# Patient Record
Sex: Male | Born: 1935 | Race: Black or African American | Hispanic: No | Marital: Married | Smoking: Former smoker
Health system: Southern US, Community
[De-identification: ages and names within clinical notes are randomized; demographics above are authoritative.]

## PROBLEM LIST (undated history)

## (undated) DIAGNOSIS — K219 Gastro-esophageal reflux disease without esophagitis: Secondary | ICD-10-CM

## (undated) DIAGNOSIS — R911 Solitary pulmonary nodule: Secondary | ICD-10-CM

## (undated) DIAGNOSIS — Z8619 Personal history of other infectious and parasitic diseases: Secondary | ICD-10-CM

## (undated) DIAGNOSIS — A6001 Herpesviral infection of penis: Secondary | ICD-10-CM

## (undated) DIAGNOSIS — I2510 Atherosclerotic heart disease of native coronary artery without angina pectoris: Secondary | ICD-10-CM

## (undated) DIAGNOSIS — I214 Non-ST elevation (NSTEMI) myocardial infarction: Secondary | ICD-10-CM

## (undated) DIAGNOSIS — N529 Male erectile dysfunction, unspecified: Secondary | ICD-10-CM

## (undated) DIAGNOSIS — J449 Chronic obstructive pulmonary disease, unspecified: Secondary | ICD-10-CM

## (undated) DIAGNOSIS — N183 Chronic kidney disease, stage 3 unspecified: Secondary | ICD-10-CM

## (undated) DIAGNOSIS — M1990 Unspecified osteoarthritis, unspecified site: Secondary | ICD-10-CM

## (undated) DIAGNOSIS — I428 Other cardiomyopathies: Secondary | ICD-10-CM

## (undated) DIAGNOSIS — I5022 Chronic systolic (congestive) heart failure: Secondary | ICD-10-CM

## (undated) DIAGNOSIS — I7121 Aneurysm of the ascending aorta, without rupture: Secondary | ICD-10-CM

## (undated) DIAGNOSIS — F419 Anxiety disorder, unspecified: Secondary | ICD-10-CM

## (undated) DIAGNOSIS — I10 Essential (primary) hypertension: Secondary | ICD-10-CM

## (undated) DIAGNOSIS — F0390 Unspecified dementia without behavioral disturbance: Secondary | ICD-10-CM

## (undated) DIAGNOSIS — C61 Malignant neoplasm of prostate: Secondary | ICD-10-CM

## (undated) DIAGNOSIS — K922 Gastrointestinal hemorrhage, unspecified: Secondary | ICD-10-CM

## (undated) DIAGNOSIS — Z95810 Presence of automatic (implantable) cardiac defibrillator: Secondary | ICD-10-CM

## (undated) DIAGNOSIS — I712 Thoracic aortic aneurysm, without rupture: Secondary | ICD-10-CM

## (undated) HISTORY — DX: Herpesviral infection of penis: A60.01

## (undated) HISTORY — DX: Unspecified dementia, unspecified severity, without behavioral disturbance, psychotic disturbance, mood disturbance, and anxiety: F03.90

## (undated) HISTORY — PX: ANKLE SURGERY: SHX546

## (undated) HISTORY — DX: Anxiety disorder, unspecified: F41.9

## (undated) HISTORY — DX: Other cardiomyopathies: I42.8

## (undated) HISTORY — DX: Chronic obstructive pulmonary disease, unspecified: J44.9

## (undated) HISTORY — DX: Unspecified osteoarthritis, unspecified site: M19.90

## (undated) HISTORY — DX: Male erectile dysfunction, unspecified: N52.9

## (undated) HISTORY — DX: Personal history of other infectious and parasitic diseases: Z86.19

---

## 1998-01-24 ENCOUNTER — Ambulatory Visit (HOSPITAL_BASED_OUTPATIENT_CLINIC_OR_DEPARTMENT_OTHER): Admission: RE | Admit: 1998-01-24 | Discharge: 1998-01-24 | Payer: Self-pay | Admitting: Specialist

## 1999-08-15 LAB — HM COLONOSCOPY: HM Colonoscopy: NORMAL

## 2001-12-29 ENCOUNTER — Encounter: Payer: Self-pay | Admitting: Family Medicine

## 2001-12-29 ENCOUNTER — Ambulatory Visit (HOSPITAL_COMMUNITY): Admission: RE | Admit: 2001-12-29 | Discharge: 2001-12-29 | Payer: Self-pay | Admitting: Family Medicine

## 2001-12-31 ENCOUNTER — Ambulatory Visit (HOSPITAL_COMMUNITY): Admission: RE | Admit: 2001-12-31 | Discharge: 2001-12-31 | Payer: Self-pay | Admitting: Family Medicine

## 2001-12-31 ENCOUNTER — Encounter: Payer: Self-pay | Admitting: Family Medicine

## 2002-01-03 ENCOUNTER — Emergency Department (HOSPITAL_COMMUNITY): Admission: EM | Admit: 2002-01-03 | Discharge: 2002-01-03 | Payer: Self-pay | Admitting: Emergency Medicine

## 2002-01-09 ENCOUNTER — Emergency Department (HOSPITAL_COMMUNITY): Admission: EM | Admit: 2002-01-09 | Discharge: 2002-01-10 | Payer: Self-pay | Admitting: Emergency Medicine

## 2002-06-29 ENCOUNTER — Encounter: Payer: Self-pay | Admitting: Emergency Medicine

## 2002-06-29 ENCOUNTER — Inpatient Hospital Stay (HOSPITAL_COMMUNITY): Admission: EM | Admit: 2002-06-29 | Discharge: 2002-07-02 | Payer: Self-pay | Admitting: Emergency Medicine

## 2002-07-05 ENCOUNTER — Ambulatory Visit (HOSPITAL_COMMUNITY): Admission: RE | Admit: 2002-07-05 | Discharge: 2002-07-05 | Payer: Self-pay | Admitting: Cardiology

## 2003-03-13 ENCOUNTER — Inpatient Hospital Stay (HOSPITAL_COMMUNITY): Admission: EM | Admit: 2003-03-13 | Discharge: 2003-03-15 | Payer: Self-pay | Admitting: Emergency Medicine

## 2003-10-01 ENCOUNTER — Inpatient Hospital Stay (HOSPITAL_COMMUNITY): Admission: EM | Admit: 2003-10-01 | Discharge: 2003-10-04 | Payer: Self-pay | Admitting: Emergency Medicine

## 2003-11-22 ENCOUNTER — Ambulatory Visit: Payer: Self-pay | Admitting: Family Medicine

## 2003-12-13 ENCOUNTER — Ambulatory Visit: Payer: Self-pay | Admitting: *Deleted

## 2004-01-02 ENCOUNTER — Ambulatory Visit: Payer: Self-pay | Admitting: Family Medicine

## 2004-01-27 ENCOUNTER — Ambulatory Visit (HOSPITAL_COMMUNITY): Admission: RE | Admit: 2004-01-27 | Discharge: 2004-01-27 | Payer: Self-pay | Admitting: Family Medicine

## 2004-01-27 ENCOUNTER — Ambulatory Visit: Payer: Self-pay | Admitting: Family Medicine

## 2004-01-31 ENCOUNTER — Ambulatory Visit (HOSPITAL_COMMUNITY): Admission: RE | Admit: 2004-01-31 | Discharge: 2004-01-31 | Payer: Self-pay | Admitting: Family Medicine

## 2004-02-06 ENCOUNTER — Ambulatory Visit: Payer: Self-pay | Admitting: Family Medicine

## 2004-03-07 ENCOUNTER — Ambulatory Visit: Payer: Self-pay | Admitting: Family Medicine

## 2004-03-12 ENCOUNTER — Ambulatory Visit: Payer: Self-pay | Admitting: *Deleted

## 2004-03-14 ENCOUNTER — Ambulatory Visit: Payer: Self-pay | Admitting: *Deleted

## 2004-03-14 ENCOUNTER — Ambulatory Visit (HOSPITAL_COMMUNITY): Admission: RE | Admit: 2004-03-14 | Discharge: 2004-03-14 | Payer: Self-pay | Admitting: *Deleted

## 2004-03-22 ENCOUNTER — Ambulatory Visit: Payer: Self-pay | Admitting: *Deleted

## 2004-04-04 ENCOUNTER — Ambulatory Visit: Payer: Self-pay | Admitting: Family Medicine

## 2004-04-12 ENCOUNTER — Ambulatory Visit: Payer: Self-pay | Admitting: *Deleted

## 2004-04-18 ENCOUNTER — Ambulatory Visit: Payer: Self-pay | Admitting: Family Medicine

## 2004-04-30 ENCOUNTER — Ambulatory Visit (HOSPITAL_COMMUNITY): Admission: RE | Admit: 2004-04-30 | Discharge: 2004-04-30 | Payer: Self-pay | Admitting: Family Medicine

## 2004-05-11 ENCOUNTER — Ambulatory Visit (HOSPITAL_COMMUNITY): Admission: RE | Admit: 2004-05-11 | Discharge: 2004-05-11 | Payer: Self-pay | Admitting: Thoracic Surgery

## 2004-06-06 ENCOUNTER — Ambulatory Visit (HOSPITAL_COMMUNITY): Admission: RE | Admit: 2004-06-06 | Discharge: 2004-06-06 | Payer: Self-pay | Admitting: General Surgery

## 2004-07-19 ENCOUNTER — Ambulatory Visit: Payer: Self-pay | Admitting: Family Medicine

## 2004-08-08 ENCOUNTER — Ambulatory Visit: Payer: Self-pay | Admitting: *Deleted

## 2004-11-12 ENCOUNTER — Ambulatory Visit: Payer: Self-pay | Admitting: Family Medicine

## 2004-11-15 ENCOUNTER — Ambulatory Visit: Payer: Self-pay | Admitting: *Deleted

## 2004-11-26 ENCOUNTER — Ambulatory Visit: Payer: Self-pay | Admitting: Family Medicine

## 2004-11-28 ENCOUNTER — Ambulatory Visit (HOSPITAL_COMMUNITY): Admission: RE | Admit: 2004-11-28 | Discharge: 2004-11-28 | Payer: Self-pay | Admitting: Family Medicine

## 2004-12-31 ENCOUNTER — Ambulatory Visit: Payer: Self-pay | Admitting: Family Medicine

## 2005-01-30 ENCOUNTER — Ambulatory Visit: Payer: Self-pay | Admitting: Family Medicine

## 2005-01-30 ENCOUNTER — Ambulatory Visit (HOSPITAL_COMMUNITY): Admission: RE | Admit: 2005-01-30 | Discharge: 2005-01-30 | Payer: Self-pay | Admitting: Family Medicine

## 2005-01-30 ENCOUNTER — Ambulatory Visit: Payer: Self-pay | Admitting: Internal Medicine

## 2005-02-19 ENCOUNTER — Inpatient Hospital Stay (HOSPITAL_COMMUNITY): Admission: RE | Admit: 2005-02-19 | Discharge: 2005-02-20 | Payer: Self-pay | Admitting: Internal Medicine

## 2005-02-19 ENCOUNTER — Ambulatory Visit: Payer: Self-pay | Admitting: Internal Medicine

## 2005-02-19 HISTORY — PX: INSERTION OF ICD: SHX6689

## 2005-03-11 ENCOUNTER — Ambulatory Visit: Payer: Self-pay

## 2005-03-14 ENCOUNTER — Ambulatory Visit: Payer: Self-pay | Admitting: Family Medicine

## 2005-03-26 ENCOUNTER — Ambulatory Visit: Payer: Self-pay | Admitting: Family Medicine

## 2005-03-29 ENCOUNTER — Ambulatory Visit: Payer: Self-pay | Admitting: Family Medicine

## 2005-04-18 ENCOUNTER — Ambulatory Visit: Payer: Self-pay | Admitting: Family Medicine

## 2005-05-16 ENCOUNTER — Ambulatory Visit: Payer: Self-pay | Admitting: Family Medicine

## 2005-05-17 ENCOUNTER — Ambulatory Visit: Payer: Self-pay | Admitting: Cardiology

## 2005-05-17 ENCOUNTER — Ambulatory Visit: Payer: Self-pay | Admitting: Internal Medicine

## 2005-05-27 ENCOUNTER — Ambulatory Visit: Payer: Self-pay | Admitting: Cardiology

## 2005-05-28 ENCOUNTER — Ambulatory Visit: Payer: Self-pay | Admitting: Internal Medicine

## 2005-05-28 ENCOUNTER — Ambulatory Visit: Payer: Self-pay | Admitting: Cardiology

## 2005-06-11 ENCOUNTER — Ambulatory Visit (HOSPITAL_COMMUNITY): Admission: RE | Admit: 2005-06-11 | Discharge: 2005-06-11 | Payer: Self-pay | Admitting: Thoracic Surgery

## 2005-06-24 ENCOUNTER — Ambulatory Visit: Payer: Self-pay | Admitting: *Deleted

## 2005-07-02 ENCOUNTER — Ambulatory Visit: Payer: Self-pay | Admitting: Internal Medicine

## 2005-07-09 ENCOUNTER — Ambulatory Visit: Payer: Self-pay | Admitting: Family Medicine

## 2005-07-10 ENCOUNTER — Ambulatory Visit (HOSPITAL_COMMUNITY): Admission: RE | Admit: 2005-07-10 | Discharge: 2005-07-10 | Payer: Self-pay | Admitting: Family Medicine

## 2005-07-13 ENCOUNTER — Emergency Department (HOSPITAL_COMMUNITY): Admission: EM | Admit: 2005-07-13 | Discharge: 2005-07-13 | Payer: Self-pay | Admitting: Emergency Medicine

## 2005-08-13 ENCOUNTER — Ambulatory Visit: Payer: Self-pay | Admitting: Family Medicine

## 2005-08-22 ENCOUNTER — Ambulatory Visit: Payer: Self-pay | Admitting: Cardiology

## 2005-08-22 ENCOUNTER — Ambulatory Visit (HOSPITAL_COMMUNITY): Admission: RE | Admit: 2005-08-22 | Discharge: 2005-08-22 | Payer: Self-pay | Admitting: Cardiology

## 2005-08-29 ENCOUNTER — Ambulatory Visit: Payer: Self-pay | Admitting: Cardiology

## 2005-10-08 ENCOUNTER — Ambulatory Visit: Payer: Self-pay | Admitting: Family Medicine

## 2005-10-09 ENCOUNTER — Ambulatory Visit (HOSPITAL_COMMUNITY): Admission: RE | Admit: 2005-10-09 | Discharge: 2005-10-09 | Payer: Self-pay | Admitting: Family Medicine

## 2005-10-17 ENCOUNTER — Ambulatory Visit: Payer: Self-pay | Admitting: Family Medicine

## 2005-10-25 ENCOUNTER — Ambulatory Visit (HOSPITAL_COMMUNITY): Admission: RE | Admit: 2005-10-25 | Discharge: 2005-10-25 | Payer: Self-pay | Admitting: Internal Medicine

## 2005-10-25 ENCOUNTER — Ambulatory Visit: Payer: Self-pay | Admitting: Internal Medicine

## 2005-10-28 ENCOUNTER — Ambulatory Visit: Payer: Self-pay | Admitting: Cardiology

## 2005-10-29 ENCOUNTER — Ambulatory Visit (HOSPITAL_COMMUNITY): Admission: RE | Admit: 2005-10-29 | Discharge: 2005-10-29 | Payer: Self-pay | Admitting: Family Medicine

## 2005-11-07 ENCOUNTER — Ambulatory Visit: Payer: Self-pay | Admitting: Family Medicine

## 2005-11-29 ENCOUNTER — Ambulatory Visit: Payer: Self-pay

## 2005-12-11 ENCOUNTER — Ambulatory Visit: Payer: Self-pay | Admitting: Cardiology

## 2005-12-12 ENCOUNTER — Ambulatory Visit: Payer: Self-pay | Admitting: Internal Medicine

## 2006-03-05 ENCOUNTER — Ambulatory Visit: Payer: Self-pay | Admitting: Internal Medicine

## 2006-03-11 ENCOUNTER — Ambulatory Visit: Payer: Self-pay | Admitting: Family Medicine

## 2006-04-02 ENCOUNTER — Encounter: Payer: Self-pay | Admitting: Family Medicine

## 2006-04-02 LAB — CONVERTED CEMR LAB
ALT: 14 units/L (ref 0–53)
AST: 16 units/L (ref 0–37)
Albumin: 4.1 g/dL (ref 3.5–5.2)
Alkaline Phosphatase: 118 units/L — ABNORMAL HIGH (ref 39–117)
Calcium: 9.6 mg/dL (ref 8.4–10.5)
Chloride: 109 meq/L (ref 96–112)
Cholesterol: 139 mg/dL (ref 0–200)
Creatinine, Ser: 1.1 mg/dL (ref 0.40–1.50)
HCT: 43.3 % (ref 39.0–52.0)
HDL: 31 mg/dL — ABNORMAL LOW (ref >39)
Indirect Bilirubin: 0.7 mg/dL (ref 0.0–0.9)
Lymphocytes Relative: 30 % (ref 12–46)
Lymphs Abs: 2 10*3/uL (ref 0.7–3.3)
Monocytes Relative: 8 % (ref 3–11)
Neutrophils Relative %: 59 % (ref 43–77)
Platelets: 282 10*3/uL (ref 150–400)
Potassium: 4.3 meq/L (ref 3.5–5.3)
RBC: 4.9 M/uL (ref 4.22–5.81)
Sodium: 142 meq/L (ref 135–145)
Total Protein: 7 g/dL (ref 6.0–8.3)
Triglycerides: 156 mg/dL — ABNORMAL HIGH (ref <150)
WBC: 6.8 10*3/uL (ref 4.0–10.5)

## 2006-04-09 ENCOUNTER — Ambulatory Visit: Payer: Self-pay | Admitting: *Deleted

## 2006-04-24 ENCOUNTER — Ambulatory Visit: Payer: Self-pay | Admitting: Cardiology

## 2006-06-18 ENCOUNTER — Ambulatory Visit: Payer: Self-pay | Admitting: Cardiology

## 2006-07-02 ENCOUNTER — Ambulatory Visit: Payer: Self-pay | Admitting: Cardiology

## 2006-07-08 ENCOUNTER — Ambulatory Visit: Payer: Self-pay | Admitting: Cardiology

## 2006-07-14 ENCOUNTER — Emergency Department (HOSPITAL_COMMUNITY): Admission: EM | Admit: 2006-07-14 | Discharge: 2006-07-14 | Payer: Self-pay | Admitting: Emergency Medicine

## 2006-07-16 ENCOUNTER — Ambulatory Visit: Payer: Self-pay | Admitting: Cardiovascular Disease

## 2006-07-23 ENCOUNTER — Ambulatory Visit: Payer: Self-pay | Admitting: Cardiology

## 2006-08-13 ENCOUNTER — Ambulatory Visit: Payer: Self-pay | Admitting: Family Medicine

## 2006-08-20 ENCOUNTER — Ambulatory Visit: Payer: Self-pay | Admitting: Cardiology

## 2006-08-28 ENCOUNTER — Ambulatory Visit: Payer: Self-pay

## 2006-09-02 ENCOUNTER — Ambulatory Visit: Payer: Self-pay | Admitting: Family Medicine

## 2006-09-24 ENCOUNTER — Ambulatory Visit: Payer: Self-pay | Admitting: Cardiology

## 2006-09-29 ENCOUNTER — Ambulatory Visit: Payer: Self-pay | Admitting: Orthopedic Surgery

## 2006-10-22 ENCOUNTER — Ambulatory Visit: Payer: Self-pay | Admitting: Cardiovascular Disease

## 2006-11-26 ENCOUNTER — Ambulatory Visit: Payer: Self-pay | Admitting: Cardiovascular Disease

## 2006-12-03 ENCOUNTER — Ambulatory Visit: Payer: Self-pay | Admitting: Internal Medicine

## 2006-12-04 ENCOUNTER — Ambulatory Visit: Payer: Self-pay | Admitting: Family Medicine

## 2006-12-31 ENCOUNTER — Ambulatory Visit: Payer: Self-pay | Admitting: Cardiology

## 2007-01-19 ENCOUNTER — Encounter: Payer: Self-pay | Admitting: Orthopedic Surgery

## 2007-01-19 ENCOUNTER — Ambulatory Visit (HOSPITAL_COMMUNITY): Admission: RE | Admit: 2007-01-19 | Discharge: 2007-01-19 | Payer: Self-pay | Admitting: Family Medicine

## 2007-01-19 ENCOUNTER — Ambulatory Visit: Payer: Self-pay | Admitting: Family Medicine

## 2007-01-21 ENCOUNTER — Ambulatory Visit: Payer: Self-pay | Admitting: Cardiology

## 2007-02-04 ENCOUNTER — Ambulatory Visit: Payer: Self-pay | Admitting: Orthopedic Surgery

## 2007-02-04 ENCOUNTER — Ambulatory Visit: Payer: Self-pay | Admitting: Cardiology

## 2007-02-05 ENCOUNTER — Encounter: Payer: Self-pay | Admitting: Orthopedic Surgery

## 2007-02-19 ENCOUNTER — Ambulatory Visit: Payer: Self-pay | Admitting: Cardiology

## 2007-03-04 ENCOUNTER — Ambulatory Visit: Payer: Self-pay | Admitting: Cardiology

## 2007-03-10 ENCOUNTER — Telehealth: Payer: Self-pay | Admitting: Orthopedic Surgery

## 2007-03-18 ENCOUNTER — Ambulatory Visit: Payer: Self-pay | Admitting: Cardiology

## 2007-04-14 ENCOUNTER — Ambulatory Visit: Payer: Self-pay | Admitting: Cardiology

## 2007-04-14 ENCOUNTER — Ambulatory Visit: Payer: Self-pay | Admitting: Family Medicine

## 2007-04-22 ENCOUNTER — Encounter: Payer: Self-pay | Admitting: Family Medicine

## 2007-04-22 LAB — CONVERTED CEMR LAB
BUN: 19 mg/dL (ref 6–23)
Basophils Absolute: 0 10*3/uL (ref 0.0–0.1)
Basophils Relative: 0 % (ref 0–1)
Chloride: 107 meq/L (ref 96–112)
Cholesterol: 140 mg/dL (ref 0–200)
Creatinine, Ser: 1.18 mg/dL (ref 0.40–1.50)
Eosinophils Absolute: 0.2 10*3/uL (ref 0.0–0.7)
HDL: 30 mg/dL — ABNORMAL LOW (ref >39)
LDL Cholesterol: 75 mg/dL (ref 0–99)
MCHC: 31.4 g/dL (ref 30.0–36.0)
MCV: 91.7 fL (ref 78.0–100.0)
Monocytes Absolute: 0.5 10*3/uL (ref 0.1–1.0)
Neutro Abs: 3.8 10*3/uL (ref 1.7–7.7)
Neutrophils Relative %: 60 % (ref 43–77)
Potassium: 3.9 meq/L (ref 3.5–5.3)
RDW: 14.9 % (ref 11.5–15.5)
Triglycerides: 173 mg/dL — ABNORMAL HIGH (ref <150)

## 2007-05-05 ENCOUNTER — Ambulatory Visit: Payer: Self-pay | Admitting: Cardiology

## 2007-05-20 ENCOUNTER — Ambulatory Visit: Payer: Self-pay | Admitting: Cardiology

## 2007-06-17 ENCOUNTER — Ambulatory Visit: Payer: Self-pay | Admitting: Cardiology

## 2007-07-14 ENCOUNTER — Ambulatory Visit: Payer: Self-pay | Admitting: Family Medicine

## 2007-07-14 ENCOUNTER — Ambulatory Visit: Payer: Self-pay | Admitting: Cardiovascular Disease

## 2007-07-20 DIAGNOSIS — I428 Other cardiomyopathies: Secondary | ICD-10-CM

## 2007-07-22 ENCOUNTER — Ambulatory Visit: Payer: Self-pay | Admitting: Internal Medicine

## 2007-08-12 ENCOUNTER — Ambulatory Visit: Payer: Self-pay | Admitting: Cardiovascular Disease

## 2007-08-25 ENCOUNTER — Encounter: Payer: Self-pay | Admitting: Family Medicine

## 2007-09-04 ENCOUNTER — Encounter: Payer: Self-pay | Admitting: Family Medicine

## 2007-09-07 ENCOUNTER — Ambulatory Visit: Payer: Self-pay | Admitting: Family Medicine

## 2007-09-09 ENCOUNTER — Ambulatory Visit: Payer: Self-pay | Admitting: Cardiology

## 2007-09-12 ENCOUNTER — Ambulatory Visit: Payer: Self-pay | Admitting: Cardiology

## 2007-09-12 ENCOUNTER — Inpatient Hospital Stay (HOSPITAL_COMMUNITY): Admission: EM | Admit: 2007-09-12 | Discharge: 2007-09-15 | Payer: Self-pay | Admitting: Emergency Medicine

## 2007-09-14 ENCOUNTER — Encounter (INDEPENDENT_AMBULATORY_CARE_PROVIDER_SITE_OTHER): Payer: Self-pay | Admitting: *Deleted

## 2007-09-16 ENCOUNTER — Encounter: Payer: Self-pay | Admitting: Family Medicine

## 2007-09-22 ENCOUNTER — Encounter: Payer: Self-pay | Admitting: Family Medicine

## 2007-09-23 ENCOUNTER — Ambulatory Visit: Payer: Self-pay | Admitting: Cardiology

## 2007-09-24 ENCOUNTER — Ambulatory Visit: Payer: Self-pay | Admitting: Family Medicine

## 2007-09-25 LAB — CONVERTED CEMR LAB
BUN: 35 mg/dL — ABNORMAL HIGH (ref 6–23)
CO2: 24 meq/L (ref 19–32)
Glucose, Bld: 104 mg/dL — ABNORMAL HIGH (ref 70–99)
Potassium: 4.1 meq/L (ref 3.5–5.3)
Sodium: 137 meq/L (ref 135–145)

## 2007-09-29 ENCOUNTER — Encounter: Payer: Self-pay | Admitting: Family Medicine

## 2007-09-30 ENCOUNTER — Ambulatory Visit: Payer: Self-pay | Admitting: Family Medicine

## 2007-09-30 ENCOUNTER — Ambulatory Visit (HOSPITAL_COMMUNITY): Admission: RE | Admit: 2007-09-30 | Discharge: 2007-09-30 | Payer: Self-pay | Admitting: Family Medicine

## 2007-09-30 LAB — CONVERTED CEMR LAB
Basophils Absolute: 0 10*3/uL (ref 0.0–0.1)
Basophils Relative: 1 % (ref 0–1)
Blood in Urine, dipstick: NEGATIVE
CO2: 23 meq/L (ref 19–32)
Calcium: 9.8 mg/dL (ref 8.4–10.5)
Creatinine, Ser: 1.76 mg/dL — ABNORMAL HIGH (ref 0.40–1.50)
Eosinophils Absolute: 0.2 10*3/uL (ref 0.0–0.7)
Eosinophils Relative: 3 % (ref 0–5)
Glucose, Urine, Semiquant: NEGATIVE
HCT: 43.5 % (ref 39.0–52.0)
Hemoglobin: 14.1 g/dL (ref 13.0–17.0)
Lymphocytes Relative: 24 % (ref 12–46)
MCHC: 32.4 g/dL (ref 30.0–36.0)
MCV: 88.1 fL (ref 78.0–100.0)
Monocytes Absolute: 0.5 10*3/uL (ref 0.1–1.0)
Nitrite: NEGATIVE
Platelets: 294 10*3/uL (ref 150–400)
Protein, U semiquant: NEGATIVE
RDW: 14.2 % (ref 11.5–15.5)
Sodium: 140 meq/L (ref 135–145)
Urobilinogen, UA: 0.2
WBC Urine, dipstick: NEGATIVE

## 2007-10-02 ENCOUNTER — Ambulatory Visit (HOSPITAL_COMMUNITY): Admission: RE | Admit: 2007-10-02 | Discharge: 2007-10-02 | Payer: Self-pay | Admitting: Family Medicine

## 2007-10-05 ENCOUNTER — Emergency Department (HOSPITAL_COMMUNITY): Admission: EM | Admit: 2007-10-05 | Discharge: 2007-10-05 | Payer: Self-pay | Admitting: Emergency Medicine

## 2007-10-13 ENCOUNTER — Ambulatory Visit: Payer: Self-pay | Admitting: Family Medicine

## 2007-10-14 LAB — CONVERTED CEMR LAB
CO2: 23 meq/L (ref 19–32)
Chloride: 101 meq/L (ref 96–112)
Creatinine, Ser: 1.51 mg/dL — ABNORMAL HIGH (ref 0.40–1.50)
Sodium: 138 meq/L (ref 135–145)

## 2007-10-21 ENCOUNTER — Ambulatory Visit: Payer: Self-pay | Admitting: Cardiology

## 2007-10-29 ENCOUNTER — Telehealth: Payer: Self-pay | Admitting: Family Medicine

## 2007-11-16 ENCOUNTER — Telehealth: Payer: Self-pay | Admitting: Orthopedic Surgery

## 2007-11-18 ENCOUNTER — Encounter: Payer: Self-pay | Admitting: Family Medicine

## 2007-11-18 ENCOUNTER — Ambulatory Visit: Payer: Self-pay | Admitting: Cardiology

## 2007-11-26 ENCOUNTER — Ambulatory Visit: Payer: Self-pay | Admitting: Orthopedic Surgery

## 2007-11-26 DIAGNOSIS — M19019 Primary osteoarthritis, unspecified shoulder: Secondary | ICD-10-CM | POA: Insufficient documentation

## 2007-12-03 ENCOUNTER — Telehealth: Payer: Self-pay | Admitting: Orthopedic Surgery

## 2007-12-14 ENCOUNTER — Ambulatory Visit: Payer: Self-pay | Admitting: Family Medicine

## 2007-12-17 ENCOUNTER — Ambulatory Visit: Payer: Self-pay | Admitting: Cardiology

## 2007-12-21 ENCOUNTER — Encounter: Payer: Self-pay | Admitting: Orthopedic Surgery

## 2007-12-21 ENCOUNTER — Emergency Department (HOSPITAL_COMMUNITY): Admission: EM | Admit: 2007-12-21 | Discharge: 2007-12-21 | Payer: Self-pay | Admitting: Emergency Medicine

## 2007-12-25 ENCOUNTER — Encounter: Payer: Self-pay | Admitting: Family Medicine

## 2007-12-28 ENCOUNTER — Telehealth: Payer: Self-pay | Admitting: Orthopedic Surgery

## 2007-12-30 ENCOUNTER — Ambulatory Visit: Payer: Self-pay | Admitting: Cardiology

## 2008-01-19 ENCOUNTER — Ambulatory Visit: Payer: Self-pay | Admitting: Family Medicine

## 2008-01-20 ENCOUNTER — Encounter: Payer: Self-pay | Admitting: Family Medicine

## 2008-01-21 LAB — CONVERTED CEMR LAB
BUN: 38 mg/dL — ABNORMAL HIGH (ref 6–23)
CO2: 21 meq/L (ref 19–32)
Chloride: 107 meq/L (ref 96–112)
Potassium: 4.4 meq/L (ref 3.5–5.3)

## 2008-01-27 ENCOUNTER — Ambulatory Visit: Payer: Self-pay | Admitting: Cardiology

## 2008-01-28 ENCOUNTER — Ambulatory Visit (HOSPITAL_COMMUNITY): Payer: Self-pay | Admitting: Psychology

## 2008-02-29 ENCOUNTER — Ambulatory Visit: Payer: Self-pay | Admitting: Cardiology

## 2008-02-29 ENCOUNTER — Ambulatory Visit: Payer: Self-pay

## 2008-03-01 ENCOUNTER — Ambulatory Visit: Payer: Self-pay | Admitting: Cardiology

## 2008-03-03 ENCOUNTER — Ambulatory Visit (HOSPITAL_COMMUNITY): Admission: RE | Admit: 2008-03-03 | Discharge: 2008-03-03 | Payer: Self-pay | Admitting: Family Medicine

## 2008-03-23 ENCOUNTER — Ambulatory Visit: Payer: Self-pay | Admitting: Cardiology

## 2008-03-28 ENCOUNTER — Ambulatory Visit: Payer: Self-pay | Admitting: Orthopedic Surgery

## 2008-04-01 ENCOUNTER — Encounter: Payer: Self-pay | Admitting: Family Medicine

## 2008-04-07 ENCOUNTER — Encounter: Payer: Self-pay | Admitting: Family Medicine

## 2008-04-12 ENCOUNTER — Encounter: Payer: Self-pay | Admitting: Internal Medicine

## 2008-04-13 ENCOUNTER — Encounter: Payer: Self-pay | Admitting: Family Medicine

## 2008-04-25 ENCOUNTER — Ambulatory Visit: Payer: Self-pay | Admitting: Orthopedic Surgery

## 2008-05-03 ENCOUNTER — Encounter: Payer: Self-pay | Admitting: Family Medicine

## 2008-05-16 ENCOUNTER — Ambulatory Visit: Payer: Self-pay | Admitting: Family Medicine

## 2008-05-17 LAB — CONVERTED CEMR LAB
PSA: 3.45 ng/mL (ref 0.10–4.00)
Uric Acid, Serum: 8 mg/dL — ABNORMAL HIGH (ref 4.0–7.8)

## 2008-05-18 ENCOUNTER — Ambulatory Visit: Payer: Self-pay | Admitting: Cardiology

## 2008-05-20 DIAGNOSIS — M109 Gout, unspecified: Secondary | ICD-10-CM

## 2008-06-07 ENCOUNTER — Ambulatory Visit (HOSPITAL_COMMUNITY): Payer: Self-pay | Admitting: Psychology

## 2008-06-07 ENCOUNTER — Encounter (INDEPENDENT_AMBULATORY_CARE_PROVIDER_SITE_OTHER): Payer: Self-pay | Admitting: *Deleted

## 2008-06-15 ENCOUNTER — Ambulatory Visit: Payer: Self-pay | Admitting: Cardiology

## 2008-06-30 ENCOUNTER — Encounter: Payer: Self-pay | Admitting: Orthopedic Surgery

## 2008-07-03 ENCOUNTER — Encounter: Payer: Self-pay | Admitting: Family Medicine

## 2008-07-12 ENCOUNTER — Telehealth: Payer: Self-pay | Admitting: Family Medicine

## 2008-07-13 ENCOUNTER — Ambulatory Visit: Payer: Self-pay | Admitting: Cardiology

## 2008-07-20 ENCOUNTER — Encounter: Payer: Self-pay | Admitting: Internal Medicine

## 2008-07-20 ENCOUNTER — Ambulatory Visit: Payer: Self-pay | Admitting: Internal Medicine

## 2008-07-20 DIAGNOSIS — Z95810 Presence of automatic (implantable) cardiac defibrillator: Secondary | ICD-10-CM

## 2008-07-20 DIAGNOSIS — N62 Hypertrophy of breast: Secondary | ICD-10-CM | POA: Insufficient documentation

## 2008-07-20 HISTORY — DX: Presence of automatic (implantable) cardiac defibrillator: Z95.810

## 2008-07-27 ENCOUNTER — Telehealth: Payer: Self-pay | Admitting: Family Medicine

## 2008-08-04 ENCOUNTER — Encounter: Payer: Self-pay | Admitting: Family Medicine

## 2008-08-10 ENCOUNTER — Ambulatory Visit: Payer: Self-pay | Admitting: Cardiology

## 2008-08-10 ENCOUNTER — Encounter (INDEPENDENT_AMBULATORY_CARE_PROVIDER_SITE_OTHER): Payer: Self-pay | Admitting: *Deleted

## 2008-08-22 ENCOUNTER — Ambulatory Visit (HOSPITAL_COMMUNITY): Payer: Self-pay | Admitting: Psychology

## 2008-08-30 ENCOUNTER — Encounter: Payer: Self-pay | Admitting: Family Medicine

## 2008-09-04 ENCOUNTER — Encounter: Payer: Self-pay | Admitting: Family Medicine

## 2008-09-20 ENCOUNTER — Ambulatory Visit: Payer: Self-pay | Admitting: Family Medicine

## 2008-10-03 ENCOUNTER — Encounter: Payer: Self-pay | Admitting: Cardiology

## 2008-10-04 ENCOUNTER — Encounter: Payer: Self-pay | Admitting: Family Medicine

## 2008-11-15 ENCOUNTER — Telehealth: Payer: Self-pay | Admitting: Family Medicine

## 2008-12-20 ENCOUNTER — Ambulatory Visit: Payer: Self-pay | Admitting: Family Medicine

## 2008-12-20 DIAGNOSIS — G4700 Insomnia, unspecified: Secondary | ICD-10-CM

## 2009-01-02 ENCOUNTER — Ambulatory Visit: Payer: Self-pay | Admitting: Family Medicine

## 2009-01-02 ENCOUNTER — Telehealth: Payer: Self-pay | Admitting: Family Medicine

## 2009-01-04 ENCOUNTER — Encounter: Payer: Self-pay | Admitting: Family Medicine

## 2009-01-18 ENCOUNTER — Telehealth: Payer: Self-pay | Admitting: Family Medicine

## 2009-01-18 LAB — CONVERTED CEMR LAB
AST: 19 units/L (ref 0–37)
Albumin: 4.3 g/dL (ref 3.5–5.2)
Alkaline Phosphatase: 113 units/L (ref 39–117)
HDL: 24 mg/dL — ABNORMAL LOW (ref >39)
RPR Ser Ql: REACTIVE — AB
Total CHOL/HDL Ratio: 5.1
Total Protein: 7.1 g/dL (ref 6.0–8.3)
Triglycerides: 198 mg/dL — ABNORMAL HIGH (ref <150)

## 2009-02-15 ENCOUNTER — Encounter: Payer: Self-pay | Admitting: Family Medicine

## 2009-05-04 ENCOUNTER — Ambulatory Visit: Payer: Self-pay | Admitting: Family Medicine

## 2009-05-09 ENCOUNTER — Telehealth: Payer: Self-pay | Admitting: Family Medicine

## 2009-05-19 LAB — CONVERTED CEMR LAB
Alkaline Phosphatase: 101 units/L (ref 39–117)
BUN: 17 mg/dL (ref 6–23)
Basophils Absolute: 0 10*3/uL (ref 0.0–0.1)
Basophils Relative: 1 % (ref 0–1)
Calcium: 9.2 mg/dL (ref 8.4–10.5)
Chloride: 105 meq/L (ref 96–112)
Cholesterol: 159 mg/dL (ref 0–200)
Creatinine, Ser: 1.34 mg/dL (ref 0.40–1.50)
Eosinophils Absolute: 0.2 10*3/uL (ref 0.0–0.7)
Eosinophils Relative: 4 % (ref 0–5)
HCT: 37.3 % — ABNORMAL LOW (ref 39.0–52.0)
Indirect Bilirubin: 0.6 mg/dL (ref 0.0–0.9)
LDL Cholesterol: 105 mg/dL — ABNORMAL HIGH (ref 0–99)
MCHC: 32.2 g/dL (ref 30.0–36.0)
MCV: 89.4 fL (ref 78.0–100.0)
Platelets: 287 10*3/uL (ref 150–400)
RDW: 14.4 % (ref 11.5–15.5)
Total Protein: 6.7 g/dL (ref 6.0–8.3)
Triglycerides: 107 mg/dL (ref <150)
Vit D, 25-Hydroxy: 23 ng/mL — ABNORMAL LOW (ref 30–89)

## 2009-05-23 ENCOUNTER — Encounter: Payer: Self-pay | Admitting: Family Medicine

## 2009-05-29 ENCOUNTER — Ambulatory Visit (HOSPITAL_COMMUNITY): Admission: RE | Admit: 2009-05-29 | Discharge: 2009-05-29 | Payer: Self-pay | Admitting: Neurology

## 2009-07-24 ENCOUNTER — Ambulatory Visit: Payer: Self-pay | Admitting: Internal Medicine

## 2009-07-26 ENCOUNTER — Encounter: Payer: Self-pay | Admitting: Internal Medicine

## 2009-08-31 ENCOUNTER — Ambulatory Visit: Payer: Self-pay | Admitting: Family Medicine

## 2009-08-31 ENCOUNTER — Inpatient Hospital Stay (HOSPITAL_COMMUNITY): Admission: EM | Admit: 2009-08-31 | Discharge: 2009-09-02 | Payer: Self-pay | Admitting: Emergency Medicine

## 2009-08-31 DIAGNOSIS — J209 Acute bronchitis, unspecified: Secondary | ICD-10-CM

## 2009-09-02 ENCOUNTER — Encounter: Payer: Self-pay | Admitting: Family Medicine

## 2009-09-05 ENCOUNTER — Ambulatory Visit: Payer: Self-pay | Admitting: Cardiology

## 2009-09-05 ENCOUNTER — Inpatient Hospital Stay (HOSPITAL_COMMUNITY): Admission: EM | Admit: 2009-09-05 | Discharge: 2009-09-09 | Payer: Self-pay | Admitting: Emergency Medicine

## 2009-09-06 ENCOUNTER — Encounter: Payer: Self-pay | Admitting: Cardiology

## 2009-09-14 ENCOUNTER — Encounter (INDEPENDENT_AMBULATORY_CARE_PROVIDER_SITE_OTHER): Payer: Self-pay | Admitting: *Deleted

## 2009-09-18 ENCOUNTER — Emergency Department (HOSPITAL_COMMUNITY): Admission: EM | Admit: 2009-09-18 | Discharge: 2009-09-18 | Payer: Self-pay | Admitting: Emergency Medicine

## 2009-09-21 ENCOUNTER — Ambulatory Visit: Payer: Self-pay | Admitting: Family Medicine

## 2009-09-21 ENCOUNTER — Telehealth: Payer: Self-pay | Admitting: Family Medicine

## 2009-09-25 ENCOUNTER — Encounter: Payer: Self-pay | Admitting: Family Medicine

## 2009-09-26 ENCOUNTER — Encounter: Payer: Self-pay | Admitting: Family Medicine

## 2009-09-27 ENCOUNTER — Encounter: Payer: Self-pay | Admitting: Family Medicine

## 2009-09-28 ENCOUNTER — Encounter: Payer: Self-pay | Admitting: Family Medicine

## 2009-09-28 ENCOUNTER — Telehealth: Payer: Self-pay | Admitting: Family Medicine

## 2009-09-28 DIAGNOSIS — J9600 Acute respiratory failure, unspecified whether with hypoxia or hypercapnia: Secondary | ICD-10-CM

## 2009-09-28 LAB — CONVERTED CEMR LAB
CO2: 28 meq/L (ref 19–32)
Chloride: 103 meq/L (ref 96–112)
Creatinine, Ser: 1.45 mg/dL (ref 0.40–1.50)

## 2009-09-29 ENCOUNTER — Encounter: Payer: Self-pay | Admitting: Family Medicine

## 2009-09-30 DIAGNOSIS — G4734 Idiopathic sleep related nonobstructive alveolar hypoventilation: Secondary | ICD-10-CM | POA: Insufficient documentation

## 2009-10-02 ENCOUNTER — Encounter: Payer: Self-pay | Admitting: Family Medicine

## 2009-10-03 ENCOUNTER — Ambulatory Visit (HOSPITAL_COMMUNITY): Admission: RE | Admit: 2009-10-03 | Discharge: 2009-10-03 | Payer: Self-pay | Admitting: Family Medicine

## 2009-10-03 ENCOUNTER — Encounter: Payer: Self-pay | Admitting: Family Medicine

## 2009-10-04 ENCOUNTER — Telehealth: Payer: Self-pay | Admitting: Family Medicine

## 2009-10-05 ENCOUNTER — Encounter: Payer: Self-pay | Admitting: Adult Health

## 2009-10-05 ENCOUNTER — Encounter: Payer: Self-pay | Admitting: Family Medicine

## 2009-10-05 ENCOUNTER — Ambulatory Visit: Payer: Self-pay | Admitting: Cardiology

## 2009-10-19 LAB — CONVERTED CEMR LAB
BUN: 32 mg/dL — ABNORMAL HIGH (ref 6–23)
CO2: 25 meq/L (ref 19–32)
Chloride: 102 meq/L (ref 96–112)
Creatinine, Ser: 1.54 mg/dL — ABNORMAL HIGH (ref 0.40–1.50)

## 2009-10-23 ENCOUNTER — Ambulatory Visit: Payer: Self-pay | Admitting: Family Medicine

## 2009-10-24 ENCOUNTER — Ambulatory Visit: Payer: Self-pay | Admitting: Family Medicine

## 2009-11-14 ENCOUNTER — Encounter: Payer: Self-pay | Admitting: Family Medicine

## 2009-11-28 ENCOUNTER — Ambulatory Visit: Payer: Self-pay | Admitting: Internal Medicine

## 2009-11-28 DIAGNOSIS — I5022 Chronic systolic (congestive) heart failure: Secondary | ICD-10-CM

## 2009-12-01 ENCOUNTER — Encounter: Payer: Self-pay | Admitting: Internal Medicine

## 2009-12-01 ENCOUNTER — Telehealth (INDEPENDENT_AMBULATORY_CARE_PROVIDER_SITE_OTHER): Payer: Self-pay

## 2009-12-12 ENCOUNTER — Encounter: Payer: Self-pay | Admitting: Emergency Medicine

## 2009-12-12 ENCOUNTER — Inpatient Hospital Stay (HOSPITAL_COMMUNITY)
Admission: EM | Admit: 2009-12-12 | Discharge: 2009-12-14 | Payer: Self-pay | Source: Home / Self Care | Admitting: Cardiovascular Disease

## 2009-12-12 ENCOUNTER — Ambulatory Visit: Payer: Self-pay | Admitting: Internal Medicine

## 2009-12-13 LAB — CONVERTED CEMR LAB
BUN: 21 mg/dL (ref 6–23)
Basophils Absolute: 0 10*3/uL (ref 0.0–0.1)
Creatinine, Ser: 1.31 mg/dL (ref 0.40–1.50)
Glucose, Bld: 101 mg/dL — ABNORMAL HIGH (ref 70–99)
Hemoglobin: 12.2 g/dL — ABNORMAL LOW (ref 13.0–17.0)
INR: 1.12 (ref <1.50)
Lymphocytes Relative: 22 % (ref 12–46)
Monocytes Absolute: 0.3 10*3/uL (ref 0.1–1.0)
Monocytes Relative: 6 % (ref 3–12)
Neutro Abs: 3.3 10*3/uL (ref 1.7–7.7)
Potassium: 3.9 meq/L (ref 3.5–5.3)
RBC: 4.27 M/uL (ref 4.22–5.81)
WBC: 4.9 10*3/uL (ref 4.0–10.5)
aPTT: 34 s (ref 24–37)

## 2009-12-14 HISTORY — PX: OTHER SURGICAL HISTORY: SHX169

## 2009-12-20 ENCOUNTER — Encounter: Payer: Self-pay | Admitting: Internal Medicine

## 2009-12-21 LAB — CONVERTED CEMR LAB
BUN: 26 mg/dL — ABNORMAL HIGH (ref 6–23)
Calcium: 9.7 mg/dL (ref 8.4–10.5)
Glucose, Bld: 103 mg/dL — ABNORMAL HIGH (ref 70–99)
Sodium: 141 meq/L (ref 135–145)

## 2009-12-28 ENCOUNTER — Telehealth (INDEPENDENT_AMBULATORY_CARE_PROVIDER_SITE_OTHER): Payer: Self-pay | Admitting: *Deleted

## 2009-12-29 ENCOUNTER — Encounter: Payer: Self-pay | Admitting: Internal Medicine

## 2009-12-29 ENCOUNTER — Ambulatory Visit: Payer: Self-pay | Admitting: Cardiology

## 2009-12-29 DIAGNOSIS — L988 Other specified disorders of the skin and subcutaneous tissue: Secondary | ICD-10-CM

## 2010-01-02 ENCOUNTER — Ambulatory Visit: Payer: Self-pay | Admitting: Internal Medicine

## 2010-01-18 ENCOUNTER — Ambulatory Visit
Admission: RE | Admit: 2010-01-18 | Discharge: 2010-01-18 | Payer: Self-pay | Source: Home / Self Care | Attending: Internal Medicine | Admitting: Internal Medicine

## 2010-01-25 ENCOUNTER — Ambulatory Visit: Admit: 2010-01-25 | Payer: Self-pay | Admitting: Family Medicine

## 2010-02-03 ENCOUNTER — Encounter: Payer: Self-pay | Admitting: Thoracic Surgery

## 2010-02-04 ENCOUNTER — Encounter: Payer: Self-pay | Admitting: Neurology

## 2010-02-04 ENCOUNTER — Encounter: Payer: Self-pay | Admitting: Thoracic Surgery

## 2010-02-04 ENCOUNTER — Encounter: Payer: Self-pay | Admitting: Family Medicine

## 2010-02-07 ENCOUNTER — Other Ambulatory Visit (HOSPITAL_COMMUNITY): Payer: Self-pay | Admitting: Pulmonary Disease

## 2010-02-07 ENCOUNTER — Encounter: Payer: Self-pay | Admitting: Family Medicine

## 2010-02-07 DIAGNOSIS — R1907 Generalized intra-abdominal and pelvic swelling, mass and lump: Secondary | ICD-10-CM

## 2010-02-14 ENCOUNTER — Ambulatory Visit (HOSPITAL_COMMUNITY)
Admission: RE | Admit: 2010-02-14 | Discharge: 2010-02-14 | Disposition: A | Discharge status: Home / Self Care | Payer: Medicare HMO | Source: Ambulatory Visit | Attending: Pulmonary Disease | Admitting: Pulmonary Disease

## 2010-02-14 DIAGNOSIS — R1900 Intra-abdominal and pelvic swelling, mass and lump, unspecified site: Secondary | ICD-10-CM | POA: Insufficient documentation

## 2010-02-14 DIAGNOSIS — R1907 Generalized intra-abdominal and pelvic swelling, mass and lump: Secondary | ICD-10-CM

## 2010-02-14 DIAGNOSIS — R109 Unspecified abdominal pain: Secondary | ICD-10-CM | POA: Insufficient documentation

## 2010-02-14 DIAGNOSIS — Q619 Cystic kidney disease, unspecified: Secondary | ICD-10-CM | POA: Insufficient documentation

## 2010-02-15 NOTE — Miscellaneous (Signed)
Summary: Device change out  Clinical Lists Changes  Observations: Added new observation of ICD IMPL DTE: 12/13/2009 (12/20/2009 12:55) Added new observation of ICD SERL#: [id number] H (12/20/2009 12:55) Added new observation of ICD MODL#: [id number] (12/20/2009 91:47) Added new observation of ICDEXPLCOMM: 12/13/09 Medtronic 7304/[id number] H explanted (12/20/2009 12:55)       ICD Specifications Following MD:  Lewayne Bunting, MD     ICD Vendor:  Medtronic     ICD Model Number:  D314TRG     ICD Serial Number:  [id number] ICD DOI:  12/13/2009     ICD Implanting MD:  Lewayne Bunting, MD  Lead 1:    Location: RA     DOI: 02/19/2005     Model #: 8657     Serial #: [id number]     Status: active Lead 2:    Location: RV     DOI: 02/19/2005     Model #: 4132     Serial #: [id number]     Status: active Lead 3:    Location: LV     DOI: 02/19/2005     Model #: 4194     Serial #: [id number]     Status: active  Indications::  NICM, CHF  Explantation Comments: 12/13/09 Medtronic 4259/[id number] H explanted  ICD Follow Up ICD Dependent:  No      Episodes Coumadin:  Yes  Brady Parameters Mode DDDR     Lower Rate Limit:  60     Upper Rate Limit 130 PAV 150     Sensed AV Delay:  130  Tachy Zones VF:  200     VT:  250     VT1:  171

## 2010-02-15 NOTE — Letter (Signed)
Summary: OVERNIGHT OXIMETRY  OVERNIGHT OXIMETRY   Imported By: Lind Guest 09/28/2009 09:36:10  _____________________________________________________________________  External Attachment:    Type:   Image     Comment:   External Document

## 2010-02-15 NOTE — Letter (Signed)
Summary: med review sheet  med review sheet   Imported By: Rudene Anda 10/24/2009 15:47:16  _____________________________________________________________________  External Attachment:    Type:   Image     Comment:   External Document

## 2010-02-15 NOTE — Cardiovascular Report (Signed)
Summary: Office Visit   Office Visit   Imported By: Roderic Ovens 01/12/2010 16:29:03  _____________________________________________________________________  External Attachment:    Type:   Image     Comment:   External Document

## 2010-02-15 NOTE — Letter (Signed)
Summary: L-ABG  L-ABG   Imported By: Lind Guest 10/04/2009 09:18:20  _____________________________________________________________________  External Attachment:    Type:   Image     Comment:   External Document

## 2010-02-15 NOTE — Procedures (Signed)
Summary: [facility] Device [facility]    Allergies: 1)  ! * Sulfa Drugs 2)  ! Enalapril Maleate   ICD Specifications Following MD:  Lewayne Bunting, MD     ICD Vendor:  Medtronic     ICD Model Number:  D314TRG     ICD Serial Number:  [id number] ICD DOI:  12/13/2009     ICD Implanting MD:  Lewayne Bunting, MD  Lead 1:    Location: RA     DOI: 02/19/2005     Model #: 4098     Serial #: [id number]     Status: active Lead 2:    Location: RV     DOI: 02/19/2005     Model #: 6213     Serial #: [id number]     Status: active Lead 3:    Location: LV     DOI: 02/19/2005     Model #: 4194     Serial #: [id number]     Status: active  Indications::  NICM, CHF  Explantation Comments: 12/13/09 Medtronic 2440/[id number] H explanted  ICD Follow Up Remote Check?  No Battery Voltage:  3.22 V     Charge Time:  8.5 seconds     Underlying rhythm:  SR ICD Dependent:  No       ICD Device Measurements Atrium:  Amplitude: 3.9 mV, Impedance: 475 ohms, Threshold: 0.75 V at 0.4 msec Right Ventricle:  Amplitude: 5.9 mV, Impedance: 551 ohms, Threshold: 0.75 V at 0.6 msec Left Ventricle:  Impedance: 646 ohms, Threshold: 0.25 V at 0.4 msec Shock Impedance: 47/61 ohms   Episodes MS Episodes:  0     Percent Mode Switch:  0     Coumadin:  No Shock:  0     ATP:  0     Nonsustained:  11      Brady Parameters Mode DDDR     Lower Rate Limit:  60     Upper Rate Limit 130 PAV 130     Sensed AV Delay:  120  Tachy Zones VF:  200     VT:  250     VT1:  171     Tech Comments:  Steri strips removed, no redness or edema noted.  There is a small area of concern noted not on the incision line itself.  [name] noted that on her exam of Mr. Asman and is treating that area.  Lead impedance alert values reprogrammed.   Device function normal.  ROV next week in [facility] for a wound check with Dr. Ladona Ridgel. Altha Harm, LPN  December 29, 2009 2:57 PM

## 2010-02-15 NOTE — Assessment & Plan Note (Signed)
Summary: CERTIFICATION   Allergies: 1)  ! * Sulfa Drugs 2)  ! Enalapril Maleate   Complete Medication List: 1)  Carvedilol 25 Mg Tabs (Carvedilol) .... Take one tab by mouth two times a day 2)  Nitroglycerin 0.4 Mg Subl (Nitroglycerin) .... Uad 3)  Furosemide 40 Mg Tabs (Furosemide) .... One tab by mouth bid 4)  Allopurinol 100 Mg Tabs (Allopurinol) .... One tab by mouth qd 5)  Potassium 95 Mg Cr-tabs (Potassium gluconate) .... Take 1 tab daily 6)  Tessalon Perles 100 Mg Caps (Benzonatate) .... Take 1 capsule by mouth two times a day as needed 7)  Spironolactone 25 Mg Tabs (Spironolactone) .... Take 1 tab daily 8)  Combivent 18-103 Mcg/act Aero (Ipratropium-albuterol) .... 2 puffs as needed 9)  Tussionex Pennkinetic Er 10-8 Mg/50ml Lqcr (Hydrocod polst-chlorphen polst) .... Take 5 ml every 12hrs as needed for cough  Other Orders: Re-certification of Home Health [phone])

## 2010-02-15 NOTE — Assessment & Plan Note (Signed)
Summary: SICK THROWING UP   Vital Signs:  Patient profile:   77 year old male Height:      65 inches Weight:      176.25 pounds BMI:     29.44 O2 Sat:      96 % on Room air Pulse rate:   82 / minute Pulse rhythm:   regular Resp:     16 per minute BP sitting:   108 / 70  (left arm)  Vitals Entered By: Adella Hare LPN (August 31, 2009 1:57 PM)  Nutrition Counseling: Patient's BMI is greater than 25 and therefore counseled on weight management options.  O2 Flow:  Room air CC: sob and wheezing Is Patient Diabetic? No Pain Assessment Patient in pain? no        Primary Care Jaeshawn Silvio:  Dr.Margaret Lodema Hong  CC:  sob and wheezing.  History of Present Illness: Pt come sin with a 3 day h/o progressive weakness. He reports cough wheeze and yellow sputum . He reports chills , but no documented fever. He also c.o increased fatigue, pND , orthopnea, incrased fatigue. He reports poor apetite  with some nausea also  Current Medications (verified): 1)  Carvedilol 25 Mg Tabs (Carvedilol) .... Take One Tab By Mouth Two Times A Day 2)  Nitroglycerin 0.4 Mg Subl (Nitroglycerin) .... Uad 3)  Furosemide 40 Mg Tabs (Furosemide) .... One Tab By Mouth Bid 4)  Indomethacin 25 Mg Caps (Indomethacin) .... Take 1 Tablet By Mouth Two Times A Day 5)  Tylenol Extra Strength 500 Mg Tabs (Acetaminophen) .... As Needed 6)  Omeprazole 40 Mg Cpdr (Omeprazole) .... Take 1 Capsule By Mouth Once A Day 7)  Allopurinol 100 Mg Tabs (Allopurinol) .... One Tab By Mouth Qd 8)  Potassium 95 Mg Cr-Tabs (Potassium Gluconate) .... Take 1 Tab Daily  Allergies (verified): 1)  ! * Sulfa Drugs 2)  ! Enalapril Maleate  Review of Systems      See HPI General:  Complains of fatigue and malaise; denies chills and fever. Eyes:  Denies discharge. ENT:  Denies hoarseness, nasal congestion, and sinus pressure. CV:  Complains of chest pain or discomfort, difficulty breathing while lying down, fatigue, lightheadness, and  shortness of breath with exertion; denies swelling of feet and swelling of hands; 3 day h/o increased symptoms of CHF. Resp:  Complains of cough, shortness of breath, sputum productive, and wheezing; 3 day h/o cough with wheeze, dyspnea and at times yellow sputum . GI:  Complains of nausea and vomiting. GU:  Denies dysuria and urinary frequency. Psych:  Complains of anxiety. Allergy:  Denies hives or rash and itching eyes.  Physical Exam  General:  Ill appearing male, anxious and dyspnoeic. HEENT: No facial asymmetry,  EOMI, No sinus tenderness, TM's Clear, oropharynx  pink and moist.   Chest: dcereased air entry, bilateral wheezes and a few crackles in both bases CVS: S1, S2, No murmurs, No S3.   Abd: Soft, Nontender.  MS: Adequate ROM spine, hips, shoulders and knees.  Ext:one plus  edema.   CNS: CN 2-12 intact, power tone and sensation normal throughout.   Skin: Intact, no visible lesions or rashes.  Psych: Good eye contact, normal affect.  Memory intact,  anxious  appearing.    Impression & Recommendations:  Problem # 1:  ACUTE BRONCHITIS (ICD-466.0) Assessment Comment Only  Orders: Albuterol Sulfate Sol 1mg  unit dose (Z6109) Ipratropium inhalation sol. unit dose (U0454) Nebulizer Tx (09811)  Problem # 2:  CARDIOMYOPATHY (ICD-425.4) Assessment: Deteriorated pt  taken to Ed for further eval, I suspect that he is in heart failure and will need hospitalisation  Complete Medication List: 1)  Carvedilol 25 Mg Tabs (Carvedilol) .... Take one tab by mouth two times a day 2)  Nitroglycerin 0.4 Mg Subl (Nitroglycerin) .... Uad 3)  Furosemide 40 Mg Tabs (Furosemide) .... One tab by mouth bid 4)  Indomethacin 25 Mg Caps (Indomethacin) .... Take 1 tablet by mouth two times a day 5)  Tylenol Extra Strength 500 Mg Tabs (Acetaminophen) .... As needed 6)  Omeprazole 40 Mg Cpdr (Omeprazole) .... Take 1 capsule by mouth once a day 7)  Allopurinol 100 Mg Tabs (Allopurinol) .... One tab by  mouth qd 8)  Potassium 95 Mg Cr-tabs (Potassium gluconate) .... Take 1 tab daily  Patient Instructions: 1)  F/U in 7 to 10 days 2)  you will be taken to the ED for further eval, I have spoken to the Doc    I am concerned about your heart as well as the bronchitis.  We will takke you dierectly across to the Ed , I have spoken with the doc.  Medication Administration  Medication # 1:    Medication: Albuterol Sulfate Sol 1mg  unit dose    Diagnosis: ACUTE BRONCHITIS (ICD-466.0)    Dose: 2.5mg     Route: inhaled    Exp Date: 8/12    Lot #: [id number]    Mfr: nephron pharm    Patient tolerated medication without complications    Given by: Adella Hare LPN (August 31, 2009 2:43 PM)  Medication # 2:    Medication: Ipratropium inhalation sol. unit dose    Diagnosis: ACUTE BRONCHITIS (ICD-466.0)    Dose: 0.5mg     Route: inhaled    Exp Date: 10/12    Lot #: [id number]    Mfr: nephron pharm    Patient tolerated medication without complications    Given by: Adella Hare LPN (August 31, 2009 2:44 PM)  Orders Added: 1)  Est. Patient Level IV [95621] 2)  Albuterol Sulfate Sol 1mg  unit dose [J7613] 3)  Ipratropium inhalation sol. unit dose [J7644] 4)  Nebulizer Tx [phone]

## 2010-02-15 NOTE — Assessment & Plan Note (Signed)
Summary: post hosp [name] Macias per pt/tg  Medications Added SPIRONOLACTONE 25 MG TABS (SPIRONOLACTONE) take 1 tab daily COMBIVENT 18-103 MCG/ACT AERO (IPRATROPIUM-ALBUTEROL) 2 puffs as needed TUSSIONEX PENNKINETIC ER 10-8 MG/5ML LQCR (HYDROCOD POLST-CHLORPHEN POLST) take 5 ml every 12hrs as needed for cough      Allergies Added:   Visit Type:  Follow-up Primary [name] Macias:  Dr.Margaret Lodema Hong  CC:  patient has a cough sputum is clear.  History of Present Illness: [name] Macias is a very pleasant 77 y/o AAM with history of NICM with EF of 15%, COPD, Hypertension, and NSVT s/p ICD pacemaker Medronic Maximo placed in 2007.  He was recently admitted with acute hybercarbic respiratory failure and decompensated CHF on 09/05/2009.  He was seen on consultation by Dr. Diona Browner while hospitalized.  He was seen in the ER 09/18/2009  for weakness and hypotension.  Review of those records demonstrates BP of 76/47.  His lisinopril was discontinued and the lasix was decreased to 40mg  two times a day. Creatnine was found to be 2.1 with baseline creatnine of 1.5 to 1.8 on further review of records.  Since that time he has been feeling better and has recently been placed on home O2 by Dr.Simpson. for HS use.  He denies shortness of breath but complains of a cough that sometimes becomes so severe that he loses his breath and gets "worn out."  This occurs mostly in the evening and at bedtime.  Usually after using his inhalers. Otherwise he is without complaint.  Current Medications (verified): 1)  Carvedilol 25 Mg Tabs (Carvedilol) .... Take One Tab By Mouth Two Times A Day 2)  Nitroglycerin 0.4 Mg Subl (Nitroglycerin) .... Uad 3)  Furosemide 40 Mg Tabs (Furosemide) .... One Tab By Mouth Bid 4)  Allopurinol 100 Mg Tabs (Allopurinol) .... One Tab By Mouth Qd 5)  Potassium 95 Mg Cr-Tabs (Potassium Gluconate) .... Take 1 Tab Daily 6)  Tessalon Perles 100 Mg Caps (Benzonatate) .... Take 1 Capsule By Mouth Two Times A Day As  Needed 7)  Spironolactone 25 Mg Tabs (Spironolactone) .... Take 1 Tab Daily 8)  Combivent 18-103 Mcg/act Aero (Ipratropium-Albuterol) .... 2 Puffs As Needed 9)  Tussionex Pennkinetic Er 10-8 Mg/98ml Lqcr (Hydrocod Polst-Chlorphen Polst) .... Take 5 Ml Every 12hrs As Needed For Cough  Allergies (verified): 1)  ! * Sulfa Drugs 2)  ! Enalapril Maleate PMH-FH-SH reviewed-no changes except otherwise noted  Review of Systems       The patient complains of prolonged cough.         All other systems have been reviewed and are negative unless stated above.   Vital Signs:  Patient profile:   77 year old male Weight:      162 pounds Pulse rate:   77 / minute BP sitting:   111 / 69  (right arm)  Vitals Entered By: [name] Saa, CNA (October 05, 2009 2:42 PM)  Physical Exam  General:  Well developed, well nourished, in no acute distress. Head:  normocephalic and atraumatic Eyes:  Wears glasses Mouth:  No redness or throat injection Lungs:  Clear bilaterally to auscultation and percussion. Heart:  Distant withour murmurs or gallops Abdomen:  Bowel sounds positive; abdomen soft and non-tender without masses, organomegaly, or hernias noted. No hepatosplenomegaly. Msk:  Back normal, normal gait. Muscle strength and tone normal. Pulses:  pulses normal in all 4 extremities Extremities:  trace left pedal edema and trace right pedal edema.   Neurologic:  Alert and oriented x 3.  Psych:  Normal affect.    ICD Specifications Following MD:  Lewayne Bunting, MD     ICD Vendor:  Medtronic     ICD Model Number:  7304     ICD Serial Number:  [id number] ICD DOI:  02/19/2005     ICD Implanting MD:  Lewayne Bunting, MD  Lead 1:    Location: RA     DOI: 02/19/2005     Model #: 4098     Serial #: [id number]     Status: active Lead 2:    Location: RV     DOI: 02/19/2005     Model #: 6213     Serial #: [id number]     Status: active Lead 3:    Location: LV     DOI: 02/19/2005     Model #: 4194     Serial #:  [id number]     Status: active  Indications::  NICM, CHF   ICD Follow Up ICD Dependent:  No      Episodes Coumadin:  Yes  Brady Parameters Mode DDDR     Lower Rate Limit:  60     Upper Rate Limit 130 PAV 150     Sensed AV Delay:  130  Tachy Zones VF:  200     VT:  250     VT1:  171     Impression & Recommendations:  Problem # 1:  CARDIOMYOPATHY (ICD-425.4) He appears well compensated on this visit with no evidence of lung congestion or edema.  I have reviewed his medications for medicine induced cough.  Lisinopril has been discontinued for 2 weeks now.  I have provided him a Rx for Tussonex 5ml Q 12hrs as needed cough.  I have also advised him to rinse his mouth and throat with water after using his inhalers.   Will check a BMET and follow in one month to evaluate status.  He is reminded about a low salt diet and daily wts. His updated medication list for this problem includes:    Carvedilol 25 Mg Tabs (Carvedilol) .Marland Kitchen... Take one tab by mouth two times a day    Nitroglycerin 0.4 Mg Subl (Nitroglycerin) ..... Uad    Furosemide 40 Mg Tabs (Furosemide) ..... One tab by mouth bid    Spironolactone 25 Mg Tabs (Spironolactone) .Marland Kitchen... Take 1 tab daily  Future Orders: T-Basic Metabolic Panel [phone]) ... 10/12/2009  Patient Instructions: 1)  Your physician recommends that you schedule a follow-up appointment in: 1 monht 2)  Your physician recommends that you return for lab work in: 1 week 3)  Your physician has recommended you make the following change in your medication: tussines 5ml every 12hrs as needed for cough Prescriptions: TUSSIONEX PENNKINETIC ER 10-8 MG/5ML LQCR (HYDROCOD POLST-CHLORPHEN POLST) take 5 ml every 12hrs as needed for cough  #120 x 0   Entered by:   Teressa Lower RN   Authorized by:   [name], NP   Signed by:   Teressa Lower RN on 10/05/2009   Method used:   Print then Give to Patient   RxID:   [id number]   Appended Document: post hosp [name] Macias per pt/tg Patient of Dr. Dietrich Pates and Dr. Ladona Ridgel, seen independently by Ms. Lawrence today.

## 2010-02-15 NOTE — Progress Notes (Signed)
  Phone Note From Pharmacy   Summary of Call: Tammie from [facility] called and said they faxed the results of the overnight pulse oximetry and never heard anything back on it. It was scanned in chart on 9/12. Tammie [phone] Initial call taken by: Everitt Amber LPN,  September 28, 2009 10:50 AM  Follow-up for Phone Call        pls let her know i receivedthem, he needs further eval before i prescribe oxygen.  pls tell mr Bankert and order room air aBG to be drawn, results to also go to Dr Juanetta Gosling, pls fax over the pulse ox report to Dr Juanetta Gosling also. I spoke with him today and Mr kedzierski will need tobe evaluated by him ber ehe starts supplemental oxygen, I will send in a referral. i am alsowriting a note to send with the oximetry rept to Dr Juanetta Gosling Follow-up by: Syliva Overman MD,  September 28, 2009 12:05 PM  Additional Follow-up for Phone Call Additional follow up Details #1::        i do not know what parts of this message have been completed Additional Follow-up by: Adella Hare LPN,  September 29, 2009 1:37 PM    Additional Follow-up for Phone Call Additional follow up Details #2::    I need you to order ABG on rx pad and he is aware to come pick it up to take to hospital to respiratory. He will come either tomorrow or next day. Already faxed info to [facility]. You can put it on my desk, thanks Follow-up by: Everitt Amber LPN,  October 02, 2009 4:30 PM   Appended Document:  written will leave at front desk

## 2010-02-15 NOTE — Letter (Signed)
Summary: DR. Juanetta Gosling  DR. HAWKINS   Imported By: Lind Guest 10/18/2009 08:41:46  _____________________________________________________________________  External Attachment:    Type:   Image     Comment:   External Document

## 2010-02-15 NOTE — Assessment & Plan Note (Signed)
Summary: office visit   Vital Signs:  Patient profile:   77 year old male Height:      65 inches Weight:      170.25 pounds BMI:     28.43 O2 Sat:      98 % on Room air Pulse rate:   75 / minute Pulse rhythm:   regular Resp:     16 per minute BP sitting:   110 / 70  (left arm)  Vitals Entered By: Mauricia Area, CMA  Nutrition Counseling: Patient's BMI is greater than 25 and therefore counseled on weight management options.  O2 Flow:  Room air CC: follow up   Primary Care Provider:  Dr.Tinamarie Przybylski Lodema Hong  CC:  follow up.  History of Present Illness: Reports  that he is doing much better than he had i therecent past. Denies recent fever or chills. Denies sinus pressure, nasal congestion , ear pain or sore throat. Denies chest congestion, or cough productive of sputum. Denies chest pain, palpitations, PND, orthopnea or leg swelling. Denies abdominal pain, nausea, vomitting, diarrhea or constipation. Denies change in bowel movements or bloody stool. Denies dysuria , frequency, incontinence or hesitancy. Denies  joint pain, swelling, or reduced mobility. Denies headaches, vertigo, seizures. Deniesuncontrolled  depression or  anxiety , he is experincing  insomnia, and requests medication for this. Denies  rash, lesions, or itch.     Allergies (verified): 1)  ! * Sulfa Drugs 2)  ! Enalapril Maleate  Review of Systems      See HPI General:  Complains of fatigue and sleep disorder. Eyes:  Denies blurring and discharge. CV:  Complains of shortness of breath with exertion; denies chest pain or discomfort, palpitations, and swelling of feet. Resp:  Complains of shortness of breath; denies cough. GU:  Complains of erectile dysfunction. MS:  Complains of joint pain and stiffness; mild to moderate. Neuro:  Complains of memory loss. Psych:  Complains of anxiety; denies depression and mental problems. Endo:  Denies cold intolerance, excessive hunger, excessive thirst, and  excessive urination. Heme:  Denies abnormal bruising and enlarge lymph nodes. Allergy:  Denies hives or rash and itching eyes.  Physical Exam  General:  Well-developed,well-nourished,in no acute distress; alert,appropriate and cooperative throughout examination HEENT: No facial asymmetry,  EOMI, No sinus tenderness, TM's Clear, oropharynx  pink and moist.   Chest: Clear to auscultation bilaterally. dectreased air entrythroughout. CVS: S1, S2, No murmurs, No S3.   Abd: Soft, Nontender.  MS: decreased  ROM spine, hips, shoulders and knees.  Ext: No edema.   CNS: CN 2-12 intact, power tone and sensation normal throughout.   Skin: Intact, no visible lesions or rashes.  Psych: Good eye contact, normal affect.  Memory fair, not anxious or depressed appearing.    Impression & Recommendations:  Problem # 1:  SLEEP RELATED HYPOVENTILATION/HYPOXEMIA CCE (ICD-327.26) Assessment Improved pt now on nocturnal oxygen  Problem # 2:  CARDIOMYOPATHY (ICD-425.4) Assessment: Comment Only followed closely by cardiology  Problem # 3:  INSOMNIA (ICD-780.52) Assessment: Deteriorated  His updated medication list for this problem includes:    Temazepam 15 Mg Caps (Temazepam) .Marland Kitchen... Take 1 capsule by mouth at bedtime  Discussed sleep hygiene.   Complete Medication List: 1)  Carvedilol 25 Mg Tabs (Carvedilol) .... Take one tab by mouth two times a day 2)  Nitroglycerin 0.4 Mg Subl (Nitroglycerin) .... Uad 3)  Furosemide 40 Mg Tabs (Furosemide) .... One tab by mouth bid 4)  Allopurinol 100 Mg Tabs (Allopurinol) .... One  tab by mouth qd 5)  Potassium 95 Mg Cr-tabs (Potassium gluconate) .... Take 1 tab daily 6)  Spironolactone 25 Mg Tabs (Spironolactone) .... Take 1 tab daily 7)  Combivent 18-103 Mcg/act Aero (Ipratropium-albuterol) .... 2 puffs as needed 8)  Tussionex Pennkinetic Er 10-8 Mg/68ml Lqcr (Hydrocod polst-chlorphen polst) .... Take 5 ml every 12hrs as needed for cough 9)  Temazepam 15 Mg  Caps (Temazepam) .... Take 1 capsule by mouth at bedtime  Patient Instructions: 1)  Please schedule a follow-up appointment in 3 months. 2)  You will start new med for sleep, pls ensure you follow good sleep hygiene. 3)  I am happy you are so much better. 4)  Pls continue the oxygen at night Prescriptions: TEMAZEPAM 15 MG CAPS (TEMAZEPAM) Take 1 capsule by mouth at bedtime  #90 x 1   Entered and Authorized by:   Syliva Overman MD   Signed by:   Syliva Overman MD on 10/24/2009   Method used:   Printed then faxed to ...       [facility]  [address] [phone]* (retail)       [address]       [place], [place]  [address]       Ph: [phone] or [phone]       Fax: [phone]   RxID:   [id number]

## 2010-02-15 NOTE — Assessment & Plan Note (Signed)
Summary: 1 yr f/u per checkout on 07/20/08/tg  Medications Added POTASSIUM 95 MG CR-TABS (POTASSIUM GLUCONATE) take 1 tab daily      Allergies Added:   Visit Type:  Follow-up Primary Provider:  Dr.Margaret Lodema Hong  CC:  no cardiology complaints.  History of Present Illness: Mr Nurse returns today for ICD followup.  He has a dilated CM and EF 25% with class 3, now Class 2 CHF.  He admits to some dietary indiscretion with sodium but is trying to improve.  Despite this, he denies c/p or sob.  No ICD therapies.  No peripheral edema.  Current Medications (verified): 1)  Carvedilol 25 Mg Tabs (Carvedilol) .... Take One Tab By Mouth Two Times A Day 2)  Nitroglycerin 0.4 Mg Subl (Nitroglycerin) .... Uad 3)  Furosemide 40 Mg Tabs (Furosemide) .... One Tab By Mouth Bid 4)  Indomethacin 25 Mg Caps (Indomethacin) .... Take 1 Tablet By Mouth Two Times A Day 5)  Tylenol Extra Strength 500 Mg Tabs (Acetaminophen) .... As Needed 6)  Omeprazole 40 Mg Cpdr (Omeprazole) .... Take 1 Capsule By Mouth Once A Day 7)  Allopurinol 100 Mg Tabs (Allopurinol) .... One Tab By Mouth Qd 8)  Potassium 95 Mg Cr-Tabs (Potassium Gluconate) .... Take 1 Tab Daily  Allergies (verified): 1)  ! * Sulfa Drugs 2)  ! Enalapril Maleate  Past History:  Past Medical History: Last updated: 05/04/2009 arthritis gout htn Non-Ischemic CARDIOMYOPATHY (ICD-425.4) ERECTILE DYSFUNCTION (ICD-302.72) GERD (ICD-530.81) GENERALIZED ANXIETY DISORDER (ICD-300.02) HYPERTENSION (ICD-401.9) DEMENTIA, MILD (ICD-294.8) ARTHRITIS, LEFT FOOT (ICD-716.97) Non-Obstructive CAD WARCEF Trial C O P D      A.  PFT's 10/29/2005  Syphillis h/o hSv type 2  Past Surgical History: Last updated: 05/04/2009 left ankle (not done by Dr. Romeo Apple) s/p Biventricular Pacemaker/AICD implantation 02/19/2005 defibrillator implanted  Review of Systems  The patient denies chest pain, syncope, dyspnea on exertion, and peripheral edema.    Vital  Signs:  Patient profile:   77 year old male Weight:      170 pounds Pulse rate:   74 / minute BP sitting:   107 / 69  (right arm)  Vitals Entered By: Dreama Saa, CNA (July 24, 2009 2:47 PM)  Physical Exam  General:  Well-developed,well-nourished,in no acute distress; alert,appropriate and cooperative throughout examination HEENT: No facial asymmetry, edentulous EOMI, No sinus tenderness, TM's Clear, oropharynx  pink and moist.   Chest: Clear to auscultation bilaterally.  CVS: S1, S2, No murmurs, No S3.   Abd: Soft, Nontender.  MS: Adequate ROM spine, hips, shoulders and knees.  Ext: No edema.   CNS: CN 2-12 intact, power tone and sensation normal throughout.   Skin: Intact, hypopigmented scarring on tip of penis  Psych: Good eye contact, normal affect.  Memory intact, not anxious or depressed appearing.     ICD Specifications Following MD:  Lewayne Bunting, MD     ICD Vendor:  Medtronic     ICD Model Number:  7304     ICD Serial Number:  [id number] ICD DOI:  02/19/2005     ICD Implanting MD:  Lewayne Bunting, MD  Lead 1:    Location: RA     DOI: 02/19/2005     Model #: [id number]     Serial #: [id number]     Status: active Lead 2:    Location: RV     DOI: 02/19/2005     Model #: [id number]     Serial #: [id number]     Status: active Lead  3:    Location: LV     DOI: 02/19/2005     Model #: [id number]     Serial #: [id number]     Status: active  Indications::  NICM, CHF   ICD Follow Up Remote Check?  No Battery Voltage:  2.64 V     Charge Time:  9.95 seconds     Underlying rhythm:  SR ICD Dependent:  No       ICD Device Measurements Atrium:  Amplitude: 3.3 mV, Impedance: 592 ohms, Threshold: 1.0 V at 0.2 msec Right Ventricle:  Amplitude: 16.1 mV, Impedance: 576 ohms, Threshold: 1.0 V at 0.2 msec Left Ventricle:  Impedance: 384 ohms, Threshold: 1.0 V at 0.4 msec Shock Impedance: 53/65 ohms   Episodes MS Episodes:  0     Percent Mode Switch:  0     Coumadin:  Yes Shock:  0     ATP:  0      Nonsustained:  10     Atrial Pacing:  25.7%     Ventricular Pacing:  98.9%  Brady Parameters Mode DDDR     Lower Rate Limit:  60     Upper Rate Limit 130 PAV 150     Sensed AV Delay:  130  Tachy Zones VF:  200     VT:  250     VT1:  171     Next Cardiology Appt Due:  10/14/2009 Tech Comments:  No parameter changes.  Device function normal.  6949 lead stable, SIC 1.  Updated letter and magnet given to patient along with alert tones demonstrated.  ROV 3 months RDS clinic. Altha Harm, LPN  July 24, 2009 3:29 PM  MD Comments:  Agree with above.  Impression & Recommendations:  Problem # 1:  AUTOMATIC IMPLANTABLE CARDIAC DEFIBRILLATOR SITU (ICD-V45.02) His device is working normally.  Will recheck in several months.  Problem # 2:  HYPERTENSION (ICD-401.9) I have asked him to maintain a low sodium diet.  He has had trouble with the expense of epleronone and I have asked him to stop taking it. The following medications were removed from the medication list:    Eplerenone 50 Mg Tabs (Eplerenone) .Marland Kitchen... Take 1 tablet daily    Spironolactone 25 Mg Tabs (Spironolactone) .Marland Kitchen... Take 1 tablet by mouth once a day His updated medication list for this problem includes:    Carvedilol 25 Mg Tabs (Carvedilol) .Marland Kitchen... Take one tab by mouth two times a day    Furosemide 40 Mg Tabs (Furosemide) ..... One tab by mouth bid  Patient Instructions: 1)  Your physician recommends that you schedule a follow-up appointment in: 1 year 2)  Your physician has recommended you make the following change in your medication: Stop taking Spironolatone

## 2010-02-15 NOTE — Miscellaneous (Signed)
Summary: advanced home  advanced home   Imported By: Lind Guest 09/27/2009 08:57:36  _____________________________________________________________________  External Attachment:    Type:   Image     Comment:   External Document

## 2010-02-15 NOTE — Letter (Signed)
Summary: Implantable Device Instructions  [facility] [place]  [address], [place] [address]   Phone: [phone]  Fax: [phone]      Implantable Device Instructions  You are scheduled for:  _____ Permanent Transvenous Pacemaker _____ Implantable Cardioverter Defibrillator _____ Implantable Loop Recorder __X___ Generator Change  on _11-30-11___ with Dr. _Taylor____.  1.  Please arrive at the Short Stay Center at [facility] at _10:00____ on the day of your procedure.  2.  Do not eat or drink after midnight the night before your procedure.  3.  Complete lab work on _11-23-11____.    4.  Do NOT take these medications the morning of your procedure:  Furosemide  5.  Plan for an overnight stay.  Bring your insurance cards and a list of your medications.  6.  Wash your chest and neck with antibacterial soap (any brand) the evening before and the morning of your procedure.  Rinse well.   *If you have ANY questions after you get home, please call the office [phone].  *Every attempt is made to prevent procedures from being rescheduled.  Due to the nauture of Electrophysiology, rescheduling can happen.  The physician is always aware and directs the staff when this occurs.

## 2010-02-15 NOTE — Assessment & Plan Note (Signed)
Summary: office visit   Vital Signs:  Patient profile:   77 year old male Height:      65 inches Weight:      168 pounds O2 Sat:      97 % on Room air Pulse rate:   73 / minute Pulse rhythm:   regular Resp:     16 per minute BP sitting:   100 / 60  (right arm)  Vitals Entered By: Adella Hare LPN (September 21, 2009 3:22 PM)  O2 Flow:  Room air CC: bp dropping Is Patient Diabetic? No Pain Assessment Patient in pain? no        Primary Care Provider:  Dr.Margaret Lodema Hong  CC:  bp dropping.  History of Present Illness: Pt has had repeated hospitalisations very ill with cardiac and pulmonary decompensation, he had to go to the ED earlier this week, because hiis BP was too low.At that visit , he was noted to be dehydrated, and the lasix dose reduced,  and was told to stop the lisinopril He is still experiencing fatigue and poor exercise tolerance. the pt reports he does feel slightly better than he had been , but is aware that he has a long way to go. he denies chest pain or palpitations. He denies any recent fever or chills, sinus pressure or productive cough. His apetite is fair , and his bowels move regularly.     Current Medications (verified): 1)  Carvedilol 25 Mg Tabs (Carvedilol) .... Take One Tab By Mouth Two Times A Day 2)  Nitroglycerin 0.4 Mg Subl (Nitroglycerin) .... Uad 3)  Furosemide 40 Mg Tabs (Furosemide) .... One Tab By Mouth Bid 4)  Allopurinol 100 Mg Tabs (Allopurinol) .... One Tab By Mouth Qd 5)  Potassium 95 Mg Cr-Tabs (Potassium Gluconate) .... Take 1 Tab Daily  Allergies (verified): 1)  ! * Sulfa Drugs 2)  ! Enalapril Maleate  Review of Systems      See HPI General:  Complains of fatigue and malaise; denies chills and fever. Eyes:  Denies discharge and red eye. ENT:  Denies hoarseness, nasal congestion, sinus pressure, and sore throat. CV:  Complains of difficulty breathing at night, difficulty breathing while lying down, fatigue,  lightheadness, and shortness of breath with exertion; denies palpitations and swelling of feet. Resp:  Complains of shortness of breath and wheezing; denies cough and sputum productive; occasinal wheezing. GI:  Denies abdominal pain, constipation, diarrhea, nausea, and vomiting. GU:  Complains of erectile dysfunction; denies dysuria, urinary frequency, and urinary hesitancy. MS:  Complains of joint pain and stiffness. Derm:  Denies itching, lesion(s), and rash. Neuro:  Complains of weakness; denies headaches, seizures, and sensation of room spinning. Psych:  Complains of anxiety and depression; denies mental problems, panic attacks, sense of great danger, suicidal thoughts/plans, thoughts of violence, and unusual visions or sounds. Endo:  Denies excessive thirst and excessive urination. Heme:  Denies abnormal bruising and bleeding. Allergy:  Complains of seasonal allergies.  Physical Exam  General:  Chronically ill appearing male, looks bettrer than at last visit.adequately hydrated. HEENT: No facial asymmetry,  EOMI, No sinus tenderness, TM's Clear, oropharynx  pink and moist. No jVD , no adenopathy  Chest: Clear to auscultation bilaterally. Decreased air entry bilaterally CVS: S1, S2, No murmurs, No S3.   Abd: Soft, Nontender.  MS: Adequate ROM spine, hips, shoulders and knees.  Ext: No edema.   CNS: CN 2-12 intact, power tone and sensation normal throughout.   Skin: Intact, no visible lesions  or rashes.  Psych: Good eye contact, normal affect.  Memory intact, not anxious or depressed appearing.    Impression & Recommendations:  Problem # 1:  ACUTE BRONCHITIS (ICD-466.0) Assessment Improved  His updated medication list for this problem includes:    Tessalon Perles 100 Mg Caps (Benzonatate) .Marland Kitchen... Take 1 capsule by mouth two times a day as needed  Problem # 2:  HYPERTENSION (ICD-401.9) Assessment: Unchanged  His updated medication list for this problem includes:    Carvedilol  25 Mg Tabs (Carvedilol) .Marland Kitchen... Take one tab by mouth two times a day    Furosemide 40 Mg Tabs (Furosemide) ..... One tab by mouth bid  BP today: 100/60 Prior BP: 108/70 (08/31/2009)  Labs Reviewed: K+: 3.5 (05/18/2009) Creat: : 1.34 (05/18/2009)   Chol: 159 (05/18/2009)   HDL: 33 (05/18/2009)   LDL: 105 (05/18/2009)   TG: 107 (05/18/2009)  Orders: T-Basic Metabolic Panel [phone])  Problem # 3:  SLEEP RELATED HYPOVENTILATION/HYPOXEMIA CCE (ICD-327.26) Assessment: Deteriorated pt has esrtablished hypoxia during sleep , would qualify him for supplemental oxygen, however, in light of recenty hypercarbia, I will defer th decision making to pulmonary.  Problem # 4:  CARDIOMYOPATHY (ICD-425.4) Assessment: Deteriorated pt encouraged to follow salt and fluid restrictions closely, and to keep f/u with cardiology  Complete Medication List: 1)  Carvedilol 25 Mg Tabs (Carvedilol) .... Take one tab by mouth two times a day 2)  Nitroglycerin 0.4 Mg Subl (Nitroglycerin) .... Uad 3)  Furosemide 40 Mg Tabs (Furosemide) .... One tab by mouth bid 4)  Allopurinol 100 Mg Tabs (Allopurinol) .... One tab by mouth qd 5)  Potassium 95 Mg Cr-tabs (Potassium gluconate) .... Take 1 tab daily 6)  Tessalon Perles 100 Mg Caps (Benzonatate) .... Take 1 capsule by mouth two times a day as needed 7)  Temazepam 15 Mg Caps (Temazepam) .... Take 1 capsule by mouth at bedtime  Other Orders: Influenza Vaccine NON MCR ([id number]) Future Orders: Pulmonary Referral (Pulmonary) ... 09/28/2009  Patient Instructions: 1)  Please schedule a follow-up appointment in 1 month. 2)  pls stick with your fluid restrictions  and continue to weigh daily. 3)  I will send in tessalon perles . 4)  I am sending a referral for testing for overnight oxygen, and will discuss the result with Dr Juanetta Gosling , you may need to see him 5)  BMP prior to visit, ICD-9:  non fasting  pls fax report to Dr Dietrich Pates also Prescriptions: TEMAZEPAM 15 MG  CAPS (TEMAZEPAM) Take 1 capsule by mouth at bedtime  #30 x 2   Entered and Authorized by:   Syliva Overman MD   Signed by:   Syliva Overman MD on 09/21/2009   Method used:   Printed then faxed to ...       [facility]  [address] [phone]* (retail)       [address]       [place], [place]  [address]       Ph: [phone] or [phone]       Fax: [phone]   RxID:   [id number] TESSALON PERLES 100 MG CAPS (BENZONATATE) Take 1 capsule by mouth two times a day as needed  #40 x 1   Entered and Authorized by:   Syliva Overman MD   Signed by:   Syliva Overman MD on 09/21/2009   Method used:   Electronically to        [facility]  [address] [phone]* (retail)  [address]       [place], [place]  [address]       Ph: [phone] or [phone]       Fax: [phone]   RxID:   [id number]    Influenza Vaccine    Vaccine Type: Fluvax Non-MCR    Site: right deltoid    Mfr: novartis    Dose: 0.5 ml    Route: IM    Given by: Adella Hare LPN    Exp. Date: 05/2010    Lot #: [id number]    VIS given: 08/08/09 version given September 21, 2009.

## 2010-02-15 NOTE — Progress Notes (Signed)
  Phone Note Call from Patient   Summary of Call: PATIENT WIFE CALLED STATES THAT DUE TO INSURANCE PURPOSE THE INSURANCE IS REQUESTING THAT MR. Libbey HAVE A COLONOSCOPY DONE. PATIENT WIFE IS REQUESTING FOR HER HUSBAND TO  HAVE ONE DONE PLEASE CALL PATIENT BACK @ [phone] Initial call taken by: Eugenio Hoes,  December 28, 2009 3:55 PM  Follow-up for Phone Call        pls checkmand see when his last colonscopy was print a copy for me also so I can respond I do neot see any entered in e chart if over 10 yrs ago he needs one and I will refer or if the last was abnormal  Follow-up by: Syliva Overman MD,  December 29, 2009 3:06 PM  Additional Follow-up for Phone Call Additional follow up Details #1::        no colonoscopy in e chart- endoscopy only Additional Follow-up by: Adella Hare LPN,  December 29, 2009 4:12 PM  New Problems: SPECIAL SCREENING FOR MALIGNANT NEOPLASMS COLON (ICD-V76.51)   Additional Follow-up for Phone Call Additional follow up Details #2::    I have faxed over paperwork to Dr. Patty Sermons office Follow-up by: Curtis Sites,  January 02, 2010 10:02 AM  New Problems: SPECIAL SCREENING FOR MALIGNANT NEOPLASMS COLON (ICD-V76.51)

## 2010-02-15 NOTE — Letter (Signed)
Summary: breathe  breathe   Imported By: Lind Guest 09/29/2009 14:05:52  _____________________________________________________________________  External Attachment:    Type:   Image     Comment:   External Document

## 2010-02-15 NOTE — Miscellaneous (Signed)
Summary: Orders Update  Clinical Lists Changes  Orders: Added new Test order of T-Uric Acid (Blood) [phone]) - Signed

## 2010-02-15 NOTE — Letter (Signed)
Summary: dr. Juanetta Gosling  dr. Juanetta Gosling   Imported By: Lind Guest 11/14/2009 14:57:00  _____________________________________________________________________  External Attachment:    Type:   Image     Comment:   External Document

## 2010-02-15 NOTE — Letter (Signed)
Summary: BREATHE  BREATHE   Imported By: Lind Guest 09/28/2009 16:55:08  _____________________________________________________________________  External Attachment:    Type:   Image     Comment:   External Document

## 2010-02-15 NOTE — Progress Notes (Signed)
Summary: appt  Phone Note Call from Patient   Summary of Call: dr. Harriett Sine office  called and said that he did not show up for his appt. last month and the first time he came he did not know why he was there. she is still trying to get in touch with him now for an appt. Initial call taken by: Lind Guest,  May 09, 2009 11:08 AM  Follow-up for Phone Call        I reviewwed the ov dr Gerilyn Pilgrim referred him for an mRI which he was unabl to have i just discussed this at the ov this week with both of them Follow-up by: Syliva Overman MD,  May 09, 2009 11:17 AM

## 2010-02-15 NOTE — Assessment & Plan Note (Signed)
Summary: pt to be seen 12/20 per Paula/tg      Allergies Added:   Visit Type:  Follow-up Primary Provider:  Dr.Margaret Lodema Hong   History of Present Illness: Mr. Coccia returns today for followup.  He had his ICD gen change several months ago and is now found to have a skin infection medial and superior to his incision line. No fever or chills but his wife states that he feels poorly.  No other complaints.  Current Medications (verified): 1)  Carvedilol 25 Mg Tabs (Carvedilol) .... Take One Tab By Mouth Two Times A Day 2)  Nitroglycerin 0.4 Mg Subl (Nitroglycerin) .... Uad 3)  Furosemide 40 Mg Tabs (Furosemide) .... One Tab By Mouth Bid 4)  Allopurinol 100 Mg Tabs (Allopurinol) .... One Tab By Mouth Qd 5)  Potassium 95 Mg Cr-Tabs (Potassium Gluconate) .... Take 1 Tab Daily 6)  Tussionex Pennkinetic Er 10-8 Mg/73ml Lqcr (Hydrocod Polst-Chlorphen Polst) .... Take 5 Ml Every 12hrs As Needed For Cough 7)  Temazepam 15 Mg Caps (Temazepam) .... Take 1 Capsule By Mouth At Bedtime 8)  Oxygen .... 2 Liters At Bedtime 9)  Cozaar 25 Mg Tabs (Losartan Potassium) .... Take 1 Tab Daily 10)  Aldactone 25 Mg Tabs (Spironolactone) .... Take 1 Tab Daily 11)  Symbicort 160-4.5 Mcg/act Aero (Budesonide-Formoterol Fumarate) .... Take 1 Puff Daily 12)  Aspir-Low 81 Mg Tbec (Aspirin) .... Take 1 Tab Daily 13)  Keflex 500 Mg Caps (Cephalexin) .Marland Kitchen.. 1 Tablet Twice Daily X 7 Days  Allergies (verified): 1)  ! * Sulfa Drugs 2)  ! Enalapril Maleate  Comments:  Nurse/Medical Assistant: patient uses right [facility]  Past History:  Past Medical History: Last updated: 05/04/2009 arthritis gout htn Non-Ischemic CARDIOMYOPATHY (ICD-425.4) ERECTILE DYSFUNCTION (ICD-302.72) GERD (ICD-530.81) GENERALIZED ANXIETY DISORDER (ICD-300.02) HYPERTENSION (ICD-401.9) DEMENTIA, MILD (ICD-294.8) ARTHRITIS, LEFT FOOT (ICD-716.97) Non-Obstructive CAD WARCEF Trial C O  P D      A.  PFT's 10/29/2005  Syphillis h/o hSv type 2  Past Surgical History: Last updated: 12/29/2009 left ankle (not done by Dr. Romeo Apple) s/p Biventricular Pacemaker/AICD implantation 02/19/2005 defibrillator implanted CRTD upgrade Dec 14, 2009.  Review of Systems  The patient denies chest pain, syncope, dyspnea on exertion, and peripheral edema.    Vital Signs:  Patient profile:   77 year old male Weight:      168 pounds BMI:     28.06 Pulse rate:   77 / minute BP sitting:   104 / 67  (right arm)  Vitals Entered By: Dreama Saa, CNA (January 02, 2010 2:24 PM)  Physical Exam  General:  Well developed, well nourished, in no acute distress. Head:  normocephalic and atraumatic Eyes:  Wears glasses Mouth:  No redness or throat injection Neck:  Neck supple, no JVD. No masses, thyromegaly or abnormal cervical nodes. Chest Wall:  a small area of drainage is present near the clavicle, superior and medial to his incision line. Lungs:  Clear bilaterally to auscultation with no wheezes, rales, or rhonchi. Heart:  RRR with normal s1 and s2.  PMI is enlarged and laterally displaced. Abdomen:  Bowel sounds positive; abdomen soft and non-tender without masses, organomegaly, or hernias noted. No hepatosplenomegaly. Msk:  Back normal, normal gait. Muscle strength and tone normal. Pulses:  pulses normal in all 4 extremities Extremities:  No clubbing or cyanosis. Neurologic:  Alert and oriented x 3.    ICD Specifications Following MD:  Lewayne Bunting, MD  ICD Vendor:  [employer]     ICD Model Number:  [id number]     ICD Serial Number:  [id number] ICD DOI:  12/13/2009     ICD Implanting MD:  Lewayne Bunting, MD  Lead 1:    Location: RA     DOI: 02/19/2005     Model #: [id number]     Serial #: [id number]     Status: active Lead 2:    Location: RV     DOI: 02/19/2005     Model #: [id number]     Serial #: [id number]     Status: active Lead 3:    Location: LV     DOI: 02/19/2005     Model #: [id number]      Serial #: [id number]     Status: active  Indications::  NICM, CHF  Explantation Comments: 12/13/09 Medtronic [id number] H explanted  ICD Follow Up ICD Dependent:  No      Episodes Coumadin:  Yes  Brady Parameters Mode DDDR     Lower Rate Limit:  60     Upper Rate Limit 130 PAV 150     Sensed AV Delay:  130  Tachy Zones VF:  200     VT:  250     VT1:  171     Impression & Recommendations:  Problem # 1:  OTHER SPECIFIED DISORDER OF SKIN (ICD-709.8) HIs skin abscess is not adjacent to his ICD incision.  I have drained this today and asked the patient to continue his current meds, wash the area 2 times a day and we will see him back in a couple of weeks.

## 2010-02-15 NOTE — Assessment & Plan Note (Signed)
Summary: device beeping/ ?eri  Medications Added * OXYGEN 2 liters at bedtime      Allergies Added:   Visit Type:  Follow-up Primary Provider:  Dr.Margaret Lodema Hong  CC:  device is beepuing.  History of Present Illness: [name] Macias returns today for ICD followup.  He has a longstanding DCM, Class 2-3 CHF, and is s/p ICD. He was hospitalized several months ago with CHF and is improved.  He has had no additional ICD therapies but he notes that his device has been beeping.  Current Medications (verified): 1)  Carvedilol 25 Mg Tabs (Carvedilol) .... Take One Tab By Mouth Two Times A Day 2)  Nitroglycerin 0.4 Mg Subl (Nitroglycerin) .... Uad 3)  Furosemide 40 Mg Tabs (Furosemide) .... One Tab By Mouth Bid 4)  Allopurinol 100 Mg Tabs (Allopurinol) .... One Tab By Mouth Qd 5)  Potassium 95 Mg Cr-Tabs (Potassium Gluconate) .... Take 1 Tab Daily 6)  Combivent 18-103 Mcg/act Aero (Ipratropium-Albuterol) .... 2 Puffs As Needed 7)  Tussionex Pennkinetic Er 10-8 Mg/82ml Lqcr (Hydrocod Polst-Chlorphen Polst) .... Take 5 Ml Every 12hrs As Needed For Cough 8)  Temazepam 15 Mg Caps (Temazepam) .... Take 1 Capsule By Mouth At Bedtime 9)  Oxygen .... 2 Liters At Bedtime  Allergies (verified): 1)  ! * Sulfa Drugs 2)  ! Enalapril Maleate  Past History:  Past Medical History: Last updated: 05/04/2009 arthritis gout htn Non-Ischemic CARDIOMYOPATHY (ICD-425.4) ERECTILE DYSFUNCTION (ICD-302.72) GERD (ICD-530.81) GENERALIZED ANXIETY DISORDER (ICD-300.02) HYPERTENSION (ICD-401.9) DEMENTIA, MILD (ICD-294.8) ARTHRITIS, LEFT FOOT (ICD-716.97) Non-Obstructive CAD WARCEF Trial C O P D      A.  PFT's 10/29/2005  Syphillis h/o hSv type 2  Past Surgical History: Last updated: 05/04/2009 left ankle (not done by Dr. Romeo Apple) s/p Biventricular Pacemaker/AICD implantation 02/19/2005 defibrillator implanted  Review of Systems       The patient complains of dyspnea on exertion.  The patient denies  chest pain, syncope, and peripheral edema.    Vital Signs:  Patient profile:   77 year old male Weight:      169 pounds BMI:     28.22 Pulse rate:   78 / minute BP sitting:   118 / 80  (right arm)  Vitals Entered By: Dreama Saa, CNA (November 28, 2009 4:24 PM)  Physical Exam  General:  Well-developed,well-nourished,in no acute distress; alert,appropriate and cooperative throughout examination HEENT: No facial asymmetry,  EOMI, No sinus tenderness, TM's Clear, oropharynx  pink and moist.   Chest: Clear to auscultation bilaterally. dectreased air entrythroughout. CVS: S1, S2, No murmurs, No S3.   Abd: Soft, Nontender.  MS: decreased  ROM spine, hips, shoulders and knees.  Ext: No edema.   CNS: CN 2-12 intact, power tone and sensation normal throughout.   Skin: Intact, no visible lesions or rashes.       ICD Specifications Following MD:  [name] Bunting, MD     ICD Vendor:  [employer]     ICD Model Number:  7304     ICD Serial Number:  [id number] ICD DOI:  02/19/2005     ICD Implanting MD:  [name] Bunting, MD  Lead 1:    Location: RA     DOI: 02/19/2005     Model #: [id number]     Serial #: [id number]     Status: active Lead 2:    Location: RV     DOI: 02/19/2005     Model #: [id number]     Serial #: [id number]  Status: active Lead 3:    Location: LV     DOI: 02/19/2005     Model #: [id number]     Serial #: [id number]     Status: active  Indications::  NICM, CHF   ICD Follow Up Remote Check?  No Battery Voltage:  2.62 V     Charge Time:  10.85 seconds     Battery Est. Longevity:  ERI Underlying rhythm:  SR ICD Dependent:  No       ICD Device Measurements Atrium:  Amplitude: 2.9 mV, Impedance: 472 ohms, Threshold: 1.0 V at 0.2 msec Right Ventricle:  Amplitude: 12 mV, Impedance: 488 ohms, Threshold: 1.0 V at 0.2 msec Left Ventricle:  Impedance: 360 ohms, Threshold: 1.0 V at 0.4 msec Shock Impedance: 46/56 ohms   Episodes MS Episodes:  2     Percent Mode Switch:  <1%     Coumadin:   Yes Atrial Pacing:  18%     Ventricular Pacing:  98.6%  Brady Parameters Mode DDDR     Lower Rate Limit:  60     Upper Rate Limit 130 PAV 150     Sensed AV Delay:  130  Tachy Zones VF:  200     VT:  250     VT1:  171     Tech Comments:  No parameter changes.  ERI 10/04/09.  6949 lead stable, SIC  0.  To be scheduled for change out. Altha Harm, LPN  November 28, 2009 5:04 PM  MD Comments:  Agree with above.  Impression & Recommendations:  Problem # 1:  AUTOMATIC IMPLANTABLE CARDIAC DEFIBRILLATOR SITU (ICD-V45.02) HIs device is working normally but is at [employer].  The question we spent considerable time on today was whether to place a new ICD or BiV PPM.  After much deliberation, he has decided to continue with his ICD.  This will be scheduled at the earliest possible time.  Problem # 2:  CHRONIC SYSTOLIC HEART FAILURE (ICD-428.22) His current symptoms are class 2. He will continue his current meds and maintain a low sodium diet. The following medications were removed from the medication list:    Spironolactone 25 Mg Tabs (Spironolactone) .Marland Kitchen... Take 1 tab daily His updated medication list for this problem includes:    Carvedilol 25 Mg Tabs (Carvedilol) .Marland Kitchen... Take one tab by mouth two times a day    Nitroglycerin 0.4 Mg Subl (Nitroglycerin) ..... Uad    Furosemide 40 Mg Tabs (Furosemide) ..... One tab by mouth bid

## 2010-02-15 NOTE — Letter (Signed)
Summary: Appointment - Missed  [facility]  [address] [place] [address]   Phone: [phone]  Fax: [phone]     September 14, 2009 MRN: [id number]   [name] Macias [address] [place], [place]  [address]   Dear [name] Macias,  Our records indicate you missed your appointment on      09/14/09           [name]            It is very important that we reach you to reschedule this appointment. We look forward to participating in your health care needs. Please contact us at the number listed above at your earliest convenience to reschedule this appointment.     Sincerely,    Glass blower/designer

## 2010-02-15 NOTE — Miscellaneous (Signed)
Summary: Home Care Report  Home Care Report   Imported By: Lind Guest 10/24/2009 09:12:52  _____________________________________________________________________  External Attachment:    Type:   Image     Comment:   External Document

## 2010-02-15 NOTE — Letter (Signed)
Summary: Discharge Summary  Discharge Summary   Imported By: Lind Guest 09/04/2009 13:57:21  _____________________________________________________________________  External Attachment:    Type:   Image     Comment:   External Document

## 2010-02-15 NOTE — Assessment & Plan Note (Signed)
Summary: office visit   Vital Signs:  Patient profile:   77 year old male Height:      65 inches Weight:      175.25 pounds BMI:     29.27 O2 Sat:      98 % Pulse rate:   76 / minute Pulse rhythm:   regular Resp:     16 per minute BP sitting:   122 / 80  (left arm) Cuff size:   regular  Vitals Entered By: Everitt Amber LPN (May 04, 2009 8:36 AM)  Nutrition Counseling: Patient's BMI is greater than 25 and therefore counseled on weight management options. CC: Follow up chronic problems   CC:  Follow up chronic problems.  History of Present Illness: Reports  that he has been doing fairly  well. Denies recent fever or chills. Denies sinus pressure, nasal congestion , ear pain or sore throat. Denies chest congestion, or cough productive of sputum. Denies chest pain, palpitations, PND, orthopnea or leg swelling. Denies abdominal pain, nausea, vomitting, diarrhea or constipation. Denies change in bowel movements or bloody stool. Denies dysuria , frequency, incontinence or hesitancy.He is still concerned about the lesion on his penis, denies itch stinging or drainage from he area. Denies  joint pain, swelling, or reduced mobility. Denies headaches, vertigo, seizures. Denies depression, anxiety or insomnia. Denies  rash, lesions, or itch. thwe pt was unable to have the complete neurologivc testring as an mRI was not possible since he has a pacemaker     Current Medications (verified): 1)  Carvedilol 25 Mg Tabs (Carvedilol) .... Take One Tab By Mouth Two Times A Day 2)  Nitroglycerin 0.4 Mg Subl (Nitroglycerin) .... Uad 3)  Furosemide 40 Mg Tabs (Furosemide) .... One Tab By Mouth Bid 4)  Indomethacin 25 Mg Caps (Indomethacin) .... Take 1 Tablet By Mouth Two Times A Day 5)  Norco 5-325 Mg Tabs (Hydrocodone-Acetaminophen) .Marland Kitchen.. 1 By Mouth Q 4 As Needed Pain 6)  Tylenol Extra Strength 500 Mg Tabs (Acetaminophen) .... As Needed 7)  Eplerenone 50 Mg Tabs (Eplerenone) .... Take 1  Tablet Daily 8)  Omeprazole 40 Mg Cpdr (Omeprazole) .... Take 1 Capsule By Mouth Once A Day 9)  Advair Diskus 100-50 Mcg/dose Aepb (Fluticasone-Salmeterol) .Marland Kitchen.. 1 Inhalation Twice Daily 10)  Allopurinol 100 Mg Tabs (Allopurinol) .... One Tab By Mouth Qd 11)  Fluoxetine Hcl 10 Mg Caps (Fluoxetine Hcl) .... Take 1 Capsule By Mouth Once A Day 12)  Spironolactone 25 Mg Tabs (Spironolactone) .... Take 1 Tablet By Mouth Once A Day  Allergies (verified): 1)  ! * Sulfa Drugs 2)  ! Enalapril Maleate  Past History:  Past medical, surgical, family and social histories (including risk factors) reviewed, and no changes noted (except as noted below).  Past Medical History: arthritis gout htn Non-Ischemic CARDIOMYOPATHY (ICD-425.4) ERECTILE DYSFUNCTION (ICD-302.72) GERD (ICD-530.81) GENERALIZED ANXIETY DISORDER (ICD-300.02) HYPERTENSION (ICD-401.9) DEMENTIA, MILD (ICD-294.8) ARTHRITIS, LEFT FOOT (ICD-716.97) Non-Obstructive CAD WARCEF Trial C O P D      A.  PFT's 10/29/2005  Syphillis h/o hSv type 2  Past Surgical History: left ankle (not done by Dr. Romeo Apple) s/p Biventricular Pacemaker/AICD implantation 02/19/2005 defibrillator implanted  Family History: Reviewed history from 07/20/2007 and no changes required. MOTHER  DECEASED  cause unknown FATHER   DECEASED  heart failure one SISTER STILLBORN one BROTHER living   HTN  DM   PROSTATE  CANCER TWO  BROTHERS  DECEASED  CHF  HOMICIDE  Social History: Reviewed history from 01/02/2007 and no changes  required. DISABLED THREE CHIDREN Married Former Smoker Alcohol use-no Drug use-no  Review of Systems      See HPI Eyes:  Denies blurring, discharge, halos, and red eye. Endo:  Denies cold intolerance, excessive hunger, excessive thirst, excessive urination, heat intolerance, polyuria, and weight change. Heme:  Denies abnormal bruising and bleeding. Allergy:  Denies hives or rash and itching eyes.  Physical Exam  General:   Well-developed,well-nourished,in no acute distress; alert,appropriate and cooperative throughout examination HEENT: No facial asymmetry, edentulous EOMI, No sinus tenderness, TM's Clear, oropharynx  pink and moist.   Chest: Clear to auscultation bilaterally.  CVS: S1, S2, No murmurs, No S3.   Abd: Soft, Nontender.  MS: Adequate ROM spine, hips, shoulders and knees.  Ext: No edema.   CNS: CN 2-12 intact, power tone and sensation normal throughout.   Skin: Intact, hypopigmented scarring on tip of penis  Psych: Good eye contact, normal affect.  Memory intact, not anxious or depressed appearing.    Impression & Recommendations:  Problem # 1:  INSOMNIA (ICD-780.52) Assessment Improved  The following medications were removed from the medication list:    Restoril 30 Mg Caps (Temazepam) .Marland Kitchen... Take 1 capsule by mouth at bedtime  Problem # 2:  DEPRESSION (ICD-311) Assessment: Improved  His updated medication list for this problem includes:    Fluoxetine Hcl 10 Mg Caps (Fluoxetine hcl) .Marland Kitchen... Take 1 capsule by mouth once a day  Problem # 3:  GOUT (ICD-274.9) Assessment: Comment Only  His updated medication list for this problem includes:    Allopurinol 100 Mg Tabs (Allopurinol) ..... One tab by mouth qd uric acid level to be checked  Problem # 4:  GERD (ICD-530.81) Assessment: Improved  His updated medication list for this problem includes:    Omeprazole 40 Mg Cpdr (Omeprazole) .Marland Kitchen... Take 1 capsule by mouth once a day  Problem # 5:  HYPERTENSION (ICD-401.9) Assessment: Unchanged  The following medications were removed from the medication list:    Diovan 160 Mg Tabs (Valsartan) .Marland Kitchen... Take one tablet by mouth daily His updated medication list for this problem includes:    Carvedilol 25 Mg Tabs (Carvedilol) .Marland Kitchen... Take one tab by mouth two times a day    Furosemide 40 Mg Tabs (Furosemide) ..... One tab by mouth bid    Eplerenone 50 Mg Tabs (Eplerenone) .Marland Kitchen... Take 1 tablet daily     Spironolactone 25 Mg Tabs (Spironolactone) .Marland Kitchen... 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 Take one tab by mouth two times a day 2)  Nitroglycerin 0.4 Mg Subl (Nitroglycerin) .... Uad 3)  Furosemide 40 Mg Tabs (Furosemide) .... One tab by mouth bid 4)  Indomethacin 25 Mg Caps (Indomethacin) .... Take 1 tablet by mouth two times a day 5)  Norco 5-325 Mg Tabs (Hydrocodone-acetaminophen) .Marland Kitchen.. 1 by mouth q 4 as needed pain 6)  Tylenol Extra Strength 500 Mg Tabs (Acetaminophen) .... As needed 7)  Eplerenone 50 Mg Tabs (Eplerenone) .... Take 1 tablet daily 8)  Omeprazole 40 Mg Cpdr (Omeprazole) .... Take 1 capsule by mouth once a day 9)  Advair Diskus 100-50 Mcg/dose Aepb (Fluticasone-salmeterol) .Marland Kitchen.. 1 inhalation twice daily 10)  Allopurinol 100 Mg Tabs (Allopurinol) .... One tab by mouth qd 11)  Fluoxetine Hcl 10 Mg Caps (Fluoxetine hcl) .... Take 1 capsule by mouth once a day 12)  Spironolactone 25 Mg Tabs (Spironolactone) .... Take 1 tablet by mouth once a day 13)  Acyclovir 800 Mg Tabs (Acyclovir) .... One tablet five times per day  Other Orders: T-Basic Metabolic Panel [id number]) T-Hepatic Function [phone]) T-Lipid Profile [id number]) T-CBC w/Diff [phone]) T-PSA ([id number])  Patient Instructions: 1)  Please schedule a follow-up appointment in 4 months. 2)  It is important that you exercise regularly at least 20 minutes 5 times a week. If you develop chest pain,  have severe difficulty breathing, or feel very tired , stop exercising immediately and seek medical attention. 3)  You need to lose weight. Consider a lower calorie diet and regular exercise.  4)  BMP prior to visit, ICD-9: 5)  Hepatic Panel prior to visit, ICD-9: 6)  Lipid Panel prior to visit, ICD-9: 7)  CBC w/ Diff prior to visit, ICD-9:n   fasting May 4 or after 8)  PSA prior to visit, ICD-9: 9)  Vit D Prescriptions: ALLOPURINOL 100 MG TABS (ALLOPURINOL) one tab by mouth qd  #90 Tablet x 3   Entered by:   Everitt Amber LPN   Authorized by:   Syliva Overman MD   Signed by:   Everitt Amber LPN on 66/44/0347   Method used:   Faxed to ...       [facility] (mail-order)             , [place]         Ph: [phone]       Fax: [phone]   RxID:   [id number] FUROSEMIDE 40 MG TABS (FUROSEMIDE) one tab by mouth bid  #180 x 2   Entered by:   Everitt Amber LPN   Authorized by:   Syliva Overman MD   Signed by:   Everitt Amber LPN on 35/57/3220   Method used:   Faxed to ...       [facility] (mail-order)             , [place]         Ph: [phone]       Fax: [phone]   RxID:   [id number] CARVEDILOL 25 MG TABS (CARVEDILOL) take one tab by mouth two times a day  #180 x 3   Entered by:   Everitt Amber LPN   Authorized by:   Syliva Overman MD   Signed by:   Everitt Amber LPN on 27/03/5007   Method used:   Faxed to ...       [facility] (mail-order)             , [place]         Ph: [phone]       Fax: [phone]   RxID:   [id number] ACYCLOVIR 800 MG TABS (ACYCLOVIR) one tablet five times per day  #50 x 0   Entered by:   Everitt Amber LPN   Authorized by:   Syliva Overman MD   Signed by:   Everitt Amber LPN on 35/36/1443   Method used:   Faxed to ...       [facility] Environmental education officer)             ,  [place]         Ph: [phone]       Fax: [phone]   RxID:   [id number] ACYCLOVIR 800 MG TABS (ACYCLOVIR) one tablet five times per day  #50 x  0   Entered and Authorized by:   Syliva Overman MD   Signed by:   Syliva Overman MD on 05/04/2009   Method used:   Printed then faxed to ...       [facility]  [address] [phone]* (retail)       [address]       [place], [place]  [address]       Ph: [phone] or [phone]       Fax: [phone]   RxID:   [id number]

## 2010-02-15 NOTE — Miscellaneous (Signed)
Summary: Home Care Report  Home Care Report   Imported By: Lind Guest 09/29/2009 14:06:10  _____________________________________________________________________  External Attachment:    Type:   Image     Comment:   External Document

## 2010-02-15 NOTE — Letter (Signed)
Summary: Letter  Letter   Imported By: Lind Guest 09/29/2009 14:05:30  _____________________________________________________________________  External Attachment:    Type:   Image     Comment:   External Document

## 2010-02-15 NOTE — Cardiovascular Report (Signed)
Summary: Office Visit   Office Visit   Imported By: Roderic Ovens 12/19/2009 11:16:41  _____________________________________________________________________  External Attachment:    Type:   Image     Comment:   External Document

## 2010-02-15 NOTE — Letter (Signed)
Summary: overnight pulse  overnight pulse   Imported By: Lind Guest 09/26/2009 09:16:31  _____________________________________________________________________  External Attachment:    Type:   Image     Comment:   External Document

## 2010-02-15 NOTE — Progress Notes (Signed)
[facility]: [facility]   Imported By: Lind Guest 05/31/2009 08:31:22  _____________________________________________________________________  External Attachment:    Type:   Image     Comment:   External Document

## 2010-02-15 NOTE — Progress Notes (Signed)
Summary: Generator change out   Phone Note Outgoing Call   Call placed by: Larita Fife Via LPN,  December 01, 2009 3:36 PM Summary of Call: Pt. and his wife advised that generator change out is scheduled for 12-13-09 @ 12:00pm, arrive at 10:00am to short stay at [facility]. Implantable Device Instructions and Lab orders mailed to pt's address.  New Problems: UNSPECIFIED PRE-OPERATIVE EXAMINATION (ICD-V72.84)   New Problems: UNSPECIFIED PRE-OPERATIVE EXAMINATION (ICD-V72.84)

## 2010-02-15 NOTE — Letter (Signed)
Summary: room air abg  room air abg   Imported By: Lind Guest 10/03/2009 07:45:37  _____________________________________________________________________  External Attachment:    Type:   Image     Comment:   External Document

## 2010-02-15 NOTE — Progress Notes (Signed)
  Phone Note Call from Patient   Summary of Call: COMING IN TODAY [name] Macias from advanced saw [name] Macias and his BP was 78/48 (Monday) Called and dawn sent to ER. Gave IV fluid and reduced lasix and d/c lisinopril.  He has been coughing at night and c/o frothy white sputum coming up.  [name] Macias needs orders to see him weekly for a couple more weeks to monitor BP. Wants social worker referral to see if he can get help with meals on wheels and establishing power of attorney [phone]. Sharon walker Initial call taken by: Everitt Amber LPN,  September 21, 2009 1:27 PM  Follow-up for Phone Call        pls ok the requests, hope he feels better soon Follow-up by: Syliva Overman MD,  September 21, 2009 2:24 PM  Additional Follow-up for Phone Call Additional follow up Details #1::        sharon aware okay to requests Additional Follow-up by: Adella Hare LPN,  September 21, 2009 3:56 PM

## 2010-02-15 NOTE — Assessment & Plan Note (Signed)
Summary: EPH  Medications Added COZAAR 25 MG TABS (LOSARTAN POTASSIUM) TAKE 1 TAB DAILY ALDACTONE 25 MG TABS (SPIRONOLACTONE) take 1 tab daily SYMBICORT 160-4.5 MCG/ACT AERO (BUDESONIDE-FORMOTEROL FUMARATE) take 1 puff daily ASPIR-LOW 81 MG TBEC (ASPIRIN) take 1 tab daily KEFLEX 500 MG CAPS (CEPHALEXIN) 1 tablet twice daily x 7 days      Allergies Added:   Visit Type:  Follow-up Primary Itzayana Pardy:  Dr.Margaret Lodema Hong  CC:  NO CARDIOLOGY COMPLAINTS.  History of Present Illness: Mr. Jean is a very nice 77 y/o AAM we are seeing on follow-up after implantation of CRTD generator change on Dec 14, 2009.  He is here for site check.  He has no complaints of chest discomfort or shortness of breath but he has a small palpule located distal to the site that has been draining.  He states that it has been draining since placement of pacemaker.  I have also asked Gunnar Fusi RN, Dr. Lubertha Basque RN to do a pacemaker check as well. He is otherwise doing well.  His wife states that he seems not to be felling well and is resting a good bit.  He does not complain of fevers and chills.  Current Medications (verified): 1)  Carvedilol 25 Mg Tabs (Carvedilol) .... Take One Tab By Mouth Two Times A Day 2)  Nitroglycerin 0.4 Mg Subl (Nitroglycerin) .... Uad 3)  Furosemide 40 Mg Tabs (Furosemide) .... One Tab By Mouth Bid 4)  Allopurinol 100 Mg Tabs (Allopurinol) .... One Tab By Mouth Qd 5)  Potassium 95 Mg Cr-Tabs (Potassium Gluconate) .... Take 1 Tab Daily 6)  Tussionex Pennkinetic Er 10-8 Mg/47ml Lqcr (Hydrocod Polst-Chlorphen Polst) .... Take 5 Ml Every 12hrs As Needed For Cough 7)  Temazepam 15 Mg Caps (Temazepam) .... Take 1 Capsule By Mouth At Bedtime 8)  Oxygen .... 2 Liters At Bedtime 9)  Cozaar 25 Mg Tabs (Losartan Potassium) .... Take 1 Tab Daily 10)  Aldactone 25 Mg Tabs (Spironolactone) .... Take 1 Tab Daily 11)  Symbicort 160-4.5 Mcg/act Aero (Budesonide-Formoterol Fumarate) .... Take 1 Puff Daily 12)   Aspir-Low 81 Mg Tbec (Aspirin) .... Take 1 Tab Daily 13)  Keflex 500 Mg Caps (Cephalexin) .Marland Kitchen.. 1 Tablet Twice Daily X 7 Days  Allergies (verified): 1)  ! * Sulfa Drugs 2)  ! Enalapril Maleate  Comments:  Nurse/Medical Assistant: patient brought med s [facility]  Past History:  Past Surgical History: left ankle (not done by Dr. Romeo Apple) s/p Biventricular Pacemaker/AICD implantation 02/19/2005 defibrillator implanted CRTD upgrade Dec 14, 2009. PMH-FH-SH reviewed-no changes except otherwise noted  Review of Systems       draining pustule distal to pacemaker site.  All other systems have been reviewed and are negative unless stated above.   Vital Signs:  Patient profile:   77 year old male Weight:      168 pounds O2 Sat:      96 % on Room air Pulse rate:   83 / minute BP sitting:   100 / 55  (left arm)  Vitals Entered By: Dreama Saa, CNA (December 29, 2009 2:19 PM)  O2 Flow:  Room air  Physical Exam  General:  Well developed, well nourished, in no acute distress. Chest Wall:  Pacemaker site well healed.   Lungs:  Clear bilaterally to auscultation and percussion. Heart:  Non-displaced PMI, chest non-tender; regular rate and rhythm, S1, S2 without murmurs, rubs or gallops. Carotid upstroke normal, no bruit. Normal abdominal aortic size, no bruits.  Femorals normal pulses, no bruits. Pedals normal pulses. No edema, no varicosities. Abdomen:  Bowel sounds positive; abdomen soft and non-tender without masses, organomegaly, or hernias noted. No hepatosplenomegaly. Msk:  Back normal, normal gait. Muscle strength and tone normal. Pulses:  pulses normal in all 4 extremities Extremities:  No clubbing or cyanosis. Neurologic:  Alert and oriented x 3. Skin:  Papule located in the upper/mid left chest that is draining serosaquinous fluid. There is no warmth or errythema noted.  Psych:  Normal affect.   EKG  Procedure date:  12/29/2009  Findings:        Atrium and ventricle are paced.     ICD Specifications Following MD:  Lewayne Bunting, MD     ICD Vendor:  Medtronic     ICD Model Number:  [id number]     ICD Serial Number:  [id number] ICD DOI:  12/13/2009     ICD Implanting MD:  Lewayne Bunting, MD  Lead 1:    Location: RA     DOI: 02/19/2005     Model #: [id number]     Serial #: [id number]     Status: active Lead 2:    Location: RV     DOI: 02/19/2005     Model #: [id number]     Serial #: [id number]     Status: active Lead 3:    Location: LV     DOI: 02/19/2005     Model #: [id number]     Serial #: [id number]     Status: active  Indications::  NICM, CHF  Explantation Comments: 12/13/09 Medtronic [id number] H explanted  ICD Follow Up ICD Dependent:  No      Episodes Coumadin:  Yes  Brady Parameters Mode DDDR     Lower Rate Limit:  60     Upper Rate Limit 130 PAV 150     Sensed AV Delay:  130  Tachy Zones VF:  200     VT:  250     VT1:  171     Impression & Recommendations:  Problem # 1:  AUTOMATIC IMPLANTABLE CARDIAC DEFIBRILLATOR SITU (ICD-V45.02) This site is looking healthy.  He is without complaint.  It is interrogated by [employer] today, and steri strips are removed. He will follow with Dr.Taylor in one week.  Problem # 2:  OTHER SPECIFIED DISORDER OF SKIN (ICD-709.8) I have place him on Keflex 500mg  two times a day for one week.  He is to keep site covered and to have neosporin placed topically. He will have close follow-up with Dr. Ladona Ridgel.    Problem # 3:  CHRONIC SYSTOLIC HEART FAILURE (ICD-428.22) He is euvolemic at this time. No lung congestion or edema.  He is to continue current medications.  Refills are provided. His updated medication list for this problem includes:    Carvedilol 25 Mg Tabs (Carvedilol) .Marland Kitchen... Take one tab by mouth two times a day    Nitroglycerin 0.4 Mg Subl (Nitroglycerin) ..... Uad    Furosemide 40 Mg Tabs (Furosemide) ..... One tab by mouth bid    Cozaar 25 Mg Tabs (Losartan potassium) .Marland Kitchen... Take 1 tab daily     Aldactone 25 Mg Tabs (Spironolactone) .Marland Kitchen... Take 1 tab daily    Aspir-low 81 Mg Tbec (Aspirin) .Marland Kitchen... Take 1 tab daily  Patient Instructions: 1)  Your physician recommends that you schedule a follow-up appointment in: 1 week w/ Dr. Ladona Ridgel, 6 months w/ K. Lawerence 2)  Your physician recommends that you continue on your current  medications as directed. Please refer to the Current Medication list given to you today. 3)  Your physician has recommended you make the following change in your medication: Keflex for 7 days. Prescriptions: KEFLEX 500 MG CAPS (CEPHALEXIN) 1 tablet twice daily x 7 days  #14 x 0   Entered by:   Fuller Plan CMA   Authorized by:   [name], NP   Signed by:   Fuller Plan CMA on 12/29/2009   Method used:   Electronically to        [facility] [phone]* (retail)       [address]       [place], [place]  [address]       Ph: [phone] or [phone]       Fax: [phone]   RxID:   [id number]

## 2010-02-15 NOTE — Progress Notes (Signed)
Summary: [facility]  Phone Note Call from Patient   Summary of Call: send in his allopurinol , carvedilol, and  spironolacp 25 mg to [facility]    ALSO HIS FUROSEMIDE                       Initial call taken by: Lind Guest,  October 04, 2009 10:02 AM  Follow-up for Phone Call        Rx Called In Follow-up by: Adella Hare LPN,  October 04, 2009 10:52 AM    Prescriptions: ALLOPURINOL 100 MG TABS (ALLOPURINOL) one tab by mouth qd  #90 Tablet x 0   Entered by:   Adella Hare LPN   Authorized by:   Syliva Overman MD   Signed by:   Adella Hare LPN on 16/10/9602   Method used:   Faxed to ...       [facility] (mail-order)       [address]       [place], [place]  [id number]       Ph: [phone]       Fax: [phone]   RxID:   [id number] FUROSEMIDE 40 MG TABS (FUROSEMIDE) one tab by mouth bid  #180 x 0   Entered by:   Adella Hare LPN   Authorized by:   Syliva Overman MD   Signed by:   Adella Hare LPN on 25/36/6440   Method used:   Faxed to ...       [facility] (mail-order)       [address]       [place], [place]  [id number]       Ph: [phone]       Fax: [phone]   RxID:   [id number] CARVEDILOL 25 MG TABS (CARVEDILOL) take one tab by mouth two times a day  #180 x 0   Entered by:   Adella Hare LPN   Authorized by:   Syliva Overman MD   Signed by:   Adella Hare LPN on 20/25/4270   Method used:   Faxed to ...       [facility] (mail-order)       [address]       [place], [place]  [id number]       Ph: [phone]       Fax: [phone]   RxID:   [id number]

## 2010-02-15 NOTE — Progress Notes (Signed)
Summary: [facility]   Imported By: Lind Guest 02/16/2009 16:00:50  _____________________________________________________________________  External Attachment:    Type:   Image     Comment:   External Document

## 2010-02-15 NOTE — Assessment & Plan Note (Signed)
Summary: per paula per checkout on 01/02/10/tg      Allergies Added:   Visit Type:  Follow-up Primary Provider:  Dr.Margaret Lodema Hong   History of Present Illness: [name] Macias returns today for followup.  He had his ICD gen change several months ago and was found to have a skin infection medial and superior to his incision line. No fever or chills. When I saw him a few weeks ago we drained the area which was approximately 2 mm in duration.  He has not had any more drainage. The site is approximately 6 cm from the medial portion of his skin incision.  Today he c/o cough.  He denies weight gain or peripheral edema. His blood pressure has been controlled.  No other complaints.  Current Medications (verified): 1)  Carvedilol 25 Mg Tabs (Carvedilol) .... Take One Tab By Mouth Two Times A Day 2)  Nitroglycerin 0.4 Mg Subl (Nitroglycerin) .... Uad 3)  Furosemide 40 Mg Tabs (Furosemide) .... One Tab By Mouth Bid 4)  Allopurinol 100 Mg Tabs (Allopurinol) .... One Tab By Mouth Qd 5)  Potassium 95 Mg Cr-Tabs (Potassium Gluconate) .... Take 1 Tab Daily 6)  Temazepam 15 Mg Caps (Temazepam) .... Take 1 Capsule By Mouth At Bedtime 7)  Oxygen .... 2 Liters At Bedtime 8)  Cozaar 25 Mg Tabs (Losartan Potassium) .... Take 1 Tab Daily 9)  Aldactone 25 Mg Tabs (Spironolactone) .... Take 1 Tab Daily 10)  Symbicort 160-4.5 Mcg/act Aero (Budesonide-Formoterol Fumarate) .... Take 1 Puff Daily 11)  Aspir-Low 81 Mg Tbec (Aspirin) .... Take 1 Tab Daily  Allergies (verified): 1)  ! * Sulfa Drugs 2)  ! Enalapril Maleate  Past History:  Past Medical History: Last updated: 05/04/2009 arthritis gout htn Non-Ischemic CARDIOMYOPATHY (ICD-425.4) ERECTILE DYSFUNCTION (ICD-302.72) GERD (ICD-530.81) GENERALIZED ANXIETY DISORDER (ICD-300.02) HYPERTENSION (ICD-401.9) DEMENTIA, MILD (ICD-294.8) ARTHRITIS, LEFT FOOT (ICD-716.97) Non-Obstructive CAD [facility] Trial [facility]      [facility].  PFT's 10/29/2005  Syphillis h/o hSv  type 2  Past Surgical History: Last updated: 12/29/2009 left ankle (not done by Dr. Romeo Apple) s/p Biventricular Pacemaker/AICD implantation 02/19/2005 defibrillator implanted CRTD upgrade Dec 14, 2009.  Review of Systems  The patient denies chest pain, syncope, dyspnea on exertion, and peripheral edema.    Vital Signs:  Patient profile:   77 year old male Weight:      172 pounds BMI:     28.73 O2 Sat:      96 % on Room air Pulse rate:   86 / minute BP sitting:   118 / 70  (left arm)  Vitals Entered By: Dreama Saa, CNA (January 18, 2010 11:08 AM)  O2 Flow:  Room air  Physical Exam  General:  Well developed, well nourished, in no acute distress. Head:  normocephalic and atraumatic Eyes:  Wears glasses Mouth:  No redness or throat injection Neck:  Neck supple, no JVD. No masses, thyromegaly or abnormal cervical nodes. Chest Wall:  ICD incision is well healed. Lungs:  Clear bilaterally to auscultation with no wheezes, rales, or rhonchi. Heart:  RRR with normal s1 and s2.  PMI is enlarged and laterally displaced. Abdomen:  Bowel sounds positive; abdomen soft and non-tender without masses, organomegaly, or hernias noted. No hepatosplenomegaly. Msk:  Back normal, normal gait. Muscle strength and tone normal. Pulses:  pulses normal in all 4 extremities Extremities:  No clubbing or cyanosis. Neurologic:  Alert and oriented x 3.    ICD Specifications Following MD:  [name] Bunting,  MD     ICD Vendor:  Medtronic     ICD Model Number:  D314TRG     ICD Serial Number:  [id number] H ICD DOI:  12/13/2009     ICD Implanting MD:  [name] Bunting, MD  Lead 1:    Location: RA     DOI: 02/19/2005     Model #: [id number]     Serial #: [id number]     Status: active Lead 2:    Location: RV     DOI: 02/19/2005     Model #: 6578     Serial #: [id number]     Status: active Lead 3:    Location: LV     DOI: 02/19/2005     Model #: 4194     Serial #: [id number]     Status: active  Indications::  NICM,  CHF  Explantation Comments: 12/13/09 Medtronic 2725/[id number] H explanted  ICD Follow Up ICD Dependent:  No      Episodes Coumadin:  No  Brady Parameters Mode DDDR     Lower Rate Limit:  60     Upper Rate Limit 130 PAV 130     Sensed AV Delay:  120  Tachy Zones VF:  200     VT:  250     VT1:  171     Impression & Recommendations:  Problem # 1:  OTHER SPECIFIED DISORDER OF SKIN (ICD-709.8) His skin abscess has healed. He is instructed to call if drainage or swelling persist.    Problem # 2:  ACUTE BRONCHITIS (ICD-466.0) I do not think he has bronchitis as his cough is non-productive and he has not had any fever. I wonder the cough could be due to medication and have asked him to stop his ARB. He will continue his steroid inhaler. The following medications were removed from the medication list:    Keflex 500 Mg Caps (Cephalexin) .Marland Kitchen... 1 tablet twice daily x 7 days His updated medication list for this problem includes:    Symbicort 160-4.5 Mcg/act Aero (Budesonide-formoterol fumarate) .Marland Kitchen... Take 1 puff daily  Problem # 3:  AUTOMATIC IMPLANTABLE CARDIAC DEFIBRILLATOR SITU (ICD-V45.02) His device is working normally and his skin is well healed.  Patient Instructions: 1)  Your physician recommends that you schedule a follow-up appointment in: March 2)  Your physician has recommended you make the following change in your medication: Do not take Cozaar until your next appointment with Dr. Ladona Ridgel. Please do not throw Cozaar away.

## 2010-02-15 NOTE — Letter (Signed)
Summary: dr. Gerilyn Pilgrim  dr. Gerilyn Pilgrim   Imported By: Lind Guest 05/08/2009 10:11:45  _____________________________________________________________________  External Attachment:    Type:   Image     Comment:   External Document

## 2010-02-15 NOTE — Progress Notes (Signed)
Summary: referral  Phone Note Other Incoming   Caller: dr Lodema Hong,  Summary of Call: pls refer Joash Mccurley to dr Gerilyn Pilgrim, dx positive blood tes for syphylis with forgetfulness andpossible early dementia, pls fax the labs I have spoken to Dr. Gerilyn Pilgrim Initial call taken by: Syliva Overman MD,  January 18, 2009 9:40 AM  Follow-up for Phone Call        information was faxed over to dr. Harriett Sine office.  Follow-up by: Rudene Anda,  January 18, 2009 10:19 AM

## 2010-02-20 ENCOUNTER — Encounter: Payer: Self-pay | Admitting: Family Medicine

## 2010-02-21 NOTE — Letter (Signed)
Summary: dr.edward hawkins  dr.edward hawkins   Imported By: Lind Guest 02/14/2010 11:15:04  _____________________________________________________________________  External Attachment:    Type:   Image     Comment:   External Document

## 2010-03-01 NOTE — Letter (Signed)
Summary: dr. Juanetta Gosling  dr. Juanetta Gosling   Imported By: Lind Guest 02/23/2010 11:46:13  _____________________________________________________________________  External Attachment:    Type:   Image     Comment:   External Document

## 2010-03-19 ENCOUNTER — Encounter: Payer: Self-pay | Admitting: Internal Medicine

## 2010-03-26 LAB — BASIC METABOLIC PANEL
Calcium: 9.4 mg/dL (ref 8.4–10.5)
Chloride: 110 mEq/L (ref 96–112)
Creatinine, Ser: 1.35 mg/dL (ref 0.4–1.5)
GFR calc Af Amer: >60 mL/min (ref >60)
Sodium: 141 mEq/L (ref 135–145)

## 2010-03-27 LAB — CARDIAC PANEL(CRET KIN+CKTOT+MB+TROPI)
CK, MB: 2.1 ng/mL (ref 0.3–4.0)
Relative Index: INVALID (ref 0.0–2.5)
Relative Index: INVALID (ref 0.0–2.5)
Relative Index: INVALID (ref 0.0–2.5)
Total CK: 70 U/L (ref 7–232)
Troponin I: 0.18 ng/mL — ABNORMAL HIGH (ref 0.00–0.06)
Troponin I: 0.19 ng/mL — ABNORMAL HIGH (ref 0.00–0.06)
Troponin I: 0.2 ng/mL — ABNORMAL HIGH (ref 0.00–0.06)

## 2010-03-27 LAB — BASIC METABOLIC PANEL
CO2: 27 mEq/L (ref 19–32)
CO2: 27 mEq/L (ref 19–32)
Calcium: 9.3 mg/dL (ref 8.4–10.5)
Calcium: 9.4 mg/dL (ref 8.4–10.5)
Creatinine, Ser: 1.26 mg/dL (ref 0.4–1.5)
GFR calc Af Amer: >60 mL/min (ref >60)
GFR calc Af Amer: >60 mL/min (ref >60)
Sodium: 142 mEq/L (ref 135–145)
Sodium: 142 mEq/L (ref 135–145)

## 2010-03-27 LAB — DIFFERENTIAL
Basophils Absolute: 0 10*3/uL (ref 0.0–0.1)
Basophils Relative: 0 % (ref 0–1)
Eosinophils Absolute: 0.4 10*3/uL (ref 0.0–0.7)
Eosinophils Relative: 0 % (ref 0–5)
Lymphocytes Relative: 16 % (ref 12–46)
Lymphocytes Relative: 8 % — ABNORMAL LOW (ref 12–46)
Lymphs Abs: 1 10*3/uL (ref 0.7–4.0)
Monocytes Absolute: 0 10*3/uL — ABNORMAL LOW (ref 0.1–1.0)
Monocytes Relative: 7 % (ref 3–12)
Monocytes Relative: 1 % — ABNORMAL LOW (ref 3–12)
Neutro Abs: 4.2 10*3/uL (ref 1.7–7.7)
Neutro Abs: 5.1 10*3/uL (ref 1.7–7.7)
Neutrophils Relative %: 68 % (ref 43–77)

## 2010-03-27 LAB — COMPREHENSIVE METABOLIC PANEL
Albumin: 3.3 g/dL — ABNORMAL LOW (ref 3.5–5.2)
BUN: 17 mg/dL (ref 6–23)
Creatinine, Ser: 1.34 mg/dL (ref 0.4–1.5)
Glucose, Bld: 186 mg/dL — ABNORMAL HIGH (ref 70–99)
Total Protein: 6.5 g/dL (ref 6.0–8.3)

## 2010-03-27 LAB — CBC
Hemoglobin: 12.1 g/dL — ABNORMAL LOW (ref 13.0–17.0)
MCH: 29.2 pg (ref 26.0–34.0)
MCH: 28.8 pg (ref 26.0–34.0)
MCHC: 32.6 g/dL (ref 30.0–36.0)
MCV: 88.3 fL (ref 78.0–100.0)
Platelets: 302 10*3/uL (ref 150–400)
RBC: 4.15 MIL/uL — ABNORMAL LOW (ref 4.22–5.81)
RDW: 14.4 % (ref 11.5–15.5)

## 2010-03-27 LAB — POCT CARDIAC MARKERS
CKMB, poc: 1.1 ng/mL (ref 1.0–8.0)
Myoglobin, poc: 64.4 ng/mL (ref 12–200)
Troponin i, poc: 0.1 ng/mL — ABNORMAL HIGH (ref 0.00–0.09)

## 2010-03-29 LAB — COMPREHENSIVE METABOLIC PANEL
AST: 17 U/L (ref 0–37)
Albumin: 4.2 g/dL (ref 3.5–5.2)
Alkaline Phosphatase: 113 U/L (ref 39–117)
Chloride: 108 mEq/L (ref 96–112)
GFR calc Af Amer: 54 mL/min — ABNORMAL LOW (ref >60)
Potassium: 4.1 mEq/L (ref 3.5–5.1)
Sodium: 138 mEq/L (ref 135–145)
Total Bilirubin: 0.5 mg/dL (ref 0.3–1.2)
Total Protein: 8 g/dL (ref 6.0–8.3)

## 2010-03-29 LAB — BASIC METABOLIC PANEL
BUN: 38 mg/dL — ABNORMAL HIGH (ref 6–23)
BUN: 46 mg/dL — ABNORMAL HIGH (ref 6–23)
BUN: 49 mg/dL — ABNORMAL HIGH (ref 6–23)
CO2: 24 mEq/L (ref 19–32)
CO2: 27 mEq/L (ref 19–32)
CO2: 27 mEq/L (ref 19–32)
CO2: 29 mEq/L (ref 19–32)
CO2: 29 mEq/L (ref 19–32)
Calcium: 9.4 mg/dL (ref 8.4–10.5)
Calcium: 9.2 mg/dL (ref 8.4–10.5)
Calcium: 9.2 mg/dL (ref 8.4–10.5)
Calcium: 9.2 mg/dL (ref 8.4–10.5)
Calcium: 9.2 mg/dL (ref 8.4–10.5)
Calcium: 9.4 mg/dL (ref 8.4–10.5)
Calcium: 9.6 mg/dL (ref 8.4–10.5)
Chloride: 107 mEq/L (ref 96–112)
Chloride: 109 mEq/L (ref 96–112)
Creatinine, Ser: 1.42 mg/dL (ref 0.4–1.5)
Creatinine, Ser: 1.51 mg/dL — ABNORMAL HIGH (ref 0.4–1.5)
Creatinine, Ser: 1.67 mg/dL — ABNORMAL HIGH (ref 0.4–1.5)
Creatinine, Ser: 1.71 mg/dL — ABNORMAL HIGH (ref 0.4–1.5)
GFR calc Af Amer: 37 mL/min — ABNORMAL LOW (ref >60)
GFR calc Af Amer: 47 mL/min — ABNORMAL LOW (ref >60)
GFR calc Af Amer: 49 mL/min — ABNORMAL LOW (ref >60)
GFR calc Af Amer: 58 mL/min — ABNORMAL LOW (ref >60)
GFR calc non Af Amer: 31 mL/min — ABNORMAL LOW (ref >60)
GFR calc non Af Amer: 36 mL/min — ABNORMAL LOW (ref >60)
GFR calc non Af Amer: 40 mL/min — ABNORMAL LOW (ref >60)
Glucose, Bld: 108 mg/dL — ABNORMAL HIGH (ref 70–99)
Glucose, Bld: 109 mg/dL — ABNORMAL HIGH (ref 70–99)
Glucose, Bld: 114 mg/dL — ABNORMAL HIGH (ref 70–99)
Glucose, Bld: 125 mg/dL — ABNORMAL HIGH (ref 70–99)
Glucose, Bld: 135 mg/dL — ABNORMAL HIGH (ref 70–99)
Glucose, Bld: 90 mg/dL (ref 70–99)
Glucose, Bld: 94 mg/dL (ref 70–99)
Potassium: 3.7 mEq/L (ref 3.5–5.1)
Potassium: 4.2 mEq/L (ref 3.5–5.1)
Sodium: 135 mEq/L (ref 135–145)
Sodium: 138 mEq/L (ref 135–145)
Sodium: 142 mEq/L (ref 135–145)
Sodium: 142 mEq/L (ref 135–145)
Sodium: 144 mEq/L (ref 135–145)

## 2010-03-29 LAB — GLUCOSE, CAPILLARY
Glucose-Capillary: 173 mg/dL — ABNORMAL HIGH (ref 70–99)
Glucose-Capillary: 183 mg/dL — ABNORMAL HIGH (ref 70–99)
Glucose-Capillary: 226 mg/dL — ABNORMAL HIGH (ref 70–99)

## 2010-03-29 LAB — DIFFERENTIAL
Basophils Absolute: 0 10*3/uL (ref 0.0–0.1)
Basophils Absolute: 0 10*3/uL (ref 0.0–0.1)
Basophils Absolute: 0 10*3/uL (ref 0.0–0.1)
Basophils Absolute: 0 10*3/uL (ref 0.0–0.1)
Basophils Relative: 0 % (ref 0–1)
Basophils Relative: 0 % (ref 0–1)
Basophils Relative: 0 % (ref 0–1)
Basophils Relative: 0 % (ref 0–1)
Basophils Relative: 0 % (ref 0–1)
Basophils Relative: 0 % (ref 0–1)
Basophils Relative: 0 % (ref 0–1)
Eosinophils Absolute: 0 10*3/uL (ref 0.0–0.7)
Eosinophils Absolute: 0 10*3/uL (ref 0.0–0.7)
Eosinophils Absolute: 0.5 10*3/uL (ref 0.0–0.7)
Eosinophils Absolute: 0.6 10*3/uL (ref 0.0–0.7)
Eosinophils Relative: 0 % (ref 0–5)
Eosinophils Relative: 8 % — ABNORMAL HIGH (ref 0–5)
Eosinophils Relative: 8 % — ABNORMAL HIGH (ref 0–5)
Lymphocytes Relative: 14 % (ref 12–46)
Lymphocytes Relative: 13 % (ref 12–46)
Lymphocytes Relative: 8 % — ABNORMAL LOW (ref 12–46)
Lymphs Abs: 0.5 10*3/uL — ABNORMAL LOW (ref 0.7–4.0)
Monocytes Absolute: 0.1 10*3/uL (ref 0.1–1.0)
Monocytes Absolute: 0.4 10*3/uL (ref 0.1–1.0)
Monocytes Absolute: 0.6 10*3/uL (ref 0.1–1.0)
Monocytes Relative: 6 % (ref 3–12)
Monocytes Relative: 10 % (ref 3–12)
Monocytes Relative: 3 % (ref 3–12)
Monocytes Relative: 5 % (ref 3–12)
Neutro Abs: 7.5 10*3/uL (ref 1.7–7.7)
Neutro Abs: 18.4 10*3/uL — ABNORMAL HIGH (ref 1.7–7.7)
Neutro Abs: 5.6 10*3/uL (ref 1.7–7.7)
Neutro Abs: 7.9 10*3/uL — ABNORMAL HIGH (ref 1.7–7.7)
Neutrophils Relative %: 75 % (ref 43–77)
Neutrophils Relative %: 63 % (ref 43–77)
Neutrophils Relative %: 64 % (ref 43–77)
Neutrophils Relative %: 92 % — ABNORMAL HIGH (ref 43–77)
Neutrophils Relative %: 95 % — ABNORMAL HIGH (ref 43–77)
Neutrophils Relative %: 96 % — ABNORMAL HIGH (ref 43–77)

## 2010-03-29 LAB — CARDIAC PANEL(CRET KIN+CKTOT+MB+TROPI)
CK, MB: 3.7 ng/mL (ref 0.3–4.0)
Relative Index: INVALID (ref 0.0–2.5)
Relative Index: INVALID (ref 0.0–2.5)
Relative Index: INVALID (ref 0.0–2.5)
Relative Index: INVALID (ref 0.0–2.5)
Total CK: 69 U/L (ref 7–232)
Total CK: 77 U/L (ref 7–232)
Total CK: 80 U/L (ref 7–232)
Total CK: 95 U/L (ref 7–232)
Troponin I: 0.24 ng/mL — ABNORMAL HIGH (ref 0.00–0.06)
Troponin I: 0.27 ng/mL — ABNORMAL HIGH (ref 0.00–0.06)

## 2010-03-29 LAB — CBC
HCT: 36.9 % — ABNORMAL LOW (ref 39.0–52.0)
HCT: 37.6 % — ABNORMAL LOW (ref 39.0–52.0)
Hemoglobin: 13.3 g/dL (ref 13.0–17.0)
Hemoglobin: 12.2 g/dL — ABNORMAL LOW (ref 13.0–17.0)
Hemoglobin: 12.2 g/dL — ABNORMAL LOW (ref 13.0–17.0)
MCH: 29.5 pg (ref 26.0–34.0)
MCH: 29.5 pg (ref 26.0–34.0)
MCH: 29.6 pg (ref 26.0–34.0)
MCH: 29.7 pg (ref 26.0–34.0)
MCHC: 32.7 g/dL (ref 30.0–36.0)
MCHC: 32.5 g/dL (ref 30.0–36.0)
MCHC: 32.5 g/dL (ref 30.0–36.0)
MCHC: 32.8 g/dL (ref 30.0–36.0)
MCHC: 33.4 g/dL (ref 30.0–36.0)
MCHC: 33.4 g/dL (ref 30.0–36.0)
MCV: 89.1 fL (ref 78.0–100.0)
Platelets: 247 10*3/uL (ref 150–400)
Platelets: 253 10*3/uL (ref 150–400)
Platelets: 265 10*3/uL (ref 150–400)
Platelets: 287 10*3/uL (ref 150–400)
RBC: 4.52 MIL/uL (ref 4.22–5.81)
RBC: 4.66 MIL/uL (ref 4.22–5.81)
RDW: 14.5 % (ref 11.5–15.5)
RDW: 14.6 % (ref 11.5–15.5)
RDW: 14.6 % (ref 11.5–15.5)
RDW: 14.7 % (ref 11.5–15.5)
RDW: 14.8 % (ref 11.5–15.5)
RDW: 14.8 % (ref 11.5–15.5)
WBC: 10.1 10*3/uL (ref 4.0–10.5)
WBC: 10.5 10*3/uL (ref 4.0–10.5)
WBC: 12.8 10*3/uL — ABNORMAL HIGH (ref 4.0–10.5)
WBC: 6.1 10*3/uL (ref 4.0–10.5)

## 2010-03-29 LAB — BLOOD GAS, ARTERIAL
Acid-Base Excess: 1 mmol/L (ref 0.0–2.0)
Acid-Base Excess: 0.2 mmol/L (ref 0.0–2.0)
Bicarbonate: 24.7 mEq/L — ABNORMAL HIGH (ref 20.0–24.0)
Bicarbonate: 24.9 mEq/L — ABNORMAL HIGH (ref 20.0–24.0)
Bicarbonate: 25.7 mEq/L — ABNORMAL HIGH (ref 20.0–24.0)
O2 Content: 3 L/min
O2 Saturation: 98.1 %
Patient temperature: 37
TCO2: 21.6 mmol/L (ref 0–100)
TCO2: 22.3 mmol/L (ref 0–100)
pCO2 arterial: 36.6 mmHg (ref 35.0–45.0)
pCO2 arterial: 47.4 mmHg — ABNORMAL HIGH (ref 35.0–45.0)
pH, Arterial: 7.443 (ref 7.350–7.450)
pH, Arterial: 7.245 — ABNORMAL LOW (ref 7.350–7.450)
pH, Arterial: 7.335 — ABNORMAL LOW (ref 7.350–7.450)
pO2, Arterial: 82.2 mmHg (ref 80.0–100.0)
pO2, Arterial: 134 mmHg — ABNORMAL HIGH (ref 80.0–100.0)

## 2010-03-29 LAB — EXPECTORATED SPUTUM ASSESSMENT W GRAM STAIN, RFLX TO RESP C

## 2010-03-29 LAB — HEMOGLOBIN A1C
Hgb A1c MFr Bld: 6.1 % — ABNORMAL HIGH (ref <5.7)
Mean Plasma Glucose: 128 mg/dL — ABNORMAL HIGH (ref <117)

## 2010-03-29 LAB — URINALYSIS, ROUTINE W REFLEX MICROSCOPIC
Glucose, UA: NEGATIVE mg/dL
Ketones, ur: NEGATIVE mg/dL
Nitrite: NEGATIVE
Specific Gravity, Urine: 1.015 (ref 1.005–1.030)
pH: 5 (ref 5.0–8.0)

## 2010-03-29 LAB — POCT CARDIAC MARKERS
CKMB, poc: <1 ng/mL — ABNORMAL LOW (ref 1.0–8.0)
Myoglobin, poc: 126 ng/mL (ref 12–200)
Myoglobin, poc: 102 ng/mL (ref 12–200)
Myoglobin, poc: 105 ng/mL (ref 12–200)
Troponin i, poc: 0.08 ng/mL (ref 0.00–0.09)

## 2010-03-29 LAB — T4, FREE: Free T4: 1.25 ng/dL (ref 0.80–1.80)

## 2010-03-29 LAB — BRAIN NATRIURETIC PEPTIDE
Pro B Natriuretic peptide (BNP): 303 pg/mL — ABNORMAL HIGH (ref 0.0–100.0)
Pro B Natriuretic peptide (BNP): 410 pg/mL — ABNORMAL HIGH (ref 0.0–100.0)

## 2010-05-11 ENCOUNTER — Encounter: Payer: Self-pay | Admitting: Family Medicine

## 2010-05-17 ENCOUNTER — Ambulatory Visit: Payer: Medicare HMO | Admitting: Family Medicine

## 2010-05-23 ENCOUNTER — Encounter: Payer: Self-pay | Admitting: Family Medicine

## 2010-05-29 NOTE — Discharge Summary (Signed)
NAMEDELMAN, GOSHORN                 ACCOUNT NO.:  [id number]   MEDICAL RECORD NO.:  [id number]          PATIENT TYPE:  INP   LOCATION:  A302                          FACILITY:  [facility]   PHYSICIAN:  Skeet Latch, DO    DATE OF BIRTH:  Dec 03, 1932   DATE OF ADMISSION:  09/12/2007  DATE OF DISCHARGE:  09/01/2009LH                               DISCHARGE SUMMARY   DISCHARGE DIAGNOSES:  1. Congestive heart failure exacerbation.  2. Urinary tract infection.  3. Diarrhea, improved.  4. Insomnia.  5. History of hypertension.  6. History of chronic obstructive pulmonary disease.   BRIEF HOSPITAL COURSE:  This is a 77 year old African American man who  was into the ER with complaints of abdominal pain and shortness of  breath for the past few days.  The patient states these symptoms were  gradual on onset, happening over about a 3 day period.  He started to  having worsening shortness of breath especially when lying flat and had  increasing shortness of breath with exertion.  The patient denied any  chest pain, but was complaining of some vague lower abdominal pain and  had decreased appetite.  He started having more frequent diarrhea.  The  patient describes a complaint of some slight nausea.  The patient denies  any fevers or chills.  The patient was seen for the above symptoms and  was admitted.   His initial labs showed a white count of 7300, potassium was 3.3, BUN  13, creatinine 1.1, and his total creatinine kinase was 91.  CK-MB was  2.7, troponin was 0.3, and BNP was 693.  His urine showed few bacteria.  C. diff toxin was pending at that time and he was admitted for a  congestive heart failure exacerbation.  Cardiology was consulted.  He  was placed on Lasix.  He had a cardiac enzymes performed.  He was  started on aspirin, beta-blocker, and ACE inhibitor.  Cardiology saw the  patient.  I reordered a chest x-ray, was unremarkable.  I believe with  the nonclassic presentation with  pulmonary symptoms and GI symptoms.  I  believe he has probably had a component of CHF, also with concomitant  bronchitis and urinary tract infection.  I believe the patient could be  discharged with close followup as an outpatient.  The patient's last  chest x-ray done today showed moderate cardiomegaly with pacer and AICD  lead.  No active lung disease.  CT of his abdomen and pelvis showed no  acute intraabdominal findings or small periumbilical/anterior abdominal  wall hernia containing a single loop of small bowel.  Next, showed  diverticulosis.  Next, no small bowel effusions.  The patient is stable  and is wanting to go home, so the patient will be discharged today.   MEDICATIONS ON DISCHARGE:  1. Aricept 10 mg once a day.  2. Lasix 60 mg twice a day.  3. Coreg 25 mg twice daily.  4. Vasotec 5 mg daily.  5. Spironolactone 25 mg daily.  6. K-Dur 20 mEq daily.  7. Cipro 250 mg twice a  day for 3 days.  8. Aspirin 81 mg daily.  The patient has a study with [facility], continue      that study.   VITALS ON DISCHARGE:  Temperature 97.6, pulse 73, respirations 20, and  blood pressure 108/77.   LABORATORY DATA:  No labs were performed today.   DISPOSITION:  The patient to be discharged to home.   CONDITION:  Stable.   DISCHARGE INSTRUCTIONS:  The patient is to maintain a low-sodium and  heart-healthy diet.  He is to increase his activity slowly.  The patient  is to follow up with Dr. Lodema Hong in 1 week.  Also, follow up with  [facility] in the next 2-3 weeks.  The patient is to return to  the emergency room, if he has any more severe diarrhea or dehydration-  type symptoms, chest pain, or shortness of breath.      Skeet Latch, DO  Electronically Signed     SM/MEDQ  D:  09/15/2007  T:  09/16/2007  Job:  [id number]   cc:   Milus Mallick. Lodema Hong, M.D.  Fax: [phone]

## 2010-05-29 NOTE — Assessment & Plan Note (Signed)
[facility]                       [place] CARDIOLOGY OFFICE NOTE   NAME:COBBRaynaldo, Falco                          MRN:          [id number]  DATE:03/01/2008                            DOB:          17-May-1932    CARDIOLOGIST:  Gerrit Friends. Dietrich Pates, MD, [facility].   PRIMARY CARE PHYSICIAN:  Milus Mallick. Lodema Hong, M.D.   REASON FOR VISIT:  Lightheadedness.   HISTORY OF PRESENT ILLNESS:  Mr. Chain is a 77 year old male patient with  a history of nonischemic cardiomyopathy, status post CRT/AICD  implantation with a Medtronic device who recently followed up in our  [facility] office for the [facility] trial.  He told the nurse he was having  some lightheadedness and palpitations and was added onto my schedule  today.  He has felt several episodes over the last couple of weeks of  tachy palpitations that come on at any time.  He begins to feel  lightheaded and near syncopal.  He denies any frank syncope.  He denies  any defibrillator discharges.  He notes that his symptoms may last up to  2-3 minutes and then suddenly abate.  He denies any chest discomfort.  He denies any significant changes in his shortness of breath.  He is  NYHA class II.  He denies orthopnea, PND or pedal edema.  He has noted  some swelling under his left nipple recently that is somewhat tender.  This has been ongoing for the last couple of weeks as well.   CURRENT MEDICATIONS:  WARCEF trial, Aricept 10 mg daily, carvedilol 25  mg b.i.d., furosemide 40 mg b.i.d., potassium 20 mEq daily,  spironolactone 25 mg daily, enalapril 5 mg daily, Tylenol p.r.n., cough  syrup p.r.n., Cialis 20 mg p.r.n., indomethacin 25 mg b.i.d. p.r.n.,  hydrocodone p.r.n.   ALLERGIES:  SULFA.   SOCIAL HISTORY:  He denies tobacco abuse.   REVIEW OF SYSTEMS:  Please see HPI.  Denies fevers, chills.  He has had  a nonproductive cough.  Denies melena, hematochezia, hematuria or  dysuria.  The rest of the review of systems  are negative.   PHYSICAL EXAM:  He is a well-nourished, well-developed male in no acute  distress.  Blood pressure is 112/72, pulse 79, weight 167 pounds.  NECK:  Normal without JVD.  CARDIAC:  Normal S1 and S2.  Regular rate and rhythm.  No murmur.  LUNGS:  Clear to auscultation bilaterally.  No wheezing, no rales.  CHEST:  He does have some induration underneath his left nipple and  areola.  There is some mild tenderness to palpation.  He has some  enlargement of tissue underneath his right nipple but not to the degree  of his left.  ABDOMEN:  Soft, nontender.  EXTREMITIES:  Without edema.  NEUROLOGIC:  He is alert and oriented x3.  Cranial nerves II-XII are  grossly intact.  SKIN:  Warm and dry.   Electrocardiogram demonstrates an underlying sinus rhythm, ventricular  pacing with a heart rate of 79.   ASSESSMENT AND PLAN:  1. Tachy palpitations and near syncope.  The patient is certainly at  risk for ventricular tachycardia.  At this point in time, it sounds      as though he is describing ventricular tachycardia that is treated      with anti tachycardia pacing from his device.  Other possibilities      include paroxysmal atrial fibrillation.  Initially, we will proceed      with blood work to include a CMET, CBC, TSH and magnesium.  Will      have the Medtronic rep interrogate his device tomorrow.  If he has      had no ventricular arrhythmias, then we will need to consider      placing him on an event monitor.  The patient was also interviewed      and examined by Dr. Dietrich Pates today.  We recommend no driving until      his device is interrogated.  2. Left breast gynecomastia.  He has been on spironolactone for many      years.  These symptoms have just come about over the last couple of      weeks.  Will proceed with a mammogram to rule out any significant      underlying pathology.  I suspect most of his symptoms are probably      related to his spironolactone.  3.  Nonischemic cardiomyopathy.  He is on a good medical regimen.  He      is optivolemic on exam.  4. WARCEF trial.  He will continue followup with our research nurses.  5. Hypertension, controlled.  6. Nonobstructive coronary disease by cardiac catheterization in 2004.      He is not having any symptoms of angina at this time.  No ischemic      workup is warranted at this time.  In the event that he does have a      lot of ventricular tachycardia, we may need to consider Myoview      testing.   DISPOSITION:  The patient will be brought back in followup with me or  Dr. Dietrich Pates in 1 week or sooner p.r.n.   ADDENDUM:  As Mr. Adkins was leaving the office today, he noted that his  device was interrogated yesterday in our [place] office when he was  seen by the research nurses for the [facility] trial.  I contacted the EP  nurse in [place] and she was able to tell me that the patient has had  3 episodes of nonsustained V tach.  She cannot tell me exactly when  these occurred and for how long.  There was no evidence of anti-  tachycardia pacing, and no therapies were delivered.  She also noted  that no mode switches had been identified.  We had the patient wait in  the office and get orthostatic vital signs.  These were normal with a  blood pressure of 108/67 sitting with a pulse of 72, 108/62 with a pulse  of 77 standing, 104/77 with a pulse of 77  after 2 minutes, 109/68 with a pulse of 77 after 5 minutes.  Therefore,  the patient will be set up for a 21-day event monitor.  He will follow  up as previously indicated.      Tereso Newcomer, PA-C  Electronically Signed      Gerrit Friends. Dietrich Pates, MD, [facility]  Electronically Signed   SW/MedQ  DD: 03/01/2008  DT: 03/01/2008  Job #: [id number]   cc:   Milus Mallick. Lodema Hong, M.D.

## 2010-05-29 NOTE — Assessment & Plan Note (Signed)
[place] [facility]                       [place] CARDIOLOGY OFFICE NOTE   NAME:COBBZackariah, Vanderpol                          MRN:          [id number]  DATE:03/01/2008                            DOB:          [birth year]    ADDENDUM   As Mr. Koch was leaving the office today, he noted that his device was  interrogated yesterday in our [place] office when he was seen by the  research nurses for the [facility] trial.  I contacted the EP nurse in  [place] and she was able to tell me that the patient has had 3  episodes of nonsustained V tach.  She cannot tell me exactly when these  occurred and for how long.  There was no evidence of anti-tachycardia  pacing, and no therapies were delivered.  She also noted that no mode  switches had been identified.  We had the patient wait in the office and  get orthostatic vital signs.  These were normal with a blood pressure of  108/67 sitting with a pulse of 72, 108/62 with a pulse of 77 standing,  104/77 with a pulse of 77 after 2 minutes, 109/68 with a pulse of 77  after 5 minutes.  Therefore, the patient will be set up for a 21-day  event monitor.  He will follow up as previously indicated.      Tereso Newcomer, PA-C       Gerrit Friends. Dietrich Pates, MD, [facility]    SW/MedQ  DD: 03/01/2008  DT: 03/01/2008  Job #: [id number]

## 2010-05-29 NOTE — Assessment & Plan Note (Signed)
[place] [facility]                         ELECTROPHYSIOLOGY OFFICE NOTE   NAME:[name], [name]                          MRN:          [id number]  DATE:08/28/2006                            DOB:          [birth year]    [name] was seen today in the [facility] on August 28, 2006 for  followup of his Medtronic model number [id number] Maximo. Date of implant was  February 15, 2005 for nonischemic cardiomyopathy. On interrogation of his  device, his battery voltage was 3.06 with a charge time of 7.77 seconds.  P wave was measured at 3.8 millivolts with an atrial capture threshold  of 1 volt at 0.2 milliseconds and an atrial lead impedance of 536 ohms.  R waves measured 13.7 millivolts with a right ventricular pacing  threshold of 1 volt at 0.2 milliseconds and a right ventricular lead  impedance of 632 ohms. Left ventricular pacing threshold was 1.5 volts  at 0.1 milliseconds and a left ventricular lead impedance of 424 ohms.  Shock impedance was 56. There were 8 nonsustained episodes and 2  ventricular tachycardia episodes treated with HB therapy effectively.  There was one mode switch episode noted. He is on Coumadin in the WARCEF  study. He is V pacing about 98.8% of the time with an underlying today  of sinus rhythm at 67 beats per minute. This gentleman also has 6949  recall lead which has been reprogrammed. His SIC counter today was 0,  and his impedance has been stable. He will be seen again here in the  [place] office in three months' time.      Altha Harm, LPN  Electronically Signed      Doylene Canning. Ladona Ridgel, MD  Electronically Signed   PO/MedQ  DD: 08/28/2006  DT: 08/29/2006  Job #: [id number]

## 2010-05-29 NOTE — H&P (Signed)
NAMEDIONTAY, ROSENCRANS                 ACCOUNT NO.:  [id number]   MEDICAL RECORD NO.:  [id number]          PATIENT TYPE:  INP   LOCATION:  A302                          [facility]AC[facility]:  [facility]   PHYSICIAN:  Samara Snide, MDDATE OF BIRTH:  October 18, 1932   DATE OF ADMISSION:  09/12/2007  DATE OF DISCHARGE:  LH                              HISTORY & PHYSICAL   PRIMARY CARE DOCTOR:  Dr. Lodema Hong.   CHIEF COMPLAINT:  Abdominal pain and shortness of breath.   HISTORY OF PRESENT ILLNESS:  The patient is a 77 year old African  American gentleman who presented to emergency room this morning with the  complaints of abdominal pain and also shortness of breath for past 2-3  days.   The patient's primary care doctors, Dr. Lodema Hong.   He also had worsening shortness of breath, worse with lying flat.  Denies any chest pain.   Also complained of vague lower abdominal pain that persists, associated  with more frequent diarrhea over the past 2 nights; complains of nausea,  no vomiting.   The patient also has history of congestive heart failure, hypertension,  COPD, previous history of peptic ulcer disease, and gastroesophageal  reflux disease.  There is also a history of nonischemic cardiomyopathy,  status post AICD placed in February 2007.  The patient's Cardiology is  [facility] Card[facility] Office.   It is also noted that the patient is currently on WARCEF research study  that compares warfarin versus aspirin and reduced cardiac ejection  fraction study.   No fever or chills noted.   PAST MEDICAL HISTORY:  Reported history of nonischemic cardiomyopathy,  status post AICD in February 2007, history of hypertension, history of  COPD, peptic ulcer disease, remote history of gastroesophageal reflux  disease as well.   ALLERGIES:  Reports allergies to SULFA.   FAMILY HISTORY:  Reports his father died of heart disease and congestive  heart failure.  Mother died of pneumonia, and also had a  younger brother  died of congestive heart failure.   SOCIAL HISTORY:  The patient lives with his wife.  He used to work at  [employer] before his retirement, has 5 children.  Quit smoking 20 years  ago.  No history of alcohol use.  Denies any drug use.   REVIEW OF SYSTEMS:  As reported, positive for shortness of breath for  the past 1 week.  No chest pain.  Also, has some abdominal pain, some  nausea, loose stools.  No vomiting.  No fever or chills reported.  No  chest pain.  No dizziness.   PHYSICAL EXAMINATION:  VITAL SIGNS:  Temperature 97.5, blood pressure  127/79, pulse of 78, and respirations 20.  GENERAL:  Elderly African American gentleman lying in bed in no  distress.  EYES:  Conjunctivae look normal.  NECK:  No supple.  No JVD.  CHEST:  Respiratory, decreased air entry at the bases.  Has some  scattered crepitations noted.  CARDIOVASCULAR:  Cardiac, normal.  The patient has an AICD in the chest  wall.  GASTROINTESTINAL:  Abdomen soft.  Bowel sounds present.  Vague lower  abdominal tenderness.  No guarding or rigidity noted.  MUSCULOSKELETAL:  Able to move all 4 extremities.  Edema/varicosities,  trace edema on examination.  NEUROLOGICAL:  Cranial nerves grossly intact.  Alert, awake, and  oriented x3.  PSYCHIATRIC:  Normal.   LABORATORY DATA:  WBC 7.3, hemoglobin 12.8, and platelets 306.  INR 1.1.  Sodium 141, potassium 3.3, BUN 13, and creatinine 1.1.  Liver function  test; SGOT 17, SGPT 14, albumin 3.5, and total protein 6.8.  Cardiac,  total creatine kinase 91, CK-MB 2.7, troponin 0.3, BNP 693.  UA clear.  Specific gravity 1.025, protein 30, and bacteria few.  C. diff toxin  pending.  Stool culture pending.   ASSESSMENT AND PLAN:  1. Congestive heart failure exacerbation.  The patient's BNP 693.  Admit pt to hospital.    Lasix, monitor diuresis, and the patient will get cardiac enzymes x3,  rule out acute coronary syndrome.  The patient's troponin is borderline   positive 0.3 at present.  We will start the patient on aspirin, beta-blocker, ACE inhibitor, and  we will get EKG x3 on the patient.  1. Hypertension.  The patient's blood pressure is satisfactory.  We      will continue current medications from home.  2. Chronic obstructive pulmonary disease.  DuoNeb treatment 4 times a      day and monitor.  3. Diarrhea.  The patient had recent diarrhea for the past 2 days.  We      will send for stool workup including stool for Clostridium      difficile x3, also stool ova and parasite.  4. Abdominal pain.  The patient had CT abdomen and pelvis, which was      negative for any acute intra-abdominal findings.  6.nonischemic cardiomyopathy, status post AICD in February 2007.   PROCEDURES:  Include CT abdomen and pelvis.  No acute intra-abdominal  findings.  Small periumbilical anterior abdominal wall hernia containing  a single loop of small bowel diverticulosis.  New small bilateral  pleural effusion.  CT pelvis, no acute findings.  Chest x-ray, moderate cardiomegaly.  Pulmonary venous hypertension.  No acute finding.     Per review of home medications, I do not see the patient had been on  warfarin, we will follow up on that. The patient's EKG shows paced  rhythm, ventricular rate 72 per minute.  No chest pain reported per patient, we will monitor.  Positive troponins  probably related to his underlying congestive heart failure .      Samara Snide, MD  Electronically Signed     SAS/MEDQ  D:  09/12/2007  T:  09/13/2007  Job:  [id number]

## 2010-05-29 NOTE — Letter (Signed)
August 10, 2008    Milus Mallick. Lodema Hong, MD  [address]  [place], [place] [address]   RE:  TSUNEO, FAISON  MRN:  [id number]  /  DOB:  Nov 14, 1932   Dear [name]:   Mr. Schlitt returns to the office for the final visit of the research  protocol in which he has participated.  He will start aspirin after  coming off study drug, which was either aspirin or warfarin.  From a  clinical standpoint, he continues to do superbly.  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   Current medications include:  1. WARCEF study drug.  2. Aricept 10 mg daily.  3. Carvedilol 25 mg b.i.d.  4. Furosemide 40 mg b.i.d.  5. KCL 20 mEq daily.  6. Enalapril 5 mg daily.   At a recent visit with Dr. Ladona Ridgel, spironolactone was stopped due to  gynecomastia and breast tenderness.   PHYSICAL EXAMINATION:  GENERAL:  On exam, very pleasant, trim gentleman,  in no acute distress.  VITAL SIGNS:  The weight is 176 pounds, 10 pounds increased since 2009.  Blood pressure 110/65, heart rate 72 and regular, respirations 12 and  unlabored.  NECK:  No jugular venous distention; no HJR.  LUNGS:  Clear.  CARDIAC:  Normal first and second heart sounds; no murmur nor gallop.  ABDOMEN:  Slightly protuberant; soft and nontender; no bruits; normal  bowel sounds.  EXTREMITIES:  Trace edema.   IMPRESSION:  Mr. People is doing generally well.  We will substitute  eplerenone 50 mg daily for spironolactone.  A mammogram performed in  February of this year was unremarkable except for the presence of  gynecomastia.  Laboratories performed at that time were good as well  with a normal CBC and normal chemistry profile except for stable renal  insufficiency with a creatinine of 1.6.  TSH was normal.   He recently underwent defibrillator assessment.  The device was  functioning normally and has never discharged.  I will plan to reassess  this nice gentleman in 6 months - it is gratifying to see him  doing so  well.    Sincerely,      Gerrit Friends. Dietrich Pates, MD, [facility]  Electronically Signed    RMR/MedQ  DD: 08/10/2008  DT: 08/11/2008  Job #: [id number]

## 2010-05-29 NOTE — Consult Note (Signed)
NAMEBERLIN, MOKRY                 ACCOUNT NO.:  [id number]   MEDICAL RECORD NO.:  [id number]          PATIENT TYPE:  INP   LOCATION:  A302                          FACILITY:  [facility]   PHYSICIAN:  Gerrit Friends. Dietrich Pates, MD, FACCDATE OF BIRTH:  1932/02/18   DATE OF CONSULTATION:  09/14/2007  DATE OF DISCHARGE:                                 CONSULTATION   PRIMARY CARE PHYSICIAN:  Milus Mallick. Lodema Hong, M.D.   CARDIOLOGIST:  Gerrit Friends. Dietrich Pates, MD, [facility]   PRIMARY ELECTROPHYSIOLOGIST:  Doylene Canning. Ladona Ridgel, M.D.   REASON FOR CONSULTATION:  Congestive heart failure.   HISTORY OF PRESENT ILLNESS:  [name] is a 77 year old male patient with  a history of nonischemic cardiomyopathy and an EF of 25-30%, status post  CRT/AICD implantation who presented to [facility] emergency room on  September 12, 2007 with complaints of 3 days of progressively worsening  shortness of breath that progressed to NYHA class IV symptoms.  He noted  associated paroxysmal nocturnal dyspnea and orthopnea.  He also noted  some abdominal fullness with radiation to his chest that he described as  chest tightness.  He received IV Lasix in the emergency room.  An  abdominal and pelvic CT was also obtained that was negative for acute  findings.  The patient has had some dysuria and some evidence for UTI by  urinalysis.  He is being started on empiric antibiotics today.  He notes  that since admission from the hospital his breathing has improved.  He  has diuresed about 2 liters since admission.  He feels like his  breathing is back to baseline.  His troponins have been mildly elevated  without peak trough pattern, and his CK MBs have been negative.  We are  now asked to further evaluate.   PAST MEDICAL HISTORY:  1. Non-ischemic cardiomyopathy with EF of 25-30% by echocardiogram in      March of 2006.  2. Status post CRT/AICD implantation in February of 2007 (Medtronic).  3. Nonobstructive coronary disease by catheterization in  June of 2004:      RCA 40% proximal.  4. WARCEF trial.  5. Hypertension.  6. COPD.  7. GERD.      a.     History of esophageal dilatation.   MEDICATIONS PRIOR TO ADMISSION:  1. Aricept 10 mg daily.  2. Lasix 40 mg b.i.d. c  3. Carvedilol 25 mg b.i.d.  4. WARCEF study drug.  5. Spironolactone.  6. Of note, Diovan is listed in his previous office notes.  We will      need to confirm whether or not he is taking that medication.   ALLERGIES:  SULFA.   SOCIAL HISTORY:  The patient lives in [place] with wife.  He is  retired from a [employer].  He quit smoking many years ago and denies  alcohol abuse.  His father died from congestive heart failure.  He had a  brother that died from a myocardial infarction in his 71s.   REVIEW OF SYSTEMS:  Please see the HPI.  He denies fevers, chills,  headache,  rash.  He has had orthopnea and PND associated with his  presenting complaints.  He also has noted edema which has decreased  since admission.  He has had a quite significant cough productive of  yellow sputum.  He denies hemoptysis.  He did note some near syncope  with his presenting symptoms, but otherwise no syncope.  He denies  bright red blood per rectum, melena or dysphagia.  The rest of the  review of systems are negative.   PHYSICAL EXAMINATION:  GENERAL:  He is a well-nourished, well-developed  male in no acute distress.  VITAL SIGNS:  Blood pressure is 103/68, pulse 74, respirations 20,  temperature 98.3, oxygen saturation is 93% on room air.  HEENT:  Normal.  NECK:  Without JVD.  LYMPH:  Without lymphadenopathy.  ENDOCRINE:  Without thyromegaly.  CARDIAC:  Normal S1 and S2.  Regular rate and rhythm.  No S3, no murmur.  LUNGS:  With faint expiratory wheezes bilaterally.  No rales.  ABDOMEN:  Soft, nontender with normoactive bowel sounds.  No  organomegaly.  Positive HJR is noted.  EXTREMITIES:  Without clubbing, cyanosis or edema.  MUSCULOSKELETAL:  Without joint  deformity.  NEUROLOGIC:  He is alert and oriented x3.  Cranial nerves II-XII grossly  intact.  VASCULAR:  Without carotid bruits bilaterally.   CHEST X-RAY:  Stable cardiomegaly and pulmonary venous congestion.  No  acute findings.   ELECTROCARDIOGRAM:  Normal sinus rhythm, heart rate of 77, ventricular  paced rhythm.   LABORATORY DATA:  White count 7300, hemoglobin 12.8, platelet count  292,000.  Potassium 3.4, creatinine 1.22, glucose 97.  TSH 1.833.  BNP  earlier this admission was 1100; it is currently 306.  INR 1.1.  CK-MB  negative x3.  Troponin #1 of 0.46, #2 of 0.45, #3 of 0.49.   IMPRESSION:  1. Acute-on-chronic systolic congestive heart failure - improved.  2. Nonischemic cardiomyopathy with an ejection fraction of 25-30%.  3. Minimal nonobstructive coronary disease by catheterization in 2004.  4. Elevated troponins - likely insignificant.  5. Status post CRT/AICD implantation.  6. WARCEF study.  7. Chronic obstructive pulmonary disease with possible acute      exacerbation.  8. Possible urinary tract infection.   PLAN:  The patient was also interviewed an examined by Dr. [place].  He presented with a non classic presentation.  He had  pulmonary symptoms, as well as GI symptoms (the patient complained of  abdominal pain and some diarrhea) as well as elevated BNP.  His exam is  notable for HJR.  He probably did have a component of CHF which has  improved with diuresis.  He also has symptoms consistent with bronchitis  and possible urinary tract infection.  Antibiotics have been started.  His echocardiogram is pending.  His diuretics will be changed over to  p.o.  A D-dimer will be obtained to exclude pulmonary embolism.  The  patient can likely be discharged within the next 24 hours from a cardiac  perspective.  We will try to confirm his medications that he takes at  home.  He should be on an ACE inhibitor or an ARB, in addition to his  other medications.    Thank you very much for the consultation.  We will be glad to follow the  patient throughout the remainder of this admission.      Tereso Newcomer, PA-C      Gerrit Friends. Dietrich Pates, MD, [facility]  Electronically Signed    SW/MEDQ  D:  09/14/2007  T:  09/14/2007  Job:  [id number]   cc:   Lonia Blood, M.D.

## 2010-05-29 NOTE — Assessment & Plan Note (Signed)
[facility]                       [place] CARDIOLOGY OFFICE NOTE   NAME:COBBSahid, Borba                          MRN:          [id number]  DATE:07/08/2006                            DOB:          July 10, 1932    REFERRING PHYSICIAN:  Milus Mallick. Lodema Hong, M.D.   HISTORY OF PRESENT ILLNESS:  Mr. Cybulski returns to the office for  continued assessment and treatment of a non-ischemic cardiomyopathy.  Since his last visit 9 months ago, he has done quite well. He reports no  dyspnea, nor chest discomfort. His defibrillator has never discharged.  He did develop constipation, which he attributed to his prescription  strength potassium supplement. Accordingly, he has substituted over-the-  counter potassium at a much lower dose.   MEDICATIONS:  His other medications include Furosemide 40 mg daily,  Carvedilol 3.125 mg b.i.d., Diovan 160 mg daily, Aricept 10 mg daily,  spironolactone 25 mg daily, Warcef study drug, and Lorazepam 0.5 mg  p.r.n.   PHYSICAL EXAMINATION:  GENERAL:  A pleasant, well appearing gentleman.  VITAL SIGNS:  Weight 177, 4 pounds more than in March of this year.  Blood pressure 120/70, heart rate 80 and regular. Respiratory rate 16.  NECK:  Normal carotid upstrokes without bruits. No jugular venous  distention.  LUNGS:  Minimal coarse right basilar rales.  CARDIAC:  Normal first and second heart sounds. Fourth heart sound  present.  ABDOMEN:  Soft, nontender. No masses, no organomegaly.  EXTREMITIES:  No edema. Normal distal pulses.   LABORATORY DATA:  Stool for Hemoccult testing was negative in October of  last year. Vaccinations are up to date. The most recent chemistry  profile in March was unremarkable.   IMPRESSION:  Mr. Frate is doing astoundingly well with current medical  therapy. Since he has had some non-sustained ventricular tachycardia, we  will titrate Carvedilol dosage upwards, first to 6.25 mg b.i.d. and then  12.5 mg  b.i.d. We will recheck a chemistry profile and plan a return  visit in 6 months.     Gerrit Friends. Dietrich Pates, MD, [facility]  Electronically Signed    RMR/MedQ  DD: 07/08/2006  DT: 07/08/2006  Job #: [id number]   cc:   Milus Mallick. Lodema Hong, M.D.

## 2010-05-29 NOTE — Assessment & Plan Note (Signed)
[facility]                       [place] CARDIOLOGY OFFICE NOTE   NAME:COBBBaldwin, Racicot                          MRN:          [id number]  DATE:08/12/2007                            DOB:          11/28/1932    Mr. Gunnarson is seen today for the [facility] research study.   He has a nonischemic cardiomyopathy with an AICD placed in February  2007.   The patient is doing fairly well.  He does not have any significant  chest pain, PND, or orthopnea.  He has mild lower extremity edema.  His  device has not gone off.  He has not had any bleeding complications.   His review of systems otherwise negative.   He is on,  1. WARCEF study drug.  2. Aricept 10 mg a day.  3. Diovan 160 a day.  4. KCl 20 b.i.d.  5. Nexium 40 a day.  6. Carvedilol 25 b.i.d.  7. Lasix 40 a day.  8. Spirolactone 25 a day.  9. KCl 20 a day.   PHYSICAL EXAMINATION:  GENERAL:  Remarkable for elderly black male in no  distress.  VITAL SIGNS:  His weight is 178, blood pressure is 110/67, pulse is 70  and paced, respiratory rate 14, and afebrile.  HEENT:  Unremarkable.  NECK:  Carotids are without bruit.  No lymphadenopathy, thyromegaly, or  JVP elevation.  LUNGS:  Clear.  Good diaphragmatic motion.  No wheezing.  AICD is under  left collarbone in good position.  HEART:  S1 and S2.  PMI increased and laterally displaced.  No obvious  murmur.  ABDOMEN:  Benign.  Bowel sounds positive.  No AAA, no tenderness, no  bruit, no hepatosplenomegaly or hepatojugular reflux.  EXTREMITIES:  Distal pulses are intact.  Edema +1 bilaterally.  NEUROLOGIC:  Nonfocal.  SKIN:  Warm and dry.  MUSCULOSKELETAL:  No muscular weakness.   IMPRESSION:  1. Warfarin versus Aspirin in Reduced Cardiac Ejection Fraction      (WARCEF) study closeout per research nurses.  No bleeding      diathesis.  Exam consistent with functional class I congestive      heart failure.  WARCEF study sheet filled out and  completed.  2. Congestive heart failure, currently fairly euvolemic.  Continue      current dose of Lasix for      lower extremity edema.  3. Automatic implantable cardioverter-defibrillator.  No discharges.      Functioning normally.  The patient will follow up with Dr. Ladona Ridgel      in 3 months.     Noralyn Pick. Eden Emms, MD, [facility]  Electronically Signed    PCN/MedQ  DD: 08/12/2007  DT: 08/13/2007  Job #: [id number]

## 2010-05-29 NOTE — Assessment & Plan Note (Signed)
[facility]                         ELECTROPHYSIOLOGY OFFICE NOTE   NAME:[name] Macias, [name]                          MRN:          [id number]  DATE:12/03/2006                            DOB:          [birth year]    [name] Macias returns today for a followup visit.  He is a very pleasant 77-  year-old man with a longstanding nonischemic cardiomyopathy and  congestive heart failure, who is status post BiV ICD insertion.  He  returns today for followup.  He denies chest pain.  He denies shortness  of breath.  He denies dietary indiscretion, though he has had problems  eating too much salt in the past.  Since I saw him last, he has not been  in the hospital and overall feels well.   MEDICATIONS:  His medications today include:  1. Potassium supplement.  2. Diovan 160 a day.  3. Aricept.  4. Aldactone 25 mg a day.  5. WARCEF Study drug.  6. Furosemide 40 mg twice daily.  7. He had been on carvedilol in the past, but had been intolerant of      this secondary to severe fatigue.   PHYSICAL EXAMINATION:  On physical examination today, he is a pleasant  77 year old man in no distress.  Blood pressure today was 126/76, the pulse was 90 and regular, the  respirations were 18, weight was 178 pounds.  NECK:  Revealed no jugulovenous distention.  LUNGS:  Clear bilaterally to auscultation.  No wheezes, rales or rhonchi  were present.  CARDIOVASCULAR:  Exam revealed a regular rate and rhythm with a normal  S1 and S2.  EXTREMITIES:  Demonstrated no edema.   Interrogation of his defibrillator demonstrates a Medtronic Maximo with  R waves of 13 and P waves of 3, the impedance 552 in the atrium, 600 in  the ventricle, 408 in the left ventricle, threshold of 1.2 in the A and  the RV and 1.5 at 0.2 in the LV.  There were no intercurrent IC  therapies.  He has 1 nonsustained episode.  He was 99% V-paced.   IMPRESSION:  1. Nonischemic cardiomyopathy.  2. Congestive heart  failure.  3. Status post implantable cardioverter-defibrillator insertion.   DISCUSSION:  Overall, [name] Macias is stable.  His defibrillator is working  normal and his heart failure is well controlled.  He will continue his  present medical therapy and see her back in 3-4 months.     Doylene Canning. Ladona Ridgel, MD  Electronically Signed    GWT/MedQ  DD: 12/03/2006  DT: 12/04/2006  Job #: [id number]

## 2010-06-01 NOTE — Op Note (Signed)
NAMEHELIO, LACK                 ACCOUNT NO.:  [id number]   MEDICAL RECORD NO.:  [id number]          PATIENT TYPE:  INP   LOCATION:  2899                         FACILITY:  [facility]   PHYSICIAN:  Doylene Canning. Ladona Ridgel, M.D.  DATE OF BIRTH:  03/23/1932   DATE OF PROCEDURE:  02/19/2005  DATE OF DISCHARGE:                                 OPERATIVE REPORT   PROCEDURE PERFORMED:  Biventricular ICD implantation.   INTRODUCTION:  The patient is a 77 year old male with nonischemic  cardiomyopathy and class III heart failure and bundle branch block with an  EF of 15%. He is now referred with all the above for ICD implantation.   PROCEDURE:  After informed consent was obtained, the patient was taken to  the diagnostic EP lab in the fasting state.  After the usual preparation and  draping, intravenous fentanyl and midazolam was given for sedation.  Lidocaine 30 mL was infiltrated into the left infraclavicular region. An 8  cm incision was carried out over this region with electrocautery utilized to  dissect down to the fascial plane. There was 10 mL of contrast injected into  the left upper extremity venous system demonstrating a patent left  subclavian vein.  It was subsequently punctured x3 and the Medtronic model  6949 65 cm active fixation defibrillation lead, serial number [id number] was  advanced into the right ventricle. Next, the Medtronic model 5076 52 cm  active fixation pacing lead, serial number [id number] was advanced into the  right atrium. Mapping was carried out in the right ventricle. And at the  final site on the RV septum.  The R-waves measured 16 mV and the pacing  impedance was 916 ohms with the lead actively fixed. The pacing threshold  was 1.5 volts at 0.5 milliseconds. A 10-volt pacing did not dilate the  diaphragm.  With the RV lead in satisfactory position, attention was then  turned to placement of the atrial lead.  It was placed in the right atrial  appendage where P-waves  measured 4 mV and the pacing threshold 0.4 volts at  0.5 milliseconds.  The pacing impedance was 615 ohms.  A 10-volt pacing in  this location also did not stimulate the diaphragm. With the right atrial  and right ventricular leads in satisfactory position, attention then turned  to placement of the left ventricular lead. The coronary sinus guiding  catheter was inserted into the coronary sinus without difficulty utilizing a  6-French Hexapolar EP catheter to aid in cannulation.  Venography of the  coronary sinus was carried out demonstrating a lateral vein which was quite  small and a high lateral vein which was acceptable for LV pacing. The  Medtronic model 4194 88 cm bipolar passive fixation LV pacing lead was  advanced into this lateral vein. At the initial site which was approximately  two thirds out from base to apex, there was diaphragmatic stimulation.  Withdrawing the lead back approximately to a point between one third to one  half the distance from the base to apex, there was no diaphragmatic  stimulation and the pacing threshold was  a volt at 0.5 milliseconds.  With  these satisfactory parameters, the leads were secured to the subpectoralis  fascia with a figure-of-eight silk suture. The sewing sleeves were secured  with silk suture. Electrocautery was utilized to make a subcutaneous pocket.  Kanamycin irrigation was utilized to irrigate the pocket and electrocautery  utilized to assure hemostasis. At this point, the Medtronic InSync Maximo  model 7304 biventricular ICD, serial number [id number] was connected to the  atrial and ventricular pacing leads and placed in the subcutaneous pocket.  Generator was secured with silk suture. Additional kanamycin was utilized to  irrigate the pocket and defibrillation threshold testing carried out.   After the patient was more deeply sedated with fentanyl and Versed, VF was  induced with a T-wave shock. A 15-joule shock was then delivered  terminating  ventricle fibrillation and restoring sinus rhythm.  Five minutes was allowed  to elapse and second defibrillation threshold testing carried out. Again VF  was induced with a T-wave shock again 15-joule shock was delivered which  terminated ventricular fibrillation and restored sinus rhythm. At this  point, no additional defibrillation threshold testing was carried out and  the incision was closed with a layer of 2-0 Vicryl, followed by layer 3-0  Vicryl, followed by a layer of 4-0 Vicryl. Benzoin was painted on the skin  and Steri-Strips were applied and pressure dressing was placed. The patient  was returned to his room in satisfactory condition.   COMPLICATIONS:  There were no immediate procedure complications.   RESULTS:  This demonstrated successful implantation of a Medtronic  biventricular ICD in the patient with nonischemic cardiomyopathy, class III  heart failure with an ejection fraction of 15% and wide QRS rhythm (160  milliseconds).           ______________________________  Doylene Canning. Ladona Ridgel, M.D.     GWT/MEDQ  D:  02/19/2005  T:  02/19/2005  Job:  [id number]   cc:   Dirk Dress. Katrinka Blazing, M.D.  Fax: [phone]   Vida Roller, M.D.  Fax: [phone]

## 2010-06-01 NOTE — Discharge Summary (Signed)
NAMESTEVIN, BIELINSKI                             ACCOUNT NO.:  [id number]   MEDICAL RECORD NO.:  [id number]                   PATIENT TYPE:  INP   LOCATION:  3714                                 FACILITY:  [facility]   PHYSICIAN:  Pricilla Riffle, M.D.                 DATE OF BIRTH:  1932-04-27   DATE OF ADMISSION:  07/01/2002  DATE OF DISCHARGE:  07/02/2002                           DISCHARGE SUMMARY - REFERRING   DISCHARGE DIAGNOSES:  1. Progressive orthopnea.  2. Nonobstructive coronary artery disease by left heart catheterization July 01, 2002.  3. Nonischemic cardiomyopathy with ejection fraction 15-20%.  4. Nonsustained ventricular tachycardia with no definite indication for     intraventricular conduction delay.  5. Evaluated for the [facility] trial.  6. Right bundle branch block.   SECONDARY DIAGNOSES:  1. Hypertension.  2. Benign prostatic hypertrophy, status post transurethral resection of the     prostate November 2001.  3. Strong family history of coronary artery disease/myopathy (premature     coronary artery disease).  4. Remote tobacco use.  5. Urge incontinence.   PROCEDURE:  July 01, 2002, left heart catheterization:  The study showed  that the left main, the left anterior descending, the left circumflex were  all free of disease.  The right coronary artery had a 40% proximal stenosis,  ejection fraction 15-20.   CONSULTATION:  He was also consulted by Electrophysiology and seen by Dr.  Lewayne Bunting who, after reviewing the record, felt that there was no  indication for empiric ICD insertion.  His recommendation was to continue  beta-blockers, ACE inhibitors, low-sodium and low-cholesterol diet with  exercise.   DISCHARGE DISPOSITION:  At the time of discharge, Mr. Caris was comfortable.  He was not requiring any supplemental oxygen.  He was having no trouble  breathing either in the upright or the supine position.   LABORATORY DATA:  On admission, hemoglobin  14.2, hematocrit 42.1.  Potassium  3.5, sodium 138, chloride 102, bicarbonate 27, BUN 15, creatinine 1.1,  glucose 98.   He has also been placed on many new medication therapy during this  admission.  He goes home with the following medications.   DISCHARGE MEDICATIONS:  1. Fish oil 1 g b.i.d.  2. Altace 10 mg daily.  3. Enteric-coated aspirin 325 mg daily.  4. Lasix 20 mg p.o. b.i.d.  5. Aldactone 25 mg, one daily.  6. Potassium chloride 20 mEq b.i.d.  7. Bisoprolol 5 mg, 1/2 tablet daily.  8. Tessalon Perles 100 mg t.i.d. for cough.  9. Ambien 5 mg taken at bedtime for sleep as needed.   DISCHARGE INSTRUCTIONS:  He is to walk daily to keep up his strength.  He is  asked not to lift anything heavy, not to drive, nor to engage in sexual  activity for the next two days.   DISCHARGE DIET:  Low-sodium,  low-cholesterol diet.   WOUND CARE:  Mr. Fontenette may shower.  He is to call [facility] if his  catheterization site swells, develops increasing pain, or starts to drain.   FOLLOWUP:  He will have followup by Dr. Dorethea Clan in two weeks.  He is to call  [phone] to schedule this appointment, and he will have laboratory studies  at [facility] Monday, July 05, 2002.  They will be a BMET and an  electrocardiogram.  These will be faxed to Dr. Dorethea Clan.  He also is to call  Dr. Loleta Chance to schedule an office visit.   BRIEF HISTORY:  Mr. Rio is a 77 year old male who complains of shortness of  breath for the last two days and worsening orthopnea.  He sat up in a chair  all last evening.  He also complains of dyspnea on exertion.  He denies any  peripheral edema.  He complains of chest tightness associated with shortness  of breath which lasted all night.  Chest tightness is located in the lower  anterior chest and the upper abdomen.  Nothing will alleviate or aggravate  this chest pain.  The patient was given 40 mg of IV Lasix in the emergency  room and reported rather quick relief  of his shortness of breath.   The patient has a known history of dilated cardiomyopathy with ejection  fraction between 25-30%.  His admission x-ray shows pulmonary edema, and he  had an increased BNP level consistent with CHF.  A 2D echocardiogram was  repeated this admission.  The results of this study are pending.  He was  also scheduled for left heart catheterization.  This was done July 01, 2002,  and the results have been dictated above.  The study showed nonobstructive  coronary artery disease with an ejection fraction of 15-20%.   HOSPITAL COURSE:  Is as described in discharge disposition.  After reporting  to the emergency room on June 29, 2002, he was given a diuretic.  With  improvement in his orthopnea, he was discharged but scheduled for a left  heart catheterization on July 01, 2002.  This was done.  He was kept 23  hours postcatheterization with a rather radical adjustment to his medication  regimen as dictated above.  He will have followup in [place], with  laboratory studies as dictated.     Jerene Dilling, P.A.-C  [facility]         Pricilla Riffle, M.D.    Adline Peals  D:  07/02/2002  T:  07/03/2002  Job:  [id number]   cc:   Vida Roller, M.D.  Fax: [phone]   Annia Friendly. Loleta Chance, M.D.  [address]  [place]  [place] [address]  Fax: [phone]    cc:   Vida Roller, M.D.  Fax: [phone]   Annia Friendly. Loleta Chance, M.D.  [address]  [place]  [place] [address]  Fax: [phone]

## 2010-06-01 NOTE — H&P (Signed)
[name], [name]                             ACCOUNT NO.:  [id number]   MEDICAL RECORD NO.:  [id number]                   PATIENT TYPE:  INP   LOCATION:  A213                                 FACILITY:  [facility]   PHYSICIAN:  Margaretmary Dys, M.D.            DATE OF BIRTH:  1933-01-03   DATE OF ADMISSION:  10/01/2003  DATE OF DISCHARGE:                                HISTORY & PHYSICAL   ADMISSION DIAGNOSES:  1.  Chest pain, rule out myocardial infarction.  2.  Dyspnea.  3.  Hypertension.  4.  History of nonischemic cardiomyopathy with severely reduced ejection      fraction in the range of 15%.   CHIEF COMPLAINT:  Chest pain, shortness of breath of several days duration.   HISTORY OF PRESENT ILLNESS:  [name] is a 77 year old African-American male  from [place], [place], who presented to the emergency room early  this morning complaining of chest pain mainly on the left side of his chest.  He describes pain as a dull ache radiating to his right eye and also to his  neck.  He says the pain is about 7 out of 10 at its worst, and he was  actually diaphoretic and sweaty yesterday.  He does not remember exactly  when the pain started but appeared to have been in the last week or so.  He  says the pain is mainly on the left side of his chest accompanied by some  shortness of breath. He has about 2 to 3 episodes lasting several minutes.  It is not relieved by anything that he does.  It does seem to improve  spontaneously; however, he has noted that the pain is exacerbated by  exertion.   His exercise tolerance has also diminished over the last few weeks.   He was more concerned last night as he woke up very sweaty with the chest  pain.  His wife subsequently brought him in to the hospital.   This is not an initial occurrence of chest pain.  He has had chest pain in  the past and actually had a cardiac catheterization performed on him on July 01, 2002, at [facility].  At the time, he also had a history of  nonsustained ventricular tachycardia. It was noted that his ejection  fraction was 15-20%; however, there was no evidence of ischemia.  This is  based on a prior note dated 27th of February 2005 on the E chart that I  reviewed.   Currently, patient is in the intensive care unit.  Reports his pain is down  to 2 out of 10 from a prior 6 to 8 out of 10.  Patient received  nitroglycerin in the emergency room which he says helped improve his pain.   He had some cough productive of whitish sputum and no blood.  He says there  was no pleuritic  chest pain.  He says pain is not reproducible, and he feels  that it is deep inside his chest.  Patient's pain is similar to past pain  that he has had when his cardiac enzymes were negative and a cardiac cath  was performed.   He denies any fever, chills, or rigors.  He has had no headache but has  occasional dizziness and some lightheadedness.   He denies any orthopnea or paroxysmal nocturnal dyspnea.  He has had no  pitting pedal edema.   He is fairly active and was actually in church yesterday when he had another  episode of the chest pain.   Review of systems is as mentioned in the history of present illness above. A  12-point review of systems was done which was positive for chronic left  ankle pain, history of episodic rectal bleeding.  He had no history of  weight loss or hemoptysis.  He denies any dysuria or dysphagia.   PAST MEDICAL HISTORY:  1.  History of rectal bleed.  Patient had a colonoscopy and was found to      have multiple colonic polyps with extensive diverticulosis.  He has had      multiple polypectomies which are negative for colon cancer.  2.  He has hypertension.  3.  Nonischemic cardiomyopathy with an ejection fraction of 15 to 20% by      cardiac catheterization on July 01, 2002, at [facility] [place]      [place]  4.  History of nonsustained  ventricular tachycardia.  5.  Osteoarthritis.  6.  Medical history is negative for diabetes, tobacco use, history of sickle      cell disease, asthma, or seizure disorder.   ALLERGIES:  CEFADYL which patient reports causes an itchy rash.  Also SULFA.   His medications include:  1.  Clarinex 5 mg p.o. p.r.n.  2.  Aricept 5 mg p.o. daily.  3.  Altace 10 mg every day.  4.  Zoloft 50 mg p.o. daily.  5.  Spironolactone 25 mg p.o. daily.  6.  Coumadin.  Patient is currently on a blinded study on aspirin versus      Coumadin, and the dose of his Coumadin is unclear.  7.  Aspirin 81 mg p.o. daily.  8.  Lasix 20 mg p.o. b.i.d.   FAMILY HISTORY:  Mother deceased at age 46 secondary to complications of  heart disease.  Father also died at age 31 secondary to heart attack.  One  brother also deceased at age 86 secondary to heart disease.  One brother  living age 27 with history of prostate cancer.  Three daughters living age  9, health unknown, age 66 with history of mental retardation, and 13 with  history of asthma.   SOCIAL HISTORY:  Patient drinks alcohol occasionally.  However, quit smoking  more than 20 years ago.   He denies any IV drug abuse or cocaine use.  He is married, and his wife was  present during the history and physical.   He is independent in his activities of daily living, and he remains very  active.   PAST SURGICAL HISTORY:  1.  Left ankle surgery secondary to fracture treated with open reduction and      internal fixation at [facility].  2.  Transurethral prostatectomy secondary to benign prostatic hypertrophy      November 2001.  3.  History of hospitalization for pulmonary edema secondary to  cardiomyopathy in June 2004 at [facility].   PHYSICAL EXAM:  GENERAL: He was fully conscious and alert.  He was  comfortable.  He was pleasant and not in acute distress. VITAL SIGNS:  His blood pressure at the time I saw him on the floor  was  138/86, pulse of 62, respiratory rate of 15, temperature was 97.8 degrees  Fahrenheit.  Patient's oxygen saturation was 100% on 2 liters.  HEENT:  Normocephalic and atraumatic.  Oral mucosa was moist with no  exudates.  NECK:  Supple. No JVD, lymphadenopathy.  LUNGS:  Had occasional crackles at the bases.  Otherwise, was mostly clear.  HEART:  S1, S2 was regular.  There was no S3, S4, gallops or rubs. There  were occasional ectopic beats.  ABDOMEN:  Soft, nontender.  Bowel sounds were positive.  No masses palpable.  No hepatosplenomegaly.  EXTREMITIES: Patient had no pitting pedal edema.  No calf induration or  tenderness was noted.   LABORATORY/DIAGNOSTIC STUDIES DONE:  PT was 12.7, INR 0.9, PTT of 32.  His D-  dimer was 1.44. Sodium was 141, potassium 3.4, chloride 111, CO2 23, glucose  109, BUN 25, creatinine 1.5, calcium 9.1. His cardiac enzymes was 1. Total  CK was 140.  CK-MB was 2.6.  Relative index of 1.9.  Troponin was 0.06.  His  BMP was less than 30.   His chest x-ray show his lungs were hyperinflated, but there was no evidence  of congestive heart failure.  There was some cardiomegaly.   EKG:  EKG shows normal sinus rhythm with bifascicular block.  This EKG was  compared to an EKG from his previous admission back on July 05, 2002, and  EKG was unchanged.   ASSESSMENT AND PLAN:  [name] is a 77 year old African-American male with  past medical history of nonischemic cardiomyopathy with ejection fraction 15-  20% at a recent cardiac catheterization about a year ago.  He may have had a  stress test recently too perhaps 6 months ago as per patient, although I do  not have any records of this.  He presents to the emergency room with  shortness of breath and chest pain.  His chest pain is fairly atypical for  probable coronary artery disease, but in view of patient's risk factors  including his hypertension and significant cardiomegaly noted on chest x-ray  patient will  be admitted to rule out for myocardial infarction.  Patient  will have serial cardiac enzymes.  He is also on Lovenox.  The nurse in the  ICU, Elnita Maxwell, has contacted the research coordinators, and they are aware of  patient's admission, and protocol has been dictated.  It was recommended to  continue patient's Coumadin.  Patient was placed on 10 mg of Coumadin.  He  will also continue on his Lovenox.  Patient's chest pain may be related to some mild bronchitis, likely a viral  bronchitis causing significant cough with muscle strain, although on  physical exam his chest pain was not reproducible when I put pressure on his  left chest.   He currently is quite stable.  We will continue him on the telemetry  monitor.  I will put him on some nitro paste too around the clock.   A cardiology consult has been activated.   I will resume all patient's prior home medications.  CODE STATUS:  Patient is a full code at this time.   The above was explained to the patient and the importance  of admission for  chest pain to rule out any evidence of myocardial infarction.  His cardiac  enzymes appear slightly elevated although they are about the upper limits of  normal.  This was also communicated to the patient.  His wife was present,  and they both verbalized full understanding of the plan above.   It appeared that patient was also offered a defibrillator/pacemaker about 6  months ago in [facility] which he declined for reasons of not  wanting to have metal implanted in his chest.   I will defer this to cardiology.       AM/MEDQ  D:  10/01/2003  T:  10/01/2003  Job:  [id number]

## 2010-06-01 NOTE — H&P (Signed)
[name] Macias, [name] Macias                             ACCOUNT NO.:  [id number]   MEDICAL RECORD NO.:  [id number]                   PATIENT TYPE:  INP   LOCATION:  A226                                 FACILITY:  [facility]   PHYSICIAN:  Annia Friendly. Loleta Chance, M.D.                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  He complains of shortness of breath.  The patient has been noticing  increased fatigue and dyspnea on exertion over five days.  On the morning of  June 29, 2002, after midnight, he experienced abrupt shortness of breath  while in bed.  Shortness of breath caused the patient to sit upright to get  his breath.  The patient got out of bed due to the severity of shortness of  breath and remained upright in a recliner the remainder of the night.  He  experienced diaphoresis and tightness of chest.  Wife has noticed a chronic  cough x6 months.  He denied nausea, vomiting, chest pain, wheezing and edema  of legs.  History is also negative for syncope, arm pain and jaw pain.   PAST MEDICAL HISTORY:  1. Cardiomyopathy by echocardiogram in December 2003.  2. Hypertension.  3. Osteoarthritis.  4. Diverticulosis.  5. Positive TB skin test.  6. Negative for diabetes, cancer, sickle cell, asthma and seizure disorder.  7. Urosepsis in 2001.   MEDICATIONS:  1. Pravachol 20 mg p.o. every day.  2. Aspirin 81 mg p.o. q.d.  3. Altace 10 mg p.o. every day.  4. Chlorthalidone 25 mg 1/2 tablet p.o. every day.   ALLERGIES:  SULFA (rash.   HABITS:  Positive for former use of ethanol and tobacco products for 16-17  years.   PAST SURGICAL HISTORY:  1. Polypectomy secondary to rectal bleeding as an outpatient.  2. Left ankle surgery secondary to fracture treated  with open reduction     internal fixation.  3. Transurethral resection of prostate due to benign prostatic hypertrophy     in September 2001, by Dr. Ky Barban.   FAMILY HISTORY:  Mother deceased at age 67 secondary to heart disease.  Father deceased at age 35 due to heart attack.  One brother deceased at age  78 secondary to heart disease.  One brother living at age 31 with history of  prostate cancer.  Three daughters living at age 33, 6 with history of  mental retardation and age 26 with history of asthma.   REVIEW OF SYMPTOMS:  MUSCULOSKELETAL:  Positive for intermittent left ankle  pain, chronic left ankle swelling.  GENITOURINARY:  Trouble holding urine at  times.  Negative for chronic headache, epistaxis, bleeding gums,  hemoptysis,  hematemesis, gross hematuria, dysuria, melena, diarrhea, constipation,  weight loss, unexplained fevers, night sweats, etc.   PHYSICAL EXAMINATION:  VITAL SIGNS:  Temperature 97.2, blood pressure  138/81, pulse 80, respirations 24.  GENERAL:  Elderly, medium framed, alert, African-American male who appeared  to feel significantly better in no apparent respiratory distress.  SKIN:  Warm and dry.  HEENT:  Head normocephalic.  Normal auricles.  Lids negative for ptosis,  sclerae white, pupils equal round and reactive to light.  Extraocular  movements intact.  Nose with negative discharge.  Mild dentition good.  No  bleeding gums.  Positive missing teeth.  NECK:  Negative for jugular venous distention.  No adenopathy or  thyromegaly.  Supraclavicular space with no palpable nodes.  LUNGS:  Clear on auscultation.  HEART:  Audible S1, S2 without murmurs, rubs or gallops.  Regular rate and  rhythm.  ABDOMEN:  Slightly obese, hypoactive bowel sounds, soft, nontender in all  four quadrants.  No palpable mass or organomegaly.  GENITALIA:  Penis uncircumcised.  No penile lesion or discharge.  Scrotum  with palpable testicle with some atrophy.  No  nodule on palpation.  RECTAL:  Deferred.  EXTREMITIES:  No edema.  Left ankle with swelling.  Positive old healed  surgical scar.  Nonpalpable dorsalis pedis bilaterally.  NEUROLOGIC:  Alert and oriented to person, place and time.  Cranial nerves 2-  12 grossly intact.   LABORATORY DATA AND X-RAY FINDINGS:  White count 5.2, hemoglobin 12.5,  hematocrit 37.3, platelets 284,000.  Sodium 134, potassium 3.4, chloride  106, CO2 26, glucose 100, BUN 17, creatinine 1.1.  Calcium 9.2.  Total CPK  164, CK-MB 3.1.  Troponin I of 0.16.  B-type natriuretic peptide 568.0.   EKG demonstrated normal sinus rhythm with a rate of 75 with left axis  deviation, left ventricular hypertrophy by voltage, right bundle branch  block.  Chest x-ray demonstrated congestive heart failure with mild edema.   In the emergency room, the patient was treated with oxygen, IV Lasix and  other supportive measures.  He responded with beginning diuresis in the  emergency room.  Moreover, the patient has been evaluated by cardiology  since admission.  Changes have been made in medications by cardiology.   IMPRESSION:  1. Primary acute pulmonary edema secondary to cardiomyopathy secondary to     hypertensive disease.  2. Diverticulosis.  3. Osteoarthritis.  4. Positive tuberculosis test.  5. Hyperlipidemia.   PLAN:  1. Lasix 40 mg IV q.12h. with daily weights.  2. Repeat cardiac enzymes q.8h. x2.  3. Magnesium level.  4. Thyroid panel.  5. Urinalysis.  6. Echocardiogram.  7. A 2 g sodium, low cholesterol diet.  8. Fasting lipid profile.  9. Aspirin 81 mg p.o. every day.  10.      Head of bed at 35 degrees.  11.      Oxygen at 2 L per minute via nasal cannula.  12.      Aldactone 12.5 mg p.o. every day.  13.      Captopril 25 mg p.o. t.i.d.  Repeat at 7 a.m.                                               Annia Friendly. Loleta Chance, M.D.   Levonne Hubert  D:  06/29/2002  T:  06/29/2002  Job:  [id number]

## 2010-06-01 NOTE — Procedures (Signed)
NAMEHARTFORD, MAULDEN                 ACCOUNT NO.:  [id number]   MEDICAL RECORD NO.:  [id number]          PATIENT TYPE:  INP   LOCATION:  A213                          FACILITY:  [facility]   PHYSICIAN:  Vida Roller, M.D.   DATE OF BIRTH:  [birth year]   DATE OF PROCEDURE:  DATE OF DISCHARGE:                                    STRESS TEST   BRIEF HISTORY:  [name] Macias is a 77 year old gentleman with a history of  nonischemic cardiomyopathy.  A recent catheterization in June of 2004  revealed nonobstructive coronary disease with 40% proximal stenosis in the  RCA.  He does have atypical chest discomfort, and his cardiac enzymes x3  have been negative for acute myocardial infarction.   BASELINE DATA:  EKG revealed sinus rhythm at 63 beats per minute with  bifascicular block.  Blood pressure is 152/68.   Adenosine 47 mg was infused over a four minute protocol with Cardiolite  injected at three minutes.  The patient reported tachy palpitations which  resolved in recovery.  EKG revealed no ischemic changes. He did have a  bundle branch block throughout, and a few PVC's were noted.   Final images and results are pending M.D. review.      AB/MEDQ  D:  10/04/2003  T:  10/04/2003  Job:  [id number]

## 2010-06-01 NOTE — Letter (Signed)
August 22, 2005     [name] Macias. Lodema Hong, MD  [address]  [place], [place] [address]   RE:  [name] Macias, [name] Macias  MRN:  [id number]  /  DOB:  1932/12/09   Dear [name]:   [name] Macias returns to the office for continued assessment and treatment of  nonischemic cardiomyopathy.  His wife is frustrated that he is not improved  since AICD/biventricular device implantation.  Unfortunately, he has had a  chronic cough since then and has had many of his cardiac medications  discontinued.  Current medications include a study drug which is either  aspirin or warfarin, spironolactone 25 mg daily, KCl 20 mEq daily, Aricept  10 mg daily, furosemide 40 mg daily, Zegerid 40 mg a.c. and a Proventil  inhaler b.i.d.   On exam, pleasant gentleman in no acute distress.  The weight is 178, 5  pounds less than 3 months ago.  Blood pressure 120/80, heart rate 85 with  multiple prematures.  NECK:  No jugular venous distension.  LUNGS:  Left basilar rales.  CARDIAC:  Normal first and second heart sounds; fourth heart sound present.  ABDOMEN:  Soft and nontender.  No organomegaly.  EXTREMITIES:  No edema.   IMPRESSION:  [name] Macias has an exam that is not impressive for congestive  heart failure but he has had significant symptoms.  We will proceed with a  chest x-ray, BNP level, D-dimer level and basic lab tests.  It is imperative  that a good medical regime be reestablished.  He will start  Diovan 80 mg b.i.d. and Toprol 25 mg daily.  We will titrate those upwards.  I will continue to follow him closely until his current issues are resolved.  He may need referral to a pulmonologist for assessment of chronic cough.    Sincerely,      Gerrit Friends. Dietrich Pates, MD, [facility]   RMR/MedQ  DD:  08/22/2005  DT:  08/22/2005  Job #:  [id number]

## 2010-06-01 NOTE — Letter (Signed)
October 28, 2005    Milus Mallick. Lodema Hong, M.D.  [address], [address]  [place], [place] [address]   RE:  SHIVAAN, TIERNO  MRN:  [id number]  /  DOB:  04-20-1932   Dear [name]:   Mr. Berkley returns to the office for continued assessment and treatment of  nonischemic cardiomyopathy.  Since his last visit, he has done well from a  cardiac standpoint.  He experienced a sensation of his throat closing,  eventually reaching Dr. Karilyn Cota who found him to have an esophageal  stricture, which was dilated.  He has been remarkably better since then.  He  performs all of his household duties without any dyspnea or chest  discomfort.   CURRENT MEDICATIONS:  1. A WARCEF study drug, either aspirin or Warfarin.  2. Aldactone 25 mg every day.  3. Furosemide 40 mg b.i.d.  4. Zegerid 40 mg a.c.  5. Aricept 10 mg every day.  6. KCl 40 mEq every day.  7. Metoprolol 50 mg b.i.d.  8. Diovan 160 mg every day.  9. Lorazepam 0.5 mg b.i.d.  10.Nexium 40 mg every day.   PHYSICAL EXAMINATION:  GENERAL:  A pleasant gentleman in no acute distress.  VITAL SIGNS:  The weight is 172, 5 pounds less than in August, blood  pressure 105/65, heart rate 68 with multiple prematures, respirations 16.  NECK:  No jugular venous distention.  Normal carotid upstrokes without  bruits.  LUNGS:  Clear.  CARDIAC:  Normal first heart sound; increased intensity of the second heart  sound; fourth heart sounds present; minimal apical holosystolic murmur.  ABDOMEN:  Soft and nontender; aortic pulsation not appreciated; no bruits.  EXTREMITIES:  Trace edema.   IMPRESSION:  Mr. Fye is doing very well with current medical therapy.  A  chemistry profile was recently normal with potassium of 3.7.  Since this is  on the low side, his daily dose of potassium will be increased to 60 mEq per  day in divided doses.  Vaccinations are up to date.  We will check a  chemistry profile and CBC in 2 months.  Stool for hemoccult testing will  also  be obtained.  I will plan to see this nice gentleman again in 6 months.    Sincerely,     Gerrit Friends. Dietrich Pates, MD, [facility]    RMR/MedQ  /  Job #:  [id number]  DD:  10/28/2005 / DT:  10/29/2005

## 2010-06-01 NOTE — H&P (Signed)
[name] Macias, [name] Macias                             ACCOUNT NO.:  [id number]   MEDICAL RECORD NO.:  [id number]                   PATIENT TYPE:  INP   LOCATION:  A206                                 FACILITY:  [facility]   PHYSICIAN:  Annia Friendly. Loleta Chance, M.D.                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   CHIEF COMPLAINT:  Rectal bleeding.   HISTORY OF PRESENT ILLNESS:  The rectal bleeding occurred x3 on the day of  admission. Rectal bleeding started at 0900 at home. The bleeding was  described as bright red on each occasion. Estimated blood loss was initially  2 tablespoons full. The second episode of blood loss was at 12 noon and  estimated blood loss was 1 and 1/2 cup full's. The third episode of rectal  bleeding occurred at 1500 with an estimated blood loss of 1 cup full. The  patient complained of feeling slightly dizzy, experiencing lower abdominal  cramping before passing blood rectally and sweating spells during passing of  blood. He denies syncope, chest pain, nausea, vomiting, and shortness of  breath.   PAST MEDICAL HISTORY:  Positive for diverticulosis. The patient underwent  colonoscopy in June of 2000 and was found to have multiple colonic polyps  with extensive diverticulosis. In the ascending colon, he had a wide based  polyp that was snared and cauterized but it was felt not to be adequately  excised. The base was cauterized. It was felt that this was not adequately  done and there was a plan for outpatient followup colonoscopy. The patient  had 4 other polyps that were either excised or destroyed. Medical history is  negative for colon cancer, peptic ulcer disease, inflammatory bowel disease,  and bleeding  disorder. The patient denied syncope, chest pain, nausea,  vomiting and shortness of breath. Medical history is positive for  hypertension, non-ischemic cardiomyopathy (ejection fraction of 15% to 20%)  by cardiac catheterization on July 01, 2002 at [facility] in  [place], [place]. C.), history of non-sustained ventricular tachycardia and  osteoarthritis. Medical history is negative for diabetes, tuberculosis,  sickle cell, asthma, or seizure disorder.   ALLERGIES:  CEFADYL (itchy rash).   MEDICATIONS:  1. Aricept 5 mg p.o. q.d.  2. Lasix 20 mg p.o. b.i.d.  3. Clarinex 5 mg p.o. q.d.  4. Altace 10 mg p.o. q.d.  5. Zoloft 50 mg p.o. q.d.  6. Spironolactone 25 mg p.o. q.d.  7. Astelin nasal spray 1 spray each nostril  8. Coumadin 2.5 mg 3 tablets p.o. every Tuesday and Friday of each week and     4 tablets on Monday, Wednesday, Thursday, Saturday, and  Sunday of each     week.  9. One aspirin p.o. every day.   HABITS:  Positive for ethanol and tobacco use times 17 years.   PAST SURGICAL HISTORY:  Positive for left ankle surgery secondary to a  fracture treated with open reduction internal fixation at [facility] in [place], [place]; transurethral prostatectomy  secondary to benign prostatic hypertrophy in November of 2001 by Dr. Jerre Simon  and hospitalization for urosepsis by Dr. Syliva Overman in August of 2001  at [facility]  and hospitalization for pulmonary edema secondary to  cardiomyopathy secondary to hypertensive disease in June of 2004 at [facility].   FAMILY HISTORY:  Mother deceased age 57 secondary to complications of heart  disease; father deceased age 52 secondary to heart attack; one brother  deceased age 23 secondary to heart disease; one brother living age 49 with  history of prostate cancer; 3 daughters living age 62 health unknown, age 29  with history of mental retardation, and age 48 with history of  asthma.   REVIEW OF SYSTEMS:  Positive for chronic left ankle pain, episodic rectal  bleeding, insomnia intermittently. Negative for tinnitus, headache  frequently, epistaxis, bleeding gums, hemoptysis, weight loss, unexplained  fever, hematemesis, gross hematuria, dysuria, edema of legs, dysphagia, etc.   PHYSICAL EXAMINATION:  VITAL SIGNS:  Temperature 97.1, blood pressure 96/64,  pulse 72, and respiratory rate 20 at 1637 in the emergency room.  Blood  pressure reading on the medical floor lying 127/71 with pulse of 62. Blood  pressure sitting 124/76 with pulse of 67. Blood pressure standing 126/88  with pulse of 71, all in left arm.  GENERAL:  An elderly, medium frame, medium height, black male in no  apparently respiratory distress.  HEENT:  Head normocephalic. Ears, normal auricle. External canal patent.  Tympanic membrane pearly gray. Eyes, lids negative for ptosis. Sclera white.  Pupils round, equal, and reactive to light. Extraocular movement intact.  Positive missing teeth. Remainder of dentition fair. No bleeding gums.  Posterior pharynx benign.  NECK:  Negative for lymphadenopathy or thyromegaly. No carotid bruits on  auscultation, supraclavicular space. No palpable node.  LUNGS:  Clear.  HEART:  Audible S1 and S2 without murmur. Regular rate and rhythm.  ABDOMEN:  Slightly obese. Hypoactive bowel sounds. Soft, nontender all 4  quadrants. No palpable mass. No organomegaly.  GENITALIA:  Penis uncircumcised. No penile lesions or discharge. Scrotum,  palpable testicle without nodule or tenderness.  RECTAL:  No external lesion. This examination was done by emergency room  physician and was guaiac negative.  EXTREMITIES:  No edema. No joint swelling. No joint redness. No joint  hotness. A weak palpable dorsalis pedis.  NEUROLOGIC:  Alert and oriented to person, place, and time. Cranial nerves 2-  12 appeared intact.  LABORATORY DATA:  CT of the abdomen demonstrated  diverticulosis; no  inflammatory changes; negative for rectal mas or liver lesions; appendix  appeared within normal limits.   White count 6.6, hemoglobin 13.0, hematocrit 38.2. Platelet 319,000.  Prothrombin time 12.6. INR 0.9. Sodium 138, potassium 3.7, chloride 105, CO2  28, glucose 114, BUN 28, creatinine 1.4.   IMPRESSION:  Primary rectal bleeding secondary to diverticulosis.   SECONDARY DIAGNOSIS:  1. Non-ischemic cardiomyopathy.  2. History of non-sustained ventricular tachycardia.  3. 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  4. Osteoarthritis.   PLAN:  Clear liquid diet. IV normal saline at 50 cc per hour. Schedule for  diagnostic EGD and  colonoscopy on March 15, 2003 by Dr. Maggie Schwalbe. Protonix 40  mg IV q.d. Retic count and anemia panel. Oxygen at 2 liters per minute via  nasal cannula, humidified. Preparation for colonoscopy. Repeat CBC. A  consent form for procedures.     ___________________________________________                                         Annia Friendly. Loleta Chance, M.D.   Levonne Hubert  D:  03/13/2003  T:  03/14/2003  Job:  [id number]

## 2010-06-01 NOTE — Op Note (Signed)
[name] Macias, [name] Macias                 ACCOUNT NO.:  [id number]   MEDICAL RECORD NO.:  [id number]          PATIENT TYPE:  AMB   LOCATION:  DAY                           FACILITY:  [facility]   PHYSICIAN:  Lionel December, M.D.    DATE OF BIRTH:  1932-12-24   DATE OF PROCEDURE:  10/25/2005  DATE OF DISCHARGE:  10/25/2005                                 OPERATIVE REPORT   PROCEDURE:  Esophagogastroduodenoscopy and esophageal dilation.   INDICATION:  [name] is a 77 year old African American male who has been  experiencing throat symptoms.  He feels he has lump in his throat as if  something is stuck.  He was evaluated by R. Warnell Forester, M.D.  He had a  barium study.  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  He was therefore referred  for diagnostic EGD.  He states his symptom at times has been intractable and  he has felt that he just could not deal with it.  He denies heartburn or  regurgitation.  The patient's Coumadin has been on hold for 4 days.  Procedure risks were reviewed with the patient, informed consent was  obtained.   MEDS FOR CONSCIOUS SEDATION:  Benzocaine spray for pharyngeal topical  anesthesia, Demerol 25 mg IV, Versed 3 mg IV.   FINDINGS:  Procedure performed in endoscopy suite.  The patient's vital  signs and O2 sat were monitored during the procedure and remained stable.  The patient was placed left lateral position and Olympus videoscope was  passed oropharynx without any difficulty into esophagus.   Laryngeal inlet was well seen and the vein was normal.   Esophagus. Mucosa of the esophagus was normal.  Proximal segment was well  seen on the way out and no obvious web or stricture was noted.  GE junction  was at 42 cm and hiatus at 44.  There was small sliding hiatal hernia.   Stomach.  It was empty and distended very well with insufflation.  Folds of  proximal stomach were normal.  Examination of mucosa  at body, antrum,  pyloric channel, as well as angularis, fundus and cardia was normal.   Duodenum. Bulbar mucosa was normal.  Scope was passed in the second part of  the duodenum where mucosa and folds were normal.  Endoscope was withdrawn.   Esophagus was empirically dilated by passing 54 and 56-French Maloney  dilators full insertion.  Esophageal mucosa was reexamined post esophageal  dilation and there was no mucosal disruption noted.  The patient tolerated  the procedure well.   FINAL DIAGNOSIS:  Small sliding hiatal hernia but no evidence of esophagitis  or stricture.   Normal exam of the stomach to second part of the duodenum.   Esophagus empirically dilated given his unexplained symptomatology, but no  mucosal disruption noted.   It is possible that his symptoms may be due to atypical manifestation of  GERD.   RECOMMENDATIONS:  Antireflux measures.  Nexium 40 mg p.o. q.a.m.Marland Kitchen  He will  pick up a few weeks supply from our  office.  He will call us with a progress  report in 1-2 weeks.  If symptoms persist, would refer him to tertiary  center for esophageal manometry with particular attention to the UES.      Lionel December, M.D.  Electronically Signed     NR/MEDQ  D:  10/25/2005  T:  10/28/2005  Job:  [id number]   cc:   Stana Bunting, M.D.  Fax: [phone]   Milus Mallick. Lodema Hong, M.D.  Fax: [phone]

## 2010-06-01 NOTE — Procedures (Signed)
   NAMEMCKENNON, ZWART                             ACCOUNT NO.:  [id number]   MEDICAL RECORD NO.:  [id number]                   PATIENT TYPE:  OUT   LOCATION:  RAD                                  FACILITY:  [facility]   PHYSICIAN:  Salina Bing, M.D. [facility]           DATE OF BIRTH:  [birth year]   DATE OF PROCEDURE:  12/31/2001  DATE OF DISCHARGE:                                  ECHOCARDIOGRAM   CLINICAL DATA:  A 77 year old gentleman with dyspnea and a history of prior  myocardial infarction.   RESULTS:  1. Technically adequate echocardiographic study.  2. Normal left atrium, right atrium and right ventricle.  3. Very mild focal calcification of the mitral valve with mild annular     calcification and normal function.  4. Normal aortic valve with very mild annular calcification and minimal     aortic insufficiency.  5. Mild to moderate dilatation of the aortic root; maximal diameter measured     is 4.7 cm.  6. Normal tricuspid and pulmonic valves.  7. Mild left ventricular dilatation with borderline hypertrophy.  The     inferior and posterior segments are thin and akinetic.  Akinesis to     severe hypokinesis extends to the inferoseptal and lateral segments.     Overall, systolic function is moderately to severely impaired with an     estimated ejection fraction of 0.25-0.30.  LV function was evaluated with     the assistance of intravenous echocardiographic contrast.  8. Doppler evidence for decrease left ventricular compliance.  9. Normal IVC.  10.      Comparison with prior study of November 23, 1999; there has been     further left ventricular dilatation and a substantial decline in left     ventricular systolic function.                                               Howard Bing, M.D. [facility]    RR/MEDQ  D:  12/31/2001  T:  01/01/2002  Job:  [id number]

## 2010-06-01 NOTE — Consult Note (Signed)
[name] Macias, [name] Macias                             ACCOUNT NO.:  [id number]   MEDICAL RECORD NO.:  [id number]                   PATIENT TYPE:  INP   LOCATION:  3714                                 FACILITY:  [facility]   PHYSICIAN:  Doylene Canning. Ladona Ridgel, M.D.               DATE OF BIRTH:  02-Oct-1932   DATE OF CONSULTATION:  07/02/2002  DATE OF DISCHARGE:  07/02/2002                                   CONSULTATION   REFERRING PHYSICIAN:  Learta Codding, M.D.   INDICATIONS FOR CONSULTATION:  Evaluation  of nonsustained ventricular  tachycardia in the setting of dilated cardiomyopathy.   HISTORY OF PRESENT ILLNESS:  The patient is a very pleasant 77 year old man  who was admitted in transfer from the [facility] after presenting  there with congestive heart failure. The patient has had no diagnosis of  heart failure in the past. He does have a history of hypertension. By 2D  echocardiogram his ejection fraction was 25% to 30%. He had peripheral as  well as pulmonary edema which was not decompensated. On telemetry in the  hospital the patient has had documented nonsustained ventricular tachycardia  at cycle lengths of 460 milliseconds. He is referred for additional  evaluation.   The patient denies any history of syncope. He denies any history of dizzy  spells, although he does have very mild orthostasis type symptoms at times.  He denies alcohol abuse. His wife states that he snores.   He had a history of chest pain and subsequent catheterization during this  hospitalization which demonstrated very minimal nonobstructive coronary  artery disease. His right heart  numbers demonstrated a PA pressure of  20/15. His cardiac index was 2.1.   PAST MEDICAL HISTORY:  1. As previously noted.  2. Benign prostatic hypertrophy.  3. He has had hypertension for many years.   ALLERGIES:  SULFA.   MEDICATIONS ON ADMISSION:  Altace, diuretic and aspirin.   SOCIAL HISTORY:  The patient is married  and lives in [place]. He has a  remote history of alcohol  use and quit  smoking  approximately  2 years  ago.   FAMILY HISTORY:  Notable for his mother dying of heart disease at age 78 and  his father dying of MI at 37. He had one brother who died at age 30 of an  MI.   REVIEW OF SYSTEMS:  Notable for no fevers or chills. He does have a history  of night sweats. He denies headache, vision or hearing problems. He does  have chest pain and shortness of breath and PND. He denies syncope. He has  no palpitations. He denies arthritic symptoms but does have some arthralgias  with mowing the grass. He denies dysuria, hematuria or nocturia. He denies  weakness, numbness, depression or anxiety. He denies nausea or vomiting,  diarrhea or constipation.   PHYSICAL EXAMINATION:  GENERAL:  He is a pleasant, well appearing 77-year-  old man in no distress.  VITAL SIGNS:  Blood pressure 110/80, pulse 84 and regular, respirations 18.  HEENT:  Normocephalic and atraumatic. Pupils equal and round. Oropharynx was  moist. Sclerae anicteric.  NECK:  No jugular venous distention. No thyromegaly. Carotids were 2+ and  symmetric.  CARDIOVASCULAR:  Regular rate and rhythm with normal S1 and S2. No rubs.  There was a very soft systolic murmur at the apex.  LUNGS:  Clear to auscultation bilaterally.  ABDOMEN:  Soft, nontender, nondistended. No organomegaly.  EXTREMITIES:  No clubbing, cyanosis or edema.  NEUROLOGIC:  He is alert and oriented x3 with cranial nerves 2 to 12 grossly  intact. Strength was 5/5 and symmetric.   LABORATORY DATA:  An EKG revealed normal sinus rhythm with a right bundle  branch block and left anterior fascicular block.   IMPRESSION:  1. Nonischemic cardiomyopathy.  2. Nonsustained ventricular tachycardia, presently asymptomatic.  3. Class 2 congestive heart failure.  4. Hypertension.   DISCUSSION:  At present there is no indication for ICD therapy. Despite the  results of  the __________ trial, ICD implantation for patients with  nonischemic cardiomyopathies is presently not indicated in the absence of  syncope or sustained ventricular tachycardia. I would recommend continuation  of beta blockers, ACE inhibitors and diuretics as needed. Aldactone may also  be of benefit, although I think this is uncertain.   I have instructed him on the importance of a low salt diet and  regular  exercise. In addition I have instructed the patient to call us or come to  the emergency room if he has any syncopal episode.                                               Doylene Canning. Ladona Ridgel, M.D.    GWT/MEDQ  D:  07/02/2002  T:  07/04/2002  Job:  [id number]   cc:   Annia Friendly. Loleta Chance, M.D.  [address]  [place]  [place] [address]  Fax: [phone]   Malo Bing, M.D.   Kathrine Cords, R.N. [facility]

## 2010-06-01 NOTE — Discharge Summary (Signed)
[name] Macias, [name] Macias                             ACCOUNT NO.:  [id number]   MEDICAL RECORD NO.:  [id number]                   PATIENT TYPE:  OUT   LOCATION:  RESP                                 FACILITY:  [facility]   PHYSICIAN:  Annia Friendly. Loleta Chance, M.D.                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   Chief complaint was shortness of breath.  The patient had been experiencing  increased fatigue and shortness of breath on exertion over five days.  On  the morning of June 29, 2002, after midnight, he experienced shortness of  breath while in bed.  Shortness of breath caused the patient to sit upright  to get his breath.  The patient got out of bed due to severe shortness of  breath and remained upright and reclined the remainder of the night.  He  also experienced sweating and tightness of chest. Wife noticed a chronic  cough x6 months.  He denied nausea, vomiting, chest pain, wheezing, and  edema of legs.  Moreover, he denied syncope, arm pain or jaw pain.   PAST MEDICAL HISTORY:  1. Cardiomyopathy by echocardiogram in December 2003.  2. Hypertension.  3. Osteoarthritis.  4. Diverticulosis.  5. Positive TB skin test.  6. Left ankle surgery secondary to fracture, treated with open reduction and     internal fixation.  7. Transurethral resection of prostate due to benign prostatic hypertrophy     in September 2001 by  Dr. Alleen Borne.   PAST HOSPITALIZATIONS:  Urosepsis in 2001.   ALLERGIES:  SULFA DRUGS (RASH).   HABITS:  Positive for former use of ethanol and tobacco products for 16-17  years.   FAMILY HISTORY:  Mother deceased at age 39 secondary to heart disease.  Father deceased at age 28 due to heart attack.  One brother deceased at age  35 secondary to heart disease.  One brother living at age 52 with history of  prostate cancer.  Three daughters, living; age 79, health unknown; age 43  with history of mental retardation, and age 62 with history of asthma.   PHYSICAL EXAMINATION:  VITAL SIGNS:  Pulse 73, blood pressure 130/92,  respirations 20 at the time of evaluation on the floor.  NECK:  Mild jugular venous distension at the time of cardiology exam.  LUNGS:  Lung field demonstrated bibasilar crackles at the time of cardiology  evaluation.  However, the patient had diuresed at the time of my exam, and  lung fields were clear.  ABDOMEN:  No hepatomegaly.  EXTREMITIES:  No edema.  NEUROLOGIC:  The patient was neurologically intact.   LABORATORY DATA:  White count 5.2, hemoglobin  12.5, hematocrit 37.3,  platelets 284,000.  Sodium 134, potassium 3.4, chloride 106, CO2 26, glucose  100, BUN 17, creatinine 1.1, calcium 9.2.  Total CPK 164, CK-MB 3.1,  troponin I 0.16, B-type natriuretic peptide 568 pcg/mL (normal 0-100).  Chest x-ray was read as congestive heart failure with mild interstitial  pulmonary edema by Dr. __________ Jean Rosenthal.  EKG on June 29, 2002, at 10:20  demonstrated sinus rhythm with left ventricular hypertrophy, right bundle  branch block, and LAFB.  EKG also demonstrated left axis deviation.   HOSPITAL COURSE:  Problem 1.  Congestive heart failure secondary to  cardiomyopathy secondary to hypertensive cardiovascular disease.  The  patient was admitted for treatment.  He was admitted to telemetry.  He was  treated with oxygen, IV Lasix, ACE inhibitor, order for echocardiogram, one  aspirin 81 mg p.o. everyday, head of bed elevated about 35 degrees, order  for repeat cardiac enzymes, daily weight, and consideration of cardiology  consult.  Cardiology did see the patient on the day of admission and felt  that the patient had known cardiomyopathy who now presented with  decompensated congestive heart failure  who also had hypertension which was  poorly controlled and hypokalemia and also atypical chest discomfort.  Cardiology recommended an ACE inhibitor, add some Aldactone, aggressive  diuresis, daily weight, and other supportive measures.  He also encouraged  consideration for ischemic workup, although he doubted seriously if the  chest discomfort was cardiac in etiology.   Cardiolite study when his heart failure was better should be considered.  Echocardiogram on June 29, 2002, was read as the left ventricle was dilated  with evidence of mild concentric left ventricular hypertrophy.  There was  global hypokinesis with an overall ejection fraction estimated at 25-30%.  Diastolic function appeared to be markedly impaired in a restrictive filling  pattern.  The right ventricle was also dilated though not nearly as much as  the left and was mildly hypokinetic.  Both atria were enlarged (left greater  than right).  There was no atrioseptal defect.  The aortic valve was  trileaflet, tricommissural with mild insufficiency.  No stenosis was seen.  The mitral valve was mildly dilated at the annulus.  There was evidence of  mild insufficiency, and no stenosis was seen.  The tricuspid valves were  also dilated at the annulus.  There was mild insufficiency.  No stenosis was  seen.  The pulmonic valve was not well seen.  The ascending aorta was not  well seen.  The anterior vena cava appeared to be normal size but does not  change with respiratory variation.  The __________ appeared to be normal as  read by Dr. Dionicio Stall.  Repeat cardiac enzymes on June 29, 2002, at 1823  revealed the following:  Total CPK 149, CK-MB 2.7, troponin I 0.17; June 29, 2002, at 0215, total CPK 126, CK-MB 2.3, troponin I 0.18.  Thyroid function  revealed the following:  T3 uptake 46.5%, TSH 2.325 IU/mL, and free T4 1.33 ng/dl.  Repeat electrolytes on June 29, 2002, revealed the following:  Sodium 140, potassium 3.4,  chloride 104, CO2 28, glucose 98, BUN 19,  creatinine 1.3.  The patient was continued on potassium supplement.   The patient incurred an episode of V-Tach on June 30, 2002, at 1824, being  asymptomatic.  Run was significant.  Therefore, he was not discharged on  June 30, 2002.  Cardiology saw the patient again on July 01, 2002.  Dr.  Dorethea Clan,  cardiologist, thought the patient should be considered for EP  evaluation for possible ischemic cardiovascular disease.  He was transferred  to [facility] on July 01, 2002 for further cardiac evaluation at a tertiary  care center.  The reason for transfer was discussed with the patient and his  wife.  They were in agreement.  Dr. Dorethea Clan indicated in his note the he had  an episode of nonsustained V-Tach on June 30, 2002, but he is asymptomatic.  Last electrolytes on July 01, 2002, revealed the following:  Sodium 141,  potassium 3.4, chloride 102, CO2 30, BUN 19, creatinine 1.2, glucose 103.  The patient was alert and oriented to person, place, and time at the time of  transfer.  He had no complaints of shortness of breath lying at less than 35  degrees.  He had no complaint of shortness of breath ambulating in the room.  He denied chest pain or dizziness.  Appetite was good.  Extremities had no  edema.  Lung fields were clear at the time of discharge to [facility] for further evaluation.   Problem 2.  Blood pressure was addressed during this hospitalization.  Blood  pressure was addressed with diuretics, low sodium diet, ACE inhibitor, and  Aldactone.  Blood pressure on admission was 130/81.  Examination of the  heart revealed audible S1 and S2 without murmur, rub, or gallop.  Blood  pressure on the morning of discharge was 122/80.  The patient had no  complaint of chest pain or palpitation.   Problem 3.  Osteoarthritis.  The patient had no complaint of joint pain,  joint swelling, joint tenderness, or joint hotness during this   hospitalization.   Problem 4.  Diverticulosis.  Examination of the abdomen demonstrated no  tumors.  The patient experienced no bulimia, hematochezia, or abdominal pain  during this hospitalization.  He was asymptomatic.   Problem 5.  Positive TB skin test.  Chest x-ray demonstrated no sign of  infiltrate or findings suggesting tuberculin infection.   FINAL PRIMARY DIAGNOSIS:  Shortness of breath secondary to pulmonary edema  secondary to cardiomyopathy secondary to hypertensive cardiovascular  disease.   SECONDARY DIAGNOSES:  1. Asymptomatic nonsustained ventricular tachycardia.  2. Osteoarthritis.  3. 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  4. Diverticulosis.   DISCHARGE MEDICATIONS:  1. Captopril 25 mg p.o. t.i.d.  2. Aldactone 12.5 mg p.o. every day.  3. Aspirin 81 mg p.o. every day.  4. K-Dur 20 mEq p.o. b.i.d.  5. Lasix 20 mg p.o. b.i.d.  6. Oxygen p.r.n.                                              Annia Friendly. Loleta Chance, M.D.    Levonne Hubert  D:  07/05/2002  T:  07/05/2002  Job:  [id number]

## 2010-06-01 NOTE — Procedures (Signed)
   NAMECHAMPION, [name] Macias                             ACCOUNT NO.:  [id number]   MEDICAL RECORD NO.:  [id number]                   PATIENT TYPE:  INP   LOCATION:  A226                                 FACILITY:  [facility]   PHYSICIAN:  Vida Roller, M.D.                DATE OF BIRTH:  May 08, 1932   DATE OF PROCEDURE:  06/29/2002  DATE OF DISCHARGE:                                  ECHOCARDIOGRAM   REFERRING PHYSICIAN:  Annia Friendly. Loleta Chance, M.D.   Tape Number [id number], Tape Count [id number].   INDICATIONS FOR PROCEDURE:  This is a 77 year old African-American male with  a known cardiomyopathy and new onset congestive heart failure.   The technical quality of the study was good.   M MODE TRACINGS:  The aorta is 41 mm.   The left atrium is 45 mm.   The septum is 13 mm.   The posterior wall is 12 mm.   Left ventricular diastolic dimension is 57 mm.   Left ventricular systolic dimension is 46 mm.   2D AND DOPPLER IMAGING:  The left ventricle is dilated with evidence of mild  concentric left ventricular hypertrophy.  There is global hypokinesis with  an overall ejection fraction estimated at 25 to 30%.  Diastolic function  appears to be markedly impaired in a restricted filling pattern.   The right ventricle is also dilated though not nearly as much as the left,  and it is mildly hypokinetic.   Both atria are enlarged, left greater than right.  There is no atrial septal  defect.   The aortic valve is trileaflet, tricommissural with mild insuffiencey.  No  stenosis is seen.   The mitral valve is mildly dilated at its annulus.  There is evidence of  mild insufficiency, and no stenosis is seen.   The tricuspid valve is also dilated at its annulus.  There is mild  insufficiency.  No stenosis is seen.   Pulmonic valve is not well seen.   The ascending aorta was not well seen.   The anterior vena cava appears to be normal size but does not change with  respiratory variation.   The  pericardial structures appear to be normal.                                               Vida Roller, M.D.    JH/MEDQ  D:  06/29/2002  T:  06/29/2002  Job:  [id number]

## 2010-06-01 NOTE — Discharge Summary (Signed)
[name] Macias, [name] Macias                 ACCOUNT NO.:  [id number]   MEDICAL RECORD NO.:  [id number]          PATIENT TYPE:  INP   LOCATION:  A213                          FACILITY:  [facility]   PHYSICIAN:  Vania Rea, M.D. DATE OF BIRTH:  1933-01-13   DATE OF ADMISSION:  DATE OF DISCHARGE:  09/20/2005LH                                 DISCHARGE SUMMARY   INCOMPLETE REPORT   PRIMARY CARE PHYSICIAN:  Dr. Evlyn Courier.   CARDIOLOGIST:  [facility].   DISCHARGE DIAGNOSES:  1.  Atypical chest pain, resolved.  2.  Acute myocardial infarction, ruled out.  3.  Hypertension, controlled.  4.  History of nonischemic cardiomyopathy with ejection fraction of 15%.   SPECIAL INVESTIGATIONS THIS ADMISSION:  1.  Adenosine Cardiolite showed sized scar in the inferior wall.  No      ischemia.  2.  Left ventricular ejection fraction of about 40%.   DISPOSITION:  Discharge to home.   DISCHARGE CONDITION:  Stable.   DISCHARGE MEDICATIONS:  1.  Aspirin 81 mg daily.  2.  Aricept 5 mg daily.  3.  Altace 10 mg daily.  4.  Zoloft 50 mg daily.  5.  Spironolactone 25 mg daily.  6.  Lasix 20 mg twice daily.  7.  Ziac 2.5/ 6.25, one daily.   CLINICAL STUDY:  Dose of Coumadin with [facility], one dose daily.   HOSPITAL COURSE:  Please refer to admission history and physical. This is a  77 year old gentleman who was admitted with chest pain on September 17,  beginning of the weekend.  Myocardial infarction was ruled out by a series  of cardiac enzymes and serial EKG's. The patient was seen by the [facility] at the end of the weekend, and they decided to do an adenosine  Cardiolite study.  The patient was kept today for the study which study  results are noted above.  The patient has been off nitroglycerin  paste  since yesterday and remains without pain, without shortness of breath.    Follow up with Dr Loleta Chance in 1-2 weeks  Follow up with [facility] in 2-4 weeks     DICTATION  ENDED AT THIS POINT.      LC/MEDQ  D:  10/04/2003  T:  10/04/2003  Job:  [id number]

## 2010-06-01 NOTE — Consult Note (Signed)
NAMEARLESTER, [name] Macias                             ACCOUNT NO.:  [id number]   MEDICAL RECORD NO.:  [id number]                   PATIENT TYPE:  INP   LOCATION:  A226                                 FACILITY:  [facility]   PHYSICIAN:  Vida Roller, M.D.                DATE OF BIRTH:  1932-09-13   DATE OF CONSULTATION:  06/29/2002  DATE OF DISCHARGE:                                   CONSULTATION   CARDIOLOGIST:  Dr. [place] Bing.   HISTORY OF PRESENT ILLNESS:  [name] is a 77 year old African American male  with a history of congestive heart failure secondary to a nonischemic,  dilated cardiomyopathy who presents for admission with two days of  progressive dyspnea on exertion, orthopnea and increased PND.  He also  states that he has an associated tightness with his shortness of breath in  his chest which is relatively constant and has been with him for a number of  years without any significant change.  There were no alleviating or  exacerbating symptoms for his shortness of breath.  He was given 40 mg of  Lasix IV in the emergency department with significant relief of his  shortness of breath and was admitted to the hospital.   PAST MEDICAL HISTORY:  1. Hypertension.  2. Dilated cardiomyopathy, first evaluated in 2001 where he had a stress     Cardiolite which showed an ejection fraction of 37% but no ischemia.     Subsequently had echos which showed a progressively worsening ejection     fraction until December 2003.  He had an EF of 25-30% with mild left     ventricular dilatation, inferior posterior akinesis and moderately     dilated aortic root at it's widest dimension was 4.7 cm.  3. He has benign prostatic hypertrophy status post a TURP in November 2001.   MEDICATIONS PRIOR TO ADMISSION:  1. Altace 10 mg.  2. Chlorthalidone 12.5 mg a day.  3. Aspirin 81 mg a day.   SOCIAL HISTORY:  He lives in [place] with his wife.  He is married.  He  has three children and two  step-children.  He used to smoke cigarettes but  he quit 15 years ago.  He has an extensive past history of alcohol abuse and  this was thought to be potentially one of the causes for his cardiomyopathy.   FAMILY HISTORY:  His father died at age 49 of an enlarged heart.  His mother  died at age 21 of a heart attack.  He has one brother who died at age 5 of  an MI.  He has another brother who is alive and well.   REVIEW OF SYSTEMS:  Essentially noncontributory.   MEDICATIONS IN THE HOSPITAL:  1. In the hospital he is on Altace 10 mg a day.  2. Lasix 20 mg IV q.12h.  3. Aspirin 81 mg  a day.   PHYSICAL EXAMINATION:  GENERAL:  He is a well developed, elderly, African  American male in no apparent distress.  He is alert and oriented  x 4.  VITAL SIGNS:  His pulse is 73.  His blood pressure is 138/92.  His weight is  169 pounds.  He is breathing 20 times a minute.  Resting comfortably in bed.  HEENT:  Unremarkable.  NECK:  Supple.  He has 3 cm of JVP at 45 degrees.  There are no bruits.  CHEST:  Shows decreased breath sounds bilaterally at his bases with  bibasilar crackles.  HEART:  Regular, rate and rhythm with no murmurs, rubs or gallops.  Specifically on his cardiac exam his point of maximal impulse is not  displaced laterally.  ABDOMEN:  Soft, nontender with normoactive bowel sounds.  EXTREMITIES:  Lower extremities are without significant clubbing, cyanosis  or edema.  MUSCULOSKELETAL:  Without significant joint deformities.  NEUROLOGIC:  Nonfocal.   His chest x-ray shows mild cardiomegaly with congestive heart failure and  mild pulmonary edema.   Electrocardiogram shows sinus rhythm at a rate of 75 with a right bundle  branch block and a left anterior fascicular block.  This is a left axis  deviation.  His PR interval is 166 milliseconds, QRS duration is 144  milliseconds and his QT interval is 467 milliseconds.  He has nonspecific  ST/T wave changes but no Q waves concerning  for old myocardial infarction.  He does have left ventricular hypertrophy and the electrocardiogram is  essentially unchanged from August 2001.   LABORATORIES:  His white blood cell count is 5.2 with an H&H of 12 and 37,  platelet count of 284.  His sodium is 136, potassium 3.4, chloride is 106.  His bicarbonate is 26.  His BUN and creatinine are 17 and 1.1.  Blood sugar  is 108.  His first set of cardiac enzymes is inconsistent with acute  myocardial infarction.  His B-Type natriuretic peptide is 568.   So, essentially we have a gentleman with known cardiomyopathy who now  presents with decompensated congestive heart failure who also has  hypertension which is poorly controlled and hypokalemia also with atypical  chest discomfort.  My recommendation is that we increase his ACE inhibitor,  add some Aldactone, aggressive diureses of him and follow his weights with a  targeted diuresis of 1 liter over the next 12-18 hours with a total diuresis  of between 4-5 liters.  Probably should follow his daily weights, his Is and  Os, daily chem-7, check a magnesium and follow his blood pressure.  Also I  would encourage a consideration for an ischemic work up; although, I doubt  seriously that the chest discomfort is cardiac in etiology.  A Cardiolite  when his heart failure is better tuned up is not a bad option.                                                Vida Roller, M.D.    JH/MEDQ  D:  06/29/2002  T:  06/29/2002  Job:  [id number]

## 2010-06-01 NOTE — Procedures (Signed)
NAMEDUANTE, AROCHO                 ACCOUNT NO.:  [id number]   MEDICAL RECORD NO.:  [id number]          PATIENT TYPE:  OUT   LOCATION:  RESP                          FACILITY:  [facility]   PHYSICIAN:  Edward L. Juanetta Gosling, M.D.DATE OF BIRTH:  Jun 30, 1932   DATE OF PROCEDURE:  DATE OF DISCHARGE:  10/29/2005                              PULMONARY FUNCTION TEST   1. Spirometry shows a mild to moderate ventilatory defect with evidence of      airflow obstruction.  2. Lung volumes show a reduction in total lung capacity suggesting a      separate restrictive change.  3. DLCO was mildly reduced.  4. Arterial blood gases are normal.  5. There is improvement but it does not reach the level of significance      with inhaled bronchodilator.      Edward L. Juanetta Gosling, M.D.  Electronically Signed     ELH/MEDQ  D:  10/29/2005  T:  10/31/2005  Job:  [id number]   cc:   Milus Mallick. Lodema Hong, M.D.  Fax: [phone]

## 2010-06-01 NOTE — Assessment & Plan Note (Signed)
[facility]                         ELECTROPHYSIOLOGY OFFICE NOTE   NAME:[name] Macias                          MRN:          [id number]  DATE:12/12/2005                            DOB:          01/22/1932    [name] returns today for followup.  He is a very pleasant male with a  history of a nonischemic cardiomyopathy and congestive heart failure and  left bundle branch block who is status post ICD insertion.  He has a  severe cough on ACE-inhibitors.  He was initially tired on beta blockers  but apparently did not get his prescription filled back in May.   His medications include:  1. Coumadin.  2. Aricept.  3. Furosemide.  4. Diovan.  5. Nexium.  6. Spironolactone.   The patient denies chest pain.  He denies peripheral edema.  He states  that his heart failure symptoms are clearly still class III.   EXAM:  He is a pleasant man in no acute distress.  Blood pressure was 111/70.  The pulse was 90 and regular.  Respirations  were 18.  Weight was 179 pounds.  NECK:  No jugular venous distention.  LUNGS:  Clear bilaterally to auscultation.  CARDIOVASCULAR:  Regular rate and rhythm with normal S1, S2.  EXTREMITIES:  No cyanosis, clubbing, or edema.  The pulses were 2+ and  symmetric.   Interrogation of his defibrillator today was carried out.  He had  previously had this done about 2 weeks ago.  Today, we discovered that  his LV was about 20 msec before his RV and he was reprogrammed so that  the LV would be pacing at an AV delay of 130 with an A to RV delay of  150.   IMPRESSION:  1. Nonischemic cardiomyopathy.  2. Congestive heart failure, class III.  3. Status post BiV implantable cardioverter defibrillator insertion      with no significant improvement in his function.   DISCUSSION:  Today we have made the programming changes and we will  start him on Coreg.  He failed to continue this before but, hopefully,  will be able to take it now  with generic Coreg being available.  We will  plan to see him back in the office in 1 month in our [facility].     Doylene Canning. Ladona Ridgel, MD  Electronically Signed    GWT/MedQ  DD: 12/12/2005  DT: 12/13/2005  Job #: [id number]

## 2010-06-01 NOTE — Assessment & Plan Note (Signed)
[facility]                         ELECTROPHYSIOLOGY OFFICE NOTE   NAME:COBBAlphus, Zeck                          MRN:          [id number]  DATE:03/05/2006                            DOB:          1932-04-04    Mr. Korzeniewski was seen today in the [facility], March 05, 2006, for  follow up of his Medtronic model number [id number] [facility]; date of implant was  February 19, 2005 for nonischemic cardiomyopathy.   On interrogation of his device today his battery voltage was 3.10 with a  charge time of 7.66 seconds.  P waves measured 4 mV with an atrial  capture threshold of 1 volt at 0.2 msec and an atrial lead impedance of  552 ohms.  R waves measured 12 mV with a right ventricular pacing  threshold of 1 volt at 0.2 msec and a right ventricular lead impedance  of 640 ohms.  Left ventricular pacing threshold was 1 volt at 0.3 msec  with a left ventricular lead impedance of 424 ohms.  Shock impedance was  58.  There three nonsustained episodes since his last interrogation and  on mode switch.  I did lower his left ventricular output to 2.5 volts at  0.4 msec; no other changes were made.   The patient will return again in four month's time.      Altha Harm, LPN  Electronically Signed      Doylene Canning. Ladona Ridgel, MD  Electronically Signed   PO/MedQ  DD: 03/05/2006  DT: 03/05/2006  Job #: [id number]

## 2010-06-01 NOTE — Consult Note (Signed)
[name] Macias, [name] Macias                 ACCOUNT NO.:  [id number]   MEDICAL RECORD NO.:  [id number]          PATIENT TYPE:  INP   LOCATION:  A213                          FACILITY:  [facility]   PHYSICIAN:  Vida Macias, M.D.   DATE OF BIRTH:  March 21, 1932   DATE OF CONSULTATION:  DATE OF DISCHARGE:                                   CONSULTATION   PRIMARY CARE PHYSICIAN:  [name] Macias.   PRIMARY CARDIOLOGIST:  [name] Macias.   HISTORY OF PRESENT ILLNESS:  [name] Macias is a 77 year old gentleman with known  non-ischemic cardiomyopathy who presented to [facility] ER after  awakening with left anterior chest discomfort.  He noted several brief  episodes of chest pain earlier that day with no relation to exertion and  spontaneous relief.  He awoke about 4:00 a.m. to go to the restroom and felt  presyncopal and noted onset of severe chest tightness with associated  shortness of breath and nausea.  His wife brought him to the emergency  department where he received three sublingual nitroglycerins with relief of  his pain.  He is currently treated with nitroglycerin 1.5 inches of paste at  q. 4h. and has had no recurrence of his chest discomfort.  He states that  over the past one to two weeks he has noted worsening dyspnea on exertion  and occasional episodes of chest discomfort.  He also notes some orthopnea,  peripheral edema and a productive cough over the last two weeks.  [facility] Friday evening and noted a severe headache in the right side of  his head behind his right eye that resolved spontaneously.   PAST MEDICAL HISTORY:  1.  Nonischemic cardiomyopathy with catheterization in June of 2004      revealing a proximal 40% stenosis of the RCA with an EF of 15 to 20%.      Most recent evaluation in December of 2004 was an echo with tissue      Doppler revealing an EF of 30%, a significant intraventricular      dyssynchrony.  He was felt to meet criteria for a  biventricular pacer.  2.  Hypertension.  3.  History of non-sustained ventricular tachycardia.  4.  History of GI bleeds.  Colonoscopy revealed extensive diverticulosis and      multiple polyps, status post polypectomies.   PAST SURGICAL HISTORY:  1.  Status post left ankle surgery.  2.  Status post TURP, secondary to BPH in 2001.   SOCIAL HISTORY:  [name] Macias lives in [place] with his wife.  He is retired.  He has three daughters.  He has a history of smoking with cessation  approximately 20 years ago.  He denies any alcohol or illicit drug use.   FAMILY HISTORY:  Mother deceased at 32 years old secondary to heart disease.  Father deceased at 5 years ago secondary to MI.  One brother deceased at 80  years ago secondary to heart disease and one brother alive at 46 years old  with prostate cancer.   REVIEW OF SYSTEMS:  Negative for fevers or chills.  HEENT:  Positive for  headache, as per HPI.  No vertigo or vision changes.  No rashes or lesions.  CARDIOPULMONARY:  Per HPI.  GU:  No frequency or dysuria.  NEURO/PSYCH:  Generalized weakness; no numbness.  MUSCULOSKELETAL:  Right shoulder  arthralgia.  GI:  Nausea, no vomiting and minimal amount of bright red blood  per rectum on tissue paper this morning.  No melena.  No GERD symptoms or  abdominal pain.  All other systems reviewed are negative.   ALLERGIES:  1.  SULFA.  2.  CEFADYL.   MEDICATIONS PRIOR TO ADMISSION:  1.  Clarinex 5 mg p.r.n.  2.  Aricept 5 mg q.d.  3.  Altace 10 mg q.d.  4.  Zoloft 50 mg q.d.  5.  Aldactone 25 mg q.d.  6.  WARCEF study drug.  7.  Aspirin 81 mg q.d.  8.  Lasix 20 mg b.i.d.   MEDICATIONS IN THE HOSPITAL:  1.  Aspirin 81 mg q.d.  2.  Ziac one q.d.  3.  Aricept 5 mg q.d.  4.  Lovenox 80 mg q. 12h.  5.  Lasix 20 mg p.o. b.i.d.  6.  Nitroglycerin paste 1.5 inches q. 4 h.  7.  Altace 10 mg q.d.  8.  Zoloft 50 mg q.d.  9.  Aldactone 25 mg q.d.  10. IV normal saline at 20 cc per hour.  11.  WARCEF study drug.   PHYSICAL EXAMINATION:  VITAL SIGNS:  Temperature is 98.3; pulse 68;  respirations 20; blood pressure 111/60; weight 184; oxygen saturation 96% on  room air.  GENERAL:  Well-developed, well-nourished male in no acute distress.  He is  alert and oriented times four.  HEENT:  Normocephalic, atraumatic.  Pupils equal, round and reactive to  light.  Extraocular movements are intact.  NECK:  No jugulovenous distension.  No carotid bruits.  CARDIOVASCULAR:  Regular rate and rhythm.  2/6 holosystolic murmur is noted.  Pulses are intact in all four extremities.  No femoral bruits are noted.  LUNGS:  Clear to auscultation bilaterally without wheezes, rales or rhonchi.  SKIN:  No obvious rashes.  ABDOMEN:  Soft, nontender with active bowel sounds.  GU/RECTAL:  Deferred.  EXTREMITIES:  No cyanosis, clubbing or edema.  Pulses are 1+ bilaterally in  the lower extremities.  NEUROLOGIC:  Alert and oriented times three.  Cranial nerves are grossly  intact.   Chest x-ray:  Cardiomegaly with emphysema.   Electrocardiogram:  Sinus rhythm at 69 beats per minute, bifascicular block,  no change from previous EKG done April 07, 2003.   LABORATORIES:  White blood cell 6.9, hemoglobin 12.5, hematocrit 36.9,  platelets 291.  Sodium 136, potassium 3.9, chloride 107, CO2 25, BUN 18,  creatinine 1.3, glucose 99, calcium 9.0, magnesium 2.0.  Cardiac enzymes are  negative times three for acute myocardial infarction.  PT/INR 12.8 and 1.0.   IMPRESSION AND PLAN:  1.  Chest discomfort - I am uncertain of cause, but not likely to be      coronary artery disease.  Recent catheterization revealed non-      obstructive coronary artery disease.  2.  Congestive heart failure with non-ischemic cardiomyopathy - EF in the 15      to 30% range.  This is a non-ischemic dilated cardiomyopathy, which is      felt to be secondary to hypertension. 3.  Hypertension - Well controlled.   PLAN:  Further  evaluate  with an adenosine Cardiolite in the morning.  We  will discontinue his nitroglycerin paste and continue his present  medications as they are listed above.   The patient was interviewed and examined by [name] Macias.  He agrees  with the above assessment and plan.  We appreciate this consult and will be  happy to follow this patient along with you.      AB/MEDQ  D:  10/03/2003  T:  10/03/2003  Job:  [id number]

## 2010-06-01 NOTE — Discharge Summary (Signed)
NAMESKYY, MCKNIGHT                             ACCOUNT NO.:  [id number]   MEDICAL RECORD NO.:  [id number]                   PATIENT TYPE:  INP   LOCATION:  A206                                 FACILITY:  [facility]   PHYSICIAN:  Annia Friendly. Loleta Chance, M.D.                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   CHIEF COMPLAINT:  Rectal bleeding.   HISTORY:  Rectal bleeding x3 on the day of admission.  Rectal bleeding  started at home around 0900.  Bleeding was described as bright red on each  occasion.  Estimated blood loss was initially 2 tablespoonful.  A second  episode of blood loss was at 12 noon and blood loss was 1-1/2 cupfuls.  The  third episode of rectal bleeding occurred at 1500 with an estimated blood  loss of 1 cupful.  The patient is complaining of feeling slightly dizzy,  experienced lower abdominal cramping before passing a blood residue rectally  as sweaty, _____________ and passing of blood.  He denies syncope, chest  pain, nausea, vomiting and shortness of breath.   MEDICAL HISTORY:  Positive for diverticulosis.  The patient underwent  colonoscopy in June of 2000 and was found to have multiple colonic polyps  with extensive diverticulosis.  In the ascending colon he had a wide based  polyp that was snared and cauterized but was felt not to be adequately  exercised.  The base was cauterized.  It was felt that this was not  adequately done and there was a plan for outpatient follow up colonoscopy.  The patient had 4 other polyps that were either excised or destroyed.  Medical history was negative for colon cancer, peptic ulcer disease,  inflammatory bowel disease and bleeding disorder.  Medical history was  positive for hypertension, nonischemic cardiomyopathy (ejection  fraction of  15-20%) by cardiac catheterization on July 01, 2002 at [facility]  [place], [place], history of nonsustained ventricular tachycardia  and osteoarthritis.  Habits were positive for former ethanol and tobacco use  x17 years.  The patient was allergic to SULFA drug (itchy rash).   FAMILY HISTORY:  1. Mother deceased, age 80, secondary to complications of heart disease.  2. Father deceased, age 38, secondary to heart attack.  3. One brother deceased, age 42, secondary to heart disease.  4. One brother living, age 56, with a history of prostate cancer.  5. Three daughters living:  Age 21 health unknown; age 77 history of mental     retardation; age 14 with a history of asthma.   PAST MEDICAL HISTORY:  1. Positive for hospitalization of left ankle surgery secondary to fracture     treated with open reduction  internal fixation at [facility] in     [place], [place].  2. Transurethral prostatectomy secondary to benign prostatic hypertrophy     (symptomatic) in November 2001 by Dr. Jerre Simon.  3. Hospitalization for urosepsis by Dr. Patrica Duel in August 2001 at     [facility].  4. Pulmonary edema secondary to cardiomyopathy secondary to hypertensive     disease in June of 2004 at [facility].   HOSPITAL COURSE BY PROBLEMS:  Problem #1:  RECTAL BLEEDING SECONDARY TO  DIVERTICULOSIS.  General appearance revealed an elderly, medium-framed,  medium-height, alert, black male in no respiratory distress.  Skin warm and  dry.  Lungs clear.  Heart audible S1, S2 without murmur.  A regular rate and  rhythm.  Abdomen obese, hyperactive bowel sounds, soft, nontender in all 4  quadrants.  No palpable mass.  No organomegaly. Rectal no external lesions.  Digital exam was done by the emergency room physician and was guaiac  negative.  The vital signs on admission were as follows:  Temperature 97.1,  blood pressure 96/64, pulse 72, and  respirations 20.  Blood pressure reading  on the medical floor was 127/71 lying, pulse was 62; blood pressure sitting  124/76 with a pulse of 67; blood pressure standing 126/88 with a pulse of  71; all in the left arm.   The patient was treated with IV fluids, IV Protonix, oxygen at 2  liters/minute via nasal cannula, scheduled for diagnostic EGD and  colonoscopy by Dr. Mindi Junker. Smith, anemia panel and reticulocyte count, routine  labs, clear liquid diet and other supportive measures.   Significant labs on admission were as follows:  White count 6.6; hemoglobin  13.0, hematocrit 38.2, platelets 319,000.  Sodium 138, potassium 3.7,  chloride 105, CO2 28, glucose 114, BUN 28, creatinine 1.4, calcium 9.0.  CT  of the abdomen with contrast on March 13, 2003 was read as diverticulosis  without evidence for acute diverticulitis and left renal cyst.  CT of the  pelvis with contrast on March 13, 2003 demonstrated no evidence for acute  abnormality of the pelvis as read by Dr. Norva Pavlov.  The patient's  systolic reading did go up.  Repeat blood pressure at 8 p.m. on admission  was 130/76 with fluid.  The patient required no transfusions during this  hospitalization.  Repeat hemoglobin on March 14, 2003 was 11.7 and  hematocrit 34.6.  Repeat hemoglobin on March 15, 2003 was 13, hematocrit  38.4.  Anemia panel demonstrated the following:  Iron 42 mcg/dL (normal 42-  130),  total iron binding capacity 253 mcg/dL (normal 865-784), iron percent  saturation 17% (normal 20-55), B12 of 424 picogram/mL, folate 376 ng/mL  (normal 180-600), ferritin 242 ng/mL.   The patient underwent diagnostic EGD on March 1 by Dr. Maggie Schwalbe without  complications.  The findings were chronic gastritis.  Colonoscopy was  performed by Dr. Maggie Schwalbe without complications on March 15, 2003  demonstrating diffuse diverticulosis and hemorrhoids.  The patient remained stable throughout the procedure.  No sign of rectal  bleeding was seen.  The  patient was discharged home on March 15, 2003.   Problem #2:  NONISCHEMIC CARDIOMYOPATHY.  Fluids were given cautiously.  The  patient tolerated fluids without problems.  He did not experience any  pulmonary edema or complained of shortness of breath.  Lung fields remained  clear throughout this hospitalization.  Heart exam on auscultation was  within normal range.   Problem #  3:  HISTORY OF NONSUSTAINED VENTRICULAR TACHYCARDIA.  The patient  did not complain of palpitations, shortness of breath, dizziness or chest  pain during this hospitalization.  Heart exam always revealed audible S1 and  S2.  It was regular rate and rhythm during this hospitalization.  The  patient also did not experience any syncope episodes during this  hospitalization.   Problem #4:  POSITIVE TB SKIN TEST.  The plan is observe.  The patient will  get a chest x-ray as needed.   Problem #5:  OSTEOARTHRITIS.  Examination of joints demonstrated on redness,  hotness, or swelling.  The patient did not complain of pain of joints. No  medication was given to treat joint pain during this hospitalization.   Problem #6:  SEASONAL RHINORRHEA.  The patient was treated with Astelin  nasal spray 1 spray each nostril daily.  Moreover he was also treated with  Claritin as an outpatient 5 mg p.o. daily.   DISCHARGE MEDICATIONS:  1. Aricept 5 mg p.o. every day for memory.  2. Lasix 20 mg p.o. b.i.d.  3. Clarinex 5 mg p.o. daily for allergies.  4. Altace 10 mg p.o. daily.  5. Zoloft 50 mg p.o. daily.  6. Spironolactone 25 mg p.o. daily.  7. One aspirin 81 mg p.o. daily.  8. Astelin nasal spray 1 spray each nostril q.6h.  9. Coumadin as directed by the Coumadin clinic.   INSTRUCTIONS AT TIME OF DISCHARGE:  1. Diet:  Low salt.  2. The patient will follow up with Dr. Annia Friendly. Hill in 2 weeks.   FINAL PRIMARY DIAGNOSIS:  Recurrent rectal bleeding secondary to  diverticulosis.   SECONDARY  DIAGNOSES:  1. Gastritis.  2. Nonischemic cardiomyopathy.  3. Osteoarthritis.  4. History of nonsustained ventricular tachycardia.  5. Positive TB skin test.     ___________________________________________                                         Annia Friendly. Loleta Chance, M.D.   Levonne Hubert  D:  03/15/2003  T:  03/16/2003  Job:  [id number]

## 2010-06-01 NOTE — Procedures (Signed)
   [name] Macias, [name] Macias                             ACCOUNT NO.:  [id number]   MEDICAL RECORD NO.:  [id number]                   PATIENT TYPE:  OUT   LOCATION:  RAD                                  FACILITY:  [facility]   PHYSICIAN:  Bethany Bing, M.D. [facility]           DATE OF BIRTH:  April 22, 1932   DATE OF PROCEDURE:                              AGE:  77  DATE OF DISCHARGE:                              SEX:  M                                CARDIAC ULTRASOUND   ADDENDUM:   M-MODE MEASUREMENTS:  Aorta 4.2-4.7.  Left atrium 3.7, septum 1.2, posterior  wall 1.1. LV diastole 5.9, LV systole 4.8.                                               [place] Bing, M.D. [facility]    RR/MEDQ  D:  12/31/2001  T:  01/01/2002  Job:  [id number]

## 2010-06-01 NOTE — Procedures (Signed)
NAMEBRECKYN, [name] Macias                 ACCOUNT NO.:  [id number]   MEDICAL RECORD NO.:  [id number]          PATIENT TYPE:  OUT   LOCATION:  RAD                           FACILITY:  [facility]   PHYSICIAN:  Vida Roller, M.D.   DATE OF BIRTH:  Jul 13, 1932   DATE OF PROCEDURE:  03/14/2004  DATE OF DISCHARGE:                                  ECHOCARDIOGRAM   PROCEDURE:  Echocardiogram   PRIMARY CARE PHYSICIAN:  Dr. Syliva Overman.   TAPE NUMBER:  [id number]   TAPE COUNT:  [id number]   This is a 77 year old man with a known dilated cardiomyopathy.  Previous  echocardiogram in June 2004 shows LV ejection fraction of 25-30% with  depressed LV systolic function, mild MR, mild AI.  Today's study is  technically adequate.   M-MODE TRACING:  The aorta is 41 mm.   Left atrium is 41 mm.   The septum is 17 mm.   The posterior wall is 12 mm.   Left ventricular diastolic dimension 48 mm.   Left ventricular systolic dimension 42 mm.   2-D AND DOPPER IMAGING:  The left ventricle is mildly dilated with marked  decrease in left ventricular systolic function.  Ejection fraction 25-30%.  There is inferior and inferior posterior akinesis.  There is inferior  lateral hypokinesis seen.  There is concentric left ventricular hypertrophy  with evidence of fibrotic scarring of the inferior wall.   The right ventricle is normal size and normal systolic function.   There is mild left atrial enlargement.   The aortic valve is sclerotic with no evidence of stenosis and trace  regurgitation.   The mitral valve has mild regurgitation.   The tricuspid valve has mild regurgitation.   The pulmonic valve not well seen.   No pericardial effusion.   The ascending aorta appears to be dilated in the apical and parasternal  views.  The aortic arch and descending aorta were not well seen.   The inferior vena cava was not well seen.      JH/MEDQ  D:  03/14/2004  T:  03/14/2004  Job:  [id number]

## 2010-06-01 NOTE — Cardiovascular Report (Signed)
[name], [name]                             ACCOUNT NO.:  [id number]   MEDICAL RECORD NO.:  [id number]                   PATIENT TYPE:  INP   LOCATION:  3714                                 FACILITY:  [facility]   PHYSICIAN:  Learta Codding, M.D.                 DATE OF BIRTH:  02/28/1932   DATE OF PROCEDURE:  07/01/2002  DATE OF DISCHARGE:                              CARDIAC CATHETERIZATION   REFERRING PHYSICIAN:  Dr. Loleta Chance.   CARDIOLOGIST:  Dr. Meyer Bing.   PROCEDURE PERFORMED:  1. Left heart catheterization with selective coronary angiography.  2. Right heart catheterization.  3. Ventriculography.   DIAGNOSES:  1. Nonischemic cardiomyopathy.  Ejection fraction 15-20%.  2. Normal left ventricular end-diastolic pressure.   INDICATIONS:  [name] Macias is a 77 year old male with a history of congestive  heart failure.  The patient has been referred for diagnostic catheterization  both left and right to assess his coronary anatomy and hemodynamic status.  The patient initially presents with congestive heart failure with  improvement after diuresis.   DESCRIPTION OF PROCEDURE:  After informed consent was obtained, the patient  was brought to the catheterization laboratory. The right groin was sterilely  prepped and draped. A #6 French arterial sheath and an #8 French femoral  sheath were placed in the femoral artery and femoral vein respectively.  A  Swan-Ganz catheters was advanced into the pulmonary artery and appropriate  outside hemodynamics were obtained.  Cardiac output was determined both by  Fick and thermodilution.  Ventriculography was performed with the #6 French  angle pigtail catheter.  Coronary angiography was performed with JL4 and JR4  catheters, respectively. At the termination of the procedure, all catheters  and sheaths were removed, and no complications were encountered.   FINDINGS:  1. Hemodynamics:  Right atrial pressure was 9/6 mmHg.  RV pressure was  28/3     mmHg.  PA pressure 28/15 mmHg.  Pulmonary capillary wedge pressure is 10     mmHg.  Left ventricular pressure was 120/6 mmHg. Aortic pressure 120/71     mmHg.  Cardiac output 4.2 and 3.9 by thermodilution and Fick,     respectively.  Cardiac index is 2.19 and 2.06 by thermodilution and Fick,     respectively.  Venous arterial saturation was 62% and the arterial oxygen     saturation was 95%.  Systematic vascular resistance was 1619.  2. Ventriculography: Ejection fraction 15-20% with global hypokinesis.  No     significant mitral regurgitation.   SELECTIVE CORONARY ANGIOGRAPHY:  1. The left main coronary artery is a large caliber vessels.  No flow-     limiting coronary artery disease  2. The left anterior descending artery is a large caliber vessel without     flow-limiting disease, including diagnosis branches.  3. The circumflex coronary artery again is a large caliber vessel with no  flow-limiting disease.  4. The right coronary artery had a proximal diffuse 40% stenosis, somewhat     improved after injection of intracoronary nitroglycerin.  There were no     other focal lesions.   RECOMMENDATIONS:  Further medical therapy is appropriate for nonischemic  cardiomyopathy.  This would include ACE inhibitor, add digoxin and beta-  blocker therapy.  The patient's volume status is currently euvolemic and  Lasix will be held today.  We anticipate that he can be discharged tomorrow.  Dr. Loleta Chance had been notified and will continue to provide followup care.                                                Learta Codding, M.D.    GED/MEDQ  D:  07/01/2002  T:  07/02/2002  Job:  [id number]  Annia Friendly. Loleta Chance, M.D.  [address]  [place]  [place] [address]  Fax: [phone]   [place] Bing, M.D.   Vida Roller, M.D.  Fax: [phone]   cc:   Annia Friendly. Loleta Chance, M.D.  [address]  [place]  [place] [address]  Fax: [phone]   Bethany Bing, M.D.   Vida Roller, M.D.  Fax: [phone]

## 2010-06-01 NOTE — Assessment & Plan Note (Signed)
[facility]                         ELECTROPHYSIOLOGY OFFICE NOTE   NAME:COBBNicholis, Stepanek                          MRN:          [id number]  DATE:04/24/2006                            DOB:          07/13/1932    Mr. Diehl was seen today in the [facility] on the 10th of April  2008, for followup of his Medtronic, model #7304, Maximo, date of  implant was February 19, 2005, for nonischemic cardiomyopathy.   On interrogation of his device today:  His battery voltage is 3.10 with a charge time of 7.66 seconds.  P waves measured 4.3 microvolts with an atrial capture threshold of 1  volt at 0.3 milliseconds and an atrial lead impedance of 608 ohms.  R waves measured 14.1 microvolts with a right ventricular pacing  threshold of 1 volt at 0.1 milliseconds, and a right ventricular lead  impedance of 656 ohms.  Left ventricular pacing threshold was 1 volt at 0.3 milliseconds with a  left ventricular lead impedance of 432 ohms.  Shock impedance was 60.  There were seven nonsustained episodes since last interrogation.  No changes were made in his parameters.  There was also one mode switch noted.   He is in the [place] study program.   He will be seen back again in four months' time.      Altha Harm, LPN  Electronically Signed      Doylene Canning. Ladona Ridgel, MD  Electronically Signed   PO/MedQ  DD: 04/24/2006  DT: 04/24/2006  Job #: [id number]

## 2010-06-01 NOTE — Letter (Signed)
August 29, 2005     Milus Mallick. Lodema Hong, MD  [address], [address]  [place], [place] [address]   RE:  CAMERYN, CHRISLEY  MRN:  [id number]  /  DOB:  [birth year]   Dear [name]:   Mr. Valverde returns to the office for continued assessment and treatment of a  nonischemic cardiomyopathy.  Since his last visit, at which time a  reasonable medical regimen was reestablished, he has dramatically improved.  Cough is virtually resolved.  His level of energy and ability to exercise  have substantially increased.   CURRENT MEDICATIONS:  1. WARCEF study drug, either aspirin or warfarin.  2. Aldactone 25 mg daily.  3. Potassium chloride 20 mEq daily.  4. Toprol 25 mg daily.  5. Diovan 80 mg b.i.d.  6. Furosemide 40 mg b.i.d.  7. Proventil inhaler.  8. Zegerid 40 mg a.c.  9. Aricept 10 mg daily.   PHYSICAL EXAMINATION:  GENERAL:  A pleasant gentleman in no acute distress.  VITAL SIGNS:  The weight is 177, one pound less than one week ago.  Blood  pressure 105/65, heart rate 76 and regular, respirations 16.  NECK:  No jugular venous distention.  LUNGS:  Clear.  THORAX:  AICD in place below the left clavicle; no tenderness nor swelling.  CARDIAC:  Normal first and second heart sounds.  Fourth heart sound present.  EXTREMITIES:  1/2+ ankle edema.   IMPRESSION:  Mr. Dotzler is dramatically improved.  We will continue to adjust  his medical therapy.  Hopefully, his biventricular pacing will gradually  improve left ventricular function.   A chemistry profile showed a potassium of 3.6.  His supplemental potassium  will be increased to 40 mEq daily with repeat chemistry profile in one  month.  I will see him again in two months.  Vaccinations are up to date.  He is reminded to receive influenza vaccine in your office when available.    Sincerely,      Gerrit Friends. Dietrich Pates, MD, [facility]   RMR/MedQ  DD:  08/29/2005  DT:  08/29/2005  Job #:  [id number]

## 2010-06-01 NOTE — Discharge Summary (Signed)
[name] Macias, [name] Macias                 ACCOUNT NO.:  [id number]   MEDICAL RECORD NO.:  [id number]          PATIENT TYPE:  INP   LOCATION:  6524                         FACILITY:  [facility]   PHYSICIAN:  Doylene Canning. Ladona Ridgel, M.D.  DATE OF BIRTH:  Jan 26, 1932   DATE OF ADMISSION:  02/19/2005  DATE OF DISCHARGE:  02/20/2005                                 DISCHARGE SUMMARY   PRINCIPAL DIAGNOSES:  Discharging day #1 status post implantation of  Medtronic In-Sync Maximo 734 cardioverted defibrillator, with BiV pacing.   SECONDARY DIAGNOSES:  1.  Nonischemic cardiomyopathy; ejection fraction 25%.  2.  Right bundle branch block and left anterior fascicular block.  3.  Class III congestive heart failure, with hospitalization August 2005 for      decompensated congestive heart failure.  4.  Hypertension.  5.  Chronic obstructive pulmonary disease.  6.  Strong family history of coronary artery disease.  7.  Prior history of peptic ulcer disease, with gastroesophageal reflux      disease.   PROCEDURE:  February 19, 2005 -- Implantation of Medtronic In-Sync Maximo  cardioverted defibrillator, with placement of left ventricular lead and  defibrillator threshold study less than or equal to 15 joules.  The patient  had acceptable A/V pacing and sensing (Dr. Lewayne Bunting).   BRIEF HISTORY:  The patient is a 77 year old male who has a history of  nonischemic cardiomyopathy, which has been longstanding.  He has an ejection  fraction currently of about 25%, and in the past has been as low as 15%.  He  has a wide QRS of 160 msec, secondary to right bundle branch block and left  anterior fascicular block.  He has Class III congestive heart failure, and  has been hospitalized in the past for decompensation of this.  It has been  recommended for implantation of a cardioverter defibrillator as long ago as  August 2005; the patient had declined at that time.  Since then, the patient  is on maximum medical therapy and  still occasionally has exacerbations of  congestive heart failure.  He does have mild peripheral edema and has some  dietary indiscretions with sodium.  The patient presents for elective  implantation of BiV cardioverter defibrillator on February 19, 2005.   HOSPITAL COURSE:  The patient presented to [facility] February 19, 2005 electively.  He underwent implantation of device as described above by  Dr. Lewayne Bunting.  The patient has had no complications post-procedurally.  The leads look as if they are in good position by chest x-ray.  His incision  is healing nicely without hematoma, erythema or drainage.  The patient has  had no fevers this admission.  Chest x-ray shows some increase in pulmonary  vascular congestion, which will be treated with IV Lasix and supplemental  potassium prior to discharge.  The patient's device will be interrogated and  the patient discharged based on successful interrogation.   SPECIAL INSTRUCTIONS:  He is asked to keep his incision dry for the next 7  days and to only sponge bathe until Tuesday, February 26, 2005.  He is asked  not to drive until Tuesday, February 26, 2005.  He is asked not to lift  anything heavier than 10 pounds for the next 4 weeks.   DISCHARGE DIET:  Low sodium, low cholesterol diet.   FOLLOWUP:  [facility]; the [facility] on Monday, March 11, 2005  at 9:00 a.m.  He will see Dr. Ladona Ridgel in May; Dr. Lubertha Basque office will call  with that appointment.   DISCHARGE MEDICATIONS:  1.  Warcef, to be taken as before this hospitalization.  This is a study      program.  2.  Aricept 10 mg daily.  3.  Lasix 20 mg daily.  4.  Apticand 16 mg daily.  5.  Spironolactone 25 mg daily.  6.  Chlorsanuramine 12 mg daily.   LABORATORY STUDIES THIS ADMISSION:  On February 6:  Hemoglobin 13.3,  hematocrit 38.6, white cells 5.4, platelets 319.  Serum electrolytes on  February 6: Sodium 138, potassium 3.8, chloride 109, bicarbonate 20,  BUN 26,  creatinine 1.2, glucose 94.  PT 13.9, INR 1.1.      Maple Mirza, P.A.    ______________________________  Doylene Canning. Ladona Ridgel, M.D.    GM/MEDQ  D:  02/20/2005  T:  02/20/2005  Job:  [id number]   cc:   Vida Roller, M.D.  Fax: [phone]   Dirk Dress Katrinka Blazing, M.D.  Fax: [phone]

## 2010-06-04 ENCOUNTER — Encounter: Payer: Self-pay | Admitting: Family Medicine

## 2010-06-04 ENCOUNTER — Ambulatory Visit: Payer: Medicare HMO | Admitting: Family Medicine

## 2010-06-19 ENCOUNTER — Telehealth: Payer: Self-pay | Admitting: Nurse Practitioner

## 2010-06-19 ENCOUNTER — Emergency Department (HOSPITAL_COMMUNITY): Payer: Medicare HMO

## 2010-06-19 ENCOUNTER — Emergency Department (HOSPITAL_COMMUNITY)
Admission: EM | Admit: 2010-06-19 | Discharge: 2010-06-20 | Disposition: A | Discharge status: Home / Self Care | Payer: Medicare HMO | Attending: Emergency Medicine | Admitting: Emergency Medicine

## 2010-06-19 DIAGNOSIS — I10 Essential (primary) hypertension: Secondary | ICD-10-CM | POA: Insufficient documentation

## 2010-06-19 DIAGNOSIS — Z79899 Other long term (current) drug therapy: Secondary | ICD-10-CM | POA: Insufficient documentation

## 2010-06-19 DIAGNOSIS — J4489 Other specified chronic obstructive pulmonary disease: Secondary | ICD-10-CM | POA: Insufficient documentation

## 2010-06-19 DIAGNOSIS — I509 Heart failure, unspecified: Secondary | ICD-10-CM | POA: Insufficient documentation

## 2010-06-19 DIAGNOSIS — I428 Other cardiomyopathies: Secondary | ICD-10-CM | POA: Insufficient documentation

## 2010-06-19 DIAGNOSIS — T82190A Other mechanical complication of cardiac electrode, initial encounter: Secondary | ICD-10-CM | POA: Insufficient documentation

## 2010-06-19 DIAGNOSIS — J449 Chronic obstructive pulmonary disease, unspecified: Secondary | ICD-10-CM | POA: Insufficient documentation

## 2010-06-19 DIAGNOSIS — I2510 Atherosclerotic heart disease of native coronary artery without angina pectoris: Secondary | ICD-10-CM | POA: Insufficient documentation

## 2010-06-19 DIAGNOSIS — Y831 Surgical operation with implant of artificial internal device as the cause of abnormal reaction of the patient, or of later complication, without mention of misadventure at the time of the procedure: Secondary | ICD-10-CM | POA: Insufficient documentation

## 2010-06-19 LAB — COMPREHENSIVE METABOLIC PANEL
AST: 14 U/L (ref 0–37)
BUN: 39 mg/dL — ABNORMAL HIGH (ref 6–23)
CO2: 25 mEq/L (ref 19–32)
Calcium: 9.7 mg/dL (ref 8.4–10.5)
Chloride: 102 mEq/L (ref 96–112)
Creatinine, Ser: 1.92 mg/dL — ABNORMAL HIGH (ref 0.4–1.5)
GFR calc non Af Amer: 34 mL/min — ABNORMAL LOW (ref >60)
Glucose, Bld: 117 mg/dL — ABNORMAL HIGH (ref 70–99)
Total Bilirubin: 0.3 mg/dL (ref 0.3–1.2)

## 2010-06-19 LAB — DIFFERENTIAL
Basophils Absolute: 0 10*3/uL (ref 0.0–0.1)
Basophils Relative: 1 % (ref 0–1)
Eosinophils Relative: 4 % (ref 0–5)
Lymphocytes Relative: 27 % (ref 12–46)

## 2010-06-19 LAB — CBC
Hemoglobin: 12.1 g/dL — ABNORMAL LOW (ref 13.0–17.0)
MCH: 28.7 pg (ref 26.0–34.0)
MCHC: 32.5 g/dL (ref 30.0–36.0)
Platelets: 227 10*3/uL (ref 150–400)
RDW: 14.7 % (ref 11.5–15.5)

## 2010-06-19 LAB — CK TOTAL AND CKMB (NOT AT ARMC)
CK, MB: 3.3 ng/mL (ref 0.3–4.0)
Total CK: 153 U/L (ref 7–232)

## 2010-06-19 NOTE — Telephone Encounter (Signed)
Received call from [name] Macias tonight stating that his icd is alarming.  He describes the alarm as that of an ambulance.  It is not currently alarming though has approx. 5x tonight, lasting for up to .  He has not been shocked and denies any presyncope or syncope.   i advised that he needs to present to the ED tonight to have the device interrogated as there may be a problem with a lead.  He is going to present to [facility] now.    ICD Vendor: Medtronic ICD Model Number: [id number] ICD Serial Number: [id number]  ICD DOI: 12/13/2009 ICD Implanting MD: Lewayne Bunting, MD  Lead 1: Location: RA DOI: 02/19/2005 Model #: [id number] Serial #: [id number] Status: active  Lead 2: Location: RV DOI: 02/19/2005 Model #: 6213 Serial #: [id number] V Status: active  Lead 3: Location: LV DOI: 02/19/2005 Model #: [id number] Serial #: [id number] V Status: active

## 2010-06-26 ENCOUNTER — Other Ambulatory Visit (HOSPITAL_COMMUNITY): Payer: Self-pay | Admitting: Pulmonary Disease

## 2010-06-26 DIAGNOSIS — N281 Cyst of kidney, acquired: Secondary | ICD-10-CM

## 2010-06-28 ENCOUNTER — Ambulatory Visit (HOSPITAL_COMMUNITY)
Admission: RE | Admit: 2010-06-28 | Discharge: 2010-06-28 | Disposition: A | Discharge status: Home / Self Care | Payer: Medicare HMO | Source: Ambulatory Visit | Attending: Pulmonary Disease | Admitting: Pulmonary Disease

## 2010-06-28 DIAGNOSIS — N281 Cyst of kidney, acquired: Secondary | ICD-10-CM

## 2010-06-28 DIAGNOSIS — Q619 Cystic kidney disease, unspecified: Secondary | ICD-10-CM | POA: Insufficient documentation

## 2010-06-28 DIAGNOSIS — R9389 Abnormal findings on diagnostic imaging of other specified body structures: Secondary | ICD-10-CM | POA: Insufficient documentation

## 2010-07-03 ENCOUNTER — Encounter: Payer: Self-pay | Admitting: Internal Medicine

## 2010-07-03 ENCOUNTER — Ambulatory Visit (INDEPENDENT_AMBULATORY_CARE_PROVIDER_SITE_OTHER): Payer: Medicare HMO | Admitting: Internal Medicine

## 2010-07-03 DIAGNOSIS — M19019 Primary osteoarthritis, unspecified shoulder: Secondary | ICD-10-CM

## 2010-07-03 DIAGNOSIS — I428 Other cardiomyopathies: Secondary | ICD-10-CM

## 2010-07-03 DIAGNOSIS — I5022 Chronic systolic (congestive) heart failure: Secondary | ICD-10-CM

## 2010-07-03 DIAGNOSIS — Z95810 Presence of automatic (implantable) cardiac defibrillator: Secondary | ICD-10-CM

## 2010-07-03 NOTE — Patient Instructions (Signed)
Your physician recommends that you schedule a follow-up appointment in: 3 months.  

## 2010-07-04 ENCOUNTER — Encounter: Payer: Self-pay | Admitting: Internal Medicine

## 2010-07-04 NOTE — Assessment & Plan Note (Signed)
This appears to be his biggest problem. I recommended conservative measures. Tylenol would be option for him if his arthritic symptoms worsen.

## 2010-07-04 NOTE — Assessment & Plan Note (Signed)
His symptoms remain class II. He'll continue a low-sodium diet and his current medications.

## 2010-07-04 NOTE — Assessment & Plan Note (Signed)
His device is working normally. Will recheck in several months. 

## 2010-07-04 NOTE — Progress Notes (Signed)
HPI [name] returns today for followup. He is a 77 yo man with a non-ischemic CM, CHF, HTN and s/p ICD. He denies c/p, sob or peripheral edema. No other symptoms except for some mild arthritis. No syncope, or ICD shocks. Allergies  Allergen Reactions  . Enalapril Maleate   . Sulfonamide Derivatives      Current Outpatient Prescriptions  Medication Sig Dispense Refill  . allopurinol (ZYLOPRIM) 100 MG tablet Take 100 mg by mouth daily.        Marland Kitchen aspirin (ASPIRIN LOW DOSE) 81 MG EC tablet Take 81 mg by mouth daily.        . carvedilol (COREG) 25 MG tablet Take 25 mg by mouth 2 (two) times daily with a meal.        . furosemide (LASIX) 40 MG tablet Take 40 mg by mouth 2 (two) times daily.        Marland Kitchen losartan (COZAAR) 25 MG tablet Take 25 mg by mouth daily.        . nitroGLYCERIN (NITROSTAT) 0.4 MG SL tablet Place 0.4 mg under the tongue as directed.        . Potassium 95 MG TBCR Take by mouth.        . spironolactone (ALDACTONE) 25 MG tablet Take 25 mg by mouth daily.           Past Medical History  Diagnosis Date  . Arthritis   . Gout   . Non-ischemic cardiomyopathy   . Erectile dysfunction   . Anxiety disorder   . Hypertension   . Dementia   . Arthritis     left foot   . CAD (coronary artery disease) of artery bypass graft   . COPD (chronic obstructive pulmonary disease)   . History of syphilis   . Type 2 HSV infection of penis     ROS:   All systems reviewed and negative except as noted in the HPI.   Past Surgical History  Procedure Date  . Left ankle     Not Done by Dr. Romeo Apple   . S/p  biventricular pacemaker / aicd implantation 02/19/05  . Defribrillator implant   . Crtd upgrade 12/14/2009     Family History  Problem Relation Age of Onset  . Heart failure Father   . Prostate cancer Brother   . Diabetes Brother   . Hypertension Brother   . Heart failure Brother      History   Social History  . Marital Status: Married    Spouse Name: N/A    Number of  Children: 3  . Years of Education: N/A   Occupational History  . disabled     Social History Main Topics  . Smoking status: Former Games developer  . Smokeless tobacco: Not on file  . Alcohol Use: No  . Drug Use: No  . Sexually Active: Not on file   Other Topics Concern  . Not on file   Social History Narrative  . No narrative on file     BP 86/62  Pulse 82  Wt 172 lb (78.019 kg)  Physical Exam:  Well appearing NAD HEENT: Unremarkable Neck:  No JVD, no thyromegally Lymphatics:  No adenopathy Back:  No CVA tenderness Lungs:  Clear HEART:  Regular rate rhythm, no murmurs, no rubs, no clicks Abd:  Flat, positive bowel sounds, no organomegally, no rebound, no guarding Ext:  2 plus pulses, no edema, no cyanosis, no clubbing Skin:  No rashes no nodules Neuro:  CN  II through XII intact, motor grossly intact  DEVICE  Normal device function.  See PaceArt for details.   Assess/Plan:

## 2010-07-10 ENCOUNTER — Other Ambulatory Visit (HOSPITAL_COMMUNITY): Payer: Self-pay | Admitting: Pulmonary Disease

## 2010-07-10 DIAGNOSIS — R1900 Intra-abdominal and pelvic swelling, mass and lump, unspecified site: Secondary | ICD-10-CM

## 2010-07-10 DIAGNOSIS — N281 Cyst of kidney, acquired: Secondary | ICD-10-CM

## 2010-07-13 ENCOUNTER — Ambulatory Visit (HOSPITAL_COMMUNITY)
Admission: RE | Admit: 2010-07-13 | Discharge: 2010-07-13 | Disposition: A | Discharge status: Home / Self Care | Payer: Medicare HMO | Source: Ambulatory Visit | Attending: Pulmonary Disease | Admitting: Pulmonary Disease

## 2010-07-13 DIAGNOSIS — R1900 Intra-abdominal and pelvic swelling, mass and lump, unspecified site: Secondary | ICD-10-CM | POA: Insufficient documentation

## 2010-07-13 DIAGNOSIS — R109 Unspecified abdominal pain: Secondary | ICD-10-CM | POA: Insufficient documentation

## 2010-07-13 DIAGNOSIS — K4090 Unilateral inguinal hernia, without obstruction or gangrene, not specified as recurrent: Secondary | ICD-10-CM | POA: Insufficient documentation

## 2010-07-13 DIAGNOSIS — K429 Umbilical hernia without obstruction or gangrene: Secondary | ICD-10-CM | POA: Insufficient documentation

## 2010-07-13 DIAGNOSIS — Q619 Cystic kidney disease, unspecified: Secondary | ICD-10-CM | POA: Insufficient documentation

## 2010-07-13 DIAGNOSIS — N281 Cyst of kidney, acquired: Secondary | ICD-10-CM

## 2010-07-13 MED ORDER — IOHEXOL 300 MG/ML  SOLN
80.0000 mL | Freq: Once | INTRAMUSCULAR | Status: AC | PRN
  Administered 2010-07-13: 80 mL via INTRAVENOUS

## 2010-07-26 ENCOUNTER — Telehealth: Payer: Self-pay | Admitting: Family Medicine

## 2010-07-26 ENCOUNTER — Other Ambulatory Visit: Payer: Self-pay

## 2010-07-26 MED ORDER — FUROSEMIDE 40 MG PO TABS: 40.0000 mg | ORAL_TABLET | Freq: Two times a day (BID) | ORAL | Status: DC

## 2010-07-26 MED ORDER — ALLOPURINOL 100 MG PO TABS: 100.0000 mg | ORAL_TABLET | Freq: Every day | ORAL | Status: DC

## 2010-07-26 NOTE — Telephone Encounter (Signed)
Called his meds (indomathacin-not on medlist) into [facility].

## 2010-08-02 ENCOUNTER — Encounter: Payer: Self-pay | Admitting: Family Medicine

## 2010-08-03 ENCOUNTER — Ambulatory Visit (INDEPENDENT_AMBULATORY_CARE_PROVIDER_SITE_OTHER): Payer: Medicare HMO | Admitting: Family Medicine

## 2010-08-03 ENCOUNTER — Encounter: Payer: Self-pay | Admitting: Family Medicine

## 2010-08-03 VITALS — BP 104/74 | HR 79 | Resp 16 | Ht 66.25 in | Wt 175.1 lb

## 2010-08-03 DIAGNOSIS — M109 Gout, unspecified: Secondary | ICD-10-CM

## 2010-08-03 DIAGNOSIS — R7303 Prediabetes: Secondary | ICD-10-CM

## 2010-08-03 DIAGNOSIS — Z125 Encounter for screening for malignant neoplasm of prostate: Secondary | ICD-10-CM

## 2010-08-03 DIAGNOSIS — R7309 Other abnormal glucose: Secondary | ICD-10-CM

## 2010-08-03 DIAGNOSIS — R7301 Impaired fasting glucose: Secondary | ICD-10-CM

## 2010-08-03 DIAGNOSIS — R5383 Other fatigue: Secondary | ICD-10-CM

## 2010-08-03 DIAGNOSIS — M19019 Primary osteoarthritis, unspecified shoulder: Secondary | ICD-10-CM

## 2010-08-03 DIAGNOSIS — I10 Essential (primary) hypertension: Secondary | ICD-10-CM

## 2010-08-03 DIAGNOSIS — R972 Elevated prostate specific antigen [PSA]: Secondary | ICD-10-CM

## 2010-08-03 DIAGNOSIS — Z95810 Presence of automatic (implantable) cardiac defibrillator: Secondary | ICD-10-CM

## 2010-08-03 LAB — BASIC METABOLIC PANEL
CO2: 25 mEq/L (ref 19–32)
Chloride: 101 mEq/L (ref 96–112)
Creat: 2.17 mg/dL — ABNORMAL HIGH (ref 0.50–1.35)
Sodium: 139 mEq/L (ref 135–145)

## 2010-08-03 LAB — HEMOGLOBIN A1C: Mean Plasma Glucose: 137 mg/dL — ABNORMAL HIGH (ref <117)

## 2010-08-03 LAB — PSA: PSA: 6.17 ng/mL — ABNORMAL HIGH (ref <=4.00)

## 2010-08-03 MED ORDER — TRAMADOL HCL 50 MG PO TABS: ORAL_TABLET | ORAL | Status: DC

## 2010-08-03 MED ORDER — METHYLPREDNISOLONE ACETATE 40 MG/ML IJ SUSP
40.0000 mg | Freq: Once | INTRAMUSCULAR | Status: AC
  Administered 2010-08-03: 40 mg via INTRAMUSCULAR

## 2010-08-03 MED ORDER — KETOROLAC TROMETHAMINE 30 MG/ML IJ SOLN
30.0000 mg | Freq: Once | INTRAMUSCULAR | Status: AC
  Administered 2010-08-03: 30 mg via INTRAMUSCULAR

## 2010-08-03 MED ORDER — CARVEDILOL 25 MG PO TABS: 25.0000 mg | ORAL_TABLET | Freq: Two times a day (BID) | ORAL | Status: DC

## 2010-08-03 MED ORDER — SPIRONOLACTONE 25 MG PO TABS: 25.0000 mg | ORAL_TABLET | Freq: Every day | ORAL | Status: DC

## 2010-08-03 NOTE — Assessment & Plan Note (Signed)
Deteriorated, will give toradol 30mg  and depomedrol 40mg  iM , pt to call for ortho referral if needed, he is also to start tramadol as needed at night only

## 2010-08-03 NOTE — Patient Instructions (Addendum)
F/u in 3.5 months.  You will get injections in the office for left shoulder pain, and medication has also been sent in for use at bedtime if needed.  Pls call for orthopedic referral to Dr Romeo Apple if no better.  Chem 7 , and HBA1C today and PSA

## 2010-08-04 DIAGNOSIS — R972 Elevated prostate specific antigen [PSA]: Secondary | ICD-10-CM | POA: Insufficient documentation

## 2010-08-04 DIAGNOSIS — I10 Essential (primary) hypertension: Secondary | ICD-10-CM | POA: Insufficient documentation

## 2010-08-04 DIAGNOSIS — R7303 Prediabetes: Secondary | ICD-10-CM | POA: Insufficient documentation

## 2010-08-04 NOTE — Assessment & Plan Note (Signed)
Recently had possible malfunction of equipment, has been seen by cardiology since , and is being monitored

## 2010-08-04 NOTE — Assessment & Plan Note (Signed)
New data based on lab obtained on the day of the visit, will refer to urology for further eval, pt aware

## 2010-08-04 NOTE — Progress Notes (Signed)
  Subjective:    Patient ID: [name] Macias, male    DOB: [birth year], 77 y.o.   MRN: [id number]  HPI The PT is here for follow up and re-evaluation of chronic medical conditions, medication management and review o any  recent lab and radiology data.  Preventive health is updated, specifically  Cancer screening, Osteoporosis screening and Immunization.   Reports recent defibrillator going off on 3 separate occasions, card is fo;;owing this. C/o incrased left shoulder pain and stiffness, want injections in the office , will call for ortho eval if needed        Review of Systems Denies recent fever or chills. Denies sinus pressure, nasal congestion, ear pain or sore throat. Denies chest congestion, productive cough or wheezing. Denies chest pains, palpitations, paroxysmal nocturnal dyspnea, orthopnea and leg swelling Denies abdominal pain, nausea, vomiting,diarrhea or constipation.  Denies rectal bleeding or change in bowel movement. Denies dysuria, frequency, hesitancy or incontinence. Denies headaches, seizure, numbness, or tingling. Denies depression, anxiety or uncontrolled  insomnia. Denies skin break down or rash.        Objective:   Physical Exam Patient alert and oriented and in no Cardiopulmonary distress.  HEENT: No facial asymmetry, EOMI, no sinus tenderness, TM's clear, Oropharynx pink and moist.  Neck supple no adenopathy.  Chest: Clear to auscultation bilaterally.  CVS: S1, S2 no murmurs, no S3.  ABD: Soft non tender. Bowel sounds normal.  Ext: No edema  MS: Adequate ROM spine, hips and knees.Decreased ROM left shoulder  Skin: Intact, no ulcerations or rash noted.  Psych: Good eye contact, normal affect. Memory mildly impaired not anxious or depressed appearing.  CNS: CN 2-12 intact,hearing loss, power, tone and sensation normal throughout.        Assessment & Plan:

## 2010-08-04 NOTE — Assessment & Plan Note (Signed)
Controlled, no change in medication  

## 2010-08-04 NOTE — Assessment & Plan Note (Signed)
No recent flare, need to check uric acid level , on allopurinol chronically, and creatinine is elevated

## 2010-08-04 NOTE — Assessment & Plan Note (Signed)
Deterioration , with most recent hBA1C 6.4 , pt to attend diabetic class, and change his diet rept in 3 months, before next visit

## 2010-08-06 NOTE — Progress Notes (Signed)
Addended by: Adella Hare B on: 08/06/2010 10:11 AM   Modules accepted: Orders

## 2010-10-04 ENCOUNTER — Encounter: Payer: Self-pay | Admitting: Internal Medicine

## 2010-10-05 ENCOUNTER — Encounter: Payer: Medicare HMO | Admitting: Internal Medicine

## 2010-10-15 LAB — DIFFERENTIAL
Eosinophils Absolute: 0.3
Lymphocytes Relative: 21
Lymphs Abs: 1.5
Neutro Abs: 4.9
Neutrophils Relative %: 68

## 2010-10-15 LAB — CBC
Platelets: 260
RBC: 4.78
WBC: 7.2

## 2010-10-15 LAB — URINALYSIS, ROUTINE W REFLEX MICROSCOPIC
Ketones, ur: NEGATIVE
Nitrite: NEGATIVE
Protein, ur: NEGATIVE

## 2010-10-15 LAB — APTT: aPTT: 28

## 2010-10-15 LAB — BASIC METABOLIC PANEL
BUN: 27 — ABNORMAL HIGH
Calcium: 9.1
Creatinine, Ser: 1.85 — ABNORMAL HIGH
GFR calc Af Amer: 44 — ABNORMAL LOW
GFR calc non Af Amer: 36 — ABNORMAL LOW

## 2010-10-15 LAB — PROTIME-INR
INR: 1
Prothrombin Time: 13.9

## 2010-10-15 LAB — B-NATRIURETIC PEPTIDE (CONVERTED LAB): Pro B Natriuretic peptide (BNP): 235 — ABNORMAL HIGH

## 2010-10-18 LAB — DIFFERENTIAL
Lymphocytes Relative: 12 % (ref 12–46)
Lymphs Abs: 1.2 10*3/uL (ref 0.7–4.0)
Monocytes Relative: 7 % (ref 3–12)
Neutro Abs: 8.1 10*3/uL — ABNORMAL HIGH (ref 1.7–7.7)
Neutrophils Relative %: 80 % — ABNORMAL HIGH (ref 43–77)

## 2010-10-18 LAB — URINALYSIS, ROUTINE W REFLEX MICROSCOPIC
Glucose, UA: NEGATIVE mg/dL
Ketones, ur: NEGATIVE mg/dL
Protein, ur: NEGATIVE mg/dL
Urobilinogen, UA: 0.2 mg/dL (ref 0.0–1.0)

## 2010-10-18 LAB — CBC
HCT: 37.5 % — ABNORMAL LOW (ref 39.0–52.0)
Hemoglobin: 12.9 g/dL — ABNORMAL LOW (ref 13.0–17.0)
MCHC: 34.3 g/dL (ref 30.0–36.0)
MCV: 87.9 fL (ref 78.0–100.0)

## 2010-10-18 LAB — BASIC METABOLIC PANEL
BUN: 43 mg/dL — ABNORMAL HIGH (ref 6–23)
Chloride: 103 mEq/L (ref 96–112)
Creatinine, Ser: 2.63 mg/dL — ABNORMAL HIGH (ref 0.4–1.5)

## 2010-10-18 LAB — PROTIME-INR: INR: 1.1 (ref 0.00–1.49)

## 2010-11-06 ENCOUNTER — Other Ambulatory Visit: Payer: Self-pay

## 2010-11-06 ENCOUNTER — Telehealth: Payer: Self-pay | Admitting: Family Medicine

## 2010-11-06 DIAGNOSIS — M19019 Primary osteoarthritis, unspecified shoulder: Secondary | ICD-10-CM

## 2010-11-06 MED ORDER — SPIRONOLACTONE 25 MG PO TABS: 25.0000 mg | ORAL_TABLET | Freq: Every day | ORAL | Status: DC

## 2010-11-06 MED ORDER — TRAMADOL HCL 50 MG PO TABS: ORAL_TABLET | ORAL | Status: DC

## 2010-11-06 MED ORDER — ALLOPURINOL 100 MG PO TABS: 100.0000 mg | ORAL_TABLET | Freq: Every day | ORAL | Status: DC

## 2010-11-06 MED ORDER — FUROSEMIDE 40 MG PO TABS: 40.0000 mg | ORAL_TABLET | Freq: Two times a day (BID) | ORAL | Status: DC

## 2010-11-06 MED ORDER — CARVEDILOL 25 MG PO TABS: 25.0000 mg | ORAL_TABLET | Freq: Two times a day (BID) | ORAL | Status: DC

## 2010-11-06 MED ORDER — LOSARTAN POTASSIUM 25 MG PO TABS: 25.0000 mg | ORAL_TABLET | Freq: Every day | ORAL | Status: DC

## 2010-11-12 NOTE — Telephone Encounter (Signed)
Already sent in when wife was here for visit

## 2010-11-22 ENCOUNTER — Ambulatory Visit: Payer: Medicare HMO | Admitting: Cardiology

## 2010-11-27 LAB — BASIC METABOLIC PANEL
BUN: 44 mg/dL — ABNORMAL HIGH (ref 6–23)
Calcium: 10.1 mg/dL (ref 8.4–10.5)
Glucose, Bld: 99 mg/dL (ref 70–99)
Potassium: 4 mEq/L (ref 3.5–5.3)
Sodium: 139 mEq/L (ref 135–145)

## 2010-11-27 LAB — HEMOGLOBIN A1C: Hgb A1c MFr Bld: 5.8 % — ABNORMAL HIGH (ref <5.7)

## 2010-11-29 ENCOUNTER — Ambulatory Visit: Payer: Medicare HMO | Admitting: Family Medicine

## 2010-12-20 ENCOUNTER — Ambulatory Visit (INDEPENDENT_AMBULATORY_CARE_PROVIDER_SITE_OTHER): Payer: Medicare HMO | Admitting: Internal Medicine

## 2010-12-20 ENCOUNTER — Other Ambulatory Visit: Payer: Self-pay

## 2010-12-20 ENCOUNTER — Encounter: Payer: Self-pay | Admitting: Internal Medicine

## 2010-12-20 DIAGNOSIS — I5022 Chronic systolic (congestive) heart failure: Secondary | ICD-10-CM

## 2010-12-20 DIAGNOSIS — Z95810 Presence of automatic (implantable) cardiac defibrillator: Secondary | ICD-10-CM

## 2010-12-20 DIAGNOSIS — I428 Other cardiomyopathies: Secondary | ICD-10-CM

## 2010-12-20 DIAGNOSIS — T82110A Breakdown (mechanical) of cardiac electrode, initial encounter: Secondary | ICD-10-CM

## 2010-12-20 LAB — ICD DEVICE OBSERVATION
AL AMPLITUDE: 4.8 mv
CHARGE TIME: 8.858 s
FVT: 0
LV LEAD THRESHOLD: 0.75 V
RV LEAD AMPLITUDE: 5.9 mv
RV LEAD IMPEDENCE ICD: 551 Ohm
TOT-0001: 1
TZAT-0001ATACH: 3
TZAT-0001SLOWVT: 1
TZAT-0002ATACH: NEGATIVE
TZAT-0002ATACH: NEGATIVE
TZAT-0012ATACH: 150 ms
TZAT-0012FASTVT: 200 ms
TZAT-0018ATACH: NEGATIVE
TZAT-0018SLOWVT: NEGATIVE
TZAT-0019ATACH: 6 V
TZAT-0019ATACH: 6 V
TZAT-0019SLOWVT: 8 V
TZAT-0019SLOWVT: 8 V
TZAT-0020ATACH: 1.5 ms
TZAT-0020ATACH: 1.5 ms
TZAT-0020ATACH: 1.5 ms
TZAT-0020FASTVT: 1.5 ms
TZAT-0020SLOWVT: 1.5 ms
TZAT-0020SLOWVT: 1.5 ms
TZON-0003VSLOWVT: 450 ms
TZST-0001ATACH: 5
TZST-0001ATACH: 6
TZST-0001FASTVT: 4
TZST-0001FASTVT: 5
TZST-0001SLOWVT: 5
TZST-0002ATACH: NEGATIVE
TZST-0002ATACH: NEGATIVE
TZST-0002FASTVT: NEGATIVE
TZST-0002FASTVT: NEGATIVE
TZST-0002FASTVT: NEGATIVE
TZST-0003SLOWVT: 20 J
TZST-0003SLOWVT: 35 J
TZST-0003SLOWVT: 35 J
VF: 1

## 2010-12-20 NOTE — Patient Instructions (Signed)
**Note De-identified  Obfuscation** Your physician recommends that you schedule a follow-up appointment in: 1 year  

## 2010-12-21 ENCOUNTER — Ambulatory Visit (HOSPITAL_COMMUNITY)
Admission: RE | Admit: 2010-12-21 | Discharge: 2010-12-21 | Disposition: A | Discharge status: Home / Self Care | Payer: Medicare HMO | Source: Ambulatory Visit | Attending: Internal Medicine | Admitting: Internal Medicine

## 2010-12-21 DIAGNOSIS — T82190A Other mechanical complication of cardiac electrode, initial encounter: Secondary | ICD-10-CM | POA: Insufficient documentation

## 2010-12-21 DIAGNOSIS — Y849 Medical procedure, unspecified as the cause of abnormal reaction of the patient, or of later complication, without mention of misadventure at the time of the procedure: Secondary | ICD-10-CM | POA: Insufficient documentation

## 2010-12-21 DIAGNOSIS — T82110A Breakdown (mechanical) of cardiac electrode, initial encounter: Secondary | ICD-10-CM

## 2010-12-23 ENCOUNTER — Encounter: Payer: Self-pay | Admitting: Internal Medicine

## 2010-12-23 NOTE — Assessment & Plan Note (Signed)
His device is working normally. Will recheck in several months. 

## 2010-12-23 NOTE — Progress Notes (Signed)
HPI Mr. Castilla returns today for followup. He is a pleasant 77 yo man with chronic systolic CHF, an ICM, s/p ICD implant. He also has HTN. He denies c/p, sob, or syncope. No ICD shocks. Allergies  Allergen Reactions  . Enalapril Maleate   . Sulfonamide Derivatives      Current Outpatient Prescriptions  Medication Sig Dispense Refill  . allopurinol (ZYLOPRIM) 100 MG tablet Take 1 tablet (100 mg total) by mouth daily.  90 tablet  1  . aspirin (ASPIRIN LOW DOSE) 81 MG EC tablet Take 81 mg by mouth daily.        . carvedilol (COREG) 25 MG tablet Take 1 tablet (25 mg total) by mouth 2 (two) times daily with a meal.  180 tablet  1  . furosemide (LASIX) 40 MG tablet Take 1 tablet (40 mg total) by mouth 2 (two) times daily.  180 tablet  1  . losartan (COZAAR) 25 MG tablet Take 1 tablet (25 mg total) by mouth daily.  90 tablet  1  . nitroGLYCERIN (NITROSTAT) 0.4 MG SL tablet Place 0.4 mg under the tongue as directed.        . Potassium 95 MG TBCR Take by mouth.        . spironolactone (ALDACTONE) 25 MG tablet Take 1 tablet (25 mg total) by mouth daily.  90 tablet  1  . traMADol (ULTRAM) 50 MG tablet One tablet at bedtime , as needed for shoulder pain  90 tablet  1     Past Medical History  Diagnosis Date  . Arthritis   . Gout   . Non-ischemic cardiomyopathy   . Erectile dysfunction   . Anxiety disorder   . Hypertension   . Dementia   . Arthritis     left foot   . CAD (coronary artery disease) of artery bypass graft   . COPD (chronic obstructive pulmonary disease)   . History of syphilis   . Type 2 HSV infection of penis     ROS:   All systems reviewed and negative except as noted in the HPI.   Past Surgical History  Procedure Date  . Left ankle     Not Done by Dr. Romeo Apple   . S/p  biventricular pacemaker / aicd implantation 02/19/05  . Defribrillator implant   . Crtd upgrade 12/14/2009     Family History  Problem Relation Age of Onset  . Heart failure Father   . Prostate  cancer Brother   . Diabetes Brother   . Hypertension Brother   . Heart failure Brother      History   Social History  . Marital Status: Married    Spouse Name: N/A    Number of Children: 3  . Years of Education: N/A   Occupational History  . disabled     Social History Main Topics  . Smoking status: Former Smoker -- 1.0 packs/day for 10 years    Types: Cigarettes    Quit date: 01/15/1963  . Smokeless tobacco: Never Used  . Alcohol Use: No  . Drug Use: No  . Sexually Active: Not on file   Other Topics Concern  . Not on file   Social History Narrative  . No narrative on file     BP 116/69  Pulse 75  Ht 5\' 7"  (1.702 m)  Wt 76.204 kg (168 lb)  BMI 26.31 kg/m2  Physical Exam:  Well appearing NAD HEENT: Unremarkable Neck:  No JVD, no thyromegally Lungs:  Clear  with no wheezes. HEART:  Regular rate rhythm, no murmurs, no rubs, no clicks Abd:  soft, positive bowel sounds, no organomegally, no rebound, no guarding Ext:  2 plus pulses, no edema, no cyanosis, no clubbing Skin:  No rashes no nodules Neuro:  CN II through XII intact, motor grossly intact  DEVICE  Normal device function.  See PaceArt for details.   Assess/Plan:

## 2010-12-23 NOTE — Assessment & Plan Note (Signed)
His symptoms are well controlled. He will continue his current meds and maintain a low sodium diet. 

## 2010-12-26 ENCOUNTER — Ambulatory Visit: Payer: Medicare HMO | Admitting: *Deleted

## 2010-12-26 DIAGNOSIS — I428 Other cardiomyopathies: Secondary | ICD-10-CM

## 2010-12-28 NOTE — Progress Notes (Signed)
Pt seen in clinic for follow up of ICD.  Brought to office to evaluate leads with surface electrograms.  Appears that LV lead (which is in RV port), is sensing the left atrium.  Appt made to see Dr Ladona Ridgel in 1 week to evaluate need for intervention.  No programming changes made today.    Gypsy Balsam, RN, BSN 12/28/2010 2:53 PM

## 2011-01-01 ENCOUNTER — Encounter: Payer: Self-pay | Admitting: Internal Medicine

## 2011-01-01 ENCOUNTER — Ambulatory Visit (INDEPENDENT_AMBULATORY_CARE_PROVIDER_SITE_OTHER): Payer: Medicare HMO | Admitting: Internal Medicine

## 2011-01-01 DIAGNOSIS — I5022 Chronic systolic (congestive) heart failure: Secondary | ICD-10-CM

## 2011-01-01 DIAGNOSIS — Z95810 Presence of automatic (implantable) cardiac defibrillator: Secondary | ICD-10-CM

## 2011-01-02 ENCOUNTER — Encounter: Payer: Self-pay | Admitting: Internal Medicine

## 2011-01-02 ENCOUNTER — Ambulatory Visit (INDEPENDENT_AMBULATORY_CARE_PROVIDER_SITE_OTHER): Payer: Medicare HMO | Admitting: *Deleted

## 2011-01-02 DIAGNOSIS — R0989 Other specified symptoms and signs involving the circulatory and respiratory systems: Secondary | ICD-10-CM

## 2011-01-02 DIAGNOSIS — I428 Other cardiomyopathies: Secondary | ICD-10-CM

## 2011-01-02 LAB — ICD DEVICE OBSERVATION

## 2011-01-02 NOTE — Progress Notes (Signed)
icd check in clinic  

## 2011-01-03 ENCOUNTER — Encounter: Payer: Medicare HMO | Admitting: *Deleted

## 2011-01-03 ENCOUNTER — Encounter: Payer: Self-pay | Admitting: Internal Medicine

## 2011-01-03 NOTE — Assessment & Plan Note (Signed)
His symptoms remain class 2-3. He will continue his current meds and maintain a low sodium diet.

## 2011-01-03 NOTE — Assessment & Plan Note (Signed)
His device is not working correctly. He has had withdrawal of his LV lead and is sensing both atrium (intermittantly) and ventricle. He is not capturing in the left ventricle. I have discussed the treatment options with the patient and his family. He will need to have his LV and RV lead removed. Will plan to place a new system at the time of his lead removal.

## 2011-01-03 NOTE — Progress Notes (Signed)
HPI Mr. Wiegel returns today for followup. He is a pleasant 77 yo man with a h/o DCM, Chronic systolic heart failure, s/p BiV ICD. He recently received an ICD shock. It was my impression that he received an ICD shock due to bidirectional VT. However on review of his ICD system, we noted that when he underwent ICD generator change, his LV lead was placed in the RV port. In addition, the RV lead was in the LV port and appears not to be working, at least intermittantly. He is not PPM dependent. He denies chest pain. His CHF symptoms in the past several months are class 3. Interogation of his ICD demonstrates that he is not capturing in the left ventricle, and at time in the right ventricle. He has a 6949 lead in the RV. Allergies  Allergen Reactions  . Enalapril Maleate   . Sulfonamide Derivatives      Current Outpatient Prescriptions  Medication Sig Dispense Refill  . allopurinol (ZYLOPRIM) 100 MG tablet Take 1 tablet (100 mg total) by mouth daily.  90 tablet  1  . aspirin (ASPIRIN LOW DOSE) 81 MG EC tablet Take 81 mg by mouth daily.        . carvedilol (COREG) 25 MG tablet Take 1 tablet (25 mg total) by mouth 2 (two) times daily with a meal.  180 tablet  1  . furosemide (LASIX) 40 MG tablet Take 1 tablet (40 mg total) by mouth 2 (two) times daily.  180 tablet  1  . losartan (COZAAR) 25 MG tablet Take 1 tablet (25 mg total) by mouth daily.  90 tablet  1  . nitroGLYCERIN (NITROSTAT) 0.4 MG SL tablet Place 0.4 mg under the tongue as directed.        . NON FORMULARY 2 mLs. Oxygen. Daily at night and PRN during the day.       . Potassium 95 MG TBCR Take by mouth.        . spironolactone (ALDACTONE) 25 MG tablet Take 1 tablet (25 mg total) by mouth daily.  90 tablet  1  . traMADol (ULTRAM) 50 MG tablet One tablet at bedtime , as needed for shoulder pain  90 tablet  1     Past Medical History  Diagnosis Date  . Arthritis   . Gout   . Non-ischemic cardiomyopathy   . Erectile dysfunction   .  Anxiety disorder   . Hypertension   . Dementia   . Arthritis     left foot   . CAD (coronary artery disease) of artery bypass graft   . COPD (chronic obstructive pulmonary disease)   . History of syphilis   . Type 2 HSV infection of penis     ROS:   All systems reviewed and negative except as noted in the HPI.   Past Surgical History  Procedure Date  . Left ankle     Not Done by Dr. Romeo Apple   . S/p  biventricular pacemaker / aicd implantation 02/19/05  . Defribrillator implant   . Crtd upgrade 12/14/2009  . Transthoracic echocardiogram 2011  . Doppler echocardiography 2009     Family History  Problem Relation Age of Onset  . Heart failure Father   . Prostate cancer Brother   . Diabetes Brother   . Hypertension Brother   . Heart failure Brother      History   Social History  . Marital Status: Married    Spouse Name: N/A    Number of Children:  3  . Years of Education: N/A   Occupational History  . disabled     Social History Main Topics  . Smoking status: Former Smoker -- 1.0 packs/day for 10 years    Types: Cigarettes    Quit date: 01/15/1963  . Smokeless tobacco: Never Used  . Alcohol Use: No  . Drug Use: No  . Sexually Active: Not on file   Other Topics Concern  . Not on file   Social History Narrative  . No narrative on file     BP 112/60  Pulse 58  Ht 5\' 8"  (1.727 m)  Wt 76.204 kg (168 lb)  BMI 25.54 kg/m2  SpO2 99%  Physical Exam:  Well appearing elderly man, NAD HEENT: Unremarkable Neck:  No JVD, no thyromegally Lymphatics:  No adenopathy Back:  No CVA tenderness Lungs:  Clear with no wheezes. HEART:  Regular rate rhythm, no murmurs, no rubs, no clicks Abd:  soft, positive bowel sounds, no organomegally, no rebound, no guarding Ext:  2 plus pulses, no edema, no cyanosis, no clubbing Skin:  No rashes no nodules Neuro:  CN II through XII intact, motor grossly intact  EKG NSR with RBBB.  DEVICE  Abnormal device function.  See  PaceArt for details.   Assess/Plan:

## 2011-01-04 ENCOUNTER — Encounter: Payer: Self-pay | Admitting: Internal Medicine

## 2011-01-07 ENCOUNTER — Encounter: Payer: Self-pay | Admitting: Internal Medicine

## 2011-01-10 ENCOUNTER — Encounter: Payer: Self-pay | Admitting: *Deleted

## 2011-01-11 ENCOUNTER — Other Ambulatory Visit: Payer: Self-pay | Admitting: *Deleted

## 2011-01-11 DIAGNOSIS — T82110A Breakdown (mechanical) of cardiac electrode, initial encounter: Secondary | ICD-10-CM

## 2011-01-17 ENCOUNTER — Other Ambulatory Visit: Payer: Self-pay | Admitting: Internal Medicine

## 2011-01-17 LAB — APTT: aPTT: 31 seconds (ref 24–37)

## 2011-01-18 ENCOUNTER — Telehealth: Payer: Self-pay | Admitting: Family Medicine

## 2011-01-18 LAB — CBC
Hemoglobin: 13.9 g/dL (ref 13.0–17.0)
MCH: 30.3 pg (ref 26.0–34.0)
MCV: 92.2 fL (ref 78.0–100.0)
Platelets: 278 10*3/uL (ref 150–400)
RBC: 4.59 MIL/uL (ref 4.22–5.81)
WBC: 7.4 10*3/uL (ref 4.0–10.5)

## 2011-01-18 LAB — BASIC METABOLIC PANEL
CO2: 22 mEq/L (ref 19–32)
Calcium: 9.8 mg/dL (ref 8.4–10.5)
Glucose, Bld: 97 mg/dL (ref 70–99)
Potassium: 4.3 mEq/L (ref 3.5–5.3)
Sodium: 141 mEq/L (ref 135–145)

## 2011-01-18 MED ORDER — BENZONATATE 100 MG PO CAPS: 100.0000 mg | ORAL_CAPSULE | Freq: Three times a day (TID) | ORAL | Status: DC | PRN

## 2011-01-18 NOTE — Telephone Encounter (Signed)
Send in tessalon perles 100mg  one 3 times daily #21.Pls tRANSFER TO FRONT FOR WORK IN NEXT WEEK,  hE needs to go to ED if unable to wait on appt next week, if he starts having fever, chills, and feels worse

## 2011-01-18 NOTE — Telephone Encounter (Signed)
Called med into [facility]. Called pt and left message to call office

## 2011-01-22 ENCOUNTER — Encounter (HOSPITAL_COMMUNITY): Payer: Self-pay | Admitting: Pharmacy Technician

## 2011-01-22 NOTE — Telephone Encounter (Signed)
Called pt again on Friday and left message.

## 2011-01-23 ENCOUNTER — Encounter (HOSPITAL_COMMUNITY): Payer: Self-pay | Admitting: *Deleted

## 2011-01-23 MED ORDER — SODIUM CHLORIDE 0.9 % IV SOLN: INTRAVENOUS | Status: DC

## 2011-01-23 MED ORDER — SODIUM CHLORIDE 0.45 % IV SOLN: INTRAVENOUS | Status: DC

## 2011-01-23 MED ORDER — CHLORHEXIDINE GLUCONATE 4 % EX LIQD: 60.0000 mL | Freq: Once | CUTANEOUS | Status: DC

## 2011-01-23 MED ORDER — CEFAZOLIN SODIUM 1-5 GM-% IV SOLN
1.0000 g | INTRAVENOUS | Status: AC
  Administered 2011-01-24: 1 g via INTRAVENOUS
  Filled 2011-01-23: qty 50

## 2011-01-23 MED ORDER — SODIUM CHLORIDE 0.9 % IR SOLN
80.0000 mg | Status: DC
  Filled 2011-01-23: qty 2

## 2011-01-24 ENCOUNTER — Encounter (HOSPITAL_COMMUNITY): Payer: Self-pay | Admitting: Certified Registered"

## 2011-01-24 ENCOUNTER — Encounter (HOSPITAL_COMMUNITY): Payer: Self-pay | Admitting: *Deleted

## 2011-01-24 ENCOUNTER — Encounter (HOSPITAL_COMMUNITY)
Admission: RE | Disposition: A | Discharge status: Home / Self Care | Payer: Self-pay | Source: Ambulatory Visit | Attending: Internal Medicine

## 2011-01-24 ENCOUNTER — Ambulatory Visit (HOSPITAL_COMMUNITY): Payer: Medicare Other

## 2011-01-24 ENCOUNTER — Ambulatory Visit (HOSPITAL_COMMUNITY)
Admission: RE | Admit: 2011-01-24 | Discharge: 2011-01-25 | Disposition: A | Discharge status: Home / Self Care | Payer: Medicare Other | Source: Ambulatory Visit | Attending: Internal Medicine | Admitting: Internal Medicine

## 2011-01-24 ENCOUNTER — Ambulatory Visit (HOSPITAL_COMMUNITY): Payer: Medicare Other | Admitting: Certified Registered"

## 2011-01-24 DIAGNOSIS — I25810 Atherosclerosis of coronary artery bypass graft(s) without angina pectoris: Secondary | ICD-10-CM | POA: Insufficient documentation

## 2011-01-24 DIAGNOSIS — K08109 Complete loss of teeth, unspecified cause, unspecified class: Secondary | ICD-10-CM | POA: Insufficient documentation

## 2011-01-24 DIAGNOSIS — I209 Angina pectoris, unspecified: Secondary | ICD-10-CM | POA: Insufficient documentation

## 2011-01-24 DIAGNOSIS — F411 Generalized anxiety disorder: Secondary | ICD-10-CM | POA: Insufficient documentation

## 2011-01-24 DIAGNOSIS — I4510 Unspecified right bundle-branch block: Secondary | ICD-10-CM | POA: Insufficient documentation

## 2011-01-24 DIAGNOSIS — K219 Gastro-esophageal reflux disease without esophagitis: Secondary | ICD-10-CM | POA: Insufficient documentation

## 2011-01-24 DIAGNOSIS — I252 Old myocardial infarction: Secondary | ICD-10-CM | POA: Insufficient documentation

## 2011-01-24 DIAGNOSIS — Z950 Presence of cardiac pacemaker: Secondary | ICD-10-CM | POA: Insufficient documentation

## 2011-01-24 DIAGNOSIS — J449 Chronic obstructive pulmonary disease, unspecified: Secondary | ICD-10-CM | POA: Insufficient documentation

## 2011-01-24 DIAGNOSIS — Z95810 Presence of automatic (implantable) cardiac defibrillator: Secondary | ICD-10-CM

## 2011-01-24 DIAGNOSIS — I509 Heart failure, unspecified: Secondary | ICD-10-CM | POA: Insufficient documentation

## 2011-01-24 DIAGNOSIS — T82198A Other mechanical complication of other cardiac electronic device, initial encounter: Secondary | ICD-10-CM | POA: Insufficient documentation

## 2011-01-24 DIAGNOSIS — I5022 Chronic systolic (congestive) heart failure: Secondary | ICD-10-CM | POA: Insufficient documentation

## 2011-01-24 DIAGNOSIS — T82110A Breakdown (mechanical) of cardiac electrode, initial encounter: Secondary | ICD-10-CM

## 2011-01-24 DIAGNOSIS — Y831 Surgical operation with implant of artificial internal device as the cause of abnormal reaction of the patient, or of later complication, without mention of misadventure at the time of the procedure: Secondary | ICD-10-CM | POA: Insufficient documentation

## 2011-01-24 DIAGNOSIS — J4489 Other specified chronic obstructive pulmonary disease: Secondary | ICD-10-CM | POA: Insufficient documentation

## 2011-01-24 DIAGNOSIS — I10 Essential (primary) hypertension: Secondary | ICD-10-CM | POA: Insufficient documentation

## 2011-01-24 DIAGNOSIS — I2589 Other forms of chronic ischemic heart disease: Secondary | ICD-10-CM | POA: Insufficient documentation

## 2011-01-24 DIAGNOSIS — T82190A Other mechanical complication of cardiac electrode, initial encounter: Secondary | ICD-10-CM

## 2011-01-24 HISTORY — DX: Gastro-esophageal reflux disease without esophagitis: K21.9

## 2011-01-24 LAB — SURGICAL PCR SCREEN: MRSA, PCR: NEGATIVE

## 2011-01-24 SURGERY — REMOVAL, ELECTRODE LEAD, ICD
Anesthesia: General | Site: Chest | Wound class: Clean

## 2011-01-24 MED ORDER — SODIUM CHLORIDE 0.9 % IR SOLN
Status: DC | PRN
  Administered 2011-01-24: 11:00:00

## 2011-01-24 MED ORDER — MAGNESIUM HYDROXIDE 400 MG/5ML PO SUSP: 30.0000 mL | Freq: Every day | ORAL | Status: DC | PRN

## 2011-01-24 MED ORDER — MUPIROCIN 2 % EX OINT
TOPICAL_OINTMENT | CUTANEOUS | Status: AC
  Administered 2011-01-24: 1 via NASAL
  Filled 2011-01-24: qty 22

## 2011-01-24 MED ORDER — ACETAMINOPHEN 500 MG PO TABS
1000.0000 mg | ORAL_TABLET | Freq: Four times a day (QID) | ORAL | Status: AC
  Administered 2011-01-24 – 2011-01-25 (×4): 1000 mg via ORAL
  Filled 2011-01-24 (×4): qty 2

## 2011-01-24 MED ORDER — MUPIROCIN 2 % EX OINT
TOPICAL_OINTMENT | Freq: Two times a day (BID) | CUTANEOUS | Status: DC
  Administered 2011-01-24: 1 via NASAL
  Administered 2011-01-25 (×2): via NASAL
  Filled 2011-01-24 (×2): qty 22

## 2011-01-24 MED ORDER — ROCURONIUM BROMIDE 100 MG/10ML IV SOLN
INTRAVENOUS | Status: DC | PRN
  Administered 2011-01-24: 40 mg via INTRAVENOUS

## 2011-01-24 MED ORDER — INFLUENZA VIRUS VACC SPLIT PF IM SUSP
0.5000 mL | INTRAMUSCULAR | Status: DC
  Filled 2011-01-24: qty 0.5

## 2011-01-24 MED ORDER — EPHEDRINE SULFATE 50 MG/ML IJ SOLN
INTRAMUSCULAR | Status: DC | PRN
  Administered 2011-01-24 (×2): 10 mg via INTRAVENOUS

## 2011-01-24 MED ORDER — ONDANSETRON HCL 4 MG/2ML IJ SOLN: 4.0000 mg | Freq: Four times a day (QID) | INTRAMUSCULAR | Status: DC | PRN

## 2011-01-24 MED ORDER — FENTANYL CITRATE 0.05 MG/ML IJ SOLN
INTRAMUSCULAR | Status: DC | PRN
  Administered 2011-01-24: 100 ug via INTRAVENOUS
  Administered 2011-01-24: 50 ug via INTRAVENOUS

## 2011-01-24 MED ORDER — SODIUM CHLORIDE 0.9 % IR SOLN: Status: DC | PRN

## 2011-01-24 MED ORDER — IOHEXOL 350 MG/ML SOLN
INTRAVENOUS | Status: DC | PRN
  Administered 2011-01-24: 20 mL via INTRAVENOUS

## 2011-01-24 MED ORDER — SODIUM CHLORIDE 0.9 % IV SOLN
10.0000 mg | INTRAVENOUS | Status: DC | PRN
  Administered 2011-01-24: 50 ug/min via INTRAVENOUS

## 2011-01-24 MED ORDER — ALUM & MAG HYDROXIDE-SIMETH 200-200-20 MG/5ML PO SUSP
15.0000 mL | ORAL | Status: DC | PRN
  Administered 2011-01-25: 30 mL via ORAL
  Filled 2011-01-24: qty 30

## 2011-01-24 MED ORDER — MIDAZOLAM HCL 5 MG/5ML IJ SOLN
INTRAMUSCULAR | Status: DC | PRN
  Administered 2011-01-24: 1 mg via INTRAVENOUS

## 2011-01-24 MED ORDER — LACTATED RINGERS IV SOLN
INTRAVENOUS | Status: DC | PRN
  Administered 2011-01-24: 11:00:00 via INTRAVENOUS

## 2011-01-24 MED ORDER — CARVEDILOL 25 MG PO TABS
25.0000 mg | ORAL_TABLET | Freq: Two times a day (BID) | ORAL | Status: DC
  Administered 2011-01-24 – 2011-01-25 (×2): 25 mg via ORAL
  Filled 2011-01-24 (×4): qty 1

## 2011-01-24 MED ORDER — LOPERAMIDE HCL 2 MG PO CAPS: 4.0000 mg | ORAL_CAPSULE | Freq: Once | ORAL | Status: AC | PRN

## 2011-01-24 MED ORDER — ACETAMINOPHEN 325 MG PO TABS: 325.0000 mg | ORAL_TABLET | ORAL | Status: DC | PRN

## 2011-01-24 MED ORDER — ONDANSETRON HCL 4 MG/2ML IJ SOLN
INTRAMUSCULAR | Status: DC | PRN
  Administered 2011-01-24: 4 mg via INTRAVENOUS

## 2011-01-24 MED ORDER — HYDROMORPHONE HCL PF 1 MG/ML IJ SOLN: 0.2500 mg | INTRAMUSCULAR | Status: DC | PRN

## 2011-01-24 MED ORDER — ALLOPURINOL 100 MG PO TABS
100.0000 mg | ORAL_TABLET | Freq: Every day | ORAL | Status: DC
  Administered 2011-01-25: 100 mg via ORAL
  Filled 2011-01-24 (×2): qty 1

## 2011-01-24 MED ORDER — DROPERIDOL 2.5 MG/ML IJ SOLN: 0.6250 mg | INTRAMUSCULAR | Status: DC | PRN

## 2011-01-24 MED ORDER — PROPOFOL 10 MG/ML IV EMUL
INTRAVENOUS | Status: DC | PRN
  Administered 2011-01-24: 80 mg via INTRAVENOUS

## 2011-01-24 MED ORDER — SODIUM CHLORIDE 0.9 % IV SOLN
INTRAVENOUS | Status: DC | PRN
  Administered 2011-01-24: 12:00:00

## 2011-01-24 MED ORDER — SODIUM CHLORIDE 0.9 % IV SOLN: 250.0000 mL | INTRAVENOUS | Status: DC

## 2011-01-24 MED ORDER — LOPERAMIDE HCL 2 MG PO CAPS: 2.0000 mg | ORAL_CAPSULE | ORAL | Status: DC | PRN

## 2011-01-24 MED ORDER — LOSARTAN POTASSIUM 25 MG PO TABS
25.0000 mg | ORAL_TABLET | Freq: Every day | ORAL | Status: DC
  Administered 2011-01-24 – 2011-01-25 (×2): 25 mg via ORAL
  Filled 2011-01-24 (×2): qty 1

## 2011-01-24 MED ORDER — SPIRONOLACTONE 25 MG PO TABS
25.0000 mg | ORAL_TABLET | Freq: Every day | ORAL | Status: DC
  Administered 2011-01-24 – 2011-01-25 (×2): 25 mg via ORAL
  Filled 2011-01-24 (×2): qty 1

## 2011-01-24 MED ORDER — 0.9 % SODIUM CHLORIDE (POUR BTL) OPTIME
TOPICAL | Status: DC | PRN
  Administered 2011-01-24: 1000 mL

## 2011-01-24 MED ORDER — IOHEXOL 350 MG/ML SOLN: INTRAVENOUS | Status: DC | PRN

## 2011-01-24 MED ORDER — FUROSEMIDE 40 MG PO TABS
40.0000 mg | ORAL_TABLET | Freq: Two times a day (BID) | ORAL | Status: DC
  Administered 2011-01-24 – 2011-01-25 (×2): 40 mg via ORAL
  Filled 2011-01-24 (×4): qty 1

## 2011-01-24 MED ORDER — SODIUM CHLORIDE 0.9 % IR SOLN
80.0000 mg | Status: DC
  Filled 2011-01-24: qty 2

## 2011-01-24 MED ORDER — GUAIFENESIN-DM 100-10 MG/5ML PO SYRP
15.0000 mL | ORAL_SOLUTION | ORAL | Status: DC | PRN
  Administered 2011-01-24: 15 mL via ORAL
  Filled 2011-01-24: qty 5

## 2011-01-24 MED ORDER — PHENYLEPHRINE HCL 10 MG/ML IJ SOLN
INTRAMUSCULAR | Status: DC | PRN
  Administered 2011-01-24 (×3): 80 ug via INTRAVENOUS

## 2011-01-24 MED ORDER — ZOLPIDEM TARTRATE 5 MG PO TABS
5.0000 mg | ORAL_TABLET | Freq: Every evening | ORAL | Status: DC | PRN
  Administered 2011-01-24: 5 mg via ORAL
  Filled 2011-01-24: qty 1

## 2011-01-24 MED ORDER — HYDROCORTISONE 1 % EX CREA
1.0000 "application " | TOPICAL_CREAM | Freq: Three times a day (TID) | CUTANEOUS | Status: DC | PRN
  Filled 2011-01-24: qty 28

## 2011-01-24 MED ORDER — PRAMOXINE HCL 1 % RE OINT
1.0000 "application " | TOPICAL_OINTMENT | Freq: Three times a day (TID) | RECTAL | Status: DC | PRN
  Filled 2011-01-24: qty 30

## 2011-01-24 SURGICAL SUPPLY — 38 items
BENZOIN TINCTURE PRP APPL 2/3 (GAUZE/BANDAGES/DRESSINGS) ×2 IMPLANT
BLADE SURG ROTATE 9660 (MISCELLANEOUS) ×2 IMPLANT
CANISTER SUCTION 2500CC (MISCELLANEOUS) ×2 IMPLANT
CLOTH BEACON ORANGE TIMEOUT ST (SAFETY) ×2 IMPLANT
DRAPE C-ARM 42X72 X-RAY (DRAPES) ×2 IMPLANT
DRAPE CARDIOVASCULAR INCISE (DRAPES) ×1
DRAPE INCISE IOBAN 66X45 STRL (DRAPES) ×2 IMPLANT
DRAPE PROXIMA HALF (DRAPES) ×4 IMPLANT
DRAPE SRG 135X102X78XABS (DRAPES) ×1 IMPLANT
DRSG TEGADERM 4X4.75 (GAUZE/BANDAGES/DRESSINGS) ×2 IMPLANT
ELECT REM PT RETURN 9FT ADLT (ELECTROSURGICAL) ×2
ELECTRODE REM PT RTRN 9FT ADLT (ELECTROSURGICAL) ×1 IMPLANT
GAUZE SPONGE 4X4 16PLY XRAY LF (GAUZE/BANDAGES/DRESSINGS) ×2 IMPLANT
GLOVE BIO SURGEON STRL SZ8 (GLOVE) ×4 IMPLANT
GLOVE BIOGEL PI IND STRL 6 (GLOVE) ×1 IMPLANT
GLOVE BIOGEL PI IND STRL 7.5 (GLOVE) ×1 IMPLANT
GLOVE BIOGEL PI IND STRL 8 (GLOVE) ×1 IMPLANT
GLOVE BIOGEL PI INDICATOR 6 (GLOVE) ×1
GLOVE BIOGEL PI INDICATOR 7.5 (GLOVE) ×1
GLOVE BIOGEL PI INDICATOR 8 (GLOVE) ×1
GOWN PREVENTION PLUS XLARGE (GOWN DISPOSABLE) ×6 IMPLANT
GOWN STRL NON-REIN LRG LVL3 (GOWN DISPOSABLE) ×6 IMPLANT
KIT ROOM TURNOVER OR (KITS) ×2 IMPLANT
Medtronic Sprint Quattro Secure S ventricular lead (Electrode) ×2 IMPLANT
PAD ARMBOARD 7.5X6 YLW CONV (MISCELLANEOUS) ×4 IMPLANT
PENCIL BUTTON HOLSTER BLD 10FT (ELECTRODE) IMPLANT
SPONGE GAUZE 4X4 12PLY (GAUZE/BANDAGES/DRESSINGS) ×2 IMPLANT
STRIP CLOSURE SKIN 1/2X4 (GAUZE/BANDAGES/DRESSINGS) ×2 IMPLANT
SUT PROLENE 2 0 CT2 30 (SUTURE) ×4 IMPLANT
SUT SILK 0 FSL (SUTURE) IMPLANT
SUT VIC AB 2-0 CT2 18 VCP726D (SUTURE) ×2 IMPLANT
SUT VIC AB 3-0 X1 27 (SUTURE) ×2 IMPLANT
TAPE CLOTH SURG 4X10 WHT LF (GAUZE/BANDAGES/DRESSINGS) ×2 IMPLANT
TOWEL OR 17X24 6PK STRL BLUE (TOWEL DISPOSABLE) ×8 IMPLANT
TOWEL OR 17X26 10 PK STRL BLUE (TOWEL DISPOSABLE) ×4 IMPLANT
TRAY FOLEY CATH 14FR (SET/KITS/TRAYS/PACK) ×2 IMPLANT
TUBE CONNECTING 12X1/4 (SUCTIONS) ×2 IMPLANT
YANKAUER SUCT BULB TIP NO VENT (SUCTIONS) ×2 IMPLANT

## 2011-01-24 NOTE — Anesthesia Postprocedure Evaluation (Signed)
  Anesthesia Post-op Note  Patient: [name] Macias  Procedure(s) Performed:  ICD LEAD REMOVAL - revision of LV and RV lead  Patient Location: PACU  Anesthesia Type: General  Level of Consciousness: awake, alert  and oriented  Airway and Oxygen Therapy: Patient Spontanous Breathing and Patient connected to nasal cannula oxygen  Post-op Pain: none  Post-op Assessment: Post-op Vital signs reviewed, Patient's Cardiovascular Status Stable, Respiratory Function Stable, Patent Airway, No signs of Nausea or vomiting and Pain level controlled  Post-op Vital Signs: Reviewed and stable  Complications: No apparent anesthesia complications

## 2011-01-24 NOTE — Op Note (Signed)
ICD system revision with extraction of an old LV lead, insertion of a new ICD lead, ICD pocket revision, coronary sinus venography and ICD testing without immediate complication. [id number].

## 2011-01-24 NOTE — Progress Notes (Signed)
Orthopedic Tech Progress Note Patient Details:  [name] Macias 30-Apr-1932 [id number]  Other Ortho Devices Type of Ortho Device: Other (comment) (foam arm sling) Ortho Device Location: left arm Ortho Device Interventions: Application   [name] 01/24/2011, 3:35 PM

## 2011-01-24 NOTE — Preoperative (Signed)
Beta Blockers   Reason not to administer Beta Blockers:Not Applicable 

## 2011-01-24 NOTE — Interval H&P Note (Signed)
History and Physical Interval Note:  01/24/2011 9:47 AM  [name] Macias  has presented today for surgery, with the diagnosis of malfunctioning left ventricular lead  The various methods of treatment have been discussed with the patient and family. After consideration of risks, benefits and other options for treatment, the patient has consented to  Procedure(s): ICD LEAD REMOVAL and insertion of new pacing/ICD leads with possible insertion of new device as a surgical intervention .  The patients' history has been reviewed, patient examined, no change in status, stable for surgery.  I have reviewed the patients' chart and labs.  Questions were answered to the patient's satisfaction.     Lewayne Bunting

## 2011-01-24 NOTE — Progress Notes (Signed)
Travis (Medtronic Rep.) aware of Mr. Wiesman's procedure at 70.

## 2011-01-24 NOTE — Progress Notes (Signed)
Dr. Ladona Ridgel paged to fill out AICD form. Dr. Ladona Ridgel currently in procedure and representative from room states Dr. Ladona Ridgel will fill out form once he arrives to holding area.

## 2011-01-24 NOTE — H&P (View-Only) (Signed)
HPI [name] Macias returns today for followup. He is a pleasant 78 yo man with a h/o DCM, Chronic systolic heart failure, s/p BiV ICD. He recently received an ICD shock. It was my impression that he received an ICD shock due to bidirectional VT. However on review of his ICD system, we noted that when he underwent ICD generator change, his LV lead was placed in the RV port. In addition, the RV lead was in the LV port and appears not to be working, at least intermittantly. He is not PPM dependent. He denies chest pain. His CHF symptoms in the past several months are class 3. Interogation of his ICD demonstrates that he is not capturing in the left ventricle, and at time in the right ventricle. He has a 6949 lead in the RV. Allergies  Allergen Reactions  . Enalapril Maleate   . Sulfonamide Derivatives      Current Outpatient Prescriptions  Medication Sig Dispense Refill  . allopurinol (ZYLOPRIM) 100 MG tablet Take 1 tablet (100 mg total) by mouth daily.  90 tablet  1  . aspirin (ASPIRIN LOW DOSE) 81 MG EC tablet Take 81 mg by mouth daily.        . carvedilol (COREG) 25 MG tablet Take 1 tablet (25 mg total) by mouth 2 (two) times daily with a meal.  180 tablet  1  . furosemide (LASIX) 40 MG tablet Take 1 tablet (40 mg total) by mouth 2 (two) times daily.  180 tablet  1  . losartan (COZAAR) 25 MG tablet Take 1 tablet (25 mg total) by mouth daily.  90 tablet  1  . nitroGLYCERIN (NITROSTAT) 0.4 MG SL tablet Place 0.4 mg under the tongue as directed.        . NON FORMULARY 2 mLs. Oxygen. Daily at night and PRN during the day.       . Potassium 95 MG TBCR Take by mouth.        . spironolactone (ALDACTONE) 25 MG tablet Take 1 tablet (25 mg total) by mouth daily.  90 tablet  1  . traMADol (ULTRAM) 50 MG tablet One tablet at bedtime , as needed for shoulder pain  90 tablet  1     Past Medical History  Diagnosis Date  . Arthritis   . Gout   . Non-ischemic cardiomyopathy   . Erectile dysfunction   .  Anxiety disorder   . Hypertension   . Dementia   . Arthritis     left foot   . CAD (coronary artery disease) of artery bypass graft   . COPD (chronic obstructive pulmonary disease)   . History of syphilis   . Type 2 HSV infection of penis     ROS:   All systems reviewed and negative except as noted in the HPI.   Past Surgical History  Procedure Date  . Left ankle     Not Done by Dr. Harrison   . S/p  biventricular pacemaker / aicd implantation 02/19/05  . Defribrillator implant   . Crtd upgrade 12/14/2009  . Transthoracic echocardiogram 2011  . Doppler echocardiography 2009     Family History  Problem Relation Age of Onset  . Heart failure Father   . Prostate cancer Brother   . Diabetes Brother   . Hypertension Brother   . Heart failure Brother      History   Social History  . Marital Status: Married    Spouse Name: N/A    Number of Children:   3  . Years of Education: N/A   Occupational History  . disabled     Social History Main Topics  . Smoking status: Former Smoker -- 1.0 packs/day for 10 years    Types: Cigarettes    Quit date: 01/15/1963  . Smokeless tobacco: Never Used  . Alcohol Use: No  . Drug Use: No  . Sexually Active: Not on file   Other Topics Concern  . Not on file   Social History Narrative  . No narrative on file     BP 112/60  Pulse 58  Ht 5' 8" (1.727 m)  Wt 76.204 kg (168 lb)  BMI 25.54 kg/m2  SpO2 99%  Physical Exam:  Well appearing elderly man, NAD HEENT: Unremarkable Neck:  No JVD, no thyromegally Lymphatics:  No adenopathy Back:  No CVA tenderness Lungs:  Clear with no wheezes. HEART:  Regular rate rhythm, no murmurs, no rubs, no clicks Abd:  soft, positive bowel sounds, no organomegally, no rebound, no guarding Ext:  2 plus pulses, no edema, no cyanosis, no clubbing Skin:  No rashes no nodules Neuro:  CN II through XII intact, motor grossly intact  EKG NSR with RBBB.  DEVICE  Abnormal device function.  See  PaceArt for details.   Assess/Plan:   

## 2011-01-24 NOTE — Transfer of Care (Signed)
Immediate Anesthesia Transfer of Care Note  Patient: [name] Macias  Procedure(s) Performed:  ICD LEAD REMOVAL - revision of LV and RV lead  Patient Location: PACU  Anesthesia Type: General  Level of Consciousness: awake, alert , oriented and patient cooperative  Airway & Oxygen Therapy: Patient Spontanous Breathing and Patient connected to nasal cannula oxygen  Post-op Assessment: Report given to PACU RN, Post -op Vital signs reviewed and stable and Patient moving all extremities  Post vital signs: Reviewed and stable Filed Vitals:   01/24/11 1338  BP:   Pulse:   Temp: 36.1 C  Resp:     Complications: No apparent anesthesia complications

## 2011-01-24 NOTE — Anesthesia Preprocedure Evaluation (Addendum)
Anesthesia Evaluation  Patient identified by MRN, date of birth, ID band Patient awake    Reviewed: Allergy & Precautions, H&P , NPO status , Patient's Chart, lab work & pertinent test results, reviewed documented beta blocker date and time   History of Anesthesia Complications Negative for: history of anesthetic complications  Airway Mallampati: II TM Distance: >3 FB     Dental  (+) Edentulous Upper and Edentulous Lower   Pulmonary COPD COPD inhaler,  clear to auscultation  Pulmonary exam normal       Cardiovascular hypertension, + angina with exertion + CAD and + Past MI + pacemaker + Cardiac Defibrillator Regular Normal- Systolic murmurs    Neuro/Psych Anxiety Negative Neurological ROS     GI/Hepatic Neg liver ROS, GERD-  Controlled,  Endo/Other  Negative Endocrine ROS  Renal/GU negative Renal ROS     Musculoskeletal   Abdominal   Peds  Hematology   Anesthesia Other Findings   Reproductive/Obstetrics                          Anesthesia Physical Anesthesia Plan  ASA: III  Anesthesia Plan: General   Post-op Pain Management:    Induction: Intravenous  Airway Management Planned: Oral ETT  Additional Equipment: CVP and Arterial line  Intra-op Plan:   Post-operative Plan: Extubation in OR  Informed Consent: I have reviewed the patients History and Physical, chart, labs and discussed the procedure including the risks, benefits and alternatives for the proposed anesthesia with the patient or authorized representative who has indicated his/her understanding and acceptance.     Plan Discussed with: CRNA, Anesthesiologist and Surgeon  Anesthesia Plan Comments:         Anesthesia Quick Evaluation

## 2011-01-25 ENCOUNTER — Inpatient Hospital Stay (HOSPITAL_COMMUNITY): Payer: Medicare Other

## 2011-01-25 ENCOUNTER — Other Ambulatory Visit: Payer: Self-pay

## 2011-01-25 DIAGNOSIS — I2589 Other forms of chronic ischemic heart disease: Secondary | ICD-10-CM

## 2011-01-25 NOTE — Progress Notes (Signed)
Remove IJ per orders. Patient tolerated procedure. Pressure held for 7 minutes and no active bleeding noted.Intstructed patient to notify staff if experience symptoms (swell of site, bleeding, difficulty swallowing, shortness of breath, pain and inability to turn neck). Also took patient that there is a risk of bleeding. Patient signaled understanding by shaking head. Will tell primary nurse about removal.

## 2011-01-25 NOTE — Progress Notes (Signed)
01/25/2011 8:56 AM   Nursing Note:  NSMT called RN to inform of pt. Having 15 beats SVT. Pt. Asleep, symptomatic. Scheduled coreg given. Dayna Dunn PA-C called and made aware. No new orders. Will continue to monitor closely. Maylee Bare, Blanchard Kelch

## 2011-01-25 NOTE — Discharge Summary (Signed)
CARDIOLOGY DISCHARGE SUMMARY   Patient ID: [name] Macias MRN: [id number] DOB/AGE: [birth year] 78 y.o.  Admit date: 01/24/2011 Discharge date: 01/25/2011  Primary Discharge Diagnosis:  History of dilated cardiomyopathy with chronic systolic CHF, abnormal ICD function Secondary Discharge Diagnosis:  Patient Active Problem List  Diagnoses  . GOUT  . SLEEP RELATED HYPOVENTILATION/HYPOXEMIA CCE  . CARDIOMYOPATHY  . CHRONIC SYSTOLIC HEART FAILURE  . RESPIRATORY FAILURE, ACUTE  . GYNECOMASTIA  . SHOULDER, ARTHRITIS, DEGEN./OSTEO  . INSOMNIA  . AUTOMATIC IMPLANTABLE CARDIAC DEFIBRILLATOR SITU  . OTHER SPECIFIED DISORDER OF SKIN  . Elevated PSA  . Prediabetes  . HTN (hypertension)   Procedures:  1.Extraction of a previously implanted left ventricular lead, which had withdrawn back into the coronary sinus,  2.insertion of a new defibrillator lead secondary to the patient having a previously implanted 6949 Medtronic lead  3. capping of the old Medtronic lead  4. coronary sinus venography with attempted new left ventricular lead placement  5. ICD pocket revision  6. defibrillation threshold testing.  7. Two-view chest x-ray. 8. placement of an IJ catheter  Hospital Course: [name] Macias is a 78 year old male with nonischemic, dilated cardiomyopathy. He was evaluated by Dr. Ladona Ridgel and noted to have abnormal ICD function. He also has a 6949 Medtronic lead. He was brought into the hospital for device revision.  [name] Macias had the above-described procedures under general anesthesia. Dr. Ladona Ridgel made multiple attempts to place a new LV lead but was unable to do so. With the patient's underlying right bundle-branch block and septal position of the RV lead, it was deemed most appropriate at this point to abandon attempts to place the new LV lead. He tolerated the procedure well. A sling was applied by the orthopedic tech for comfort. His IJ catheter was removed by protocol without complication.   On  01/25/2011, his chest x-ray did not have a pneumothorax or any new abnormalities. He was ambulating without chest pain or shortness of breath. He was evaluated by Dr. Johney Frame and considered stable for discharge, to followup as an outpatient.  Labs:   Lab Results  Component Value Date   WBC 7.4 01/17/2011   HGB 13.9 01/17/2011   HCT 42.3 01/17/2011   MCV 92.2 01/17/2011   PLT 278 01/17/2011    BMET    Component Value Date/Time   NA 141 01/17/2011 1207   K 4.3 01/17/2011 1207   CL 105 01/17/2011 1207   CO2 22 01/17/2011 1207   GLUCOSE 97 01/17/2011 1207   BUN 23 01/17/2011 1207   CREATININE 1.38* 01/17/2011 1207   CREATININE 1.92* 06/19/2010 2110   CALCIUM 9.8 01/17/2011 1207   GFRNONAA 34* 06/19/2010 2110   GFRAA  Value: 41        The eGFR has been calculated using the MDRD equation. This calculation has not been validated in all clinical situations. eGFR's persistently <60 mL/min signify possible Chronic Kidney Disease.* 06/19/2010 2110   Radiology: 01/25/2011 CHEST - 2 VIEW Comparison: 01/24/2011 Findings: AICD and right central line remain in place, unchanged. Cardiomegaly. There is hyperinflation of the lungs compatible with COPD. Scarring or atelectasis in the left lung base. No effusions. No pneumothorax.  IMPRESSION: No significant change.  EKG: Sinus rhythm rate 78 with PVCs and couplets Rightward axis Right bundle branch block Cannot rule out Septal infarct , age undetermined Pacing spikes noted. COMPARED WITH previous ECG intrinsic ventricular rhythm is now evident   FOLLOW UP PLANS AND APPOINTMENTS Discharge Orders    Future Appointments:  Provider: Department: Dept Phone: Center:   02/04/2011 9:30 AM Vella Kohler [facility] [phone] [facility]  Wound check ad [facility] cardiology on January 21 at 9:30          Current Discharge Medication List    CONTINUE these medications which have NOT CHANGED   Details  allopurinol (ZYLOPRIM) 100 MG tablet Take 100 mg by mouth  daily.      aspirin (ASPIRIN LOW DOSE) 81 MG EC tablet Take 81 mg by mouth daily.      benzonatate (TESSALON) 100 MG capsule Take 100 mg by mouth 3 (three) times daily as needed. For cough     carvedilol (COREG) 25 MG tablet Take 25 mg by mouth 2 (two) times daily with a meal.      furosemide (LASIX) 40 MG tablet Take 40 mg by mouth 2 (two) times daily.      losartan (COZAAR) 25 MG tablet Take 25 mg by mouth daily.      NON FORMULARY 2 mLs. Oxygen. Daily at night and PRN during the day.    Associated Diagnoses: Automatic implantable cardiac defibrillator in situ    spironolactone (ALDACTONE) 25 MG tablet Take 25 mg by mouth daily.      traMADol (ULTRAM) 50 MG tablet Take 50 mg by mouth at bedtime as needed. One tablet at bedtime , as needed for shoulder pain     nitroGLYCERIN (NITROSTAT) 0.4 MG SL tablet Place 0.4 mg under the tongue every 5 (five) minutes as needed. For chest pain       Follow-up Information    Follow up with Lewayne Bunting, MD. (See in 3 months, the office will call)    Contact information:   [address] [place] [address] [phone]       Follow up with Syliva Overman, MD in 3 weeks. (As needed)    Contact information:   [address] [place] [address] [phone]          BRING ALL MEDICATIONS WITH YOU TO FOLLOW UP APPOINTMENTS  Time spent with patient to include physician time: Signed: Theodore Demark 01/25/2011, 3:07 PM Co-Sign MD

## 2011-01-25 NOTE — Op Note (Signed)
NAMEBRANDUN, PINN NO.:  [id number]  MEDICAL RECORD NO.:  [id number]  LOCATION:  2034                         FACILITY:  [facility]  PHYSICIAN:  Doylene Canning. Ladona Ridgel, MD    DATE OF BIRTH:  [birth year]  DATE OF PROCEDURE:  01/24/2011 DATE OF DISCHARGE:                              OPERATIVE REPORT   PROCEDURE PERFORMED:  Extraction of a previously implanted left ventricular lead, which had withdrawn back into the coronary sinus, insertion of a new defibrillator lead secondary to the patient's have a previously implanted 6949 Medtronic lead and with capping of the old Medtronic lead and coronary sinus venography with attempted new left ventricular lead placement, and ICD pocket revision and defibrillation threshold testing.  INTRODUCTION:  The patient is a 78 year old man with a longstanding ischemic cardiomyopathy and congestive heart failure.  He has right bundle-branch block and prolonged QRS duration of over 150 msec.  He underwent initial ICD implantation in 2007.  He underwent generator change several months ago and he was found to have over sensing on the RV port, which was hooked up to the LV lead on purpose.  He was subsequently found to have withdrawal of the LV lead into the coronary sinus.  He is now referred for device revision.  PROCEDURE:  After informed consent was obtained, the patient was taken to the operating room in a fasting state.  General anesthesia was utilized for sedation.  Arterial monitoring was utilized during the procedure.  A 7-cm incision was carried out over the left infraclavicular region.  Electrocautery was utilized to dissect down to the ICD pocket.  Electrocautery was utilized to free up the dense fibrous adhesions.  At this point, the left subclavian vein was punctured x2 and a guidewire was advanced into the central circulation.  The fibrous adhesions around the LV lead were freed up as well and gentle traction was utilized to  remove the LV lead.  Note, this took several minutes as the lead was drowned up by very dense fibrous scar tissue.  A Liberator locking stylet was not employed because we had hoped to maintain central venous access as a backup.  Having accomplished this, the vein was dilated up utilizing 6-French and 7- [place] dilators, and the new Medtronic model 6935, 65 cm active fixation single-coil defibrillation lead serial number [id number] was advanced into the right ventricle and mapping was carried out.  At the final site on the RV septum, the R-waves measured approximately 10 mV.  The pacing impedance was 900 ohms.  The threshold was less than a V at 0.5 msec and there was a large current of injury with active fixation of the lead. 10 V pacing not stimulated the diaphragm.  With the right ventricular lead in satisfactory position, attention was then turned to placement of the LV lead.  The coronary sinus guiding catheter was advanced along with a 6-French HexaPolar EP catheter into the right atrium.  The coronary sinus was cannulated with a modest amount of difficulty. Venography of the coronary sinus was then carried out.  This demonstrated a very posterior vein with a very tortuous takeoff and no additional suitable coronary  venous anatomy.  The old lateral vein, which had been initially utilized for LV lead placement, was occluded. Multiple attempts to cannulate the posterior vein were carried out, but because of its tortuous nature, it was deemed not readily amenable for LV lead placement.  With the patient's underlying right bundle-branch block and septal position of the RV lead, it was deemed most appropriate at this point to abandon attempts to place the new LV lead.  At this point, the guiding catheter was removed.  Electrocautery was utilized to make subcutaneous pocket.  The defibrillator lead was connected to the fascia plane with silk suture and the sewing sleeve was also secured  with silk suture.  After the pocket was revised, electrocautery was utilized to assure hemostasis. Having accomplished this, the Medtronic Protecta XT Bi-V ICD with the LV port capped and the SVC coil capped was placed back in the subcutaneous pocket.  Previously, the 6949 lead had been capped.  The pocket was irrigated with antibiotic irrigation and the patient underwent defibrillation threshold testing while under general anesthesia.  After VF was induced with a T-wave shock, a 20-joule shock was subsequently delivered, which terminated the ventricular fibrillation and restored sinus rhythm.  The shock impedance was around 70 ohms.  The incision was then closed with 2-0 and 3-0 Vicryl.  Benzoin and Steri- Strips were painted on the skin, a pressure dressing was applied, and the patient was returned to his room in satisfactory condition.  COMPLICATIONS:  There were no immediate procedure complications.  RESULTS:  This study demonstrates successful lead extraction of a 5-year- old left ventricular pacing lead; capping of the defibrillator lead, which was a 6949 lead; followed by successful new insertion of a Medtronic single-coil active fixation defibrillation lead; and successful ICD pocket revision.  Defibrillation threshold testing demonstrated satisfactory DFT.     Doylene Canning. Ladona Ridgel, MD     GWT/MEDQ  D:  01/24/2011  T:  01/25/2011  Job:  [id number]

## 2011-01-25 NOTE — Progress Notes (Signed)
01/25/2011 4:31 PM Nursing Note:  Discharge AVS form , follow up appointments, incision care, medications taken today and those due this evening explained, arm restrictions, when to call MD all discussed with patient and family member. Both verbalized understanding. D/c iv. D/c tele. D/ c home per orders.  Joenathan Sakuma, Blanchard Kelch

## 2011-01-25 NOTE — Progress Notes (Addendum)
01/25/2011 3:11 PM Nursing Note:  Pt. And wife viewed educational video #114 on ICD. Questions and concerns addressed.  Kynslee Baham, Blanchard Kelch  3:43 PM Pt. And wife also instructed on left arm movement restrictions over next week.  Lunah Losasso, Blanchard Kelch

## 2011-01-25 NOTE — Progress Notes (Signed)
SUBJECTIVE: The patient is doing well today s/p ICD system revision by Dr Ladona Ridgel yesterday.  At this time, he denies chest pain, shortness of breath, or any new concerns.  He feels well and wants to go home.     Marland Kitchen acetaminophen  1,000 mg Oral Q6H  . allopurinol  100 mg Oral Daily  . carvedilol  25 mg Oral BID WC  . furosemide  40 mg Oral BID  . influenza  inactive virus vaccine  0.5 mL Intramuscular Tomorrow-1000  . losartan  25 mg Oral Daily  . mupirocin ointment   Nasal BID  . spironolactone  25 mg Oral Daily  . DISCONTD: chlorhexidine  60 mL Topical Once  . DISCONTD: gentamicin irrigation  80 mg Irrigation On Call  . DISCONTD: gentamicin irrigation  80 mg Irrigation To 282      . DISCONTD: sodium chloride Stopped (01/24/11 1717)  . DISCONTD: sodium chloride Stopped (01/24/11 1717)  . DISCONTD: sodium chloride Stopped (01/24/11 1717)    OBJECTIVE: Physical Exam: Filed Vitals:   01/24/11 1640 01/24/11 1719 01/24/11 2012 01/25/11 0500  BP: 148/74 134/80 123/68 120/69  Pulse: 66 73 75 80  Temp: 97.3 F (36.3 C) 97.1 F (36.2 C) 97.1 F (36.2 C) 97.4 F (36.3 C)  TempSrc:  Oral Oral Oral  Resp: 25 19 20 20   Height:  5\' 8"  (1.727 m)    Weight:  172 lb (78.019 kg)  165 lb 5.5 oz (75 kg)  SpO2: 100% 94% 95% 97%    Intake/Output Summary (Last 24 hours) at 01/25/11 1255 Last data filed at 01/25/11 0500  Gross per 24 hour  Intake   2040 ml  Output   1850 ml  Net    190 ml    Telemetry reveals sinus rhythm  GEN- The patient is elderly appearing, alert and oriented x 3 today.   Head- normocephalic, atraumatic Eyes-  Sclera clear, conjunctiva pink Ears- hearing intact Oropharynx- clear Neck- supple, no JVP ICD pocket without hematoma Lungs- Clear to ausculation bilaterally, normal work of breathing Heart- Regular rate and rhythm, no murmurs, rubs or gallops, PMI not laterally displaced GI- soft, NT, ND, + BS Extremities- no clubbing, cyanosis, or edema  ICD  interrogation is reviewed today.  Reveals >80% V pacing,  Will therefore reprogramm DDI 40 bpm today to hopefully avoid V pacing, can follow-up with Dr Ladona Ridgel for this.  LABS: Basic Metabolic Panel: No results found for this basename: NA:2,K:2,CL:2,CO2:2,GLUCOSE:2,BUN:2,CREATININE:2,CALCIUM:2,MG:2,PHOS:2 in the last 72 hours Liver Function Tests: No results found for this basename: AST:2,ALT:2,ALKPHOS:2,BILITOT:2,PROT:2,ALBUMIN:2 in the last 72 hours No results found for this basename: LIPASE:2,AMYLASE:2 in the last 72 hours CBC: No results found for this basename: WBC:2,NEUTROABS:2,HGB:2,HCT:2,MCV:2,PLT:2 in the last 72 hours Cardiac Enzymes: No results found for this basename: CKTOTAL:3,CKMB:3,CKMBINDEX:3,TROPONINI:3 in the last 72 hours BNP: No components found with this basename: POCBNP:3 D-Dimer: No results found for this basename: DDIMER:2 in the last 72 hours Hemoglobin A1C: No results found for this basename: HGBA1C in the last 72 hours Fasting Lipid Panel: No results found for this basename: CHOL,HDL,LDLCALC,TRIG,CHOLHDL,LDLDIRECT in the last 72 hours Thyroid Function Tests: No results found for this basename: TSH,T4TOTAL,FREET3,T3FREE,THYROIDAB in the last 72 hours Anemia Panel: No results found for this basename: VITAMINB12,FOLATE,FERRITIN,TIBC,IRON,RETICCTPCT in the last 72 hours  RADIOLOGY: Dg Chest 2 View  01/25/2011  *RADIOLOGY REPORT*  Clinical Data: AICD revision.  CHEST - 2 VIEW  Comparison: 01/24/2011  Findings: AICD and right central line remain in place, unchanged. Cardiomegaly. There is hyperinflation  of the lungs compatible with COPD.  Scarring or atelectasis in the left lung base.  No effusions.  No pneumothorax.  IMPRESSION: No significant change.  Original Report Authenticated By: Cyndie Chime, M.D.   Dg Chest Port 1 View  01/24/2011  *RADIOLOGY REPORT*  Clinical Data: Central line placement.  PORTABLE CHEST - 1 VIEW  Comparison: Chest x-ray 12/21/2010.   Findings: The right IJ central venous catheter tip is in the mid SVC, just above the carina.  The pacer wires are stable.  The heart remains enlarged.  There is tortuosity, ectasia and calcification of the thoracic aorta.  Chronic lung changes.  No definite acute pulmonary findings.  Minimal subsegmental atelectasis in the left lower lobe.  IMPRESSION:  1.  Right IJ central venous catheter tip is in the mid SVC. 2.  Stable mild cardiac enlargement. 3.  Minimal basilar atelectasis.  Original Report Authenticated By: P. Loralie Champagne, M.D.   Dg C-arm 1-60 Min-no Report  01/24/2011  CLINICAL DATA: Lead exchange   C-ARM 1-60 MINUTES  Fluoroscopy was utilized by the requesting physician.  No radiographic  interpretation.      ASSESSMENT AND PLAN:   Chronic systolic dysfunction- stable s/p ICD system revision Will plan to DC to home today Wound check in 10 days and follow-up with Dr Ladona Ridgel in [place] Resume home medications Routine wound care upon discharge.   Hillis Range, MD 01/25/2011 12:55 PM

## 2011-01-28 NOTE — Discharge Summary (Signed)
I have seen, examined the patient, and reviewed the above assessment and plan.   Co Sign: Hillis Range, MD 01/28/2011 8:31 AM

## 2011-01-30 ENCOUNTER — Encounter: Payer: Self-pay | Admitting: Family Medicine

## 2011-01-30 ENCOUNTER — Ambulatory Visit (INDEPENDENT_AMBULATORY_CARE_PROVIDER_SITE_OTHER): Payer: Medicare Other | Admitting: Family Medicine

## 2011-01-30 VITALS — BP 98/62 | HR 73 | Resp 16 | Ht 66.25 in | Wt 168.0 lb

## 2011-01-30 DIAGNOSIS — R7303 Prediabetes: Secondary | ICD-10-CM

## 2011-01-30 DIAGNOSIS — R7309 Other abnormal glucose: Secondary | ICD-10-CM

## 2011-01-30 DIAGNOSIS — J209 Acute bronchitis, unspecified: Secondary | ICD-10-CM

## 2011-01-30 DIAGNOSIS — Z95810 Presence of automatic (implantable) cardiac defibrillator: Secondary | ICD-10-CM

## 2011-01-30 DIAGNOSIS — I10 Essential (primary) hypertension: Secondary | ICD-10-CM

## 2011-01-30 DIAGNOSIS — M109 Gout, unspecified: Secondary | ICD-10-CM

## 2011-01-30 DIAGNOSIS — J019 Acute sinusitis, unspecified: Secondary | ICD-10-CM

## 2011-01-30 MED ORDER — CARVEDILOL 25 MG PO TABS: 25.0000 mg | ORAL_TABLET | Freq: Two times a day (BID) | ORAL | Status: DC

## 2011-01-30 MED ORDER — SPIRONOLACTONE 25 MG PO TABS: 25.0000 mg | ORAL_TABLET | Freq: Every day | ORAL | Status: DC

## 2011-01-30 MED ORDER — ALLOPURINOL 100 MG PO TABS: 100.0000 mg | ORAL_TABLET | Freq: Every day | ORAL | Status: DC

## 2011-01-30 MED ORDER — BUDESONIDE-FORMOTEROL FUMARATE 160-4.5 MCG/ACT IN AERO: 2.0000 | INHALATION_SPRAY | Freq: Two times a day (BID) | RESPIRATORY_TRACT | Status: DC

## 2011-01-30 MED ORDER — CEFTRIAXONE SODIUM 1 G IJ SOLR
500.0000 mg | Freq: Once | INTRAMUSCULAR | Status: AC
  Administered 2011-01-30: 500 mg via INTRAMUSCULAR

## 2011-01-30 MED ORDER — FUROSEMIDE 40 MG PO TABS: 40.0000 mg | ORAL_TABLET | Freq: Two times a day (BID) | ORAL | Status: DC

## 2011-01-30 MED ORDER — PENICILLIN V POTASSIUM 500 MG PO TABS: 500.0000 mg | ORAL_TABLET | Freq: Three times a day (TID) | ORAL | Status: AC

## 2011-01-30 MED ORDER — PROMETHAZINE-DM 6.25-15 MG/5ML PO SYRP: ORAL_SOLUTION | ORAL | Status: AC

## 2011-01-30 MED ORDER — LOSARTAN POTASSIUM 25 MG PO TABS: 25.0000 mg | ORAL_TABLET | Freq: Every day | ORAL | Status: DC

## 2011-01-30 MED ORDER — TRAMADOL HCL 50 MG PO TABS: 50.0000 mg | ORAL_TABLET | Freq: Every evening | ORAL | Status: DC | PRN

## 2011-01-30 NOTE — Patient Instructions (Addendum)
F/U in 2.5 months.  You are being treated for acute bronchitis and sinusitis.  Rocephin is administered in the office, and penicillin and a cough suppressant are sent to the pharmacy  I hope you feel better soon, call if you do not.   HBa1C and chem 7 in 2.5 months , before next visit

## 2011-02-03 DIAGNOSIS — J019 Acute sinusitis, unspecified: Secondary | ICD-10-CM | POA: Insufficient documentation

## 2011-02-03 NOTE — Assessment & Plan Note (Signed)
Importance of low carb diet stressed, updated HBa1C needed

## 2011-02-03 NOTE — Progress Notes (Signed)
  Subjective:    Patient ID: [name] Macias, male    DOB: 1932/10/24, 78 y.o.   MRN: [id number]  HPI C/o head and chest congestion with intermittent fever and chills , yellow nasal drainage, sinus pressure and chest congestion with cough productive of yellow sputum since the procedure last week involving his pacemaker. Denies any recent gout flares, and had needed a lead replaced in his defibrilator which was noted to be malfunctioning in recent times.Procedure was on 01/24/2011   Review of Systems See HPI  Denies chest pains, palpitations and leg swelling Denies abdominal pain, nausea, vomiting,diarrhea or constipation.   Denies dysuria, frequency, hesitancy or incontinence. Denies joint pain, swelling and limitation in mobility. Denies headaches, seizures, numbness, or tingling. Denies depression, anxiety or insomnia. Denies skin break down or rash.        Objective:   Physical Exam Patient alert and oriented and in no cardiopulmonary distress.Ill appearing  HEENT: No facial asymmetry, EOMI, frontal and maxillary sinus tenderness,  oropharynx pink and moist.  Neck supple no adenopathy.  Chest: decreased air entry, scattered crackles, no wheezes  CVS: S1, S2 no murmurs, no S3.  ABD: Soft non tender. Bowel sounds normal.  Ext: No edema  MS: Adequate though reduced  ROM spine, shoulders, hips and knees.  Skin: Intact, no ulcerations or rash noted.  Psych: Good eye contact, normal affect. Memory intact not anxious or depressed appearing.  CNS: CN 2-12 intact, power, tone and sensation normal throughout.        Assessment & Plan:

## 2011-02-03 NOTE — Assessment & Plan Note (Signed)
Controlled, no change in medication  

## 2011-02-03 NOTE — Assessment & Plan Note (Signed)
No recent flare, stable

## 2011-02-03 NOTE — Assessment & Plan Note (Signed)
Acute symptoms of ifection , antibiotic and decongestants

## 2011-02-04 ENCOUNTER — Ambulatory Visit: Payer: Medicare Other | Admitting: *Deleted

## 2011-02-13 ENCOUNTER — Encounter: Payer: Self-pay | Admitting: Internal Medicine

## 2011-02-13 ENCOUNTER — Ambulatory Visit (INDEPENDENT_AMBULATORY_CARE_PROVIDER_SITE_OTHER): Payer: Medicare Other | Admitting: *Deleted

## 2011-02-13 DIAGNOSIS — I428 Other cardiomyopathies: Secondary | ICD-10-CM

## 2011-02-13 LAB — ICD DEVICE OBSERVATION
ATRIAL PACING ICD: 2.95 pct
CHARGE TIME: 3.523 s
FVT: 0
PACEART VT: 0
TOT-0001: 2
TZAT-0001ATACH: 1
TZAT-0001ATACH: 3
TZAT-0001FASTVT: 1
TZAT-0001SLOWVT: 1
TZAT-0001SLOWVT: 2
TZAT-0002ATACH: NEGATIVE
TZAT-0002ATACH: NEGATIVE
TZAT-0002FASTVT: NEGATIVE
TZAT-0005SLOWVT: 88 pct
TZAT-0005SLOWVT: 91 pct
TZAT-0011SLOWVT: 10 ms
TZAT-0012ATACH: 150 ms
TZAT-0012ATACH: 150 ms
TZAT-0012SLOWVT: 200 ms
TZAT-0012SLOWVT: 200 ms
TZAT-0013SLOWVT: 2
TZAT-0013SLOWVT: 2
TZAT-0018ATACH: NEGATIVE
TZAT-0018ATACH: NEGATIVE
TZAT-0018ATACH: NEGATIVE
TZAT-0018SLOWVT: NEGATIVE
TZAT-0018SLOWVT: NEGATIVE
TZAT-0019ATACH: 6 V
TZAT-0019ATACH: 6 V
TZON-0003ATACH: 350 ms
TZON-0003SLOWVT: 350 ms
TZON-0004SLOWVT: 32
TZON-0005SLOWVT: 12
TZST-0001ATACH: 4
TZST-0001ATACH: 5
TZST-0001ATACH: 6
TZST-0001FASTVT: 3
TZST-0001FASTVT: 4
TZST-0001SLOWVT: 3
TZST-0001SLOWVT: 4
TZST-0001SLOWVT: 6
TZST-0002ATACH: NEGATIVE
TZST-0002ATACH: NEGATIVE
TZST-0002FASTVT: NEGATIVE
TZST-0002FASTVT: NEGATIVE
TZST-0002FASTVT: NEGATIVE
TZST-0003SLOWVT: 28 J
TZST-0003SLOWVT: 35 J
VENTRICULAR PACING ICD: 2.74 pct

## 2011-02-13 NOTE — Progress Notes (Signed)
Wound check icd in clinic.  

## 2011-03-06 ENCOUNTER — Other Ambulatory Visit: Payer: Self-pay

## 2011-03-06 ENCOUNTER — Telehealth: Payer: Self-pay | Admitting: Family Medicine

## 2011-03-06 MED ORDER — ALLOPURINOL 100 MG PO TABS: 100.0000 mg | ORAL_TABLET | Freq: Every day | ORAL | Status: DC

## 2011-03-06 NOTE — Telephone Encounter (Signed)
pls refill the allopurinol x 6 months, I am assuming this is what he wants, if something else, let me know please

## 2011-03-11 NOTE — Telephone Encounter (Signed)
Refilled

## 2011-03-21 ENCOUNTER — Encounter: Payer: Medicare HMO | Admitting: *Deleted

## 2011-04-15 ENCOUNTER — Encounter: Payer: Self-pay | Admitting: Family Medicine

## 2011-04-17 ENCOUNTER — Encounter: Payer: Self-pay | Admitting: Family Medicine

## 2011-04-17 ENCOUNTER — Ambulatory Visit (INDEPENDENT_AMBULATORY_CARE_PROVIDER_SITE_OTHER): Payer: Medicare Other | Admitting: Family Medicine

## 2011-04-17 VITALS — BP 120/70 | HR 68 | Resp 16 | Ht 66.25 in | Wt 167.0 lb

## 2011-04-17 DIAGNOSIS — R972 Elevated prostate specific antigen [PSA]: Secondary | ICD-10-CM

## 2011-04-17 DIAGNOSIS — I5022 Chronic systolic (congestive) heart failure: Secondary | ICD-10-CM

## 2011-04-17 DIAGNOSIS — Z13220 Encounter for screening for lipoid disorders: Secondary | ICD-10-CM

## 2011-04-17 DIAGNOSIS — R7303 Prediabetes: Secondary | ICD-10-CM

## 2011-04-17 DIAGNOSIS — I10 Essential (primary) hypertension: Secondary | ICD-10-CM

## 2011-04-17 DIAGNOSIS — M19019 Primary osteoarthritis, unspecified shoulder: Secondary | ICD-10-CM

## 2011-04-17 DIAGNOSIS — R7309 Other abnormal glucose: Secondary | ICD-10-CM

## 2011-04-17 DIAGNOSIS — M109 Gout, unspecified: Secondary | ICD-10-CM

## 2011-04-17 NOTE — Patient Instructions (Addendum)
F/u in 4 month  No changes in medication.  You need Fasting lipid, chem 7, TSh and uric acid level blood work as soon as possible.  I think that you would benefit from having a Child psychotherapist do a home visit with you and Mrs. Balboni tos see if there are services available to further help you with you health care. Please call back and leave a message if you are interested with the nurse  Your colonoscopy is past due , you need to have this done this year. I understand your concern with your wife's health.  Ensure you keep appointment with Dr Jerre Simon about your prostate

## 2011-04-17 NOTE — Progress Notes (Signed)
  Subjective:    Patient ID: [name] Macias, male    DOB: 1932/06/19, 78 y.o.   MRN: [id number]  HPI The PT is here for follow up and re-evaluation of chronic medical conditions, medication management and review of any available recent lab and radiology data.  Preventive health is updated, specifically  Cancer screening and Immunization.   Questions or concerns regarding consultations or procedures which the PT has had in the interim are  addressed. The PT denies any adverse reactions to current medications since the last visit.  There are no new concerns. Just extremely concerned about his wife's health, she has severe arthritis of the knee which is disabling, he is doing all the house responsibilities There are no specific complaints       Review of Systems See HPI Denies recent fever or chills. Denies sinus pressure, nasal congestion, ear pain or sore throat. Denies chest congestion, productive cough or wheezing. Denies chest pains, palpitations and leg swelling Denies abdominal pain, nausea, vomiting,diarrhea or constipation.   Denies dysuria, frequency, hesitancy or incontinence. Denies headaches, seizures, numbness, or tingling.  Denies skin break down or rash.        Objective:   Physical Exam Patient alert and oriented and in no cardiopulmonary distress.  HEENT: No facial asymmetry, EOMI, no sinus tenderness,  oropharynx pink and moist.  Neck supple no adenopathy.  Chest: Clear to auscultation bilaterally.Decreased air entry  CVS: S1, S2 no murmurs, no S3.  ABD: Soft non tender. Bowel sounds normal.  Ext: No edema  WU:[id number]  ROM spine, shoulders, hips and knees.  Skin: Intact, no ulcerations or rash noted.  Psych: Good eye contact, normal affect. Memory mildly impaired mildly  anxious not depressed appearing.  CNS: CN 2-12 intact, power, tone and sensation normal throughout.        Assessment & Plan:

## 2011-04-19 MED ORDER — NITROGLYCERIN 0.4 MG SL SUBL: 0.4000 mg | SUBLINGUAL_TABLET | SUBLINGUAL | Status: DC | PRN

## 2011-04-19 MED ORDER — FUROSEMIDE 40 MG PO TABS: 40.0000 mg | ORAL_TABLET | Freq: Two times a day (BID) | ORAL | Status: DC

## 2011-04-19 MED ORDER — LOSARTAN POTASSIUM 25 MG PO TABS: 25.0000 mg | ORAL_TABLET | Freq: Every day | ORAL | Status: DC

## 2011-04-19 MED ORDER — CARVEDILOL 25 MG PO TABS: 25.0000 mg | ORAL_TABLET | Freq: Two times a day (BID) | ORAL | Status: DC

## 2011-04-19 MED ORDER — ALLOPURINOL 100 MG PO TABS: 100.0000 mg | ORAL_TABLET | Freq: Every day | ORAL | Status: DC

## 2011-04-19 MED ORDER — TRAMADOL HCL 50 MG PO TABS: 50.0000 mg | ORAL_TABLET | Freq: Every evening | ORAL | Status: DC | PRN

## 2011-04-19 MED ORDER — TEMAZEPAM 15 MG PO CAPS: 15.0000 mg | ORAL_CAPSULE | Freq: Every evening | ORAL | Status: DC | PRN

## 2011-04-19 MED ORDER — SPIRONOLACTONE 25 MG PO TABS: 25.0000 mg | ORAL_TABLET | Freq: Every day | ORAL | Status: DC

## 2011-04-21 NOTE — Assessment & Plan Note (Signed)
Controlled, no change in medication  

## 2011-04-21 NOTE — Assessment & Plan Note (Signed)
Importance of carb restriction and weight loss stressed, updated lab needed

## 2011-04-21 NOTE — Assessment & Plan Note (Signed)
Controlled, no change in medication Updated lab needed  

## 2011-04-21 NOTE — Assessment & Plan Note (Signed)
Asymptomatic currently,  No med change

## 2011-04-21 NOTE — Assessment & Plan Note (Signed)
Tramadol at bedtime as needed for pain  To be continued

## 2011-04-21 NOTE — Assessment & Plan Note (Signed)
Importance of f/u with urology stressed

## 2011-04-22 ENCOUNTER — Other Ambulatory Visit: Payer: Self-pay

## 2011-04-25 ENCOUNTER — Telehealth: Payer: Self-pay | Admitting: Family Medicine

## 2011-04-25 NOTE — Telephone Encounter (Signed)
Ok to refill to mail order? (90 days)

## 2011-04-26 ENCOUNTER — Ambulatory Visit (INDEPENDENT_AMBULATORY_CARE_PROVIDER_SITE_OTHER): Payer: Medicare Other | Admitting: Internal Medicine

## 2011-04-26 ENCOUNTER — Encounter: Payer: Self-pay | Admitting: Internal Medicine

## 2011-04-26 VITALS — BP 105/64 | HR 64 | Resp 16 | Ht 68.0 in | Wt 165.0 lb

## 2011-04-26 DIAGNOSIS — I5022 Chronic systolic (congestive) heart failure: Secondary | ICD-10-CM

## 2011-04-26 DIAGNOSIS — I428 Other cardiomyopathies: Secondary | ICD-10-CM

## 2011-04-26 DIAGNOSIS — Z95810 Presence of automatic (implantable) cardiac defibrillator: Secondary | ICD-10-CM

## 2011-04-26 LAB — ICD DEVICE OBSERVATION
AL AMPLITUDE: 5.25 mv
AL IMPEDENCE ICD: 475 Ohm
AL THRESHOLD: 0.75 V
ATRIAL PACING ICD: 8.1 pct
BATTERY VOLTAGE: 3.1619 V
FVT: 0
HV IMPEDENCE: 68 Ohm
RV LEAD AMPLITUDE: 5.875 mv
RV LEAD IMPEDENCE ICD: 456 Ohm
RV LEAD THRESHOLD: 0.5 V
TOT-0006: 20120606000000
TZAT-0001ATACH: 1
TZAT-0001FASTVT: 1
TZAT-0002ATACH: NEGATIVE
TZAT-0002ATACH: NEGATIVE
TZAT-0002ATACH: NEGATIVE
TZAT-0005SLOWVT: 91 pct
TZAT-0011SLOWVT: 10 ms
TZAT-0011SLOWVT: 10 ms
TZAT-0012FASTVT: 200 ms
TZAT-0012SLOWVT: 200 ms
TZAT-0013SLOWVT: 2
TZAT-0013SLOWVT: 2
TZAT-0018ATACH: NEGATIVE
TZAT-0018ATACH: NEGATIVE
TZAT-0018FASTVT: NEGATIVE
TZAT-0018SLOWVT: NEGATIVE
TZAT-0018SLOWVT: NEGATIVE
TZAT-0019ATACH: 6 V
TZAT-0019ATACH: 6 V
TZAT-0019ATACH: 6 V
TZAT-0019SLOWVT: 8 V
TZAT-0019SLOWVT: 8 V
TZAT-0020ATACH: 1.5 ms
TZAT-0020ATACH: 1.5 ms
TZAT-0020FASTVT: 1.5 ms
TZON-0004SLOWVT: 32
TZON-0005SLOWVT: 12
TZST-0001ATACH: 5
TZST-0001FASTVT: 3
TZST-0001FASTVT: 5
TZST-0001SLOWVT: 4
TZST-0001SLOWVT: 6
TZST-0002ATACH: NEGATIVE
TZST-0002ATACH: NEGATIVE
TZST-0002FASTVT: NEGATIVE
TZST-0002FASTVT: NEGATIVE
TZST-0002FASTVT: NEGATIVE
TZST-0003SLOWVT: 28 J
TZST-0003SLOWVT: 35 J
VF: 0

## 2011-04-26 MED ORDER — INDOMETHACIN 25 MG PO CAPS: 25.0000 mg | ORAL_CAPSULE | Freq: Two times a day (BID) | ORAL | Status: DC

## 2011-04-26 NOTE — Assessment & Plan Note (Signed)
His device is working normally. We have reprogrammed his device to prevent RV pacing and with this he feels better with more energy.

## 2011-04-26 NOTE — Assessment & Plan Note (Signed)
His symptoms are currently class 2. He will continue his current medical therapy. A low sodium diet has been requested.

## 2011-04-26 NOTE — Progress Notes (Signed)
HPI [name] returns today for followup. He is a pleasant 78 yo man with an ICM, chronic systolic heart failure s/p ICD. He underwent ICD revision several months ago. He denies chest pain, sob, or edema. He has increased his activity. No syncope or ICD shock.  Allergies  Allergen Reactions  . Codeine Other (See Comments)    Whelps up  . Enalapril Maleate Other (See Comments)    Whelps up  . Sulfonamide Derivatives Other (See Comments)    Whelps up     Current Outpatient Prescriptions  Medication Sig Dispense Refill  . allopurinol (ZYLOPRIM) 100 MG tablet Take 1 tablet (100 mg total) by mouth daily.  90 tablet  1  . aspirin (ASPIRIN LOW DOSE) 81 MG EC tablet Take 81 mg by mouth daily.        . budesonide-formoterol (SYMBICORT) 160-4.5 MCG/ACT inhaler Inhale 2 puffs into the lungs 2 (two) times daily.  1 Inhaler  3  . carvedilol (COREG) 25 MG tablet Take 1 tablet (25 mg total) by mouth 2 (two) times daily with a meal.  180 tablet  1  . furosemide (LASIX) 40 MG tablet Take 1 tablet (40 mg total) by mouth 2 (two) times daily.  180 tablet  1  . indomethacin (INDOCIN) 25 MG capsule Take 25 mg by mouth 2 (two) times daily with a meal.      . losartan (COZAAR) 25 MG tablet Take 1 tablet (25 mg total) by mouth daily.  90 tablet  1  . nitroGLYCERIN (NITROSTAT) 0.4 MG SL tablet Place 1 tablet (0.4 mg total) under the tongue every 5 (five) minutes as needed. For chest pain  30 tablet  0  . NON FORMULARY 2 mLs. Oxygen. Daily at night and PRN during the day.       . spironolactone (ALDACTONE) 25 MG tablet Take 1 tablet (25 mg total) by mouth daily.  90 tablet  1  . temazepam (RESTORIL) 15 MG capsule Take 1 capsule (15 mg total) by mouth at bedtime as needed.  90 capsule  0  . traMADol (ULTRAM) 50 MG tablet Take 1 tablet (50 mg total) by mouth at bedtime as needed. One tablet at bedtime , as needed for shoulder pain  90 tablet  0     Past Medical History  Diagnosis Date  . Arthritis   . Gout   .  Non-ischemic cardiomyopathy   . Hypertension   . Arthritis     left foot   . CAD (coronary artery disease) of artery bypass graft   . COPD (chronic obstructive pulmonary disease)   . History of syphilis   . Type 2 HSV infection of penis   . Erectile dysfunction   . Anxiety disorder   . Dementia   . Anxiety   . Angina   . GERD (gastroesophageal reflux disease)     ROS:   All systems reviewed and negative except as noted in the HPI.   Past Surgical History  Procedure Date  . Left ankle     Not Done by Dr. Romeo Apple   . S/p  biventricular pacemaker / aicd implantation 02/19/05  . Defribrillator implant   . Crtd upgrade 12/14/2009  . Transthoracic echocardiogram 2011  . Doppler echocardiography 2009  . Insert / replace / remove pacemaker      Family History  Problem Relation Age of Onset  . Heart failure Father   . Prostate cancer Brother   . Diabetes Brother   . Hypertension  Brother   . Heart failure Brother      History   Social History  . Marital Status: Married    Spouse Name: N/A    Number of Children: 3  . Years of Education: N/A   Occupational History  . disabled     Social History Main Topics  . Smoking status: Former Smoker -- 1.0 packs/day for 10 years    Types: Cigarettes    Quit date: 01/15/1963  . Smokeless tobacco: Never Used  . Alcohol Use: No  . Drug Use: No  . Sexually Active: Not on file   Other Topics Concern  . Not on file   Social History Narrative  . No narrative on file     BP 105/64  Pulse 64  Resp 16  Ht 5\' 8"  (1.727 m)  Wt 74.844 kg (165 lb)  BMI 25.09 kg/m2  Physical Exam:  Well appearing 78 yo man, NAD HEENT: Unremarkable Neck:  No JVD, no thyromegally Lymphatics:  No adenopathy Back:  No CVA tenderness Lungs:  Clear with no wheezes, rales or rhonchi HEART:  Regular rate rhythm, no murmurs, no rubs, no clicks Abd:  soft, positive bowel sounds, no organomegally, no rebound, no guarding Ext:  2 plus pulses, no  edema, no cyanosis, no clubbing Skin:  No rashes no nodules Neuro:  CN II through XII intact, motor grossly intact   DEVICE  Normal device function.  See PaceArt for details.   Assess/Plan:

## 2011-04-26 NOTE — Telephone Encounter (Signed)
Patient's wife made aware that he needs to only use the indomethacin when there is a flare up of gout , since it cquse cause   Please send in 90 day supply of indiocid one time only

## 2011-04-26 NOTE — Patient Instructions (Signed)
**Note De-Identified  Obfuscation** Your physician recommends that you schedule a follow-up appointment in: 3 months for a device check and in January with Dr. Ladona Ridgel

## 2011-04-27 LAB — BASIC METABOLIC PANEL
BUN: 27 mg/dL — ABNORMAL HIGH (ref 6–23)
CO2: 27 mEq/L (ref 19–32)
Calcium: 9.4 mg/dL (ref 8.4–10.5)
Chloride: 104 mEq/L (ref 96–112)
Creat: 1.74 mg/dL — ABNORMAL HIGH (ref 0.50–1.35)

## 2011-04-27 LAB — LIPID PANEL
HDL: 33 mg/dL — ABNORMAL LOW (ref >39)
LDL Cholesterol: 95 mg/dL (ref 0–99)

## 2011-04-27 LAB — TSH: TSH: 2.781 u[IU]/mL (ref 0.350–4.500)

## 2011-05-03 ENCOUNTER — Encounter: Payer: Self-pay | Admitting: Internal Medicine

## 2011-08-02 ENCOUNTER — Ambulatory Visit (INDEPENDENT_AMBULATORY_CARE_PROVIDER_SITE_OTHER): Payer: Medicare Other | Admitting: *Deleted

## 2011-08-02 ENCOUNTER — Encounter: Payer: Self-pay | Admitting: Internal Medicine

## 2011-08-02 DIAGNOSIS — I5022 Chronic systolic (congestive) heart failure: Secondary | ICD-10-CM

## 2011-08-02 DIAGNOSIS — I428 Other cardiomyopathies: Secondary | ICD-10-CM

## 2011-08-02 LAB — ICD DEVICE OBSERVATION
AL IMPEDENCE ICD: 456 Ohm
ATRIAL PACING ICD: 13.17 pct
CHARGE TIME: 9.048 s
FVT: 0
PACEART VT: 0
TOT-0001: 2
TOT-0002: 0
TZAT-0001ATACH: 1
TZAT-0001ATACH: 3
TZAT-0001FASTVT: 1
TZAT-0001SLOWVT: 1
TZAT-0001SLOWVT: 2
TZAT-0002ATACH: NEGATIVE
TZAT-0002ATACH: NEGATIVE
TZAT-0002ATACH: NEGATIVE
TZAT-0002FASTVT: NEGATIVE
TZAT-0005SLOWVT: 88 pct
TZAT-0005SLOWVT: 91 pct
TZAT-0011SLOWVT: 10 ms
TZAT-0011SLOWVT: 10 ms
TZAT-0012ATACH: 150 ms
TZAT-0013SLOWVT: 2
TZAT-0013SLOWVT: 2
TZAT-0018ATACH: NEGATIVE
TZAT-0018ATACH: NEGATIVE
TZAT-0018SLOWVT: NEGATIVE
TZAT-0018SLOWVT: NEGATIVE
TZAT-0019ATACH: 6 V
TZAT-0019ATACH: 6 V
TZAT-0019FASTVT: 8 V
TZAT-0019SLOWVT: 8 V
TZON-0004SLOWVT: 32
TZON-0005SLOWVT: 12
TZST-0001ATACH: 5
TZST-0001FASTVT: 3
TZST-0001FASTVT: 4
TZST-0001SLOWVT: 4
TZST-0001SLOWVT: 6
TZST-0002ATACH: NEGATIVE
TZST-0002ATACH: NEGATIVE
TZST-0002FASTVT: NEGATIVE
TZST-0002FASTVT: NEGATIVE
TZST-0002FASTVT: NEGATIVE
TZST-0002FASTVT: NEGATIVE
TZST-0003SLOWVT: 28 J
TZST-0003SLOWVT: 35 J
VENTRICULAR PACING ICD: 13.11 pct

## 2011-08-02 NOTE — Progress Notes (Signed)
ICD check with ICM 

## 2011-08-22 ENCOUNTER — Telehealth: Payer: Self-pay | Admitting: Family Medicine

## 2011-08-22 ENCOUNTER — Other Ambulatory Visit: Payer: Self-pay | Admitting: Family Medicine

## 2011-08-22 ENCOUNTER — Ambulatory Visit (INDEPENDENT_AMBULATORY_CARE_PROVIDER_SITE_OTHER): Payer: Medicare Other | Admitting: Family Medicine

## 2011-08-22 ENCOUNTER — Encounter: Payer: Self-pay | Admitting: Family Medicine

## 2011-08-22 VITALS — BP 94/70 | HR 67 | Resp 16 | Ht 66.25 in | Wt 166.0 lb

## 2011-08-22 DIAGNOSIS — R7303 Prediabetes: Secondary | ICD-10-CM

## 2011-08-22 DIAGNOSIS — I428 Other cardiomyopathies: Secondary | ICD-10-CM

## 2011-08-22 DIAGNOSIS — R7309 Other abnormal glucose: Secondary | ICD-10-CM

## 2011-08-22 DIAGNOSIS — G4700 Insomnia, unspecified: Secondary | ICD-10-CM

## 2011-08-22 DIAGNOSIS — I10 Essential (primary) hypertension: Secondary | ICD-10-CM

## 2011-08-22 DIAGNOSIS — N63 Unspecified lump in unspecified breast: Secondary | ICD-10-CM

## 2011-08-22 MED ORDER — TEMAZEPAM 15 MG PO CAPS: 15.0000 mg | ORAL_CAPSULE | Freq: Every evening | ORAL | Status: DC | PRN

## 2011-08-22 NOTE — Patient Instructions (Addendum)
Annual wellness in 4.5 month, please call if you need me before  You are referred for  Ultrasound of the left breast, I feel a lump  We will need to get information form dr Jerre Simon please sign a release.   New additional medication for sleep is sent in

## 2011-08-23 NOTE — Telephone Encounter (Signed)
Patient is aware 

## 2011-08-26 NOTE — Assessment & Plan Note (Signed)
Left breast lump, enlarged in size , will refer for imaging

## 2011-08-26 NOTE — Assessment & Plan Note (Signed)
Controlled, no change in medication DASH diet and commitment to daily physical activity for a minimum of 30 minutes discussed and encouraged, as a part of hypertension management. The importance of attaining a healthy weight is also discussed.  

## 2011-08-26 NOTE — Assessment & Plan Note (Signed)
Deteriorated, sleep hygiene discussed, medication prescribed

## 2011-08-26 NOTE — Assessment & Plan Note (Signed)
Patient educated about the importance of limiting  Carbohydrate intake , the need to commit to daily physical activity for a minimum of 30 minutes , and to commit weight loss. The fact that changes in all these areas will reduce or eliminate all together the development of diabetes is stressed.    

## 2011-08-26 NOTE — Progress Notes (Signed)
  Subjective:    Patient ID: [name] Macias, male    DOB: [birth year], 78 y.o.   MRN: [id number]  HPI The PT is here for follow up and re-evaluation of chronic medical conditions, medication management and review of any available recent lab and radiology data.  Preventive health is updated, specifically  Cancer screening and Immunization.   Questions or concerns regarding consultations or procedures which the PT has had in the interim are  addressed. The PT denies any adverse reactions to current medications since the last visit.  C/o enlarging left breast lump which is non tender        Review of Systems See HPI Denies recent fever or chills. Denies sinus pressure, nasal congestion, ear pain or sore throat. Denies chest congestion, productive cough or wheezing. Denies chest pains, palpitations and leg swelling Denies abdominal pain, nausea, vomiting,diarrhea or constipation.   Denies dysuria, frequency, hesitancy or incontinence. Denies joint pain, swelling and limitation in mobility. Denies headaches, seizures, numbness, or tingling. Denies depression, anxiety or insomnia. Denies skin break down or rash.        Objective:   Physical Exam Patient alert and oriented and in no cardiopulmonary distress.  HEENT: No facial asymmetry, EOMI, no sinus tenderness,  oropharynx pink and moist.  Neck supple no adenopathy.  Chest: Clear to auscultation bilaterally. Breast:: left lump palpable size approx 2.5cm diameter  CVS: S1, S2 no murmurs, no S3.  ABD: Soft non tender. Bowel sounds normal.  Ext: No edema  MS: Adequate ROM spine, shoulders, hips and knees.  Skin: Intact, no ulcerations or rash noted.  Psych: Good eye contact, normal affect. Memory intact not anxious or depressed appearing.  CNS: CN 2-12 intact, power, tone and sensation normal throughout.        Assessment & Plan:

## 2011-08-28 ENCOUNTER — Ambulatory Visit (HOSPITAL_COMMUNITY)
Admission: RE | Admit: 2011-08-28 | Discharge: 2011-08-28 | Disposition: A | Discharge status: Home / Self Care | Payer: Medicare Other | Source: Ambulatory Visit | Attending: Family Medicine | Admitting: Family Medicine

## 2011-08-30 NOTE — Assessment & Plan Note (Signed)
Asymptomatic and stable at this time 

## 2011-09-04 ENCOUNTER — Ambulatory Visit (HOSPITAL_COMMUNITY)
Admission: RE | Admit: 2011-09-04 | Discharge: 2011-09-04 | Disposition: A | Discharge status: Home / Self Care | Payer: Medicare Other | Source: Ambulatory Visit | Attending: Family Medicine | Admitting: Family Medicine

## 2011-09-04 DIAGNOSIS — N63 Unspecified lump in unspecified breast: Secondary | ICD-10-CM | POA: Insufficient documentation

## 2011-09-04 DIAGNOSIS — N62 Hypertrophy of breast: Secondary | ICD-10-CM | POA: Insufficient documentation

## 2011-09-17 ENCOUNTER — Telehealth: Payer: Self-pay | Admitting: Family Medicine

## 2011-09-17 MED ORDER — SPIRONOLACTONE 25 MG PO TABS: 25.0000 mg | ORAL_TABLET | Freq: Every day | ORAL | Status: DC

## 2011-09-17 MED ORDER — LOSARTAN POTASSIUM 25 MG PO TABS: 25.0000 mg | ORAL_TABLET | Freq: Every day | ORAL | Status: DC

## 2011-09-17 MED ORDER — INDOMETHACIN 25 MG PO CAPS: 25.0000 mg | ORAL_CAPSULE | Freq: Two times a day (BID) | ORAL | Status: DC

## 2011-09-17 MED ORDER — CARVEDILOL 25 MG PO TABS: 25.0000 mg | ORAL_TABLET | Freq: Two times a day (BID) | ORAL | Status: DC

## 2011-09-17 MED ORDER — ALLOPURINOL 100 MG PO TABS: 100.0000 mg | ORAL_TABLET | Freq: Every day | ORAL | Status: DC

## 2011-09-17 MED ORDER — FUROSEMIDE 40 MG PO TABS: 40.0000 mg | ORAL_TABLET | Freq: Two times a day (BID) | ORAL | Status: DC

## 2011-09-17 MED ORDER — TRAMADOL HCL 50 MG PO TABS: 50.0000 mg | ORAL_TABLET | Freq: Every evening | ORAL | Status: DC | PRN

## 2011-09-17 NOTE — Telephone Encounter (Signed)
refiled

## 2011-09-27 IMAGING — CR DG CHEST 1V PORT
1 series · 1 of 1 positions shown · non-contrast
Comparison: Portable exam 6666 hours compared to 12/21/2007

CLINICAL DATA: Chest pain, shortness of breath

PORTABLE CHEST - 1 VIEW

[view not recorded]
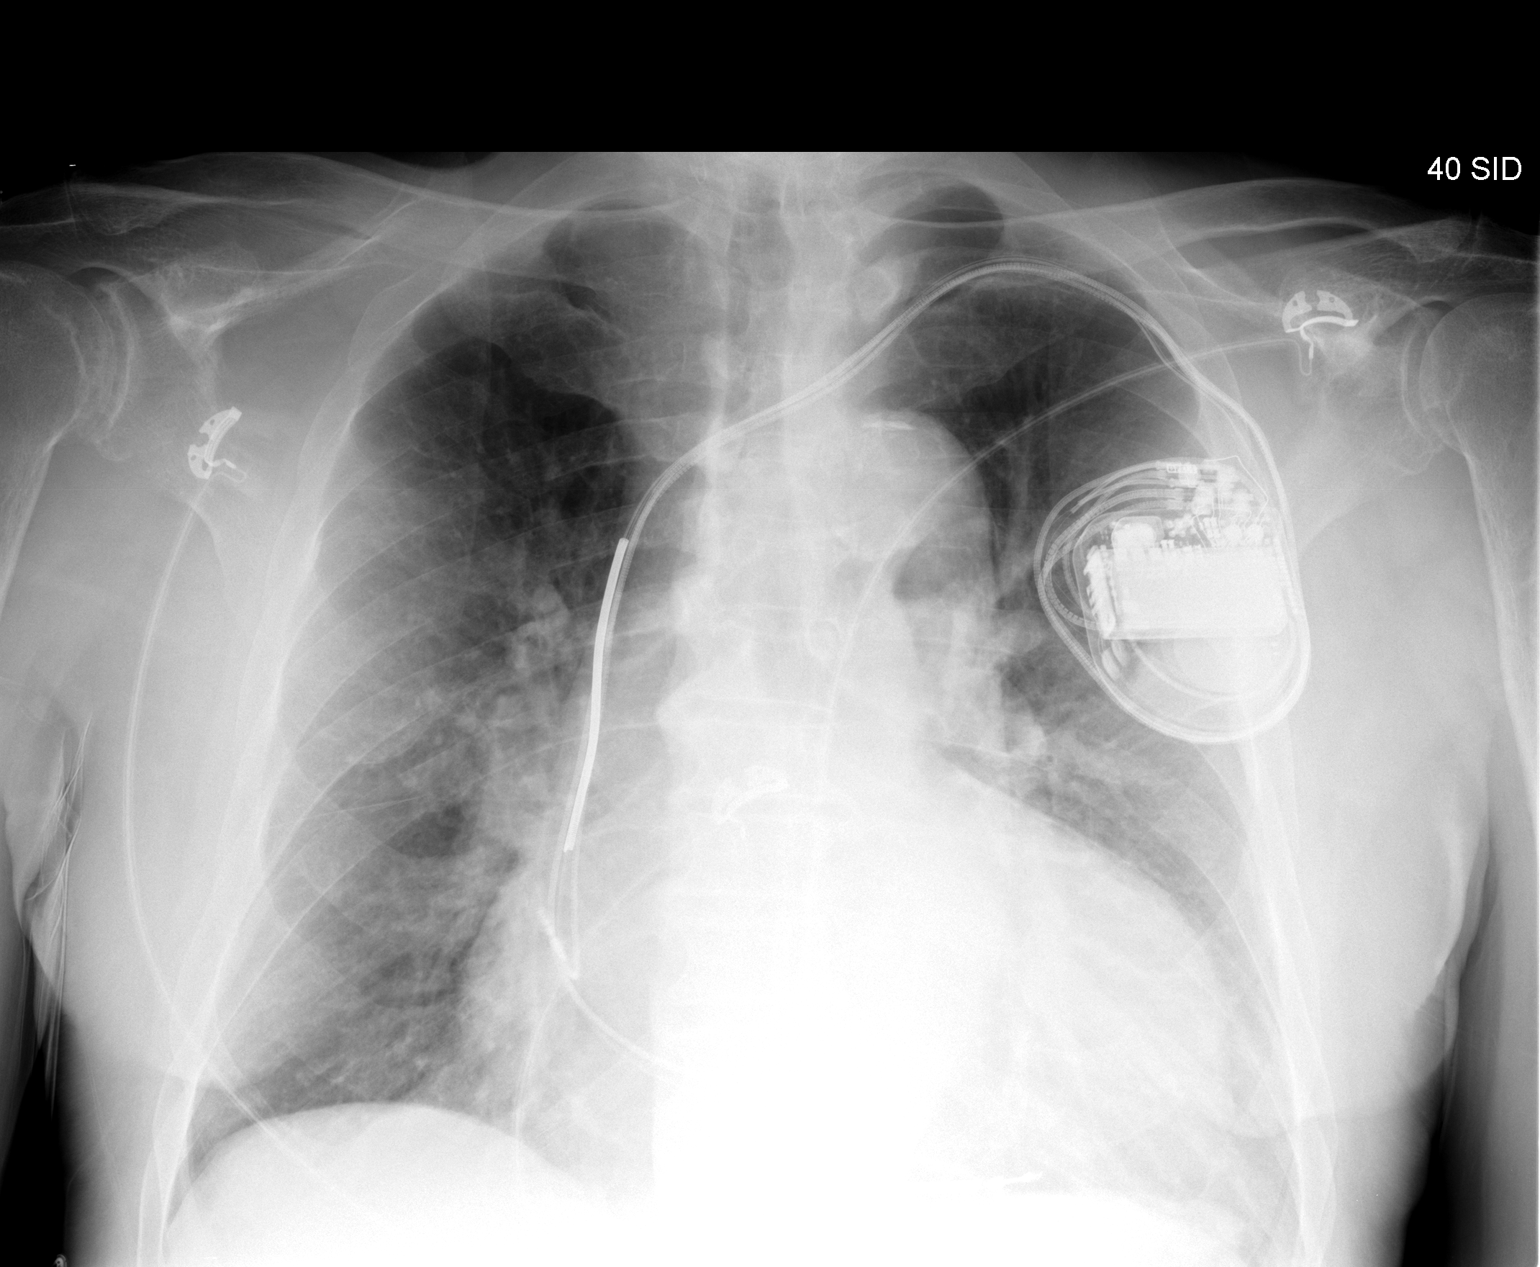

[1 of 1 positions shown; findings below may reference images not displayed]

FINDINGS: Enlarged cardiac silhouette.
Left subclavian pacemaker / AICD leads stable.
Calcified tortuous aorta.
Pulmonary vascular congestion.
Question minimal perihilar edema.
Remaining lungs clear.
No pleural effusion or pneumothorax.
Portion of left upper lobe obscured by pacemaker pack on AP view.
Right glenohumeral degenerative changes.
Bony demineralization.
IMPRESSION: Cardiac enlargement with pulmonary vascular congestion and question
minimal perihilar edema.

## 2011-10-02 IMAGING — CR DG CHEST 1V PORT
1 series · 1 of 1 positions shown · non-contrast
Comparison: 08/31/2009 and 09/15/2007.

CLINICAL DATA: Shortness of breath.  Cardiomyopathy.  Nonsmoker.

PORTABLE CHEST - 1 VIEW

[view not recorded]
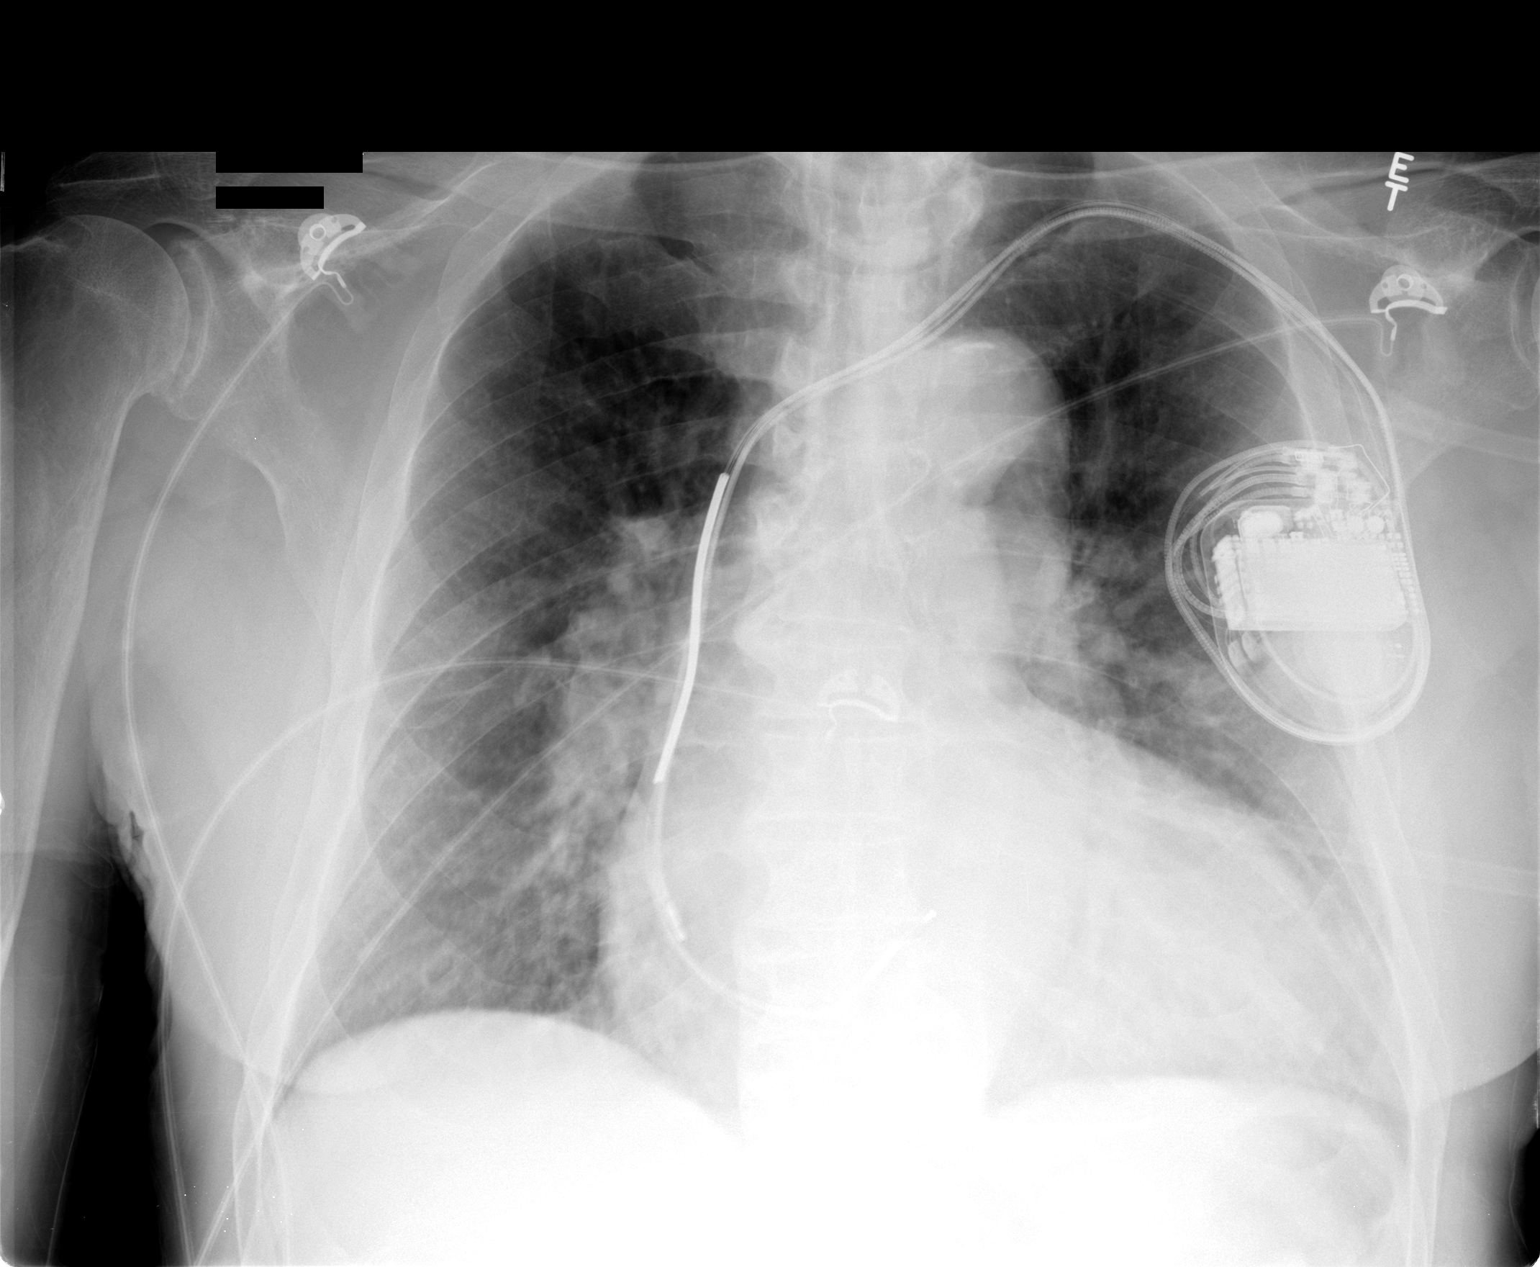

[1 of 1 positions shown; findings below may reference images not displayed]

FINDINGS: Biventricular AICD pacer is in place with leads appearing
grossly similar in position.  Cardiomegaly.  Pulmonary vascular
congestion most notable centrally.  This limits evaluation of
mediastinal structures although appearing similar to prior exams.
No segmental infiltrate or pneumothorax.  Calcified tortuous aorta.
IMPRESSION: Cardiomegaly with pulmonary vascular congestion.

## 2011-10-22 ENCOUNTER — Ambulatory Visit (INDEPENDENT_AMBULATORY_CARE_PROVIDER_SITE_OTHER): Payer: Medicare Other

## 2011-10-22 ENCOUNTER — Encounter: Payer: Self-pay | Admitting: *Deleted

## 2011-10-22 DIAGNOSIS — Z23 Encounter for immunization: Secondary | ICD-10-CM

## 2011-12-18 ENCOUNTER — Ambulatory Visit: Payer: Medicare Other | Admitting: Family Medicine

## 2011-12-18 ENCOUNTER — Encounter: Payer: Self-pay | Admitting: Family Medicine

## 2011-12-25 ENCOUNTER — Encounter: Payer: Self-pay | Admitting: Family Medicine

## 2011-12-25 ENCOUNTER — Ambulatory Visit (INDEPENDENT_AMBULATORY_CARE_PROVIDER_SITE_OTHER): Payer: Medicare Other | Admitting: Family Medicine

## 2011-12-25 ENCOUNTER — Other Ambulatory Visit: Payer: Self-pay | Admitting: Family Medicine

## 2011-12-25 VITALS — BP 134/76 | HR 68 | Resp 18 | Ht 66.25 in | Wt 166.1 lb

## 2011-12-25 DIAGNOSIS — R7303 Prediabetes: Secondary | ICD-10-CM

## 2011-12-25 DIAGNOSIS — I428 Other cardiomyopathies: Secondary | ICD-10-CM

## 2011-12-25 DIAGNOSIS — M109 Gout, unspecified: Secondary | ICD-10-CM

## 2011-12-25 DIAGNOSIS — I5022 Chronic systolic (congestive) heart failure: Secondary | ICD-10-CM

## 2011-12-25 DIAGNOSIS — G4700 Insomnia, unspecified: Secondary | ICD-10-CM

## 2011-12-25 DIAGNOSIS — R972 Elevated prostate specific antigen [PSA]: Secondary | ICD-10-CM

## 2011-12-25 DIAGNOSIS — Z125 Encounter for screening for malignant neoplasm of prostate: Secondary | ICD-10-CM

## 2011-12-25 DIAGNOSIS — I10 Essential (primary) hypertension: Secondary | ICD-10-CM

## 2011-12-25 DIAGNOSIS — Z1211 Encounter for screening for malignant neoplasm of colon: Secondary | ICD-10-CM

## 2011-12-25 DIAGNOSIS — R7309 Other abnormal glucose: Secondary | ICD-10-CM

## 2011-12-25 NOTE — Progress Notes (Signed)
  Subjective:    Patient ID: [name] Macias, male    DOB: 1932-11-05, 78 y.o.   MRN: [id number]  HPI The PT is here for follow up and re-evaluation of chronic medical conditions, medication management and review of any available recent lab and radiology data.  Preventive health is updated, specifically  Cancer screening and Immunization.   Questions or concerns regarding consultations or procedures which the PT has had in the interim are  Addressed.Needs to f/u with urology, lost to f/u and has a h/o elevated PSA The PT denies any adverse reactions to current medications since the last visit.  Classic 3 pillow oerthopnea, wakes up short of breath if he does not use the supplemental oxygen, denies leg edema , chest pain or palpitation, this is not new or worsening, his compliance with nocturnal oxygen is sub optimal per his wife, so he is counseled re the importance of compliance esp in light of cardiomyopathy and chronic heart failure, states he will do better    Review of Systems See HPI Denies recent fever or chills. Denies sinus pressure, nasal congestion, ear pain or sore throat. Denies chest congestion, productive cough or wheezing. Denies chest pains, palpitations and leg swelling Denies abdominal pain, nausea, vomiting,diarrhea or constipation.   Denies dysuria, frequency, hesitancy or incontinence. Denies joint pain, swelling and limitation in mobility. Denies headaches, seizures, numbness, or tingling. Denies depression, anxiety  Denies skin break down or rash.        Objective:   Physical Exam  Patient alert and oriented and in no cardiopulmonary distress.  HEENT: No facial asymmetry, EOMI, no sinus tenderness,  oropharynx pink and moist.  Neck supple no adenopathy.  Chest: Clear to auscultation bilaterally.  CVS: S1, S2 no murmurs, no S3.  ABD: Soft non tender. Bowel sounds normal.  Ext: No edema  MS: Adequate ROM spine, shoulders, hips and knees.  Skin: Intact,  no ulcerations or rash noted.  Psych: Good eye contact, normal affect. Memory intact not anxious or depressed appearing.  CNS: CN 2-12 intact, power, tone and sensation normal throughout.       Assessment & Plan:

## 2011-12-25 NOTE — Patient Instructions (Addendum)
Annual wellness in 4 month, please call if you need me before  PSA, chem 7, uric acid and CBC today  Please stop symbicort, you do not need this    You NEED to use oxygen all night while asleeep, you have heart failure, and you wake up at night short of breath when you do not use it.Your oxygen falls low when you are asleep.  You are referred for colonoscopy by Dr.rehamn   You are referred to Dr Jerre Simon about the prostate    We will set an appt for dr Ladona Ridgel to re check your heart failure in January , it is due at that time

## 2011-12-26 ENCOUNTER — Other Ambulatory Visit: Payer: Self-pay | Admitting: Family Medicine

## 2011-12-26 ENCOUNTER — Encounter (INDEPENDENT_AMBULATORY_CARE_PROVIDER_SITE_OTHER): Payer: Self-pay | Admitting: *Deleted

## 2011-12-26 DIAGNOSIS — R972 Elevated prostate specific antigen [PSA]: Secondary | ICD-10-CM

## 2011-12-26 LAB — PSA: PSA: 9.03 ng/mL — ABNORMAL HIGH (ref <=4.00)

## 2011-12-26 LAB — CBC
HCT: 38.4 % — ABNORMAL LOW (ref 39.0–52.0)
Hemoglobin: 12.7 g/dL — ABNORMAL LOW (ref 13.0–17.0)
MCV: 88.3 fL (ref 78.0–100.0)
RBC: 4.35 MIL/uL (ref 4.22–5.81)
WBC: 6.5 10*3/uL (ref 4.0–10.5)

## 2011-12-26 LAB — BASIC METABOLIC PANEL
Calcium: 9.8 mg/dL (ref 8.4–10.5)
Potassium: 4.8 mEq/L (ref 3.5–5.3)
Sodium: 139 mEq/L (ref 135–145)

## 2011-12-27 MED ORDER — TEMAZEPAM 15 MG PO CAPS: 15.0000 mg | ORAL_CAPSULE | Freq: Every evening | ORAL | Status: DC | PRN

## 2011-12-27 MED ORDER — SPIRONOLACTONE 25 MG PO TABS: 25.0000 mg | ORAL_TABLET | Freq: Every day | ORAL | Status: DC

## 2011-12-29 NOTE — Assessment & Plan Note (Signed)
No recent flare check uric acid level

## 2011-12-29 NOTE — Assessment & Plan Note (Signed)
Further increase in PSA, needs to re establish with urology to further evaluate

## 2011-12-29 NOTE — Assessment & Plan Note (Signed)
Sleep hygiene reviewed. Continue restoril

## 2011-12-29 NOTE — Assessment & Plan Note (Signed)
Asymptomatic and controlled on meds. Needs card follow up scheduled for next month, annual visit with cardiology

## 2011-12-29 NOTE — Assessment & Plan Note (Signed)
Low carb diet encouraged, updated HBa1C needed

## 2011-12-29 NOTE — Assessment & Plan Note (Signed)
Controlled, no change in medication DASH diet and commitment to daily physical activity for a minimum of 30 minutes discussed and encouraged, as a part of hypertension management. The importance of attaining a healthy weight is also discussed.  

## 2011-12-30 LAB — HEMOGLOBIN A1C
Hgb A1c MFr Bld: 6 % — ABNORMAL HIGH (ref <5.7)
Mean Plasma Glucose: 126 mg/dL — ABNORMAL HIGH (ref <117)

## 2012-01-09 ENCOUNTER — Encounter: Payer: Self-pay | Admitting: *Deleted

## 2012-01-23 ENCOUNTER — Ambulatory Visit: Payer: Medicare Other | Admitting: Family Medicine

## 2012-01-27 ENCOUNTER — Other Ambulatory Visit: Payer: Self-pay | Admitting: Internal Medicine

## 2012-01-27 DIAGNOSIS — I509 Heart failure, unspecified: Secondary | ICD-10-CM

## 2012-01-27 DIAGNOSIS — I428 Other cardiomyopathies: Secondary | ICD-10-CM

## 2012-01-27 LAB — ICD DEVICE OBSERVATION
AL AMPLITUDE: 2.125 mv
ATRIAL PACING ICD: 98.81 pct
FVT: 0
LV LEAD IMPEDENCE ICD: 456 Ohm
RV LEAD AMPLITUDE: 30.5 mv
RV LEAD IMPEDENCE ICD: 703 Ohm
RV LEAD THRESHOLD: 0.75 V
TOT-0001: 0
TOT-0006: 20121004000000
TZAT-0001ATACH: 3
TZAT-0001SLOWVT: 1
TZAT-0001SLOWVT: 2
TZAT-0002ATACH: NEGATIVE
TZAT-0002ATACH: NEGATIVE
TZAT-0002FASTVT: NEGATIVE
TZAT-0011SLOWVT: 10 ms
TZAT-0011SLOWVT: 10 ms
TZAT-0012FASTVT: 170 ms
TZAT-0018ATACH: NEGATIVE
TZAT-0018SLOWVT: NEGATIVE
TZAT-0018SLOWVT: NEGATIVE
TZAT-0019ATACH: 6 V
TZAT-0019ATACH: 6 V
TZAT-0019SLOWVT: 8 V
TZAT-0019SLOWVT: 8 V
TZAT-0020ATACH: 1.5 ms
TZAT-0020FASTVT: 1.5 ms
TZAT-0020SLOWVT: 1.5 ms
TZAT-0020SLOWVT: 1.5 ms
TZON-0003VSLOWVT: 450 ms
TZON-0005SLOWVT: 12
TZST-0001ATACH: 5
TZST-0001ATACH: 6
TZST-0001FASTVT: 3
TZST-0001FASTVT: 4
TZST-0001FASTVT: 5
TZST-0001SLOWVT: 4
TZST-0001SLOWVT: 5
TZST-0002ATACH: NEGATIVE
TZST-0002ATACH: NEGATIVE
TZST-0002FASTVT: NEGATIVE
TZST-0002FASTVT: NEGATIVE
TZST-0002FASTVT: NEGATIVE
TZST-0003SLOWVT: 35 J
TZST-0003SLOWVT: 35 J
VF: 0

## 2012-02-25 ENCOUNTER — Ambulatory Visit (INDEPENDENT_AMBULATORY_CARE_PROVIDER_SITE_OTHER): Payer: Medicare Other | Admitting: Family Medicine

## 2012-02-25 ENCOUNTER — Encounter: Payer: Self-pay | Admitting: Family Medicine

## 2012-02-25 VITALS — BP 110/80 | HR 76 | Temp 98.4°F | Resp 16 | Wt 169.0 lb

## 2012-02-25 DIAGNOSIS — G4736 Sleep related hypoventilation in conditions classified elsewhere: Secondary | ICD-10-CM

## 2012-02-25 DIAGNOSIS — J209 Acute bronchitis, unspecified: Secondary | ICD-10-CM

## 2012-02-25 DIAGNOSIS — G4700 Insomnia, unspecified: Secondary | ICD-10-CM

## 2012-02-25 DIAGNOSIS — I10 Essential (primary) hypertension: Secondary | ICD-10-CM

## 2012-02-25 DIAGNOSIS — I428 Other cardiomyopathies: Secondary | ICD-10-CM

## 2012-02-25 MED ORDER — BENZONATATE 100 MG PO CAPS: 100.0000 mg | ORAL_CAPSULE | Freq: Four times a day (QID) | ORAL | Status: DC | PRN

## 2012-02-25 MED ORDER — AZITHROMYCIN 250 MG PO TABS: ORAL_TABLET | ORAL | Status: DC

## 2012-02-25 MED ORDER — AZITHROMYCIN 250 MG PO TABS: ORAL_TABLET | ORAL | Status: AC

## 2012-02-25 NOTE — Patient Instructions (Addendum)
Annual wellness in 3.5 month, cancel sooner appt.Please call ifyou need me before  Medication is sent to your pharmacy for acute bronchitis.  Continue other medication as before

## 2012-02-25 NOTE — Progress Notes (Signed)
  Subjective:    Patient ID: [name] Macias, male    DOB: 1932-08-01, 79 y.o.   MRN: [id number]  HPI 1 week h/o chest congestion, cough and green sputum, denies head pressure, sinus pressure, nasal drainage sore throat or ear pan. Has had chills and low grade fever. Poor sleep at night due to excessive cough   Review of Systems See HPI Denies chest pains, palpitations and leg swelling Denies abdominal pain, nausea, vomiting,diarrhea or constipation.   Denies dysuria, frequency, hesitancy or incontinence. Denies joint pain, swelling and limitation in mobility. Denies headaches, seizures, numbness, or tingling. Denies depression, anxiety or insomnia. Denies skin break down or rash.        Objective:   Physical Exam  Patient alert and oriented and in mild  cardiopulmonary distress.  HEENT: No facial asymmetry, EOMI, no sinus tenderness,  oropharynx pink and moist.  Neck supple no adenopathy.  Chest: decreased though adequate air entry, scattered crackles, no wheezes  CVS: S1, S2 no murmurs, no S3.  ABD: Soft non tender. Bowel sounds normal.  Ext: No edema  MS: Adequate ROM spine, shoulders, hips and knees.  Skin: Intact, no ulcerations or rash noted.  Psych: Good eye contact, normal affect.  not anxious or depressed appearing.  CNS: CN 2-12 intact, power, tone and sensation normal throughout.       Assessment & Plan:

## 2012-02-27 NOTE — Assessment & Plan Note (Signed)
No current symptoms of decompensation, continue meds and cardiology follow up as before

## 2012-02-27 NOTE — Assessment & Plan Note (Signed)
Decongestant and antibiotic prescribed 

## 2012-02-27 NOTE — Assessment & Plan Note (Signed)
Currently requires nocturnal oxygen, heart failure history as well ash/o sleep related hypoxemia

## 2012-02-27 NOTE — Assessment & Plan Note (Signed)
Sleep hygiene reviewed, continue med as before 

## 2012-02-27 NOTE — Assessment & Plan Note (Signed)
Controlled, no change in medication DASH diet and commitment to daily physical activity for a minimum of 30 minutes discussed and encouraged, as a part of hypertension management. The importance of attaining a healthy weight is also discussed.  

## 2012-05-11 ENCOUNTER — Encounter: Payer: Self-pay | Admitting: Internal Medicine

## 2012-05-11 ENCOUNTER — Ambulatory Visit (INDEPENDENT_AMBULATORY_CARE_PROVIDER_SITE_OTHER): Payer: Medicare Other | Admitting: Internal Medicine

## 2012-05-11 VITALS — BP 96/56 | HR 68 | Wt 170.0 lb

## 2012-05-11 DIAGNOSIS — I10 Essential (primary) hypertension: Secondary | ICD-10-CM

## 2012-05-11 DIAGNOSIS — Z95810 Presence of automatic (implantable) cardiac defibrillator: Secondary | ICD-10-CM

## 2012-05-11 DIAGNOSIS — I428 Other cardiomyopathies: Secondary | ICD-10-CM

## 2012-05-11 DIAGNOSIS — I5022 Chronic systolic (congestive) heart failure: Secondary | ICD-10-CM

## 2012-05-11 LAB — ICD DEVICE OBSERVATION
AL IMPEDENCE ICD: 475 Ohm
CHARGE TIME: 9.259 s
HV IMPEDENCE: 65 Ohm
PACEART VT: 0
RV LEAD AMPLITUDE: 6.625 mv
RV LEAD IMPEDENCE ICD: 475 Ohm
TOT-0001: 2
TOT-0002: 0
TZAT-0001ATACH: 3
TZAT-0001SLOWVT: 1
TZAT-0001SLOWVT: 2
TZAT-0002ATACH: NEGATIVE
TZAT-0002ATACH: NEGATIVE
TZAT-0002ATACH: NEGATIVE
TZAT-0002FASTVT: NEGATIVE
TZAT-0012ATACH: 150 ms
TZAT-0018ATACH: NEGATIVE
TZAT-0018ATACH: NEGATIVE
TZAT-0018SLOWVT: NEGATIVE
TZAT-0018SLOWVT: NEGATIVE
TZAT-0019ATACH: 6 V
TZAT-0019ATACH: 6 V
TZAT-0019ATACH: 6 V
TZAT-0019FASTVT: 8 V
TZAT-0019SLOWVT: 8 V
TZAT-0019SLOWVT: 8 V
TZAT-0020ATACH: 1.5 ms
TZON-0005SLOWVT: 12
TZST-0001ATACH: 5
TZST-0001ATACH: 6
TZST-0001FASTVT: 3
TZST-0001FASTVT: 4
TZST-0001FASTVT: 5
TZST-0001SLOWVT: 3
TZST-0001SLOWVT: 4
TZST-0002ATACH: NEGATIVE
TZST-0002ATACH: NEGATIVE
TZST-0002ATACH: NEGATIVE
TZST-0002FASTVT: NEGATIVE
TZST-0002FASTVT: NEGATIVE
TZST-0002FASTVT: NEGATIVE
TZST-0003SLOWVT: 28 J
TZST-0003SLOWVT: 35 J

## 2012-05-11 NOTE — Progress Notes (Signed)
HPI [name] Macias returns today for followup. He is a pleasant 79 yo man with chronic systolic CHF, VT, s/p ICD implant. He has had no problems since I saw him last. He denies chest pain, sob, or syncope. No ICD shock. He is working in his garden, carrying Scientist, research (physical sciences).  Allergies  Allergen Reactions  . Codeine Other (See Comments)    Whelps up  . Enalapril Maleate Other (See Comments)    Whelps up  . Sulfonamide Derivatives Other (See Comments)    Whelps up     Current Outpatient Prescriptions  Medication Sig Dispense Refill  . allopurinol (ZYLOPRIM) 100 MG tablet Take 1 tablet (100 mg total) by mouth daily.  90 tablet  1  . aspirin (ASPIRIN LOW DOSE) 81 MG EC tablet Take 81 mg by mouth daily.        . budesonide-formoterol (SYMBICORT) 160-4.5 MCG/ACT inhaler Inhale 2 puffs into the lungs as directed.      . carvedilol (COREG) 25 MG tablet Take 1 tablet (25 mg total) by mouth 2 (two) times daily with a meal.  180 tablet  1  . furosemide (LASIX) 40 MG tablet Take 1 tablet (40 mg total) by mouth 2 (two) times daily.  180 tablet  1  . indomethacin (INDOCIN) 25 MG capsule Take 25 mg by mouth as needed.      Marland Kitchen losartan (COZAAR) 25 MG tablet Take 1 tablet (25 mg total) by mouth daily.  90 tablet  1  . nitroGLYCERIN (NITROSTAT) 0.4 MG SL tablet Place 1 tablet (0.4 mg total) under the tongue every 5 (five) minutes as needed. For chest pain  30 tablet  0  . NON FORMULARY 2 mLs. Oxygen. Daily at night and PRN during the day.       . spironolactone (ALDACTONE) 25 MG tablet Take 1 tablet (25 mg total) by mouth daily.  90 tablet  1  . temazepam (RESTORIL) 15 MG capsule Take 1 capsule (15 mg total) by mouth at bedtime as needed for sleep.  90 capsule  1  . traMADol (ULTRAM) 50 MG tablet Take 50 mg by mouth every 6 (six) hours as needed for pain.       No current facility-administered medications for this visit.     Past Medical History  Diagnosis Date  . Arthritis   . Gout   .  Non-ischemic cardiomyopathy   . Hypertension   . Arthritis     left foot   . CAD (coronary artery disease) of artery bypass graft   . COPD (chronic obstructive pulmonary disease)   . History of syphilis   . Type 2 HSV infection of penis   . Erectile dysfunction   . Anxiety disorder   . Dementia   . Anxiety   . Angina   . GERD (gastroesophageal reflux disease)     ROS:   All systems reviewed and negative except as noted in the HPI.   Past Surgical History  Procedure Laterality Date  . Left ankle      Not Done by Dr. Romeo Apple   . S/p  biventricular pacemaker / aicd implantation  02/19/05  . Defribrillator implant    . Crtd upgrade  12/14/2009  . Transthoracic echocardiogram  2011  . Doppler echocardiography  2009  . Insert / replace / remove pacemaker       Family History  Problem Relation Age of Onset  . Heart failure Father   . Prostate cancer Brother   .  Diabetes Brother   . Hypertension Brother   . Heart failure Brother      History   Social History  . Marital Status: Married    Spouse Name: N/A    Number of Children: 3  . Years of Education: N/A   Occupational History  . disabled     Social History Main Topics  . Smoking status: Former Smoker -- 1.00 packs/day for 10 years    Types: Cigarettes    Quit date: 01/15/1963  . Smokeless tobacco: Never Used  . Alcohol Use: No  . Drug Use: No  . Sexually Active: Not on file   Other Topics Concern  . Not on file   Social History Narrative  . No narrative on file     BP 96/56  Pulse 68  Wt 170 lb (77.111 kg)  BMI 27.22 kg/m2  Physical Exam:  Well appearing 79 yo man, NAD HEENT: Unremarkable Neck:  7 cm JVD, no thyromegally Lungs:  Clear with no wheezes, rales, or rhonchi HEART:  Regular rate rhythm, no murmurs, no rubs, no clicks Abd:  soft, positive bowel sounds, no organomegally, no rebound, no guarding Ext:  2 plus pulses, no edema, no cyanosis, no clubbing Skin:  No rashes no  nodules Neuro:  CN II through XII intact, motor grossly intact  DEVICE  Normal device function.  See PaceArt for details.   Assess/Plan:

## 2012-05-11 NOTE — Assessment & Plan Note (Signed)
His blood pressure is well controlled. No change in medical therapy. 

## 2012-05-11 NOTE — Assessment & Plan Note (Signed)
His symptoms are class 2a. He will continue his current medical therapy and maintain low sodium diet.

## 2012-05-11 NOTE — Patient Instructions (Addendum)
Your physician recommends that you schedule a follow-up appointment in: 3 MONTHS with Gunnar Fusi for device check   Your physician wants you to follow-up in: 1 YEAR with Dr Ladona Ridgel.  You will receive a reminder letter in the mail two months in advance. If you don't receive a letter, please call our office to schedule the follow-up appointment.  Your physician recommends that you continue on your current medications as directed. Please refer to the Current Medication list given to you today.

## 2012-05-11 NOTE — Assessment & Plan Note (Signed)
His medtronic dual chamber ICD is working normally. Will check in several months.

## 2012-05-12 ENCOUNTER — Ambulatory Visit: Payer: Medicare Other | Admitting: Family Medicine

## 2012-06-09 ENCOUNTER — Ambulatory Visit: Payer: Medicare Other | Admitting: Family Medicine

## 2012-06-11 ENCOUNTER — Other Ambulatory Visit: Payer: Self-pay

## 2012-06-11 DIAGNOSIS — G4700 Insomnia, unspecified: Secondary | ICD-10-CM

## 2012-06-11 MED ORDER — ALLOPURINOL 100 MG PO TABS: 100.0000 mg | ORAL_TABLET | Freq: Every day | ORAL | Status: DC

## 2012-06-11 MED ORDER — CARVEDILOL 25 MG PO TABS: 25.0000 mg | ORAL_TABLET | Freq: Two times a day (BID) | ORAL | Status: DC

## 2012-06-11 MED ORDER — TEMAZEPAM 15 MG PO CAPS: 15.0000 mg | ORAL_CAPSULE | Freq: Every evening | ORAL | Status: DC | PRN

## 2012-06-11 MED ORDER — FUROSEMIDE 40 MG PO TABS: 40.0000 mg | ORAL_TABLET | Freq: Two times a day (BID) | ORAL | Status: DC

## 2012-06-19 ENCOUNTER — Telehealth: Payer: Self-pay | Admitting: Family Medicine

## 2012-06-19 ENCOUNTER — Other Ambulatory Visit: Payer: Self-pay

## 2012-06-19 MED ORDER — INDOMETHACIN 25 MG PO CAPS: 25.0000 mg | ORAL_CAPSULE | ORAL | Status: DC | PRN

## 2012-06-19 NOTE — Telephone Encounter (Signed)
Called patient to find out what pharmacy he wants med sent to.  Sent to [facility] on 5/29

## 2012-06-19 NOTE — Telephone Encounter (Signed)
Indocin printed for signature.

## 2012-06-23 ENCOUNTER — Telehealth: Payer: Self-pay

## 2012-06-23 NOTE — Telephone Encounter (Signed)
Indomethacin needs prior authorization. Want to PA or change? [id number]

## 2012-06-23 NOTE — Telephone Encounter (Signed)
Change, explain to pt that for heart and kidneys better for hi to use tylenol for pain, see what alternatives are available please

## 2012-06-24 NOTE — Telephone Encounter (Signed)
Pt aware.

## 2012-06-25 ENCOUNTER — Telehealth: Payer: Self-pay

## 2012-06-25 ENCOUNTER — Other Ambulatory Visit: Payer: Self-pay | Admitting: Family Medicine

## 2012-06-25 MED ORDER — PREDNISONE 5 MG PO TABS: ORAL_TABLET | ORAL | Status: AC

## 2012-06-25 MED ORDER — INDOMETHACIN 25 MG PO CAPS: ORAL_CAPSULE | ORAL | Status: AC

## 2012-06-25 NOTE — Telephone Encounter (Signed)
Called patient but there is no one at home. No other number listed in chart.  Covered alternatives to be placed on physicians desk. Patient would like to get med locally.  Please advise.

## 2012-06-25 NOTE — Telephone Encounter (Signed)
Spoke directly with pt, states since yesterday he has had swelling of right big toe, unable to walk. Will send to his local pharmacy indomethacin , and prednisone Pt is aware dangerous to take indomethacin  Pls PA the indomethacin

## 2012-06-29 NOTE — Telephone Encounter (Signed)
No PA information received from pharmacy.  No followup call received from patient.

## 2012-10-19 ENCOUNTER — Encounter (INDEPENDENT_AMBULATORY_CARE_PROVIDER_SITE_OTHER): Payer: Self-pay | Admitting: *Deleted

## 2012-11-04 ENCOUNTER — Ambulatory Visit (INDEPENDENT_AMBULATORY_CARE_PROVIDER_SITE_OTHER): Payer: Medicare Other | Admitting: Internal Medicine

## 2012-11-04 ENCOUNTER — Encounter: Payer: Self-pay | Admitting: Internal Medicine

## 2012-11-04 VITALS — BP 124/74 | HR 65 | Ht 66.0 in | Wt 174.0 lb

## 2012-11-04 DIAGNOSIS — I5022 Chronic systolic (congestive) heart failure: Secondary | ICD-10-CM

## 2012-11-04 DIAGNOSIS — Z95810 Presence of automatic (implantable) cardiac defibrillator: Secondary | ICD-10-CM

## 2012-11-04 DIAGNOSIS — I428 Other cardiomyopathies: Secondary | ICD-10-CM

## 2012-11-04 DIAGNOSIS — I10 Essential (primary) hypertension: Secondary | ICD-10-CM

## 2012-11-04 LAB — ICD DEVICE OBSERVATION
AL AMPLITUDE: 4.375 mv
AL IMPEDENCE ICD: 418 Ohm
ATRIAL PACING ICD: 0.24 pct
CHARGE TIME: 9.449 s
FVT: 0
LV LEAD IMPEDENCE ICD: 4047 Ohm
RV LEAD AMPLITUDE: 8.25 mv
RV LEAD IMPEDENCE ICD: 418 Ohm
TOT-0001: 2
TOT-0002: 0
TZAT-0001ATACH: 1
TZAT-0001ATACH: 2
TZAT-0001ATACH: 3
TZAT-0001FASTVT: 1
TZAT-0001SLOWVT: 1
TZAT-0001SLOWVT: 2
TZAT-0002ATACH: NEGATIVE
TZAT-0002FASTVT: NEGATIVE
TZAT-0012ATACH: 150 ms
TZAT-0013SLOWVT: 2
TZAT-0013SLOWVT: 2
TZAT-0018ATACH: NEGATIVE
TZAT-0018ATACH: NEGATIVE
TZAT-0018ATACH: NEGATIVE
TZAT-0018FASTVT: NEGATIVE
TZAT-0018SLOWVT: NEGATIVE
TZAT-0018SLOWVT: NEGATIVE
TZAT-0019ATACH: 6 V
TZAT-0019ATACH: 6 V
TZAT-0019ATACH: 6 V
TZAT-0019FASTVT: 8 V
TZAT-0019SLOWVT: 8 V
TZAT-0019SLOWVT: 8 V
TZAT-0020ATACH: 1.5 ms
TZAT-0020SLOWVT: 1.5 ms
TZAT-0020SLOWVT: 1.5 ms
TZON-0004SLOWVT: 32
TZON-0004VSLOWVT: 20
TZON-0005SLOWVT: 12
TZST-0001ATACH: 4
TZST-0001FASTVT: 3
TZST-0001FASTVT: 4
TZST-0001FASTVT: 5
TZST-0001SLOWVT: 3
TZST-0001SLOWVT: 5
TZST-0001SLOWVT: 6
TZST-0002ATACH: NEGATIVE
TZST-0002FASTVT: NEGATIVE
TZST-0002FASTVT: NEGATIVE
TZST-0003SLOWVT: 28 J

## 2012-11-04 NOTE — Assessment & Plan Note (Signed)
His blood pressure today is well controlled. No change in medical therapy.

## 2012-11-04 NOTE — Assessment & Plan Note (Signed)
His heart failure symptoms remain class II. He'll continue his current medical therapy, and maintain a low-sodium diet.

## 2012-11-04 NOTE — Assessment & Plan Note (Signed)
His Medtronic ICD is working normally. He has had no intercurrent ICD therapies. We'll plan to recheck in several months.

## 2012-11-04 NOTE — Progress Notes (Signed)
HPI [name] returns today for followup. He is a pleasant 79 yo man with chronic systolic CHF, VT, s/p ICD implant. He has had no problems since I saw him last. He denies chest pain, sob, or syncope. No ICD shock. He remains active, working around the house. Allergies  Allergen Reactions  . Codeine Other (See Comments)    Whelps up  . Enalapril Maleate Other (See Comments)    Whelps up  . Sulfonamide Derivatives Other (See Comments)    Whelps up     Current Outpatient Prescriptions  Medication Sig Dispense Refill  . allopurinol (ZYLOPRIM) 100 MG tablet Take 1 tablet (100 mg total) by mouth daily.  90 tablet  1  . aspirin (ASPIRIN LOW DOSE) 81 MG EC tablet Take 81 mg by mouth daily.        . carvedilol (COREG) 25 MG tablet Take 1 tablet (25 mg total) by mouth 2 (two) times daily with a meal.  180 tablet  1  . furosemide (LASIX) 40 MG tablet Take 1 tablet (40 mg total) by mouth 2 (two) times daily.  180 tablet  1  . losartan (COZAAR) 25 MG tablet Take 1 tablet (25 mg total) by mouth daily.  90 tablet  1  . nitroGLYCERIN (NITROSTAT) 0.4 MG SL tablet Place 1 tablet (0.4 mg total) under the tongue every 5 (five) minutes as needed. For chest pain  30 tablet  0  . NON FORMULARY 2 mLs. Oxygen. Daily at night and PRN during the day.       . spironolactone (ALDACTONE) 25 MG tablet Take 1 tablet (25 mg total) by mouth daily.  90 tablet  1  . temazepam (RESTORIL) 15 MG capsule Take 1 capsule (15 mg total) by mouth at bedtime as needed for sleep.  90 capsule  1  . traMADol (ULTRAM) 50 MG tablet Take 50 mg by mouth every 6 (six) hours as needed for pain.       No current facility-administered medications for this visit.     Past Medical History  Diagnosis Date  . Arthritis   . Gout   . Non-ischemic cardiomyopathy   . Hypertension   . Arthritis     left foot   . CAD (coronary artery disease) of artery bypass graft   . COPD (chronic obstructive pulmonary disease)   . History of syphilis   .  Type 2 HSV infection of penis   . Erectile dysfunction   . Anxiety disorder   . Dementia   . Anxiety   . Angina   . GERD (gastroesophageal reflux disease)     ROS:   All systems reviewed and negative except as noted in the HPI.   Past Surgical History  Procedure Laterality Date  . Left ankle      Not Done by Dr. Romeo Apple   . S/p  biventricular pacemaker / aicd implantation  02/19/05  . Defribrillator implant    . Crtd upgrade  12/14/2009  . Transthoracic echocardiogram  2011  . Doppler echocardiography  2009  . Insert / replace / remove pacemaker       Family History  Problem Relation Age of Onset  . Heart failure Father   . Prostate cancer Brother   . Diabetes Brother   . Hypertension Brother   . Heart failure Brother      History   Social History  . Marital Status: Married    Spouse Name: N/A    Number of Children: 3  .  Years of Education: N/A   Occupational History  . disabled     Social History Main Topics  . Smoking status: Former Smoker -- 1.00 packs/day for 10 years    Types: Cigarettes    Quit date: 01/15/1963  . Smokeless tobacco: Never Used  . Alcohol Use: No  . Drug Use: No  . Sexual Activity: Not on file   Other Topics Concern  . Not on file   Social History Narrative  . No narrative on file     BP 124/74  Pulse 65  Ht 5\' 6"  (1.676 m)  Wt 174 lb (78.926 kg)  BMI 28.1 kg/m2  SpO2 97%  Physical Exam:  Well appearing 79 yo man, NAD HEENT: Unremarkable Neck:  7 cm JVD, no thyromegally Lungs:  Clear with no wheezes, rales, or rhonchi HEART:  Regular rate rhythm, no murmurs, no rubs, no clicks Abd:  soft, positive bowel sounds, no organomegally, no rebound, no guarding Ext:  2 plus pulses, no edema, no cyanosis, no clubbing Skin:  No rashes no nodules Neuro:  CN II through XII intact, motor grossly intact  DEVICE  Normal device function.  See PaceArt for details.   Assess/Plan:

## 2012-11-04 NOTE — Patient Instructions (Signed)
Your physician recommends that you schedule a follow-up appointment in: 3 months with [name] Macias and 1 year with Dr Ladona Ridgel

## 2012-11-05 ENCOUNTER — Encounter: Payer: Self-pay | Admitting: Internal Medicine

## 2012-11-18 ENCOUNTER — Ambulatory Visit (INDEPENDENT_AMBULATORY_CARE_PROVIDER_SITE_OTHER): Payer: Medicare Other

## 2012-11-18 DIAGNOSIS — Z23 Encounter for immunization: Secondary | ICD-10-CM

## 2012-12-16 ENCOUNTER — Ambulatory Visit (INDEPENDENT_AMBULATORY_CARE_PROVIDER_SITE_OTHER): Payer: Medicare Other | Admitting: Family Medicine

## 2012-12-16 ENCOUNTER — Encounter (INDEPENDENT_AMBULATORY_CARE_PROVIDER_SITE_OTHER): Payer: Self-pay

## 2012-12-16 ENCOUNTER — Encounter: Payer: Self-pay | Admitting: Family Medicine

## 2012-12-16 VITALS — BP 130/72 | HR 76 | Resp 18 | Ht 66.0 in | Wt 171.0 lb

## 2012-12-16 DIAGNOSIS — N281 Cyst of kidney, acquired: Secondary | ICD-10-CM

## 2012-12-16 DIAGNOSIS — Q619 Cystic kidney disease, unspecified: Secondary | ICD-10-CM

## 2012-12-16 DIAGNOSIS — I5022 Chronic systolic (congestive) heart failure: Secondary | ICD-10-CM

## 2012-12-16 DIAGNOSIS — I10 Essential (primary) hypertension: Secondary | ICD-10-CM

## 2012-12-16 DIAGNOSIS — R7309 Other abnormal glucose: Secondary | ICD-10-CM

## 2012-12-16 DIAGNOSIS — K4090 Unilateral inguinal hernia, without obstruction or gangrene, not specified as recurrent: Secondary | ICD-10-CM

## 2012-12-16 DIAGNOSIS — Z9119 Patient's noncompliance with other medical treatment and regimen: Secondary | ICD-10-CM

## 2012-12-16 DIAGNOSIS — G4700 Insomnia, unspecified: Secondary | ICD-10-CM

## 2012-12-16 DIAGNOSIS — R7303 Prediabetes: Secondary | ICD-10-CM

## 2012-12-16 DIAGNOSIS — K429 Umbilical hernia without obstruction or gangrene: Secondary | ICD-10-CM

## 2012-12-16 DIAGNOSIS — R972 Elevated prostate specific antigen [PSA]: Secondary | ICD-10-CM

## 2012-12-16 DIAGNOSIS — I428 Other cardiomyopathies: Secondary | ICD-10-CM

## 2012-12-16 MED ORDER — TEMAZEPAM 30 MG PO CAPS: 30.0000 mg | ORAL_CAPSULE | Freq: Every evening | ORAL | Status: DC | PRN

## 2012-12-16 NOTE — Progress Notes (Signed)
   Subjective:    Patient ID: [name] Macias, male    DOB: 09/19/1932, 79 y.o.   MRN: [id number]  HPI The PT is here for follow up and re-evaluation of chronic medical conditions, medication management and review of any available recent lab and radiology data.  Preventive health is updated, specifically  Cancer screening and Immunization.Still needs colonoscopy recommended 1 year ago, now past due, also needs to continue to follow urologist re elevated PSA   Questions or concerns regarding consultations or procedures which the PT has had in the interim are  Addressed.Follows regularly with cardiology, no decompensation noted in current year, no hospitalizations. States he is seeing Dr Juanetta Gosling to follow his hernia, will need to look further into this The PT denies any adverse reactions to current medications since the last visit.  C/o very poor sleep despite medication and good hygiene       Review of Systems See HPI Denies recent fever or chills. Denies sinus pressure, nasal congestion, ear pain or sore throat. Denies chest congestion, productive cough or wheezing. Denies chest pains, palpitations and leg swelling Denies abdominal pain, nausea, vomiting,diarrhea or constipation.   Denies dysuria, frequency,  or incontinence.Stream is reduced Chronic joint pain  and limitation in mobility. Denies headaches, seizures, numbness, or tingling. Denies depression, does have mild  anxiety and  insomnia. Denies skin break down or rash.        Objective:   Physical Exam  Patient alert and oriented and in no cardiopulmonary distress.  HEENT: No facial asymmetry, EOMI, no sinus tenderness,  oropharynx pink and moist.  Neck supple no adenopathy.  Chest: Clear to auscultation bilaterally.  CVS: S1, S2 systolic  murmur, no S3.  ABD: Soft non tender. No organomegaly or mass.  Ext: No edema  MS: Adequate though reduced  ROM spine, shoulders, hips and knees.  Skin: Intact, no ulcerations  or rash noted.  Psych: Good eye contact, normal affect. Memory impaired  not anxious or depressed appearing.  CNS: CN 2-12 intact, power,  normal throughout.       Assessment & Plan:

## 2012-12-16 NOTE — Patient Instructions (Signed)
F/u in 4 months, call if you need me before  Higher dose of medication for sleep since current is ineffective. Take restoril 15 mg TWO at bedtime till done, then new script is for restoril 30mg  one at bedtime  HBa1C, fasting lipid, cmp, TSH , CBC this week please  I will send for last note from Dr Juanetta Gosling to get a better understanding of the hernia surveillance  Keep appt with Dr Jerre Simon please, and also hope you can get eye exam by mid next year  We will do depression screen when you next come, improved sleep a will help you alot

## 2013-01-17 DIAGNOSIS — K4090 Unilateral inguinal hernia, without obstruction or gangrene, not specified as recurrent: Secondary | ICD-10-CM | POA: Insufficient documentation

## 2013-01-17 DIAGNOSIS — Z9119 Patient's noncompliance with other medical treatment and regimen: Secondary | ICD-10-CM | POA: Insufficient documentation

## 2013-01-17 DIAGNOSIS — K429 Umbilical hernia without obstruction or gangrene: Secondary | ICD-10-CM | POA: Insufficient documentation

## 2013-01-17 DIAGNOSIS — N281 Cyst of kidney, acquired: Secondary | ICD-10-CM | POA: Insufficient documentation

## 2013-01-17 DIAGNOSIS — Z91199 Patient's noncompliance with other medical treatment and regimen due to unspecified reason: Secondary | ICD-10-CM | POA: Insufficient documentation

## 2013-01-17 NOTE — Assessment & Plan Note (Signed)
C/o poor sleep despite medication Sleep hygiene reviewed. Increase dose of restoril to 30mg 

## 2013-01-17 NOTE — Assessment & Plan Note (Signed)
further follow up and eval by urology needs to be considered

## 2013-01-17 NOTE — Assessment & Plan Note (Signed)
Still has not had colonoscopy recommended 1 year ago, importance of colon screening stressed

## 2013-01-17 NOTE — Assessment & Plan Note (Signed)
Pt followed by urology, needs to keep f/u appt

## 2013-01-17 NOTE — Assessment & Plan Note (Signed)
Pt reports this is being followed by pulmonary?   Will need to review significance of this with him and offer surgical opinion

## 2013-01-17 NOTE — Assessment & Plan Note (Signed)
Reports being followed by pulmonary???Pt is a high surgical risk, however,Ii need to ensure  He understands the symptoms to be aware of , in the event  he develops incarceration. Also need to see if he has ever had surgical opinion , form history provided, this does not seem to be the case

## 2013-01-17 NOTE — Assessment & Plan Note (Signed)
U[pdated lab needed. Will obtain recent labs from pulmonary re educated pt re the need to reduce carb and sweet intake to prevent development of diabetes

## 2013-01-17 NOTE — Assessment & Plan Note (Addendum)
Currently asymptomatic, specifically denies PND or orthopnea. Pt has limited exercise tolerance and this is reportedly at baseline

## 2013-01-17 NOTE — Assessment & Plan Note (Signed)
Controlled, no change in medication  

## 2013-01-17 NOTE — Assessment & Plan Note (Signed)
Evaluated in past month by cardiology, no med changes made.

## 2013-01-19 ENCOUNTER — Telehealth: Payer: Self-pay | Admitting: Family Medicine

## 2013-01-19 DIAGNOSIS — R7302 Impaired glucose tolerance (oral): Secondary | ICD-10-CM

## 2013-01-19 NOTE — Telephone Encounter (Signed)
Spoke directly with the patient he is aware of prostate ca dx , had biopsy last week he says , and states he has been following with Dr Michela Pitcher regularly. States he is on a neb machine and uses treatment once daily at bedtime , ps cofirm with [facility] and enter in his record (styated he was just going to be seeing me as his PCP, so we will nedd this oon his record) He needs a HBA1C dx IGt, he is aware. Pls fax order to lab, he plans to go this Thursday on the same day that he goes to oncology

## 2013-01-22 NOTE — Addendum Note (Signed)
Addended by: Denman George B on: 01/22/2013 09:43 AM   Modules accepted: Orders

## 2013-01-22 NOTE — Telephone Encounter (Signed)
Called [facility] as well as [facility] and neither pharmacy has a nebulizer med on file for the patient.  Patient does have Symbicort on file at [facility].

## 2013-01-22 NOTE — Telephone Encounter (Signed)
Lab order faxed.

## 2013-01-26 LAB — HEMOGLOBIN A1C
HEMOGLOBIN A1C: 6.3 % — AB (ref <5.7)
Mean Plasma Glucose: 134 mg/dL — ABNORMAL HIGH (ref <117)

## 2013-02-03 DIAGNOSIS — C61 Malignant neoplasm of prostate: Secondary | ICD-10-CM | POA: Insufficient documentation

## 2013-02-04 ENCOUNTER — Ambulatory Visit (HOSPITAL_COMMUNITY): Payer: Medicare Other

## 2013-02-05 ENCOUNTER — Encounter (HOSPITAL_COMMUNITY): Payer: Self-pay

## 2013-02-05 NOTE — Progress Notes (Signed)
This encounter was created in error - please disregard.

## 2013-02-10 ENCOUNTER — Telehealth (HOSPITAL_COMMUNITY): Payer: Self-pay | Admitting: *Deleted

## 2013-02-10 NOTE — Telephone Encounter (Signed)
noted 

## 2013-02-10 NOTE — Telephone Encounter (Signed)
Call from patient's wife. Patient saw radiation oncologist at [facility] Has decided not to do any treatment and did not want to r/s appt here to see Dr. Barnet Glasgow.

## 2013-02-17 ENCOUNTER — Encounter: Payer: Self-pay | Admitting: *Deleted

## 2013-03-10 ENCOUNTER — Emergency Department (HOSPITAL_COMMUNITY): Payer: Medicare HMO

## 2013-03-10 ENCOUNTER — Ambulatory Visit (INDEPENDENT_AMBULATORY_CARE_PROVIDER_SITE_OTHER): Payer: Medicare HMO | Admitting: Family Medicine

## 2013-03-10 ENCOUNTER — Encounter (HOSPITAL_COMMUNITY): Payer: Self-pay | Admitting: Emergency Medicine

## 2013-03-10 ENCOUNTER — Encounter (INDEPENDENT_AMBULATORY_CARE_PROVIDER_SITE_OTHER): Payer: Self-pay

## 2013-03-10 ENCOUNTER — Emergency Department (HOSPITAL_COMMUNITY)
Admission: EM | Admit: 2013-03-10 | Discharge: 2013-03-10 | Disposition: A | Discharge status: Home / Self Care | Payer: Medicare HMO | Attending: Emergency Medicine | Admitting: Emergency Medicine

## 2013-03-10 VITALS — BP 118/72 | HR 71 | Resp 16 | Ht 65.0 in | Wt 173.0 lb

## 2013-03-10 DIAGNOSIS — I2510 Atherosclerotic heart disease of native coronary artery without angina pectoris: Secondary | ICD-10-CM | POA: Insufficient documentation

## 2013-03-10 DIAGNOSIS — M109 Gout, unspecified: Secondary | ICD-10-CM | POA: Insufficient documentation

## 2013-03-10 DIAGNOSIS — Q619 Cystic kidney disease, unspecified: Secondary | ICD-10-CM

## 2013-03-10 DIAGNOSIS — Z87891 Personal history of nicotine dependence: Secondary | ICD-10-CM | POA: Insufficient documentation

## 2013-03-10 DIAGNOSIS — I10 Essential (primary) hypertension: Secondary | ICD-10-CM | POA: Insufficient documentation

## 2013-03-10 DIAGNOSIS — Z1211 Encounter for screening for malignant neoplasm of colon: Secondary | ICD-10-CM

## 2013-03-10 DIAGNOSIS — M19079 Primary osteoarthritis, unspecified ankle and foot: Secondary | ICD-10-CM | POA: Insufficient documentation

## 2013-03-10 DIAGNOSIS — Z79899 Other long term (current) drug therapy: Secondary | ICD-10-CM | POA: Insufficient documentation

## 2013-03-10 DIAGNOSIS — R1032 Left lower quadrant pain: Secondary | ICD-10-CM | POA: Insufficient documentation

## 2013-03-10 DIAGNOSIS — N281 Cyst of kidney, acquired: Secondary | ICD-10-CM

## 2013-03-10 DIAGNOSIS — J449 Chronic obstructive pulmonary disease, unspecified: Secondary | ICD-10-CM | POA: Insufficient documentation

## 2013-03-10 DIAGNOSIS — Z8619 Personal history of other infectious and parasitic diseases: Secondary | ICD-10-CM | POA: Insufficient documentation

## 2013-03-10 DIAGNOSIS — R1031 Right lower quadrant pain: Secondary | ICD-10-CM | POA: Insufficient documentation

## 2013-03-10 DIAGNOSIS — C61 Malignant neoplasm of prostate: Secondary | ICD-10-CM

## 2013-03-10 DIAGNOSIS — R109 Unspecified abdominal pain: Secondary | ICD-10-CM

## 2013-03-10 DIAGNOSIS — J4489 Other specified chronic obstructive pulmonary disease: Secondary | ICD-10-CM | POA: Insufficient documentation

## 2013-03-10 DIAGNOSIS — F0390 Unspecified dementia without behavioral disturbance: Secondary | ICD-10-CM | POA: Insufficient documentation

## 2013-03-10 DIAGNOSIS — I5022 Chronic systolic (congestive) heart failure: Secondary | ICD-10-CM

## 2013-03-10 DIAGNOSIS — Z7982 Long term (current) use of aspirin: Secondary | ICD-10-CM | POA: Insufficient documentation

## 2013-03-10 DIAGNOSIS — R7309 Other abnormal glucose: Secondary | ICD-10-CM

## 2013-03-10 DIAGNOSIS — R7303 Prediabetes: Secondary | ICD-10-CM

## 2013-03-10 DIAGNOSIS — F411 Generalized anxiety disorder: Secondary | ICD-10-CM | POA: Insufficient documentation

## 2013-03-10 DIAGNOSIS — R1013 Epigastric pain: Secondary | ICD-10-CM

## 2013-03-10 DIAGNOSIS — Z8719 Personal history of other diseases of the digestive system: Secondary | ICD-10-CM | POA: Insufficient documentation

## 2013-03-10 DIAGNOSIS — Z87448 Personal history of other diseases of urinary system: Secondary | ICD-10-CM | POA: Insufficient documentation

## 2013-03-10 DIAGNOSIS — I209 Angina pectoris, unspecified: Secondary | ICD-10-CM | POA: Insufficient documentation

## 2013-03-10 DIAGNOSIS — Z951 Presence of aortocoronary bypass graft: Secondary | ICD-10-CM | POA: Insufficient documentation

## 2013-03-10 LAB — BASIC METABOLIC PANEL
BUN: 25 mg/dL — ABNORMAL HIGH (ref 6–23)
CALCIUM: 10.1 mg/dL (ref 8.4–10.5)
CO2: 27 meq/L (ref 19–32)
CREATININE: 1.57 mg/dL — AB (ref 0.50–1.35)
Chloride: 104 mEq/L (ref 96–112)
GFR calc Af Amer: 47 mL/min — ABNORMAL LOW (ref >90)
GFR calc non Af Amer: 41 mL/min — ABNORMAL LOW (ref >90)
Glucose, Bld: 98 mg/dL (ref 70–99)
Potassium: 4.1 mEq/L (ref 3.7–5.3)
Sodium: 142 mEq/L (ref 137–147)

## 2013-03-10 LAB — CBC WITH DIFFERENTIAL/PLATELET
Basophils Absolute: 0 10*3/uL (ref 0.0–0.1)
Basophils Relative: 0 % (ref 0–1)
Eosinophils Absolute: 0.1 10*3/uL (ref 0.0–0.7)
Eosinophils Relative: 2 % (ref 0–5)
HEMATOCRIT: 37.8 % — AB (ref 39.0–52.0)
Hemoglobin: 12.5 g/dL — ABNORMAL LOW (ref 13.0–17.0)
LYMPHS ABS: 1.5 10*3/uL (ref 0.7–4.0)
LYMPHS PCT: 26 % (ref 12–46)
MCH: 30 pg (ref 26.0–34.0)
MCHC: 33.1 g/dL (ref 30.0–36.0)
MCV: 90.6 fL (ref 78.0–100.0)
MONOS PCT: 6 % (ref 3–12)
Monocytes Absolute: 0.3 10*3/uL (ref 0.1–1.0)
Neutro Abs: 3.9 10*3/uL (ref 1.7–7.7)
Neutrophils Relative %: 66 % (ref 43–77)
Platelets: 253 10*3/uL (ref 150–400)
RBC: 4.17 MIL/uL — ABNORMAL LOW (ref 4.22–5.81)
RDW: 14.3 % (ref 11.5–15.5)
WBC: 5.9 10*3/uL (ref 4.0–10.5)

## 2013-03-10 LAB — LIPASE, BLOOD: LIPASE: 23 U/L (ref 11–59)

## 2013-03-10 MED ORDER — IOHEXOL 300 MG/ML  SOLN
100.0000 mL | Freq: Once | INTRAMUSCULAR | Status: AC | PRN
  Administered 2013-03-10: 100 mL via INTRAVENOUS

## 2013-03-10 MED ORDER — IOHEXOL 300 MG/ML  SOLN
50.0000 mL | Freq: Once | INTRAMUSCULAR | Status: AC | PRN
  Administered 2013-03-10: 50 mL via ORAL

## 2013-03-10 NOTE — Discharge Instructions (Signed)
Abdominal Pain, Adult °Many things can cause abdominal pain. Usually, abdominal pain is not caused by a disease and will improve without treatment. It can often be observed and treated at home. Your health care provider will do a physical exam and possibly order blood tests and X-rays to help determine the seriousness of your pain. However, in many cases, more time must pass before a clear cause of the pain can be found. Before that point, your health care provider may not know if you need more testing or further treatment. °HOME CARE INSTRUCTIONS  °Monitor your abdominal pain for any changes. The following actions may help to alleviate any discomfort you are experiencing: °· Only take over-the-counter or prescription medicines as directed by your health care provider. °· Do not take laxatives unless directed to do so by your health care provider. °· Try a clear liquid diet (broth, tea, or water) as directed by your health care provider. Slowly move to a bland diet as tolerated. °SEEK MEDICAL CARE IF: °· You have unexplained abdominal pain. °· You have abdominal pain associated with nausea or diarrhea. °· You have pain when you urinate or have a bowel movement. °· You experience abdominal pain that wakes you in the night. °· You have abdominal pain that is worsened or improved by eating food. °· You have abdominal pain that is worsened with eating fatty foods. °SEEK IMMEDIATE MEDICAL CARE IF:  °· Your pain does not go away within 2 hours. °· You have a fever. °· You keep throwing up (vomiting). °· Your pain is felt only in portions of the abdomen, such as the right side or the left lower portion of the abdomen. °· You pass bloody or black tarry stools. °MAKE SURE YOU: °· Understand these instructions.   °· Will watch your condition.   °· Will get help right away if you are not doing well or get worse.   °Document Released: 10/10/2004 Document Revised: 10/21/2012 Document Reviewed: 09/09/2012 °[employer]® Patient  Information ©2014 [employer]. ° °

## 2013-03-10 NOTE — ED Provider Notes (Signed)
CSN: [id number]     Arrival date & time 03/10/13  1226 History   First MD Initiated Contact with Patient 03/10/13 1352     Chief Complaint  Patient presents with  . Abdominal Pain     (Consider location/radiation/quality/duration/timing/severity/associated sxs/prior Treatment) Patient is a 80 y.o. male presenting with abdominal pain. The history is provided by the patient and a relative.  Abdominal Pain  He presents for evaluation of abdominal pain and swelling that has been present for 3 weeks and has worsened. He saw his PCP, today, and she sent him here for further evaluation. He also feels like it is hard to breathe because of the abdominal distention. He denies chest or back pain. He's been able to eat and is not having vomiting. He has not had diarrhea, or constipation. He denies fever, cough, isolated weakness, or dizziness. He's using his medications, as usual. He has an abdominal wall hernia, but does not feel that it is bothering him. There are no other known modifying factors.   Past Medical History  Diagnosis Date  . Arthritis   . Gout   . Non-ischemic cardiomyopathy   . Hypertension   . Arthritis     left foot   . CAD (coronary artery disease) of artery bypass graft   . COPD (chronic obstructive pulmonary disease)   . History of syphilis   . Type 2 HSV infection of penis   . Erectile dysfunction   . Anxiety disorder   . Dementia   . Anxiety   . Angina   . GERD (gastroesophageal reflux disease)    Past Surgical History  Procedure Laterality Date  . Left ankle      Not Done by Dr. Aline Brochure   . S/p  biventricular pacemaker / aicd implantation  02/19/05  . Defribrillator implant    . Crtd upgrade  12/14/2009  . Transthoracic echocardiogram  2011  . Doppler echocardiography  2009  . Insert / replace / remove pacemaker     Family History  Problem Relation Age of Onset  . Heart failure Father   . Prostate cancer Brother   . Diabetes Brother   . Hypertension  Brother   . Heart failure Brother    History  Substance Use Topics  . Smoking status: Former Smoker -- 1.00 packs/day for 10 years    Types: Cigarettes    Quit date: 01/15/1963  . Smokeless tobacco: Never Used  . Alcohol Use: No    Review of Systems  Gastrointestinal: Positive for abdominal pain.  All other systems reviewed and are negative.      Allergies  Codeine; Enalapril maleate; and Sulfonamide derivatives  Home Medications   Current Outpatient Rx  Name  Route  Sig  Dispense  Refill  . acetaminophen (TYLENOL) 500 MG tablet   Oral   Take 1,500 mg by mouth daily as needed for moderate pain.         Marland Kitchen allopurinol (ZYLOPRIM) 100 MG tablet   Oral   Take 1 tablet (100 mg total) by mouth daily.   90 tablet   1   . aspirin (ASPIRIN LOW DOSE) 81 MG EC tablet   Oral   Take 81 mg by mouth daily.           . carvedilol (COREG) 25 MG tablet   Oral   Take 1 tablet (25 mg total) by mouth 2 (two) times daily with a meal.   180 tablet   1   . furosemide (  LASIX) 40 MG tablet   Oral   Take 1 tablet (40 mg total) by mouth 2 (two) times daily.   180 tablet   1   . losartan (COZAAR) 25 MG tablet   Oral   Take 1 tablet (25 mg total) by mouth daily.   90 tablet   1   . nitroGLYCERIN (NITROSTAT) 0.4 MG SL tablet   Sublingual   Place 1 tablet (0.4 mg total) under the tongue every 5 (five) minutes as needed. For chest pain   30 tablet   0   . NON FORMULARY      2 mLs. Oxygen. Daily at night and PRN during the day.          . spironolactone (ALDACTONE) 25 MG tablet   Oral   Take 1 tablet (25 mg total) by mouth daily.   90 tablet   1   . temazepam (RESTORIL) 30 MG capsule   Oral   Take 1 capsule (30 mg total) by mouth at bedtime as needed for sleep.   90 capsule   1     Dose increase effective 12/16/2012   . traMADol (ULTRAM) 50 MG tablet   Oral   Take 50 mg by mouth every 6 (six) hours as needed for pain.          BP 123/72  Pulse 74   Temp(Src) 98.9 F (37.2 C) (Oral)  Resp 22  Wt 173 lb (78.472 kg)  SpO2 94% Physical Exam  Nursing note and vitals reviewed. Constitutional: He is oriented to person, place, and time. He appears well-developed and well-nourished.  Elderly, frail  HENT:  Head: Normocephalic and atraumatic.  Right Ear: External ear normal.  Left Ear: External ear normal.  Eyes: Conjunctivae and EOM are normal. Pupils are equal, round, and reactive to light.  Neck: Normal range of motion and phonation normal. Neck supple.  Cardiovascular: Normal rate, regular rhythm, normal heart sounds and intact distal pulses.   Pulmonary/Chest: Effort normal and breath sounds normal. He exhibits no bony tenderness.  Abdominal: Soft. Normal appearance. He exhibits distension (mild). He exhibits no mass. There is no tenderness (Mild right and left lower quadrant tenderness). There is no rebound and no guarding.  There is a small umbilical region defect, consistent with hernia, but no palpable contents come out within the defect. This area, is not tender to palpation. There is no overlying skin deformity over the umbilical defect.  Musculoskeletal: Normal range of motion.  Neurological: He is alert and oriented to person, place, and time. No cranial nerve deficit or sensory deficit. He exhibits normal muscle tone. Coordination normal.  Skin: Skin is warm, dry and intact.  Psychiatric: He has a normal mood and affect. His behavior is normal. Judgment and thought content normal.    ED Course  Procedures (including critical care time)  Medications  iohexol (OMNIPAQUE) 300 MG/ML solution 50 mL (50 mLs Oral Contrast Given 03/10/13 1459)  iohexol (OMNIPAQUE) 300 MG/ML solution 100 mL (100 mLs Intravenous Contrast Given 03/10/13 1543)    Patient Vitals for the past 24 hrs:  BP Temp Temp src Pulse Resp SpO2 Weight  03/10/13 1624 123/72 mmHg 98.9 F (37.2 C) Oral 74 22 94 % -  03/10/13 1241 131/88 mmHg 97.3 F (36.3 C) Oral  75 20 99 % 173 lb (78.472 kg)    4:37 PM Reevaluation with update and discussion. After initial assessment and treatment, an updated evaluation reveals no further c/o. No vomiting in ED  after contrast. Findings discussed and questions answered. [facility] Review Labs Reviewed  CBC WITH DIFFERENTIAL - Abnormal; Notable for the following:    RBC 4.17 (*)    Hemoglobin 12.5 (*)    HCT 37.8 (*)    All other components within normal limits  BASIC METABOLIC PANEL - Abnormal; Notable for the following:    BUN 25 (*)    Creatinine, Ser 1.57 (*)    GFR calc non Af Amer 41 (*)    GFR calc Af Amer 47 (*)    All other components within normal limits  LIPASE, BLOOD   Imaging Review Ct Abdomen Pelvis W Contrast  03/10/2013   CLINICAL DATA:  Abdominal pain and nausea.  EXAM: CT ABDOMEN AND PELVIS WITH CONTRAST  TECHNIQUE: Multidetector CT imaging of the abdomen and pelvis was performed using the standard protocol following bolus administration of intravenous contrast.  CONTRAST:  3mL OMNIPAQUE IOHEXOL 300 MG/ML SOLN, 141mL OMNIPAQUE IOHEXOL 300 MG/ML SOLN  COMPARISON:  07/13/2010  FINDINGS: The heart is enlarged. Compressive atelectasis is noted in the lingula.  Diffuse low-density of the liver parenchyma is compatible steatosis. The spleen is normal. The stomach, duodenum, pancreas, gallbladder, and adrenal glands are unremarkable.  Cortical scarring is seen in the lower pole of the right kidney with small low-density cortical lesions on the right, likely representing cyst. A pair of dominant cysts is seen in the left kidney towards the upper pole. The larger of the 2 measures 3.9 cm and the smaller of the 2 measures 2.3 cm. These are stable in the interval.  No lymphadenopathy in the abdomen.  No free fluid in the abdomen.  Umbilical hernia contains a knuckle of small bowel without evidence for obstruction or bowel wall thickening.  Imaging through the pelvis shows no free intraperitoneal  fluid. The bladder is decompressed.  Bilateral inguinal hernias, left greater than right, contain only fat. There is advanced diverticular disease in the colon diffusely without diverticulitis. The terminal ileum is normal. The appendix is normal.  Bone windows reveal no worrisome lytic or sclerotic osseous lesions.  IMPRESSION: No acute findings in the abdomen or pelvis to explain the patient's history of abdominal pain with nausea and vomiting.  Bilateral renal cysts without substantial interval change.  Umbilical hernia contains a knuckle of small bowel without complicating features. Bilateral inguinal hernias contain only fat.  Advanced colonic diverticulosis without features of diverticulitis.   Electronically Signed   By: Misty Stanley M.D.   On: 03/10/2013 16:12   Dg Abd Acute W/chest  03/10/2013   CLINICAL DATA:  Abdominal pain  EXAM: ACUTE ABDOMEN SERIES (ABDOMEN 2 VIEW & CHEST 1 VIEW)  COMPARISON:  None.  FINDINGS: Moderate cardiomegaly. Left subclavian AICD device and leads are stable and intact. Prominent central pulmonary vasculature with peripheral pruning. Subsegmental atelectasis at the left base. No pneumothorax.  No free intraperitoneal gas. Mild distention of small and large bowel. No disproportionate dilatation. Vascular calcifications are noted.  IMPRESSION: No active cardiopulmonary disease.  Mild ileus pattern.  No free intraperitoneal gas.   Electronically Signed   By: Maryclare Bean M.D.   On: 03/10/2013 14:22    EKG Interpretation   None       MDM   Final diagnoses:  Abdominal pain   Nonspecific abdominal pain with swelling. No evidence for acute obstructive process, colitis, solid organ abnormalities or traumatic injury.   Nursing Notes Reviewed/ Care Coordinated Applicable Imaging Reviewed Interpretation of Laboratory Data  incorporated into ED treatment  The patient appears reasonably screened and/or stabilized for discharge and I doubt any other medical condition or  other [facility] requiring further screening, evaluation, or treatment in the ED at this time prior to discharge.  Plan: Home Medications- usual; Home Treatments- rest; return here if the recommended treatment, does not improve the symptoms; Recommended follow up- PCP f/u for ongoing eval. And consideration of GI referral     Richarda Blade, MD 03/10/13 [phone]

## 2013-03-10 NOTE — Patient Instructions (Signed)
F/u in 2.5 weeks, to 3 weeks`, call if you need me before  You do need to go to the ED for further evaluation of your generalized abdominal pain and distension which is progressively worsening in the past 2 weeks, but present for the last 1 to 2 month , associated with increased shortness of breath.  I have spoken to the Doctor  About you, so you are expected

## 2013-03-10 NOTE — ED Notes (Signed)
C/o lower abdominal pain times 2 weeks.  Pt states he has a hernia and this feels like his pain from the hernia.  Denies any fever, vomiting or diarrhea.

## 2013-03-10 NOTE — Progress Notes (Signed)
   Subjective:    Patient ID: [name] Macias, male    DOB: 12/12/1932, 80 y.o.   MRN: [id number]  HPI 1 month h/o generalized abdominal pain and swelling , worse in the past 2 weeks, poor apetite, increased shortness of breath, denies nausea, vomiting or change in bowel movements, stomach feels  Swollen and reports increased difficulty with closing his belt due to abdominal distension Feels as though his hernias are the problem Denies nausea , vomiting, diareah and constipation. States he experiences pain in his stomach at times when he eats also Uses 3 pillows when he lies down , this is unchanged, denies PND, leg swelling or increased exertional fatigue, denies chest pain   Review of Systems See HPI Denies recent fever or chills. Denies sinus pressure, nasal congestion, ear pain or sore throat. Denies chest congestion, productive cough or wheezing. Denies chest pains, palpitations and leg swelling Chronic  joint pain,  and limitation in mobility. Denies headaches, seizures, numbness, or tingling. Denies depression, anxiety or insomnia. Denies skin break down or rash.        Objective:   Physical Exam  BP 118/72  Pulse 71  Resp 16  Ht 5\' 5"  (1.651 m)  Wt 173 lb (78.472 kg)  BMI 28.79 kg/m2  SpO2 97% Patient alert and oriented and iunable to lie flat due to shortness of breath. Pt in pain HEENT: No facial asymmetry, EOMI, no sinus tenderness,  oropharynx pink and moist.  Neck decreased ROM, no adenopathy.  Chest: Clear to auscultation bilaterally.  CVS: S1, S2  no S3.  ABD: Distended, diffuse tenderness throughout, no guarding , rebound present . Bowel sounds normal; Rectal deferred  Ext: No edema  MS: Adequate though reduced  ROM spine, shoulders, hips and knees.  Skin: Intact, no ulcerations or rash noted.  Psych: Good eye contact, normal affect. Memory intact not anxious or depressed appearing.  CNS: CN 2-12 intact, power, tone and sensation normal  throughout.       Assessment & Plan:  Abdominal pain, unspecified site Generalized abdominal pain with pain on eating, colonoscopy past due. I spoke directly with pt he now agrees on GI Eval.  At [place] since he was tender, he was sent to ED for eval,  scan, has diverticulosis, he also has umbilical hernia and 2 inguinal hernias no netrapment noted in either and he is aware  Prostate cancer Evaluated at oncology clinic in [place] in 01/2013, patient and his wife are opting for conservative /no treatment at this time  Renal cyst, left Abdominal scan in 2015 notes no significant change in size  HTN (hypertension) Controlled, no change in medication   [place] Denies symptoms suggesting decompensation. Will f/u with cardiology in March/April, on a 1 year schedule  Prediabetes Updated lab needed at/ before next visit. Pt advised to follow low carb diet to reduce risk of developing diabetes

## 2013-03-12 ENCOUNTER — Encounter: Payer: Self-pay | Admitting: Family Medicine

## 2013-03-12 DIAGNOSIS — R109 Unspecified abdominal pain: Secondary | ICD-10-CM | POA: Insufficient documentation

## 2013-03-12 NOTE — Assessment & Plan Note (Signed)
Denies symptoms suggesting decompensation. Will f/u with cardiology in March/April, on a 1 year schedule

## 2013-03-12 NOTE — Assessment & Plan Note (Signed)
Evaluated at oncology clinic in [place] in 01/2013, patient and his wife are opting for conservative /no treatment at this time

## 2013-03-12 NOTE — Assessment & Plan Note (Signed)
Controlled, no change in medication  

## 2013-03-12 NOTE — Assessment & Plan Note (Signed)
Abdominal scan in 2015 notes no significant change in size

## 2013-03-12 NOTE — Assessment & Plan Note (Signed)
Updated lab needed at/ before next visit. Pt advised to follow low carb diet to reduce risk of developing diabetes

## 2013-03-12 NOTE — Assessment & Plan Note (Signed)
Generalized abdominal pain with pain on eating, colonoscopy past due. I spoke directly with pt he now agrees on GI Eval.  At [place] since he was tender, he was sent to ED for eval,  scan, has diverticulosis, he also has umbilical hernia and 2 inguinal hernias no netrapment noted in either and he is aware

## 2013-03-18 ENCOUNTER — Other Ambulatory Visit: Payer: Self-pay | Admitting: Family Medicine

## 2013-03-18 ENCOUNTER — Ambulatory Visit (INDEPENDENT_AMBULATORY_CARE_PROVIDER_SITE_OTHER): Payer: Medicare HMO | Admitting: Family Medicine

## 2013-03-18 VITALS — BP 126/74 | HR 77 | Resp 18

## 2013-03-18 DIAGNOSIS — I10 Essential (primary) hypertension: Secondary | ICD-10-CM

## 2013-03-18 DIAGNOSIS — B369 Superficial mycosis, unspecified: Secondary | ICD-10-CM

## 2013-03-18 DIAGNOSIS — G4700 Insomnia, unspecified: Secondary | ICD-10-CM

## 2013-03-18 MED ORDER — CLOTRIMAZOLE-BETAMETHASONE 1-0.05 % EX LOTN: TOPICAL_LOTION | Freq: Two times a day (BID) | CUTANEOUS | Status: DC

## 2013-03-18 NOTE — Patient Instructions (Signed)
F/u in  3 month  Clotrimazole/betamethasone cream twice daily to rash on right leg for 10 days please

## 2013-03-19 LAB — COMPREHENSIVE METABOLIC PANEL
ALT: 8 U/L (ref 0–53)
AST: 15 U/L (ref 0–37)
Albumin: 4 g/dL (ref 3.5–5.2)
Alkaline Phosphatase: 100 U/L (ref 39–117)
BUN: 35 mg/dL — ABNORMAL HIGH (ref 6–23)
CO2: 24 mEq/L (ref 19–32)
Calcium: 9.4 mg/dL (ref 8.4–10.5)
Chloride: 104 mEq/L (ref 96–112)
Creat: 1.87 mg/dL — ABNORMAL HIGH (ref 0.50–1.35)
Glucose, Bld: 104 mg/dL — ABNORMAL HIGH (ref 70–99)
Potassium: 4.3 mEq/L (ref 3.5–5.3)
Sodium: 138 mEq/L (ref 135–145)
Total Bilirubin: 0.5 mg/dL (ref 0.2–1.2)
Total Protein: 6.9 g/dL (ref 6.0–8.3)

## 2013-03-19 LAB — HEMOGLOBIN A1C
Hgb A1c MFr Bld: 5.9 % — ABNORMAL HIGH (ref <5.7)
Mean Plasma Glucose: 123 mg/dL — ABNORMAL HIGH (ref <117)

## 2013-03-19 LAB — TSH: TSH: 2.458 u[IU]/mL (ref 0.350–4.500)

## 2013-03-19 LAB — LIPID PANEL
Cholesterol: 128 mg/dL (ref 0–200)
HDL: 28 mg/dL — ABNORMAL LOW (ref >39)
LDL Cholesterol: 55 mg/dL (ref 0–99)
Total CHOL/HDL Ratio: 4.6 Ratio
Triglycerides: 226 mg/dL — ABNORMAL HIGH (ref <150)
VLDL: 45 mg/dL — ABNORMAL HIGH (ref 0–40)

## 2013-03-19 LAB — CBC
HCT: 37.7 % — ABNORMAL LOW (ref 39.0–52.0)
Hemoglobin: 11.9 g/dL — ABNORMAL LOW (ref 13.0–17.0)
MCH: 28.5 pg (ref 26.0–34.0)
MCHC: 31.6 g/dL (ref 30.0–36.0)
MCV: 90.2 fL (ref 78.0–100.0)
Platelets: 264 10*3/uL (ref 150–400)
RBC: 4.18 MIL/uL — ABNORMAL LOW (ref 4.22–5.81)
RDW: 14.3 % (ref 11.5–15.5)
WBC: 5.2 10*3/uL (ref 4.0–10.5)

## 2013-03-19 LAB — FERRITIN: Ferritin: 151 ng/mL (ref 22–322)

## 2013-03-19 LAB — IRON: IRON: 81 ug/dL (ref 42–165)

## 2013-03-21 ENCOUNTER — Encounter: Payer: Self-pay | Admitting: Family Medicine

## 2013-03-21 DIAGNOSIS — B369 Superficial mycosis, unspecified: Secondary | ICD-10-CM | POA: Insufficient documentation

## 2013-03-21 NOTE — Assessment & Plan Note (Signed)
Fungal rash on right leg, topical antifungal twice daily for 10 days

## 2013-03-21 NOTE — Assessment & Plan Note (Signed)
Controlled, no change in medication  

## 2013-03-21 NOTE — Progress Notes (Signed)
   Subjective:    Patient ID: [name] Macias, male    DOB: 19-Jul-1932, 80 y.o.   MRN: [id number]  HPI 3 day h/o scratchy rash on right leg, no drainage , no fever , no recollection of direct trauma to the area. Otherwise doing well   Review of Systems    See HPI Denies recent fever or chills. Denies sinus pressure, nasal congestion, ear pain or sore throat. Denies chest congestion, productive cough or wheezing. Denies chest pains, palpitations and leg swelling Denies abdominal pain, nausea, vomiting,diarrhea or constipation.   Chronic  joint pain,  and limitation in mobility.    Objective:   Physical Exam BP 126/74  Pulse 77  Resp 18  SpO2 96% Patient alert and oriented and in no cardiopulmonary distress.  HEENT: No facial asymmetry, EOMI, no sinus tenderness,  oropharynx pink and moist.  Neck decreased ROM, no jVD, no adenopathy.  Chest: Clear to auscultation bilaterally.  CVS: S1, S2 no murmurs, no S3.  ABD: Soft non tender.  Ext: No edema  MS: Adequate though reduced  ROM spine, shoulders, hips and knees.  Skin: fungall rash on lateral aspect of right leg, diameter approx 2.5 cm  Psych: Good eye contact, normal affect. Memory intact not anxious or depressed appearing.  CNS: CN 2-12 intact, power,  normal throughout.        Assessment & Plan:  Dermatomycosis Fungal rash on right leg, topical antifungal twice daily for 10 days  HTN (hypertension) Controlled, no change in medication   INSOMNIA Sleep hygiene reviewed and written information offered also. Prescription sent for  medication needed.

## 2013-03-21 NOTE — Assessment & Plan Note (Signed)
Sleep hygiene reviewed and written information offered also. Prescription sent for  medication needed.  

## 2013-03-22 ENCOUNTER — Other Ambulatory Visit: Payer: Self-pay

## 2013-03-22 MED ORDER — CARVEDILOL 25 MG PO TABS: 25.0000 mg | ORAL_TABLET | Freq: Two times a day (BID) | ORAL | Status: DC

## 2013-03-22 MED ORDER — SPIRONOLACTONE 25 MG PO TABS: 25.0000 mg | ORAL_TABLET | Freq: Every day | ORAL | Status: DC

## 2013-03-22 MED ORDER — FUROSEMIDE 40 MG PO TABS: 40.0000 mg | ORAL_TABLET | Freq: Two times a day (BID) | ORAL | Status: DC

## 2013-03-22 MED ORDER — LOSARTAN POTASSIUM 25 MG PO TABS: 25.0000 mg | ORAL_TABLET | Freq: Every day | ORAL | Status: DC

## 2013-03-22 MED ORDER — ALLOPURINOL 100 MG PO TABS: 100.0000 mg | ORAL_TABLET | Freq: Every day | ORAL | Status: DC

## 2013-03-23 ENCOUNTER — Encounter (INDEPENDENT_AMBULATORY_CARE_PROVIDER_SITE_OTHER): Payer: Self-pay | Admitting: *Deleted

## 2013-04-05 ENCOUNTER — Ambulatory Visit (INDEPENDENT_AMBULATORY_CARE_PROVIDER_SITE_OTHER): Payer: Medicare HMO | Admitting: Internal Medicine

## 2013-04-09 ENCOUNTER — Encounter: Payer: Self-pay | Admitting: Internal Medicine

## 2013-04-09 ENCOUNTER — Ambulatory Visit (INDEPENDENT_AMBULATORY_CARE_PROVIDER_SITE_OTHER): Payer: Commercial Managed Care - HMO | Admitting: *Deleted

## 2013-04-09 DIAGNOSIS — I428 Other cardiomyopathies: Secondary | ICD-10-CM | POA: Diagnosis not present

## 2013-04-09 DIAGNOSIS — I5022 Chronic systolic (congestive) heart failure: Secondary | ICD-10-CM | POA: Diagnosis not present

## 2013-04-09 LAB — MDC_IDC_ENUM_SESS_TYPE_INCLINIC
Brady Statistic AP VS Percent: 0.03 %
Brady Statistic AS VP Percent: 0.02 %
Brady Statistic AS VS Percent: 99.58 %
Brady Statistic RA Percent Paced: 0.4 %
Brady Statistic RV Percent Paced: 0.39 %
Date Time Interrogation Session: 20150327143616
HighPow Impedance: 171 Ohm
HighPow Impedance: 254 Ohm
HighPow Impedance: 342 Ohm
HighPow Impedance: 71 Ohm
Lead Channel Impedance Value: 399 Ohm
Lead Channel Impedance Value: 4047 Ohm
Lead Channel Impedance Value: 4047 Ohm
Lead Channel Impedance Value: 4047 Ohm
Lead Channel Pacing Threshold Amplitude: 0.625 V
Lead Channel Pacing Threshold Amplitude: 0.75 V
Lead Channel Pacing Threshold Pulse Width: 0.4 ms
Lead Channel Sensing Intrinsic Amplitude: 2.75 mV
Lead Channel Sensing Intrinsic Amplitude: 5.25 mV
Lead Channel Setting Pacing Amplitude: 2 V
Lead Channel Setting Pacing Amplitude: 2 V
Lead Channel Setting Sensing Sensitivity: 0.3 mV
MDC IDC MSMT BATTERY VOLTAGE: 3.08 V
MDC IDC MSMT LEADCHNL RA IMPEDANCE VALUE: 456 Ohm
MDC IDC MSMT LEADCHNL RA SENSING INTR AMPL: 3.875 mV
MDC IDC MSMT LEADCHNL RV PACING THRESHOLD PULSEWIDTH: 0.4 ms
MDC IDC MSMT LEADCHNL RV SENSING INTR AMPL: 7.75 mV
MDC IDC SET LEADCHNL RV PACING PULSEWIDTH: 0.4 ms
MDC IDC SET ZONE DETECTION INTERVAL: 450 ms
MDC IDC STAT BRADY AP VP PERCENT: 0.37 %
Zone Setting Detection Interval: 300 ms
Zone Setting Detection Interval: 350 ms
Zone Setting Detection Interval: 350 ms

## 2013-04-09 NOTE — Progress Notes (Signed)
ICD check in office with ICM. 

## 2013-04-21 ENCOUNTER — Ambulatory Visit: Payer: Medicare Other | Admitting: Family Medicine

## 2013-06-02 ENCOUNTER — Encounter: Payer: Self-pay | Admitting: Family Medicine

## 2013-06-04 ENCOUNTER — Telehealth: Payer: Self-pay | Admitting: Family Medicine

## 2013-06-04 DIAGNOSIS — R1013 Epigastric pain: Secondary | ICD-10-CM

## 2013-06-04 NOTE — Telephone Encounter (Signed)
pls get detail, is it that his food is sticking when he tries to swallow?/is it that he gets full easily? need the info and then pls refer to Dr Laural Golden if appropriate  Questions, let me know

## 2013-06-04 NOTE — Telephone Encounter (Signed)
Please advise 

## 2013-06-08 NOTE — Addendum Note (Signed)
Addended by: Denman George B on: 06/08/2013 10:19 AM   Modules accepted: Orders

## 2013-06-08 NOTE — Telephone Encounter (Signed)
Patient states that he has abd pain and distention after eating.  This is making him not want to eat.  Referral entered for Dr. Laural Golden.

## 2013-06-15 ENCOUNTER — Encounter (INDEPENDENT_AMBULATORY_CARE_PROVIDER_SITE_OTHER): Payer: Self-pay | Admitting: Internal Medicine

## 2013-06-15 ENCOUNTER — Encounter (INDEPENDENT_AMBULATORY_CARE_PROVIDER_SITE_OTHER): Payer: Self-pay | Admitting: *Deleted

## 2013-06-15 ENCOUNTER — Other Ambulatory Visit: Payer: Self-pay

## 2013-06-15 ENCOUNTER — Other Ambulatory Visit (INDEPENDENT_AMBULATORY_CARE_PROVIDER_SITE_OTHER): Payer: Self-pay | Admitting: *Deleted

## 2013-06-15 ENCOUNTER — Ambulatory Visit (INDEPENDENT_AMBULATORY_CARE_PROVIDER_SITE_OTHER): Payer: Medicare HMO | Admitting: Internal Medicine

## 2013-06-15 VITALS — BP 100/66 | HR 56 | Temp 98.0°F | Ht 67.0 in | Wt 169.8 lb

## 2013-06-15 DIAGNOSIS — K219 Gastro-esophageal reflux disease without esophagitis: Secondary | ICD-10-CM

## 2013-06-15 DIAGNOSIS — G4700 Insomnia, unspecified: Secondary | ICD-10-CM

## 2013-06-15 MED ORDER — TEMAZEPAM 30 MG PO CAPS: 30.0000 mg | ORAL_CAPSULE | Freq: Every evening | ORAL | Status: DC | PRN

## 2013-06-15 MED ORDER — OMEPRAZOLE 40 MG PO CPDR: 40.0000 mg | DELAYED_RELEASE_CAPSULE | Freq: Every day | ORAL | Status: DC

## 2013-06-15 NOTE — Progress Notes (Signed)
Subjective:     Patient ID: [name] Macias, male   DOB: 11/23/1932, 80 y.o.   MRN: [id number]  HPI Referred to our office by Dr. Moshe Cipro for epigastric. He has had epigastric pain for years. Pain seems to worsen. There has been no weight. Appetite is all right. He says he is scared to eat. When he eats, he will have epigastric pain. He has acid reflux frequently. He will take Tums frequently. He has swelling at time in his upper abdomen after eating. He has bloating. He sometimes has nausea.  No dysphagia. He usually has a BM twice a day. No melena or bright red rectal bleeding.  03/10/2013 CT abdomen/pelvis with CM: abdominal pain, nausea; IMPRESSION:  No acute findings in the abdomen or pelvis to explain the patient's  history of abdominal pain with nausea and vomiting.  Bilateral renal cysts without substantial interval change.  Umbilical hernia contains a knuckle of small bowel without  complicating features. Bilateral inguinal hernias contain only fat.  Advanced colonic diverticulosis without features of diverticulitis.   10/15/2005 EGD/ED: dysphagia:  FINAL DIAGNOSIS: Small sliding hiatal hernia but no evidence of esophagitis  or stricture.  Normal exam of the stomach to second part of the duodenum.  Esophagus empirically dilated given his unexplained symptomatology, but no  mucosal disruption noted.  CBC    Component Value Date/Time   WBC 5.2 03/18/2013 1441   RBC 4.18* 03/18/2013 1441   HGB 11.9* 03/18/2013 1441   HCT 37.7* 03/18/2013 1441   PLT 264 03/18/2013 1441   MCV 90.2 03/18/2013 1441   MCH 28.5 03/18/2013 1441   MCHC 31.6 03/18/2013 1441   RDW 14.3 03/18/2013 1441   LYMPHSABS 1.5 03/10/2013 1348   MONOABS 0.3 03/10/2013 1348   EOSABS 0.1 03/10/2013 1348   BASOSABS 0.0 03/10/2013 1348   CMP     Component Value Date/Time   NA 138 03/18/2013 1441   K 4.3 03/18/2013 1441   CL 104 03/18/2013 1441   CO2 24 03/18/2013 1441   GLUCOSE 104* 03/18/2013 1441   BUN 35* 03/18/2013 1441   CREATININE 1.87*  03/18/2013 1441   CREATININE 1.57* 03/10/2013 1348   CALCIUM 9.4 03/18/2013 1441   PROT 6.9 03/18/2013 1441   ALBUMIN 4.0 03/18/2013 1441   AST 15 03/18/2013 1441   ALT 8 03/18/2013 1441   ALKPHOS 100 03/18/2013 1441   BILITOT 0.5 03/18/2013 1441   GFRNONAA 41* 03/10/2013 1348   GFRAA 47* 03/10/2013 1348        Review of Systems  Past Medical History  Diagnosis Date  . Arthritis   . Gout   . Non-ischemic cardiomyopathy   . Hypertension   . Arthritis     left foot   . CAD (coronary artery disease) of artery bypass graft   . COPD (chronic obstructive pulmonary disease)   . History of syphilis   . Type 2 HSV infection of penis   . Erectile dysfunction   . Anxiety disorder   . Dementia   . Anxiety   . Angina   . GERD (gastroesophageal reflux disease)     Past Surgical History  Procedure Laterality Date  . Left ankle      Not Done by Dr. Aline Brochure   . S/p  biventricular pacemaker / aicd implantation  02/19/05  . Defribrillator implant    . Crtd upgrade  12/14/2009  . Transthoracic echocardiogram  2011  . Doppler echocardiography  2009  . Insert / replace / remove pacemaker  Allergies  Allergen Reactions  . Codeine Other (See Comments)    Whelps up  . Enalapril Maleate Other (See Comments)    Whelps up  . Sulfonamide Derivatives Other (See Comments)    Whelps up    Current Outpatient Prescriptions on File Prior to Visit  Medication Sig Dispense Refill  . acetaminophen (TYLENOL) 500 MG tablet Take 1,500 mg by mouth daily as needed for moderate pain.      Marland Kitchen allopurinol (ZYLOPRIM) 100 MG tablet Take 1 tablet (100 mg total) by mouth daily.  90 tablet  1  . aspirin (ASPIRIN LOW DOSE) 81 MG EC tablet Take 81 mg by mouth daily.        . carvedilol (COREG) 25 MG tablet Take 1 tablet (25 mg total) by mouth 2 (two) times daily with a meal.  180 tablet  1  . furosemide (LASIX) 40 MG tablet Take 1 tablet (40 mg total) by mouth 2 (two) times daily.  180 tablet  1  . losartan (COZAAR) 25  MG tablet Take 1 tablet (25 mg total) by mouth daily.  90 tablet  1  . nitroGLYCERIN (NITROSTAT) 0.4 MG SL tablet Place 1 tablet (0.4 mg total) under the tongue every 5 (five) minutes as needed. For chest pain  30 tablet  0  . NON FORMULARY 2 mLs. Oxygen. Daily at night and PRN during the day.       . spironolactone (ALDACTONE) 25 MG tablet Take 1 tablet (25 mg total) by mouth daily.  90 tablet  1  . traMADol (ULTRAM) 50 MG tablet Take 50 mg by mouth every 6 (six) hours as needed for pain.      . clotrimazole-betamethasone (LOTRISONE) lotion Apply topically 2 (two) times daily.  30 mL  0   No current facility-administered medications on file prior to visit.        Objective:   Physical Exam  Filed Vitals:   06/15/13 1524  BP: 100/66  Pulse: 56  Temp: 98 F (36.7 C)  Height: 5\' 7"  (1.702 m)  Weight: 169 lb 12.8 oz (77.021 kg)   Alert and oriented. Skin warm and dry. Oral mucosa is moist.   . Sclera anicteric, conjunctivae is pink. Thyroid not enlarged. No cervical lymphadenopathy. Lungs clear. Heart regular rate and rhythm.  Abdomen is soft. Bowel sounds are positive. No hepatomegaly. No abdominal masses felt. Epigastric  tenderness.  No edema to lower extremities.       Assessment:     Epigastric pain. PUD needs to be ruled out.     Plan:     EGD with Dr Laural Golden. The risks and benefits such as perforation, bleeding, and infection were reviewed with the patient and is agreeable.   Rx for Omeprazole 40mg  po daily.

## 2013-06-15 NOTE — Patient Instructions (Signed)
EGD. Omeprazole 40mg  daily.

## 2013-06-16 ENCOUNTER — Encounter (HOSPITAL_COMMUNITY): Payer: Self-pay

## 2013-06-23 ENCOUNTER — Ambulatory Visit (INDEPENDENT_AMBULATORY_CARE_PROVIDER_SITE_OTHER): Payer: Medicare HMO | Admitting: Internal Medicine

## 2013-07-01 ENCOUNTER — Telehealth: Payer: Self-pay | Admitting: Family Medicine

## 2013-07-01 ENCOUNTER — Ambulatory Visit (HOSPITAL_COMMUNITY)
Admission: RE | Admit: 2013-07-01 | Discharge: 2013-07-01 | Disposition: A | Discharge status: Home / Self Care | Payer: Medicare HMO | Source: Ambulatory Visit | Attending: Internal Medicine | Admitting: Internal Medicine

## 2013-07-01 ENCOUNTER — Encounter (HOSPITAL_COMMUNITY): Payer: Self-pay | Admitting: *Deleted

## 2013-07-01 ENCOUNTER — Encounter (HOSPITAL_COMMUNITY)
Admission: RE | Disposition: A | Discharge status: Home / Self Care | Payer: Self-pay | Source: Ambulatory Visit | Attending: Internal Medicine

## 2013-07-01 ENCOUNTER — Other Ambulatory Visit: Payer: Self-pay

## 2013-07-01 DIAGNOSIS — K449 Diaphragmatic hernia without obstruction or gangrene: Secondary | ICD-10-CM

## 2013-07-01 DIAGNOSIS — I2510 Atherosclerotic heart disease of native coronary artery without angina pectoris: Secondary | ICD-10-CM | POA: Insufficient documentation

## 2013-07-01 DIAGNOSIS — Z7982 Long term (current) use of aspirin: Secondary | ICD-10-CM | POA: Insufficient documentation

## 2013-07-01 DIAGNOSIS — I428 Other cardiomyopathies: Secondary | ICD-10-CM | POA: Insufficient documentation

## 2013-07-01 DIAGNOSIS — Z95810 Presence of automatic (implantable) cardiac defibrillator: Secondary | ICD-10-CM | POA: Insufficient documentation

## 2013-07-01 DIAGNOSIS — K219 Gastro-esophageal reflux disease without esophagitis: Secondary | ICD-10-CM | POA: Insufficient documentation

## 2013-07-01 DIAGNOSIS — Z79899 Other long term (current) drug therapy: Secondary | ICD-10-CM | POA: Insufficient documentation

## 2013-07-01 DIAGNOSIS — R1310 Dysphagia, unspecified: Secondary | ICD-10-CM

## 2013-07-01 DIAGNOSIS — Z882 Allergy status to sulfonamides status: Secondary | ICD-10-CM | POA: Insufficient documentation

## 2013-07-01 DIAGNOSIS — M19079 Primary osteoarthritis, unspecified ankle and foot: Secondary | ICD-10-CM | POA: Insufficient documentation

## 2013-07-01 DIAGNOSIS — R1013 Epigastric pain: Secondary | ICD-10-CM

## 2013-07-01 DIAGNOSIS — Z87891 Personal history of nicotine dependence: Secondary | ICD-10-CM | POA: Insufficient documentation

## 2013-07-01 DIAGNOSIS — I10 Essential (primary) hypertension: Secondary | ICD-10-CM | POA: Insufficient documentation

## 2013-07-01 DIAGNOSIS — F0390 Unspecified dementia without behavioral disturbance: Secondary | ICD-10-CM | POA: Insufficient documentation

## 2013-07-01 DIAGNOSIS — Z951 Presence of aortocoronary bypass graft: Secondary | ICD-10-CM | POA: Insufficient documentation

## 2013-07-01 DIAGNOSIS — M109 Gout, unspecified: Secondary | ICD-10-CM | POA: Insufficient documentation

## 2013-07-01 HISTORY — PX: ESOPHAGOGASTRODUODENOSCOPY: SHX5428

## 2013-07-01 SURGERY — EGD (ESOPHAGOGASTRODUODENOSCOPY)
Anesthesia: Moderate Sedation

## 2013-07-01 MED ORDER — BUTAMBEN-TETRACAINE-BENZOCAINE 2-2-14 % EX AERO
INHALATION_SPRAY | CUTANEOUS | Status: DC | PRN
  Administered 2013-07-01: 2 via TOPICAL

## 2013-07-01 MED ORDER — TRAMADOL HCL 50 MG PO TABS: 50.0000 mg | ORAL_TABLET | Freq: Four times a day (QID) | ORAL | Status: DC | PRN

## 2013-07-01 MED ORDER — MIDAZOLAM HCL 5 MG/5ML IJ SOLN
INTRAMUSCULAR | Status: AC
  Filled 2013-07-01: qty 10

## 2013-07-01 MED ORDER — NITROGLYCERIN 0.4 MG SL SUBL
0.4000 mg | SUBLINGUAL_TABLET | SUBLINGUAL | Status: DC | PRN
Start: 2013-07-01 — End: 2013-07-06

## 2013-07-01 MED ORDER — STERILE WATER FOR IRRIGATION IR SOLN
Status: DC | PRN
  Administered 2013-07-01: 12:00:00

## 2013-07-01 MED ORDER — SODIUM CHLORIDE 0.9 % IV SOLN
INTRAVENOUS | Status: DC
Start: 2013-07-01 — End: 2013-07-01
  Administered 2013-07-01: 12:00:00 via INTRAVENOUS

## 2013-07-01 MED ORDER — MEPERIDINE HCL 50 MG/ML IJ SOLN
INTRAMUSCULAR | Status: DC | PRN
  Administered 2013-07-01: 25 mg via INTRAVENOUS

## 2013-07-01 MED ORDER — MIDAZOLAM HCL 5 MG/5ML IJ SOLN
INTRAMUSCULAR | Status: DC | PRN
  Administered 2013-07-01 (×3): 1 mg via INTRAVENOUS
  Administered 2013-07-01: 2 mg via INTRAVENOUS

## 2013-07-01 MED ORDER — MEPERIDINE HCL 50 MG/ML IJ SOLN
INTRAMUSCULAR | Status: AC
  Filled 2013-07-01: qty 1

## 2013-07-01 NOTE — Telephone Encounter (Signed)
Med refilled.

## 2013-07-01 NOTE — Op Note (Signed)
EGD PROCEDURE REPORT  PATIENT:  [name] Macias  MR#:  [id number] Birthdate:  December 28, 1932, 80 y.o., male Endoscopist:  Dr. Rogene Houston, MD Referred By:  Dr. Tula Nakayama, MD  Procedure Date: 07/01/2013  Procedure:   EGD with ED.  Indications:  Patient is 80 year old African American male with intermittent epigastric pain and negative CT. He feels better since he's been on omeprazole. Patient also has experienced intermittent solid food dysphagia. He denies nausea vomiting melena or rectal bleeding.             Informed Consent:  The risks, benefits, alternatives & imponderables which include, but are not limited to, bleeding, infection, perforation, drug reaction and potential missed lesion have been reviewed.  The potential for biopsy, lesion removal, esophageal dilation, etc. have also been discussed.  Questions have been answered.  All parties agreeable.  Please see history & physical in medical record for more information.  Medications:  Demerol 25 mg IV Versed 4 mg IV Cetacaine spray topically for oropharyngeal anesthesia  Description of procedure:  The endoscope was introduced through the mouth and advanced to the second portion of the duodenum without difficulty or limitations. The mucosal surfaces were surveyed very carefully during advancement of the scope and upon withdrawal.  Findings:  Esophagus:  Mucosa of the esophagus was normal. GE junction was unremarkable without ring or stricture formation. GEJ:  43 cm Hiatus:  45 cm Stomach:  Stomach was empty and distended very well with insufflation. Folds in the proximal stomach were normal. Examination of mucosa at gastric body, antrum, pyloric channel, angularis fundus and cardia was normal. Duodenum:  Normal bulbar and post bulbar mucosa.  Therapeutic/Diagnostic Maneuvers Performed:   Esophagus was dilated by passing 54 [place] Maloney dilator to full insertion. As the dilator was withdrawn and the scope was passed again and no  mucosal destruction noted to esophagus.  Complications:  None  Impression: Small sliding heart hernia otherwise normal EGD. Esophagus dilated by passing 54 [place] Maloney dilator but no mucosal destruction noted. He possibly has esophageal motility disorder.  Recommendations:  Will decrease omeprazole dose to 20 mg by mouth every morning when the current prescription runs out. Patient advised to chew food thoroughly and eat slowly. He will call office if abdominal pain recurs.  Avyana Puffenbarger U  07/01/2013  12:50 PM  CC: Dr. Tula Nakayama, MD & Dr. Rayne Du ref. provider found

## 2013-07-01 NOTE — H&P (Addendum)
[name] Macias is an 80 y.o. male.   Chief Complaint: Patient's here for EGD and ED. HPI: Patient is 80 year old African American male with multiple medical problems who presents with recurrent epigastric pain. He also has been experienced dysphagia with solids. Patient states epigastric pain has improved since he has been on omeprazole. He is on low-dose aspirin but does not take other NSAIDs. He denies melena rectal bleeding anorexia or weight loss. His esophagus was last dilated in October 2007.  Past Medical History  Diagnosis Date  . Arthritis   . Gout   . Non-ischemic cardiomyopathy   . Hypertension   . Arthritis     left foot   . CAD (coronary artery disease) of artery bypass graft   . COPD (chronic obstructive pulmonary disease)   . History of syphilis   . Type 2 HSV infection of penis   . Erectile dysfunction   . Anxiety disorder   . Dementia   . Anxiety   . Angina   . GERD (gastroesophageal reflux disease)     Past Surgical History  Procedure Laterality Date  . Left ankle      Not Done by Dr. Aline Brochure   . S/p  biventricular pacemaker / aicd implantation  02/19/05  . Defribrillator implant    . Crtd upgrade  12/14/2009  . Transthoracic echocardiogram  2011  . Doppler echocardiography  2009  . Insert / replace / remove pacemaker      Family History  Problem Relation Age of Onset  . Heart failure Father   . Prostate cancer Brother   . Diabetes Brother   . Hypertension Brother   . Heart failure Brother    Social History:  reports that he quit smoking about 50 years ago. His smoking use included Cigarettes. He has a 10 pack-year smoking history. He has never used smokeless tobacco. He reports that he does not drink alcohol or use illicit drugs.  Allergies:  Allergies  Allergen Reactions  . Codeine Other (See Comments)    Whelps up  . Enalapril Maleate Other (See Comments)    Whelps up  . Sulfonamide Derivatives Other (See Comments)    Whelps up    Medications  Prior to Admission  Medication Sig Dispense Refill  . allopurinol (ZYLOPRIM) 100 MG tablet Take 1 tablet (100 mg total) by mouth daily.  90 tablet  1  . aspirin (ASPIRIN LOW DOSE) 81 MG EC tablet Take 81 mg by mouth daily.        . carvedilol (COREG) 25 MG tablet Take 1 tablet (25 mg total) by mouth 2 (two) times daily with a meal.  180 tablet  1  . furosemide (LASIX) 40 MG tablet Take 1 tablet (40 mg total) by mouth 2 (two) times daily.  180 tablet  1  . losartan (COZAAR) 25 MG tablet Take 1 tablet (25 mg total) by mouth daily.  90 tablet  1  . NON FORMULARY 2 mLs. Oxygen. Daily at night and PRN during the day.       Marland Kitchen omeprazole (PRILOSEC) 40 MG capsule Take 1 capsule (40 mg total) by mouth daily.  30 capsule  3  . spironolactone (ALDACTONE) 25 MG tablet Take 1 tablet (25 mg total) by mouth daily.  90 tablet  1  . temazepam (RESTORIL) 30 MG capsule Take 1 capsule (30 mg total) by mouth at bedtime as needed for sleep.  90 capsule  1  . traMADol (ULTRAM) 50 MG tablet Take 1 tablet (  50 mg total) by mouth every 6 (six) hours as needed.  90 tablet  0  . acetaminophen (TYLENOL) 500 MG tablet Take 1,000 mg by mouth daily as needed for moderate pain.       . nitroGLYCERIN (NITROSTAT) 0.4 MG SL tablet Place 1 tablet (0.4 mg total) under the tongue every 5 (five) minutes as needed. For chest pain  30 tablet  0    No results found for this or any previous visit (from the past 48 hour(s)). No results found.  ROS  Blood pressure 126/73, pulse 60, temperature 97.7 F (36.5 C), temperature source Oral, resp. rate 20, height 5\' 7"  (1.702 m), weight 169 lb (76.658 kg), SpO2 100.00%. Physical Exam  Constitutional: He appears well-developed and well-nourished.  HENT:  Mouth/Throat: Oropharynx is clear and moist.  Eyes: Conjunctivae are normal.  Neck: Normal range of motion. No thyromegaly present.  Cardiovascular: Normal rate and normal heart sounds.   No murmur heard. Respiratory: Effort normal and  breath sounds normal.  AICD device in left pectoral region  GI:  Small umbilical hernia which is completely reducible  Musculoskeletal: He exhibits no edema.  Lymphadenopathy:    He has no cervical adenopathy.  Neurological: He is alert.  Skin: Skin is warm and dry.     Assessment/Plan Recurrent epigastric pain. Solid food dysphagia. EGD and ED.  [name] Macias,[name] Macias 07/01/2013, 12:22 PM

## 2013-07-01 NOTE — Discharge Instructions (Signed)
Resume usual medications and diet. Call office when current prescription of omeprazole runs out; will then decrease dose to 20 mg daily. Call if abdominal pain recurs. No driving for 24 hours. Esophagogastroduodenoscopy Care After Refer to this sheet in the next few weeks. These instructions provide you with information on caring for yourself after your procedure. Your caregiver may also give you more specific instructions. Your treatment has been planned according to current medical practices, but problems sometimes occur. Call your caregiver if you have any problems or questions after your procedure.  HOME CARE INSTRUCTIONS  Do not eat or drink anything until the numbing medicine (local anesthetic) has worn off and your gag reflex has returned. You will know that the local anesthetic has worn off when you can swallow comfortably.  Do not drive for 12 hours after the procedure or as directed by your caregiver.  Only take medicines as directed by your caregiver. SEEK MEDICAL CARE IF:   You cannot stop coughing.  You are not urinating at all or less than usual. SEEK IMMEDIATE MEDICAL CARE IF:  You have difficulty swallowing.  You cannot eat or drink.  You have worsening throat or chest pain.  You have dizziness, lightheadedness, or you faint.  You have nausea or vomiting.  You have chills.  You have a fever.  You have severe abdominal pain.  You have black, tarry, or bloody stools. Document Released: 12/18/2011 Document Reviewed: 12/18/2011 [facility] Patient Information 2015 [place]. This information is not intended to replace advice given to you by your health care provider. Make sure you discuss any questions you have with your health care provider.

## 2013-07-06 ENCOUNTER — Other Ambulatory Visit: Payer: Self-pay

## 2013-07-06 MED ORDER — NITROGLYCERIN 0.4 MG SL SUBL: 0.4000 mg | SUBLINGUAL_TABLET | SUBLINGUAL | Status: DC | PRN

## 2013-07-09 ENCOUNTER — Encounter (HOSPITAL_COMMUNITY): Payer: Self-pay | Admitting: Internal Medicine

## 2013-08-10 ENCOUNTER — Encounter: Payer: Self-pay | Admitting: *Deleted

## 2013-10-09 ENCOUNTER — Other Ambulatory Visit (INDEPENDENT_AMBULATORY_CARE_PROVIDER_SITE_OTHER): Payer: Self-pay | Admitting: Internal Medicine

## 2013-10-09 DIAGNOSIS — K219 Gastro-esophageal reflux disease without esophagitis: Secondary | ICD-10-CM

## 2013-10-11 ENCOUNTER — Other Ambulatory Visit (INDEPENDENT_AMBULATORY_CARE_PROVIDER_SITE_OTHER): Payer: Self-pay | Admitting: Internal Medicine

## 2013-10-11 MED ORDER — OMEPRAZOLE 20 MG PO CPDR: 20.0000 mg | DELAYED_RELEASE_CAPSULE | Freq: Every day | ORAL | Status: DC

## 2013-11-03 ENCOUNTER — Telehealth: Payer: Self-pay | Admitting: Family Medicine

## 2013-11-03 ENCOUNTER — Other Ambulatory Visit: Payer: Self-pay

## 2013-11-03 MED ORDER — LOSARTAN POTASSIUM 25 MG PO TABS: 25.0000 mg | ORAL_TABLET | Freq: Every day | ORAL | Status: DC

## 2013-11-03 MED ORDER — SPIRONOLACTONE 25 MG PO TABS: 25.0000 mg | ORAL_TABLET | Freq: Every day | ORAL | Status: AC

## 2013-11-03 MED ORDER — SPIRONOLACTONE 25 MG PO TABS: 25.0000 mg | ORAL_TABLET | Freq: Every day | ORAL | Status: DC

## 2013-11-03 MED ORDER — CARVEDILOL 25 MG PO TABS: 25.0000 mg | ORAL_TABLET | Freq: Two times a day (BID) | ORAL | Status: DC

## 2013-11-03 MED ORDER — ALLOPURINOL 100 MG PO TABS
100.0000 mg | ORAL_TABLET | Freq: Every day | ORAL | Status: DC
Start: 2013-11-03 — End: 2013-11-03

## 2013-11-03 MED ORDER — OMEPRAZOLE 40 MG PO CPDR: 40.0000 mg | DELAYED_RELEASE_CAPSULE | Freq: Every day | ORAL | Status: DC

## 2013-11-03 MED ORDER — ALLOPURINOL 100 MG PO TABS: 100.0000 mg | ORAL_TABLET | Freq: Every day | ORAL | Status: DC

## 2013-11-03 MED ORDER — FUROSEMIDE 40 MG PO TABS: 40.0000 mg | ORAL_TABLET | Freq: Two times a day (BID) | ORAL | Status: AC

## 2013-11-03 MED ORDER — FUROSEMIDE 40 MG PO TABS
40.0000 mg | ORAL_TABLET | Freq: Two times a day (BID) | ORAL | Status: DC
Start: 2013-11-03 — End: 2013-11-03

## 2013-11-03 MED ORDER — LOSARTAN POTASSIUM 25 MG PO TABS: 25.0000 mg | ORAL_TABLET | Freq: Every day | ORAL | Status: AC

## 2013-11-03 NOTE — Telephone Encounter (Signed)
meds sent

## 2013-11-18 ENCOUNTER — Telehealth: Payer: Self-pay | Admitting: Family Medicine

## 2013-11-18 DIAGNOSIS — R05 Cough: Secondary | ICD-10-CM

## 2013-11-18 DIAGNOSIS — R059 Cough, unspecified: Secondary | ICD-10-CM

## 2013-11-18 MED ORDER — HYDROCOD POLST-CHLORPHEN POLST 10-8 MG/5ML PO LQCR
ORAL | Status: DC
Start: 2013-11-18 — End: 2013-11-30

## 2013-11-18 NOTE — Telephone Encounter (Signed)
Coughing for 3 weeks now, yellow phlegm, chills and aching. Has to sleep on 4 pillows at night and can't sleep. [name] Macias was very upset because she has taken [place] to [place] and they wouldn't see him because they said he needed to come here. She said Penicillin doesn't help and he needs Tussionex like before.

## 2013-11-18 NOTE — Addendum Note (Signed)
Addended by: Eual Fines on: 11/18/2013 04:11 PM   Modules accepted: Orders

## 2013-11-18 NOTE — Telephone Encounter (Signed)
Patient aware and orders sent and coming to collect script

## 2013-11-18 NOTE — Telephone Encounter (Signed)
Pt's wife called stating her husband has a bad cough and is sick. Please advise

## 2013-11-18 NOTE — Telephone Encounter (Signed)
Needs appt next week, last here in March and none in system, pls order CXR , based on symptoms, send in tussionex 5cc at bedtime as needed x 120 cc

## 2013-11-22 ENCOUNTER — Ambulatory Visit: Payer: Commercial Managed Care - HMO | Admitting: Family Medicine

## 2013-11-22 ENCOUNTER — Encounter: Payer: Self-pay | Admitting: Family Medicine

## 2013-11-29 ENCOUNTER — Other Ambulatory Visit: Payer: Self-pay

## 2013-11-29 ENCOUNTER — Telehealth: Payer: Self-pay | Admitting: Family Medicine

## 2013-11-29 DIAGNOSIS — G4700 Insomnia, unspecified: Secondary | ICD-10-CM

## 2013-11-29 MED ORDER — TRAMADOL HCL 50 MG PO TABS: 50.0000 mg | ORAL_TABLET | Freq: Four times a day (QID) | ORAL | Status: DC | PRN

## 2013-11-29 MED ORDER — TEMAZEPAM 30 MG PO CAPS
30.0000 mg | ORAL_CAPSULE | Freq: Every evening | ORAL | Status: DC | PRN
Start: 2013-11-29 — End: 2014-10-31

## 2013-11-29 NOTE — Telephone Encounter (Signed)
Patient aware he needs a visit before meds can be refilled. Appt for in the am. Refills pending visit

## 2013-11-30 ENCOUNTER — Ambulatory Visit (INDEPENDENT_AMBULATORY_CARE_PROVIDER_SITE_OTHER): Payer: Medicare HMO | Admitting: Family Medicine

## 2013-11-30 ENCOUNTER — Encounter: Payer: Self-pay | Admitting: Family Medicine

## 2013-11-30 ENCOUNTER — Encounter (INDEPENDENT_AMBULATORY_CARE_PROVIDER_SITE_OTHER): Payer: Self-pay

## 2013-11-30 VITALS — BP 120/74 | HR 78 | Resp 16 | Ht 65.0 in | Wt 167.0 lb

## 2013-11-30 DIAGNOSIS — K219 Gastro-esophageal reflux disease without esophagitis: Secondary | ICD-10-CM

## 2013-11-30 DIAGNOSIS — C61 Malignant neoplasm of prostate: Secondary | ICD-10-CM

## 2013-11-30 DIAGNOSIS — I10 Essential (primary) hypertension: Secondary | ICD-10-CM

## 2013-11-30 DIAGNOSIS — J42 Unspecified chronic bronchitis: Secondary | ICD-10-CM

## 2013-11-30 DIAGNOSIS — R7303 Prediabetes: Secondary | ICD-10-CM

## 2013-11-30 DIAGNOSIS — Z13220 Encounter for screening for lipoid disorders: Secondary | ICD-10-CM

## 2013-11-30 DIAGNOSIS — R7301 Impaired fasting glucose: Secondary | ICD-10-CM

## 2013-11-30 DIAGNOSIS — Z1211 Encounter for screening for malignant neoplasm of colon: Secondary | ICD-10-CM

## 2013-11-30 DIAGNOSIS — G4734 Idiopathic sleep related nonobstructive alveolar hypoventilation: Secondary | ICD-10-CM

## 2013-11-30 DIAGNOSIS — F418 Other specified anxiety disorders: Secondary | ICD-10-CM | POA: Insufficient documentation

## 2013-11-30 DIAGNOSIS — R7309 Other abnormal glucose: Secondary | ICD-10-CM

## 2013-11-30 DIAGNOSIS — I5022 Chronic systolic (congestive) heart failure: Secondary | ICD-10-CM

## 2013-11-30 LAB — POC HEMOCCULT BLD/STL (OFFICE/1-CARD/DIAGNOSTIC): FECAL OCCULT BLD: NEGATIVE

## 2013-11-30 LAB — COMPLETE METABOLIC PANEL WITH GFR
ALT: <8 U/L (ref 0–53)
AST: 11 U/L (ref 0–37)
Albumin: 4.2 g/dL (ref 3.5–5.2)
Alkaline Phosphatase: 107 U/L (ref 39–117)
BILIRUBIN TOTAL: 0.7 mg/dL (ref 0.2–1.2)
BUN: 30 mg/dL — AB (ref 6–23)
CO2: 26 mEq/L (ref 19–32)
CREATININE: 1.64 mg/dL — AB (ref 0.50–1.35)
Calcium: 9.9 mg/dL (ref 8.4–10.5)
Chloride: 103 mEq/L (ref 96–112)
GFR, EST NON AFRICAN AMERICAN: 39 mL/min — AB
GFR, Est African American: 45 mL/min — ABNORMAL LOW
GLUCOSE: 95 mg/dL (ref 70–99)
Potassium: 4.5 mEq/L (ref 3.5–5.3)
Sodium: 140 mEq/L (ref 135–145)
Total Protein: 7.6 g/dL (ref 6.0–8.3)

## 2013-11-30 LAB — CBC WITH DIFFERENTIAL/PLATELET
BASOS ABS: 0.1 10*3/uL (ref 0.0–0.1)
BASOS PCT: 1 % (ref 0–1)
EOS ABS: 0.3 10*3/uL (ref 0.0–0.7)
Eosinophils Relative: 4 % (ref 0–5)
HCT: 37.9 % — ABNORMAL LOW (ref 39.0–52.0)
HEMOGLOBIN: 12.8 g/dL — AB (ref 13.0–17.0)
LYMPHS PCT: 25 % (ref 12–46)
Lymphs Abs: 1.6 10*3/uL (ref 0.7–4.0)
MCH: 29.5 pg (ref 26.0–34.0)
MCHC: 33.8 g/dL (ref 30.0–36.0)
MCV: 87.3 fL (ref 78.0–100.0)
MONO ABS: 0.6 10*3/uL (ref 0.1–1.0)
MPV: 9.8 fL (ref 9.4–12.4)
Monocytes Relative: 9 % (ref 3–12)
Neutro Abs: 3.9 10*3/uL (ref 1.7–7.7)
Neutrophils Relative %: 61 % (ref 43–77)
PLATELETS: 287 10*3/uL (ref 150–400)
RBC: 4.34 MIL/uL (ref 4.22–5.81)
RDW: 14.6 % (ref 11.5–15.5)
WBC: 6.4 10*3/uL (ref 4.0–10.5)

## 2013-11-30 LAB — LIPID PANEL
CHOL/HDL RATIO: 5 ratio
Cholesterol: 161 mg/dL (ref 0–200)
HDL: 32 mg/dL — AB (ref >39)
LDL CALC: 103 mg/dL — AB (ref 0–99)
Triglycerides: 132 mg/dL (ref <150)
VLDL: 26 mg/dL (ref 0–40)

## 2013-11-30 MED ORDER — MIRTAZAPINE 15 MG PO TABS: 15.0000 mg | ORAL_TABLET | Freq: Every day | ORAL | Status: DC

## 2013-11-30 MED ORDER — PENICILLIN V POTASSIUM 500 MG PO TABS: 500.0000 mg | ORAL_TABLET | Freq: Three times a day (TID) | ORAL | Status: DC

## 2013-11-30 NOTE — Patient Instructions (Addendum)
Annual wellness in 2 month, call if you need me before  cXR , and labs today, CBC and diff, cmp and EGFR, hBA1C, lipids  Pen V is prescribed for 1 week due to c/o 1 month of cough  Come first week in December for flu vaccine  You are referred to cardiology due to new chest pain and faTIGUE   New for depression is remron , one at bedtime

## 2013-11-30 NOTE — Progress Notes (Signed)
   Subjective:    Patient ID: [name] Macias, male    DOB: 10-Jun-1932, 80 y.o.   MRN: [id number]  HPI Pt in with c/o cough productive of yellow sputum for 1 month, he has fatigue , poor appetite and does not feel well. CXR ordered has npot been done, he denies sinus pressure or post nasal drainage. Spouse notes increased anxiety, depression , poor appetite and poor sleep, he is not suicidal or homicidal, but is increasingly withdrawn  C/o poor exercise tolerance and increased fatigue, intermittent  chest pain, denies palpitations , pND or orthopnea,  No leg edema   Review of Systems See HPI Denies recent fever has had intermittent chills.  Denies abdominal pain, nausea, vomiting,diarrhea or constipation.   Denies dysuria, frequency, hesitancy or incontinence. Denies joint pain, swelling and limitation in mobility. Denies headaches, seizures, numbness, or tingling.  Denies skin break down or rash.        Objective:   Physical Exam BP 120/74 mmHg  Pulse 78  Resp 16  Ht 5\' 5"  (1.651 m)  Wt 167 lb (75.751 kg)  BMI 27.79 kg/m2  SpO2 98% Patient alert and oriented and in no cardiopulmonary distress.Ill appearing   HEENT: No facial asymmetry, EOMI, no sinus tenderness, TM clear bilaterally good light reflex, oropharynx moist, no exudate, neck decreased ROM Chest:decreased though adequate  air entry bilaterally, few crackles, no wheezes   CVS: S1, S2 systolic  murmurs, no S3.Regular rate.  ABD: Soft non tender. No organomegaly or mass, normal bowel sounds Rectal:  No  mass heme negative stool  Ext: No edema  MS: Adequate though reduced  ROM spine, shoulders, hips and knees.  Skin: Intact, no ulcerations or rash noted.  Psych: Good eye contact, flat affect. Memory mildly im[paired,  depressed appearing.  CNS: CN 2-12 intact, power,  normal throughout.no focal deficits noted.        Assessment & Plan:  /Chronic bronchitis Penicillin prescribed and pt to obtain CXR which  had previously been ordered  Depression with anxiety Start remron for help with sleep, depression, anxiety and appetite , f/u in 2 months, pt not suicidal or homicidal Mental staus has deteriorated sincedx of prostate cancer per spouse, no treatment at this time , just observation per urology  HTN (hypertension) Controlled, no change in medication DASH diet and commitment to daily physical activity for a minimum of 30 minutes discussed and encouraged, as a part of hypertension management. The importance of attaining a healthy weight is also discussed.   Chronic systolic heart failure C/o increased exertional fatigiue, needs cardiology re eval, referral entered  GERD (gastroesophageal reflux disease) Controlled, no change in medication   Nocturnal hypoxia Importance of use of supplemenytal oxygen during sleep is stressed  Special screening for malignant neoplasms, colon Rectal exam no mass and heme negative stool

## 2013-12-01 LAB — HEMOGLOBIN A1C
Hgb A1c MFr Bld: 6.2 % — ABNORMAL HIGH (ref <5.7)
MEAN PLASMA GLUCOSE: 131 mg/dL — AB (ref <117)

## 2013-12-03 ENCOUNTER — Encounter: Payer: Self-pay | Admitting: *Deleted

## 2013-12-15 ENCOUNTER — Ambulatory Visit (INDEPENDENT_AMBULATORY_CARE_PROVIDER_SITE_OTHER): Payer: Commercial Managed Care - HMO | Admitting: *Deleted

## 2013-12-15 ENCOUNTER — Encounter: Payer: Self-pay | Admitting: Physician Assistant

## 2013-12-15 ENCOUNTER — Ambulatory Visit (INDEPENDENT_AMBULATORY_CARE_PROVIDER_SITE_OTHER): Payer: Commercial Managed Care - HMO | Admitting: Physician Assistant

## 2013-12-15 VITALS — BP 138/84 | HR 72 | Ht 66.0 in | Wt 169.0 lb

## 2013-12-15 DIAGNOSIS — R079 Chest pain, unspecified: Secondary | ICD-10-CM | POA: Insufficient documentation

## 2013-12-15 DIAGNOSIS — Z23 Encounter for immunization: Secondary | ICD-10-CM

## 2013-12-15 DIAGNOSIS — I429 Cardiomyopathy, unspecified: Secondary | ICD-10-CM

## 2013-12-15 DIAGNOSIS — J42 Unspecified chronic bronchitis: Secondary | ICD-10-CM

## 2013-12-15 DIAGNOSIS — I10 Essential (primary) hypertension: Secondary | ICD-10-CM

## 2013-12-15 DIAGNOSIS — I428 Other cardiomyopathies: Secondary | ICD-10-CM

## 2013-12-15 NOTE — Patient Instructions (Signed)
Your physician recommends that you schedule a follow-up appointment in: [place]  Your physician recommends that you continue on your current medications as directed. Please refer to the Current Medication list given to you today.  PLEASE CALL OUR OFFICE IF HAVE MORE CHEST PAIN  Thank you for choosing [facility]!!

## 2013-12-15 NOTE — Assessment & Plan Note (Signed)
Recent upper respiratory infection treated and resolved. Patient is requesting flu shot today. We have given it.

## 2013-12-15 NOTE — Assessment & Plan Note (Signed)
Patient has a history of a nonischemic cardiomyopathy dating back to 2004. EF 15-20%. Last echo in 2011. Patient has no symptoms of congestive heart failure. Continue current therapy of Coreg, Lasix, losartan, and Aldactone.

## 2013-12-15 NOTE — Progress Notes (Signed)
HPI: This is a 80 year old male patient Dr. Crissie Sickles who has a nonischemic cardiomyopathy ejection fraction 15% to 90%, chronic systolic CHF, V. tach status post ICD implant. He was last seen by Dr. Lovena Le in 10/2012 at which time he was doing well. Cardiac catheterization in 2004 showed nonobstructive CAD with a proximal diffuse 40% stenosis in the RCA otherwise no focal lesions. Last defibrillator check was in March 2015. Last 2-D echo 2011.  Patient comes in today for yearly follow-up. He says last week when laying in bed he developed left-sided chest tightness relieved with one nitroglycerin. This is the first time he has had chest pain in a very long time. He also uses oxygen at night but wasn't wearing it at the time he had his chest pain. He denies any radiation of the pain, associated shortness of breath, diaphoresis, dizziness, or presyncope. He is extremely active push mowing his lawn, working in the garden and is outside all day. He never has exertional chest pain or shortness of breath. He also had a cold with significant cough treated with an antibiotic over the past 2 weeks. He is now feeling much better and asking for his flu shot. He has had no symptoms of heart failure, edema, weight gain, palpitations and his defibrillator has not gone off.  Allergies  Allergen Reactions  . Codeine Other (See Comments)    Whelps up  . Enalapril Maleate Other (See Comments)    Whelps up  . Sulfonamide Derivatives Other (See Comments)    Whelps up     Current Outpatient Prescriptions  Medication Sig Dispense Refill  . acetaminophen (TYLENOL) 500 MG tablet Take 1,000 mg by mouth daily as needed for moderate pain.     Marland Kitchen allopurinol (ZYLOPRIM) 100 MG tablet Take 1 tablet (100 mg total) by mouth daily. 90 tablet 1  . aspirin (ASPIRIN LOW DOSE) 81 MG EC tablet Take 81 mg by mouth daily.      . carvedilol (COREG) 25 MG tablet Take 1 tablet (25 mg total) by mouth 2 (two) times daily  with a meal. 180 tablet 1  . chlorpheniramine-HYDROcodone (TUSSIONEX) 10-8 MG/5ML LQCR     . furosemide (LASIX) 40 MG tablet Take 1 tablet (40 mg total) by mouth 2 (two) times daily. 180 tablet 1  . losartan (COZAAR) 25 MG tablet Take 1 tablet (25 mg total) by mouth daily. 90 tablet 1  . mirtazapine (REMERON) 15 MG tablet Take 1 tablet (15 mg total) by mouth at bedtime. 30 tablet 3  . nitroGLYCERIN (NITROSTAT) 0.4 MG SL tablet Place 1 tablet (0.4 mg total) under the tongue every 5 (five) minutes as needed. For chest pain 30 tablet 0  . NON FORMULARY 2 mLs. Oxygen. Daily at night and PRN during the day.     Marland Kitchen omeprazole (PRILOSEC) 20 MG capsule Take 1 capsule (20 mg total) by mouth daily. 30 capsule 3  . penicillin v potassium (VEETID) 500 MG tablet Take 1 tablet (500 mg total) by mouth 3 (three) times daily. 21 tablet 0  . spironolactone (ALDACTONE) 25 MG tablet Take 1 tablet (25 mg total) by mouth daily. 90 tablet 1  . temazepam (RESTORIL) 30 MG capsule Take 1 capsule (30 mg total) by mouth at bedtime as needed for sleep. 90 capsule 1  . traMADol (ULTRAM) 50 MG tablet Take 1 tablet (50 mg total) by mouth every 6 (six) hours as needed. 90 tablet 1   No current facility-administered  medications for this visit.    Past Medical History  Diagnosis Date  . Arthritis   . Gout   . Non-ischemic cardiomyopathy   . Hypertension   . Arthritis     left foot   . CAD (coronary artery disease) of artery bypass graft   . COPD (chronic obstructive pulmonary disease)   . History of syphilis   . Type 2 HSV infection of penis   . Erectile dysfunction   . Anxiety disorder   . Dementia   . Anxiety   . Angina   . GERD (gastroesophageal reflux disease)     Past Surgical History  Procedure Laterality Date  . Left ankle      Not Done by Dr. Aline Brochure   . S/p  biventricular pacemaker / aicd implantation  02/19/05  . Defribrillator implant    . Crtd upgrade  12/14/2009  . Transthoracic echocardiogram   2011  . Doppler echocardiography  2009  . Insert / replace / remove pacemaker    . Esophagogastroduodenoscopy N/A 07/01/2013    Procedure: ESOPHAGOGASTRODUODENOSCOPY (EGD);  Surgeon: Rogene Houston, MD;  Location: [facility];  Service: Endoscopy;  Laterality: N/A;  145-moved to 1245 Ann notfied pt    Family History  Problem Relation Age of Onset  . Heart failure Father   . Prostate cancer Brother   . Diabetes Brother   . Hypertension Brother   . Heart failure Brother     History   Social History  . Marital Status: Married    Spouse Name: N/A    Number of Children: 3  . Years of Education: N/A   Occupational History  . disabled     Social History Main Topics  . Smoking status: Former Smoker -- 1.00 packs/day for 10 years    Types: Cigarettes    Quit date: 01/15/1963  . Smokeless tobacco: Never Used  . Alcohol Use: No  . Drug Use: No  . Sexual Activity: Not on file   Other Topics Concern  . Not on file   Social History Narrative    ROS: Recently treated for depression, he has chronic insomnia, See history of present illness otherwise negative  BP 138/84 mmHg  Pulse 72  Ht 5\' 6"  (1.676 m)  Wt 169 lb (76.658 kg)  BMI 27.29 kg/m2  PHYSICAL EXAM: Well-nournished, in no acute distress. Neck: No JVD, HJR, Bruit, or thyroid enlargement  Lungs: Decreased breath sounds at the bases but No tachypnea, clear without wheezing, rales, or rhonchi  Cardiovascular: RRR, PMI displaced laterally, Normal S1 and S2, positive S4 and 2/6 systolic murmur at the left sternal border, no bruit, thrill, or heave.  Abdomen: BS normal. Soft without organomegaly, masses, lesions or tenderness.  Extremities: without cyanosis, clubbing or edema. Good distal pulses bilateral  SKin: Warm, no lesions or rashes   Musculoskeletal: No deformities  Neuro: no focal signs   Wt Readings from Last 3 Encounters:  12/15/13 169 lb (76.658 kg)  11/30/13 167 lb (75.751 kg)  07/01/13 169 lb  (76.658 kg)    Lab Results  Component Value Date   WBC 6.4 11/30/2013   HGB 12.8* 11/30/2013   HCT 37.9* 11/30/2013   PLT 287 11/30/2013   GLUCOSE 95 11/30/2013   CHOL 161 11/30/2013   TRIG 132 11/30/2013   HDL 32* 11/30/2013   LDLCALC 103* 11/30/2013   ALT <8 11/30/2013   AST 11 11/30/2013   NA 140 11/30/2013   K 4.5 11/30/2013   CL  103 11/30/2013   CREATININE 1.64* 11/30/2013   BUN 30* 11/30/2013   CO2 26 11/30/2013   TSH 2.458 03/18/2013   PSA 9.03* 12/25/2011   INR 1.06 01/17/2011   HGBA1C 6.2* 11/30/2013    EKG: Normal sinus rhythm with first-degree AV block, right bundle branch block, no acute change  2-D echo 2011: Study Conclusions   - Left ventricle: The cavity size was mildly dilated. Wall thickness    was increased in a pattern of mild LVH. Systolic function was    severely reduced. The estimated ejection fraction was in the range    of 15% to 20%. Diffuse hypokinesis. There is akinesis of the    inferior myocardium. Doppler parameters are consistent with    abnormal left ventricular relaxation (grade 1 diastolic    dysfunction).  - Ventricular septum: Septal motion showed paradox.  - Aortic valve: Mildly calcified annulus. Trileaflet. Mild    regurgitation.  - Mitral valve: Trivial regurgitation.  - Left atrium: The atrium was at the upper limits of normal in size.  - Right ventricle: Pacer wire or catheter noted in right ventricle.  - Tricuspid valve: Trivial regurgitation.  - Pericardium, extracardiac: There was no pericardial effusion.  Cardiac catheterization 2004:  2. Ventriculography: Ejection fraction 15-20% with global hypokinesis.  No     significant mitral regurgitation.    SELECTIVE CORONARY ANGIOGRAPHY:  1. The left main coronary artery is a large caliber vessels.  No flow-     limiting coronary artery disease  2. The left anterior descending artery is a large caliber vessel without     flow-limiting disease, including diagnosis  branches.  3. The circumflex coronary artery again is a large caliber vessel with no     flow-limiting disease.  4. The right coronary artery had a proximal diffuse 40% stenosis, somewhat     improved after injection of intracoronary nitroglycerin.  There were no     other focal lesions.    RECOMMENDATIONS:  Further medical therapy is appropriate for nonischemic  cardiomyopathy.  This would include ACE inhibitor, add digoxin and beta-  blocker therapy.  The patient's volume status is currently euvolemic and  Lasix will be held today.  We anticipate that he can be discharged tomorrow.  Dr. Berdine Addison had been notified and will continue to provide followup care.                                            Ernestine Mcmurray, M.D.      GED/MEDQ  D:  07/01/2002  T:  07/02/2002  Job:  [id number]

## 2013-12-15 NOTE — Assessment & Plan Note (Signed)
Blood pressure controlled. 

## 2013-12-15 NOTE — Assessment & Plan Note (Signed)
Patient had an episode of chest pain that awakened him from sleep relieved with one sublingual nitroglycerin. He was not wearing his oxygen at the time. This is his only episode. He is very active and has no exertional symptoms. Last cardiac catheterization was in 2004 and  showed nonobstructive CAD. He has never had a stress test. I discussed further workup with possible stress Myoview but the patient would like to hold off at this time. He says he's feeling well and is able to do everything he wants to do without any problems. He says he will call if he has any further chest pain. Follow-up with Dr. Lovena Le with a defibrillator check in March.

## 2014-01-24 ENCOUNTER — Emergency Department (HOSPITAL_COMMUNITY): Payer: Commercial Managed Care - HMO

## 2014-01-24 ENCOUNTER — Encounter (HOSPITAL_COMMUNITY): Payer: Self-pay | Admitting: Emergency Medicine

## 2014-01-24 ENCOUNTER — Inpatient Hospital Stay (HOSPITAL_COMMUNITY)
Admission: EM | Admit: 2014-01-24 | Discharge: 2014-01-25 | DRG: 281 | Disposition: A | Discharge status: Home / Self Care | Payer: Commercial Managed Care - HMO | Attending: Internal Medicine | Admitting: Internal Medicine

## 2014-01-24 ENCOUNTER — Inpatient Hospital Stay (HOSPITAL_COMMUNITY): Payer: Commercial Managed Care - HMO

## 2014-01-24 DIAGNOSIS — Z7982 Long term (current) use of aspirin: Secondary | ICD-10-CM

## 2014-01-24 DIAGNOSIS — N529 Male erectile dysfunction, unspecified: Secondary | ICD-10-CM | POA: Diagnosis present

## 2014-01-24 DIAGNOSIS — Z95810 Presence of automatic (implantable) cardiac defibrillator: Secondary | ICD-10-CM | POA: Diagnosis not present

## 2014-01-24 DIAGNOSIS — F419 Anxiety disorder, unspecified: Secondary | ICD-10-CM | POA: Diagnosis present

## 2014-01-24 DIAGNOSIS — I214 Non-ST elevation (NSTEMI) myocardial infarction: Secondary | ICD-10-CM

## 2014-01-24 DIAGNOSIS — K219 Gastro-esophageal reflux disease without esophagitis: Secondary | ICD-10-CM | POA: Diagnosis present

## 2014-01-24 DIAGNOSIS — R911 Solitary pulmonary nodule: Secondary | ICD-10-CM | POA: Diagnosis present

## 2014-01-24 DIAGNOSIS — I7121 Aneurysm of the ascending aorta, without rupture: Secondary | ICD-10-CM | POA: Diagnosis present

## 2014-01-24 DIAGNOSIS — I2510 Atherosclerotic heart disease of native coronary artery without angina pectoris: Secondary | ICD-10-CM | POA: Diagnosis present

## 2014-01-24 DIAGNOSIS — R109 Unspecified abdominal pain: Secondary | ICD-10-CM

## 2014-01-24 DIAGNOSIS — I10 Essential (primary) hypertension: Secondary | ICD-10-CM | POA: Diagnosis present

## 2014-01-24 DIAGNOSIS — J449 Chronic obstructive pulmonary disease, unspecified: Secondary | ICD-10-CM | POA: Diagnosis present

## 2014-01-24 DIAGNOSIS — Z87891 Personal history of nicotine dependence: Secondary | ICD-10-CM | POA: Diagnosis not present

## 2014-01-24 DIAGNOSIS — R1084 Generalized abdominal pain: Secondary | ICD-10-CM

## 2014-01-24 DIAGNOSIS — I428 Other cardiomyopathies: Secondary | ICD-10-CM

## 2014-01-24 DIAGNOSIS — R079 Chest pain, unspecified: Secondary | ICD-10-CM | POA: Diagnosis present

## 2014-01-24 DIAGNOSIS — I420 Dilated cardiomyopathy: Secondary | ICD-10-CM | POA: Diagnosis present

## 2014-01-24 DIAGNOSIS — I712 Thoracic aortic aneurysm, without rupture: Secondary | ICD-10-CM | POA: Diagnosis present

## 2014-01-24 DIAGNOSIS — I5022 Chronic systolic (congestive) heart failure: Secondary | ICD-10-CM | POA: Diagnosis present

## 2014-01-24 DIAGNOSIS — R0600 Dyspnea, unspecified: Secondary | ICD-10-CM | POA: Diagnosis present

## 2014-01-24 DIAGNOSIS — I429 Cardiomyopathy, unspecified: Secondary | ICD-10-CM

## 2014-01-24 DIAGNOSIS — M19072 Primary osteoarthritis, left ankle and foot: Secondary | ICD-10-CM | POA: Diagnosis present

## 2014-01-24 DIAGNOSIS — Z882 Allergy status to sulfonamides status: Secondary | ICD-10-CM | POA: Diagnosis not present

## 2014-01-24 DIAGNOSIS — A539 Syphilis, unspecified: Secondary | ICD-10-CM | POA: Diagnosis present

## 2014-01-24 DIAGNOSIS — M109 Gout, unspecified: Secondary | ICD-10-CM | POA: Diagnosis present

## 2014-01-24 DIAGNOSIS — F0390 Unspecified dementia without behavioral disturbance: Secondary | ICD-10-CM | POA: Diagnosis present

## 2014-01-24 DIAGNOSIS — Z885 Allergy status to narcotic agent status: Secondary | ICD-10-CM

## 2014-01-24 HISTORY — DX: Non-ST elevation (NSTEMI) myocardial infarction: I21.4

## 2014-01-24 HISTORY — DX: Solitary pulmonary nodule: R91.1

## 2014-01-24 HISTORY — DX: Aneurysm of the ascending aorta, without rupture: I71.21

## 2014-01-24 HISTORY — DX: Thoracic aortic aneurysm, without rupture: I71.2

## 2014-01-24 LAB — BRAIN NATRIURETIC PEPTIDE: B Natriuretic Peptide: 1108 pg/mL — ABNORMAL HIGH (ref 0.0–100.0)

## 2014-01-24 LAB — COMPREHENSIVE METABOLIC PANEL
ALT: 21 U/L (ref 0–53)
ANION GAP: 9 (ref 5–15)
AST: 22 U/L (ref 0–37)
Albumin: 4 g/dL (ref 3.5–5.2)
Alkaline Phosphatase: 101 U/L (ref 39–117)
BUN: 25 mg/dL — AB (ref 6–23)
CO2: 25 mmol/L (ref 19–32)
Calcium: 9.4 mg/dL (ref 8.4–10.5)
Chloride: 109 mEq/L (ref 96–112)
Creatinine, Ser: 1.67 mg/dL — ABNORMAL HIGH (ref 0.50–1.35)
GFR, EST AFRICAN AMERICAN: 43 mL/min — AB (ref >90)
GFR, EST NON AFRICAN AMERICAN: 37 mL/min — AB (ref >90)
Glucose, Bld: 109 mg/dL — ABNORMAL HIGH (ref 70–99)
Potassium: 4.1 mmol/L (ref 3.5–5.1)
SODIUM: 143 mmol/L (ref 135–145)
TOTAL PROTEIN: 7.2 g/dL (ref 6.0–8.3)
Total Bilirubin: 1 mg/dL (ref 0.3–1.2)

## 2014-01-24 LAB — PROTIME-INR
INR: 1.25 (ref 0.00–1.49)
PROTHROMBIN TIME: 15.8 s — AB (ref 11.6–15.2)

## 2014-01-24 LAB — I-STAT CHEM 8, ED
BUN: 23 mg/dL (ref 6–23)
Calcium, Ion: 1.19 mmol/L (ref 1.13–1.30)
Chloride: 108 mEq/L (ref 96–112)
Creatinine, Ser: 1.6 mg/dL — ABNORMAL HIGH (ref 0.50–1.35)
Glucose, Bld: 110 mg/dL — ABNORMAL HIGH (ref 70–99)
HEMATOCRIT: 40 % (ref 39.0–52.0)
HEMOGLOBIN: 13.6 g/dL (ref 13.0–17.0)
POTASSIUM: 3.9 mmol/L (ref 3.5–5.1)
Sodium: 142 mmol/L (ref 135–145)
TCO2: 21 mmol/L (ref 0–100)

## 2014-01-24 LAB — LIPASE, BLOOD: Lipase: 21 U/L (ref 11–59)

## 2014-01-24 LAB — CBC WITH DIFFERENTIAL/PLATELET
Basophils Absolute: 0 10*3/uL (ref 0.0–0.1)
Basophils Relative: 1 % (ref 0–1)
Eosinophils Absolute: 0.1 10*3/uL (ref 0.0–0.7)
Eosinophils Relative: 2 % (ref 0–5)
HCT: 35.6 % — ABNORMAL LOW (ref 39.0–52.0)
HEMOGLOBIN: 11.7 g/dL — AB (ref 13.0–17.0)
Lymphocytes Relative: 22 % (ref 12–46)
Lymphs Abs: 1.4 10*3/uL (ref 0.7–4.0)
MCH: 30.3 pg (ref 26.0–34.0)
MCHC: 32.9 g/dL (ref 30.0–36.0)
MCV: 92.2 fL (ref 78.0–100.0)
Monocytes Absolute: 0.5 10*3/uL (ref 0.1–1.0)
Monocytes Relative: 7 % (ref 3–12)
Neutro Abs: 4.4 10*3/uL (ref 1.7–7.7)
Neutrophils Relative %: 68 % (ref 43–77)
PLATELETS: 255 10*3/uL (ref 150–400)
RBC: 3.86 MIL/uL — ABNORMAL LOW (ref 4.22–5.81)
RDW: 14.2 % (ref 11.5–15.5)
WBC: 6.4 10*3/uL (ref 4.0–10.5)

## 2014-01-24 LAB — I-STAT TROPONIN, ED
TROPONIN I, POC: 0.1 ng/mL — AB (ref 0.00–0.08)
Troponin i, poc: 0.09 ng/mL (ref 0.00–0.08)

## 2014-01-24 LAB — TSH: TSH: 1.933 u[IU]/mL (ref 0.350–4.500)

## 2014-01-24 LAB — TROPONIN I
TROPONIN I: 0.13 ng/mL — AB (ref <0.031)
TROPONIN I: 0.09 ng/mL — AB (ref <0.031)
Troponin I: 0.12 ng/mL — ABNORMAL HIGH (ref <0.031)

## 2014-01-24 LAB — MRSA PCR SCREENING: MRSA by PCR: NEGATIVE

## 2014-01-24 MED ORDER — FUROSEMIDE 10 MG/ML IJ SOLN
40.0000 mg | Freq: Once | INTRAMUSCULAR | Status: AC
  Administered 2014-01-24: 40 mg via INTRAVENOUS
  Filled 2014-01-24: qty 4

## 2014-01-24 MED ORDER — TRAMADOL HCL 50 MG PO TABS
50.0000 mg | ORAL_TABLET | Freq: Four times a day (QID) | ORAL | Status: DC | PRN
  Administered 2014-01-25: 50 mg via ORAL
  Filled 2014-01-24: qty 1

## 2014-01-24 MED ORDER — FENTANYL CITRATE 0.05 MG/ML IJ SOLN
50.0000 ug | INTRAMUSCULAR | Status: DC | PRN
  Administered 2014-01-24 (×3): 50 ug via INTRAVENOUS
  Filled 2014-01-24 (×3): qty 2

## 2014-01-24 MED ORDER — NITROGLYCERIN IN D5W 200-5 MCG/ML-% IV SOLN
5.0000 ug/min | INTRAVENOUS | Status: DC
  Administered 2014-01-24: 5 ug/min via INTRAVENOUS

## 2014-01-24 MED ORDER — FENTANYL CITRATE 0.05 MG/ML IJ SOLN
50.0000 ug | Freq: Once | INTRAMUSCULAR | Status: AC
  Administered 2014-01-24: 50 ug via INTRAVENOUS
  Filled 2014-01-24: qty 2

## 2014-01-24 MED ORDER — ASPIRIN EC 81 MG PO TBEC
81.0000 mg | DELAYED_RELEASE_TABLET | Freq: Every day | ORAL | Status: DC
  Administered 2014-01-25: 81 mg via ORAL
  Filled 2014-01-24: qty 1

## 2014-01-24 MED ORDER — HEPARIN BOLUS VIA INFUSION
4000.0000 [IU] | Freq: Once | INTRAVENOUS | Status: AC
  Administered 2014-01-24: 4000 [IU] via INTRAVENOUS

## 2014-01-24 MED ORDER — SPIRONOLACTONE 25 MG PO TABS
25.0000 mg | ORAL_TABLET | Freq: Every day | ORAL | Status: DC
  Administered 2014-01-25: 25 mg via ORAL
  Filled 2014-01-24: qty 1

## 2014-01-24 MED ORDER — ACETAMINOPHEN 325 MG PO TABS
650.0000 mg | ORAL_TABLET | ORAL | Status: DC | PRN
  Administered 2014-01-25: 650 mg via ORAL
  Filled 2014-01-24: qty 2

## 2014-01-24 MED ORDER — HEPARIN (PORCINE) IN NACL 100-0.45 UNIT/ML-% IJ SOLN
1050.0000 [IU]/h | INTRAMUSCULAR | Status: DC
  Administered 2014-01-24: 900 [IU]/h via INTRAVENOUS
  Filled 2014-01-24: qty 250

## 2014-01-24 MED ORDER — PANTOPRAZOLE SODIUM 40 MG PO TBEC
40.0000 mg | DELAYED_RELEASE_TABLET | Freq: Every day | ORAL | Status: DC
Start: 2014-01-24 — End: 2014-01-25
  Administered 2014-01-25: 40 mg via ORAL
  Filled 2014-01-24: qty 1

## 2014-01-24 MED ORDER — CHLORHEXIDINE GLUCONATE CLOTH 2 % EX PADS: 6.0000 | MEDICATED_PAD | Freq: Every day | CUTANEOUS | Status: DC

## 2014-01-24 MED ORDER — ONDANSETRON HCL 4 MG/2ML IJ SOLN: 4.0000 mg | Freq: Four times a day (QID) | INTRAMUSCULAR | Status: DC | PRN

## 2014-01-24 MED ORDER — ALLOPURINOL 100 MG PO TABS
100.0000 mg | ORAL_TABLET | Freq: Every day | ORAL | Status: DC
  Administered 2014-01-25: 100 mg via ORAL
  Filled 2014-01-24: qty 1

## 2014-01-24 MED ORDER — NITROGLYCERIN 0.4 MG SL SUBL
0.4000 mg | SUBLINGUAL_TABLET | SUBLINGUAL | Status: DC | PRN
  Administered 2014-01-24: 0.4 mg via SUBLINGUAL
  Filled 2014-01-24: qty 1

## 2014-01-24 MED ORDER — NITROGLYCERIN IN D5W 200-5 MCG/ML-% IV SOLN
INTRAVENOUS | Status: AC
  Administered 2014-01-24: 5 ug/min via INTRAVENOUS
  Filled 2014-01-24: qty 250

## 2014-01-24 MED ORDER — CARVEDILOL 25 MG PO TABS
25.0000 mg | ORAL_TABLET | Freq: Two times a day (BID) | ORAL | Status: DC
  Administered 2014-01-25: 25 mg via ORAL
  Filled 2014-01-24: qty 1

## 2014-01-24 MED ORDER — ASPIRIN 81 MG PO CHEW
324.0000 mg | CHEWABLE_TABLET | Freq: Once | ORAL | Status: AC
  Administered 2014-01-24: 324 mg via ORAL
  Filled 2014-01-24: qty 4

## 2014-01-24 NOTE — ED Notes (Signed)
Patient states "I have been shortwinded for about 2 weeks and woke up this morning and couldn't catch my breath." Patient also states he was vomiting 2 days ago and abdominal pain "where my hernia is. I can't button my pants because it hurts so bad."

## 2014-01-24 NOTE — ED Provider Notes (Signed)
TIME SEEN: 1:11 PM  CHIEF COMPLAINT: Chest pain, shortness of breath  HPI: Pt is a 81 y.o. M with history of dilated cardiomyopathy status post pacemaker/defibrillator, hypertension, dementia who presents emergency department with complaints of shortness of breath the past 2 weeks worse with exertion and lying flat. He has now developed chest tightness without radiation today and that prompted him to come to the emergency department. No diaphoresis, nausea or vomiting. He also complains of abdominal pain but states is from a chronic hernia. No diarrhea. No dysuria or hematuria.  ROS: See HPI, history is limited as patient is a poor historian Constitutional: no fever  Eyes: no drainage  ENT: no runny nose   Cardiovascular:  chest pain  Resp: SOB  GI: no vomiting GU: no dysuria Integumentary: no rash  Allergy: no hives  Musculoskeletal: no leg swelling  Neurological: no slurred speech ROS otherwise negative  PAST MEDICAL HISTORY/PAST SURGICAL HISTORY:  Past Medical History  Diagnosis Date  . Arthritis   . Gout   . Non-ischemic cardiomyopathy   . Hypertension   . Arthritis     left foot   . CAD (coronary artery disease) of artery bypass graft   . COPD (chronic obstructive pulmonary disease)   . History of syphilis   . Type 2 HSV infection of penis   . Erectile dysfunction   . Anxiety disorder   . Dementia   . Anxiety   . Angina   . GERD (gastroesophageal reflux disease)     MEDICATIONS:  Prior to Admission medications   Medication Sig Start Date End Date Taking? Authorizing Provider  acetaminophen (TYLENOL) 500 MG tablet Take 1,000 mg by mouth daily as needed for moderate pain.     Historical Provider, MD  allopurinol (ZYLOPRIM) 100 MG tablet Take 1 tablet (100 mg total) by mouth daily. 11/03/13   Fayrene Helper, MD  aspirin (ASPIRIN LOW DOSE) 81 MG EC tablet Take 81 mg by mouth daily.      Historical Provider, MD  carvedilol (COREG) 25 MG tablet Take 1 tablet (25 mg  total) by mouth 2 (two) times daily with a meal. 11/03/13   Fayrene Helper, MD  chlorpheniramine-HYDROcodone ([place]) 10-8 MG/5ML [facility]  11/18/13   Historical Provider, MD  furosemide (LASIX) 40 MG tablet Take 1 tablet (40 mg total) by mouth 2 (two) times daily. 11/03/13   Fayrene Helper, MD  losartan (COZAAR) 25 MG tablet Take 1 tablet (25 mg total) by mouth daily. 11/03/13   Fayrene Helper, MD  mirtazapine (REMERON) 15 MG tablet Take 1 tablet (15 mg total) by mouth at bedtime. 11/30/13   Fayrene Helper, MD  nitroGLYCERIN (NITROSTAT) 0.4 MG SL tablet Place 1 tablet (0.4 mg total) under the tongue every 5 (five) minutes as needed. For chest pain 07/06/13   Fayrene Helper, MD  NON FORMULARY 2 mLs. Oxygen. Daily at night and PRN during the day.     Historical Provider, MD  omeprazole (PRILOSEC) 20 MG capsule Take 1 capsule (20 mg total) by mouth daily. 10/11/13   Butch Penny, NP  penicillin v potassium (VEETID) 500 MG tablet Take 1 tablet (500 mg total) by mouth 3 (three) times daily. 11/30/13   Fayrene Helper, MD  spironolactone (ALDACTONE) 25 MG tablet Take 1 tablet (25 mg total) by mouth daily. 11/03/13   Fayrene Helper, MD  temazepam (RESTORIL) 30 MG capsule Take 1 capsule (30 mg total) by mouth at bedtime as needed  for sleep. 11/29/13 05/30/14  Fayrene Helper, MD  traMADol (ULTRAM) 50 MG tablet Take 1 tablet (50 mg total) by mouth every 6 (six) hours as needed. 11/29/13   Fayrene Helper, MD    ALLERGIES:  Allergies  Allergen Reactions  . Codeine Other (See Comments)    Whelps up  . Enalapril Maleate Other (See Comments)    Whelps up  . Sulfonamide Derivatives Other (See Comments)    Whelps up    SOCIAL HISTORY:  History  Substance Use Topics  . Smoking status: Former Smoker -- 1.00 packs/day for 10 years    Types: Cigarettes    Quit date: 01/15/1963  . Smokeless tobacco: Never Used  . Alcohol Use: No    FAMILY HISTORY: Family History   Problem Relation Age of Onset  . Heart failure Father   . Prostate cancer Brother   . Diabetes Brother   . Hypertension Brother   . Heart failure Brother     EXAM: BP 139/79 mmHg  Pulse 81  Temp(Src) 97.6 F (36.4 C) (Oral)  Resp 35  Ht 5\' 3"  (1.6 m)  Wt 176 lb (79.833 kg)  BMI 31.18 kg/m2  SpO2 94% CONSTITUTIONAL: Alert and oriented and responds appropriately to questions. Appears uncomfortable but is not in any distress, elderly HEAD: Normocephalic EYES: Conjunctivae clear, PERRL ENT: normal nose; no rhinorrhea; moist mucous membranes; pharynx without lesions noted NECK: Supple, no meningismus, no LAD; no JVD appreciated when sitting upright in bed  CARD: RRR; S1 and S2 appreciated; no murmurs, no clicks, no rubs, no gallops RESP: Normal chest excursion without splinting; breath sounds clear and equal bilaterally; no wheezes, no rhonchi, no rales, no respiratory distress or hypoxia patient is mildly tachypneic but speaking full sentences ABD/GI: Normal bowel sounds; non-distended; soft, no rebound, no guarding, no large hernia appreciated, mildly tender to palpation diffusely, no pulsatile mass, no peritoneal signs BACK:  The back appears normal and is non-tender to palpation, there is no CVA tenderness EXT: Normal ROM in all joints; non-tender to palpation; no edema; normal capillary refill; no cyanosis; equal pulses in all 4 extremities; no calf tenderness or swelling SKIN: Normal color for age and race; warm NEURO: Moves all extremities equally; sensation to light touch intact diffusely PSYCH: The patient's mood and manner are appropriate. Grooming and personal hygiene are appropriate.  MEDICAL DECISION MAKING: Patient here with chest pain and shortness of breath. He has a history of dilated cardiomyopathy, hypertension. EKG shows intraventricular conduction delay similar to prior EKG in December 2015. I do not think that he is having an acute STEMI.  We'll give aspirin,  nitroglycerin and obtain cardiac labs, chest x-ray. I feel patient will need admission. He does not appear significantly volume overloaded on exam.  ED PROGRESS: Patient does have a mildly elevated troponin which may be secondary to his cardiomyopathy and his BNP is mildly elevated but again does not have significant volume overload on exam. Will give 40 mg of IV Lasix in the ED however given he is dyspneic, has orthopnea and is tachypneic. Patient's chest pain improves with nitroglycerin. We'll start nitroglycerin drip and heparin. He is complaining of abdominal pain which improves with fentanyl that seems to be from his prior history of hernias. Abdominal exam is otherwise benign, nonsurgical. Discussed with Dr. Debara Pickett with cardiology at [facility] who agrees to set the patient for transfer for her cardiac evaluation for NSTEMI.  Discussed with patient and family who agree with plan.  EKG Interpretation  Date/Time:  Monday January 24 2014 12:31:21 EST Ventricular Rate:  88 PR Interval:  196 QRS Duration: 160 QT Interval:  469 QTC Calculation: 567 R Axis:   -73 Text Interpretation:  Sinus rhythm Multiple premature complexes, vent  IVCD, consider atypical RBBB Inferior infarct, acute Anterior infarct, old Lateral leads are also involved No significant change since December 2015 Confirmed by Chrystie Hagwood,  DO, Raijon Lindfors [phone]) on 01/24/2014 4:17:19 PM      CRITICAL CARE Performed by: Nyra Jabs   Total critical care time: 45 minutes  Critical care time was exclusive of separately billable procedures and treating other patients.  Critical care was necessary to treat or prevent imminent or life-threatening deterioration.  Critical care was time spent personally by me on the following activities: development of treatment plan with patient and/or surrogate as well as nursing, discussions with consultants, evaluation of patient's response to treatment, examination of patient, obtaining  history from patient or surrogate, ordering and performing treatments and interventions, ordering and review of laboratory studies, ordering and review of radiographic studies, pulse oximetry and re-evaluation of patient's condition.    [place], DO 01/24/14 1807

## 2014-01-24 NOTE — ED Notes (Addendum)
Third SL nitro admin. Pt reports "i am feeling more comfortable."

## 2014-01-24 NOTE — ED Notes (Signed)
Second SL nitro given.

## 2014-01-24 NOTE — Progress Notes (Signed)
ANTICOAGULATION CONSULT NOTE - Initial Consult  Pharmacy Consult for Heparin Indication: chest pain/ACS  Allergies  Allergen Reactions  . Codeine Other (See Comments)    Whelps up  . Enalapril Maleate Other (See Comments)    Whelps up  . Sulfonamide Derivatives Other (See Comments)    Whelps up    Patient Measurements: Height: 5\' 3"  (160 cm) Weight: 176 lb (79.833 kg) IBW/kg (Calculated) : 56.9 Heparin Dosing Weight: 73.7 kg  Vital Signs: Temp: 98 F (36.7 C) (01/11 1541) Temp Source: Oral (01/11 1541) BP: 114/74 mmHg (01/11 1530) Pulse Rate: 73 (01/11 1530)  Labs:  Recent Labs  01/24/14 1235 01/24/14 1243  HGB 11.7* 13.6  HCT 35.6* 40.0  PLT 255  --   CREATININE 1.67* 1.60*  TROPONINI 0.13*  --     Estimated Creatinine Clearance: 35 mL/min (by C-G formula based on Cr of 1.6).   Medical History: Past Medical History  Diagnosis Date  . Arthritis   . Gout   . Non-ischemic cardiomyopathy   . Hypertension   . Arthritis     left foot   . CAD (coronary artery disease) of artery bypass graft   . COPD (chronic obstructive pulmonary disease)   . History of syphilis   . Type 2 HSV infection of penis   . Erectile dysfunction   . Anxiety disorder   . Dementia   . Anxiety   . Angina   . GERD (gastroesophageal reflux disease)     Medications:  Scheduled:    Assessment: Pt reports pain radiating to upper chest. Elevated troponin. Pharmacy protocol for NSTEMI Labs reviewed. PTA mediations reviewed Possible transfer to [facility]      Goal of Therapy:  Heparin level 0.3-0.7 units/ml Monitor platelets by anticoagulation protocol: Yes   Plan:  Give 4000 units bolus x 1 Start heparin infusion at 900 units/hr Check anti-Xa level in 8 hours and daily while on heparin Continue to monitor H&H and platelets  Abner Greenspan, Briannia Laba Bennett 01/24/2014,3:54 PM

## 2014-01-24 NOTE — H&P (Signed)
Yong Grieser is an 81 y.o. male.   Chief Complaint: Abd pain HPI:   This is a 81 year old male patient Dr. Crissie Sickles who has a nonischemic cardiomyopathy ejection fraction 15% to 19%, chronic systolic CHF, V. tach status post ICD implant. He was last seen by Dr. Lovena Le in 10/2012 at which time he was doing well. Cardiac catheterization in 2004 showed nonobstructive CAD with a proximal diffuse 40% stenosis in the RCA otherwise no focal lesions. Last defibrillator check was in March 2015. Last 2-D echo 2011.  The patient presented to [facility] hospital with Abd pain radiating to his chest.  +nausea, vomiting, SOB for two weeks with exertion and lying flat.   Last BM was yesterday and normal.   The patient currently denies nausea, vomiting, fever, chest pain, shortness of breath, orthopnea, dizziness, PND, cough, congestion, abdominal pain, hematochezia, melena, lower extremity edema, claudication.  He apparently has a history of hernia.     Past Medical History  Diagnosis Date  . Arthritis   . Gout   . Non-ischemic cardiomyopathy   . Hypertension   . Arthritis     left foot   . CAD (coronary artery disease) of artery bypass graft   . COPD (chronic obstructive pulmonary disease)   . History of syphilis   . Type 2 HSV infection of penis   . Erectile dysfunction   . Anxiety disorder   . Dementia   . Anxiety   . Angina   . GERD (gastroesophageal reflux disease)     Past Surgical History  Procedure Laterality Date  . Left ankle      Not Done by Dr. Aline Brochure   . S/p  biventricular pacemaker / aicd implantation  02/19/05  . Defribrillator implant    . Crtd upgrade  12/14/2009  . Transthoracic echocardiogram  2011  . Doppler echocardiography  2009  . Insert / replace / remove pacemaker    . Esophagogastroduodenoscopy N/A 07/01/2013    Procedure: ESOPHAGOGASTRODUODENOSCOPY (EGD);  Surgeon: Rogene Houston, MD;  Location: [facility];  Service: Endoscopy;  Laterality: N/A;  145-moved to 1245  Ann notfied pt    Family History  Problem Relation Age of Onset  . Heart failure Father   . Prostate cancer Brother   . Diabetes Brother   . Hypertension Brother   . Heart failure Brother    Social History:  reports that he quit smoking about 51 years ago. His smoking use included Cigarettes. He has a 10 pack-year smoking history. He has never used smokeless tobacco. He reports that he does not drink alcohol or use illicit drugs.  Allergies:  Allergies  Allergen Reactions  . Codeine Other (See Comments)    Whelps up  . Enalapril Maleate Other (See Comments)    Whelps up  . Sulfonamide Derivatives Other (See Comments)    Whelps up    Medications Prior to Admission  Medication Sig Dispense Refill  . acetaminophen (TYLENOL) 500 MG tablet Take 1,000 mg by mouth daily as needed for moderate pain.     Marland Kitchen allopurinol (ZYLOPRIM) 100 MG tablet Take 1 tablet (100 mg total) by mouth daily. 90 tablet 1  . Ascorbic Acid (VITAMIN C PO) Take 1 tablet by mouth daily.    Marland Kitchen aspirin (ASPIRIN LOW DOSE) 81 MG EC tablet Take 81 mg by mouth daily.      . carvedilol (COREG) 25 MG tablet Take 1 tablet (25 mg total) by mouth 2 (two) times daily with  a meal. 180 tablet 1  . chlorpheniramine-HYDROcodone (TUSSIONEX) 10-8 MG/5ML LQCR Take 5 mLs by mouth daily as needed for cough.     . Cholecalciferol (VITAMIN D PO) Take 2 tablets by mouth daily.    . furosemide (LASIX) 40 MG tablet Take 1 tablet (40 mg total) by mouth 2 (two) times daily. 180 tablet 1  . losartan (COZAAR) 25 MG tablet Take 1 tablet (25 mg total) by mouth daily. 90 tablet 1  . mirtazapine (REMERON) 15 MG tablet Take 1 tablet (15 mg total) by mouth at bedtime. 30 tablet 3  . nitroGLYCERIN (NITROSTAT) 0.4 MG SL tablet Place 1 tablet (0.4 mg total) under the tongue every 5 (five) minutes as needed. For chest pain 30 tablet 0  . omeprazole (PRILOSEC) 20 MG capsule Take 1 capsule (20 mg total) by mouth daily. 30 capsule 3  . spironolactone  (ALDACTONE) 25 MG tablet Take 1 tablet (25 mg total) by mouth daily. 90 tablet 1  . temazepam (RESTORIL) 30 MG capsule Take 1 capsule (30 mg total) by mouth at bedtime as needed for sleep. 90 capsule 1  . traMADol (ULTRAM) 50 MG tablet Take 1 tablet (50 mg total) by mouth every 6 (six) hours as needed. (Patient taking differently: Take 50 mg by mouth every 6 (six) hours as needed for moderate pain. ) 90 tablet 1  . NON FORMULARY 2 mLs. Oxygen. Daily at night and PRN during the day.     . penicillin v potassium (VEETID) 500 MG tablet Take 1 tablet (500 mg total) by mouth 3 (three) times daily. (Patient not taking: Reported on 01/24/2014) 21 tablet 0    Results for orders placed or performed during the hospital encounter of 01/24/14 (from the past 48 hour(s))  CBC with Differential     Status: Abnormal   Collection Time: 01/24/14 12:35 PM  Result Value Ref Range   WBC 6.4 4.0 - 10.5 K/uL   RBC 3.86 (L) 4.22 - 5.81 MIL/uL   Hemoglobin 11.7 (L) 13.0 - 17.0 g/dL   HCT 35.6 (L) 39.0 - 52.0 %   MCV 92.2 78.0 - 100.0 fL   MCH 30.3 26.0 - 34.0 pg   MCHC 32.9 30.0 - 36.0 g/dL   RDW 14.2 11.5 - 15.5 %   Platelets 255 150 - 400 K/uL   Neutrophils Relative % 68 43 - 77 %   Neutro Abs 4.4 1.7 - 7.7 K/uL   Lymphocytes Relative 22 12 - 46 %   Lymphs Abs 1.4 0.7 - 4.0 K/uL   Monocytes Relative 7 3 - 12 %   Monocytes Absolute 0.5 0.1 - 1.0 K/uL   Eosinophils Relative 2 0 - 5 %   Eosinophils Absolute 0.1 0.0 - 0.7 K/uL   Basophils Relative 1 0 - 1 %   Basophils Absolute 0.0 0.0 - 0.1 K/uL  Comprehensive metabolic panel     Status: Abnormal   Collection Time: 01/24/14 12:35 PM  Result Value Ref Range   Sodium 143 135 - 145 mmol/L    Comment: Please note change in reference range.   Potassium 4.1 3.5 - 5.1 mmol/L    Comment: Please note change in reference range.   Chloride 109 96 - 112 mEq/L   CO2 25 19 - 32 mmol/L   Glucose, Bld 109 (H) 70 - 99 mg/dL   BUN 25 (H) 6 - 23 mg/dL   Creatinine, Ser  1.67 (H) 0.50 - 1.35 mg/dL   Calcium 9.4 8.4 -  10.5 mg/dL   Total Protein 7.2 6.0 - 8.3 g/dL   Albumin 4.0 3.5 - 5.2 g/dL   AST 22 0 - 37 U/L   ALT 21 0 - 53 U/L   Alkaline Phosphatase 101 39 - 117 U/L   Total Bilirubin 1.0 0.3 - 1.2 mg/dL   GFR calc non Af Amer 37 (L) >90 mL/min   GFR calc Af Amer 43 (L) >90 mL/min    Comment: (NOTE) The eGFR has been calculated using the CKD EPI equation. This calculation has not been validated in all clinical situations. eGFR's persistently <90 mL/min signify possible Chronic Kidney Disease.    Anion gap 9 5 - 15  Lipase, blood     Status: None   Collection Time: 01/24/14 12:35 PM  Result Value Ref Range   Lipase 21 11 - 59 U/L  Troponin I  (only if pt is 79 y.o. or older and pain is above umbilicus)     Status: Abnormal   Collection Time: 01/24/14 12:35 PM  Result Value Ref Range   Troponin I 0.13 (H) <0.031 ng/mL    Comment:        PERSISTENTLY INCREASED TROPONIN VALUES IN THE RANGE OF 0.04-0.49 ng/mL CAN BE SEEN IN:       -UNSTABLE ANGINA       -CONGESTIVE HEART FAILURE       -MYOCARDITIS       -CHEST TRAUMA       -ARRYHTHMIAS       -LATE PRESENTING MYOCARDIAL INFARCTION       -COPD   CLINICAL FOLLOW-UP RECOMMENDED. Please note change in reference range.   Brain natriuretic peptide     Status: Abnormal   Collection Time: 01/24/14 12:35 PM  Result Value Ref Range   B Natriuretic Peptide 1108.0 (H) 0.0 - 100.0 pg/mL    Comment: Please note change in reference range.  I-Stat Chem 8, ED     Status: Abnormal   Collection Time: 01/24/14 12:43 PM  Result Value Ref Range   Sodium 142 135 - 145 mmol/L   Potassium 3.9 3.5 - 5.1 mmol/L   Chloride 108 96 - 112 mEq/L   BUN 23 6 - 23 mg/dL   Creatinine, Ser 1.60 (H) 0.50 - 1.35 mg/dL   Glucose, Bld 110 (H) 70 - 99 mg/dL   Calcium, Ion 1.19 1.13 - 1.30 mmol/L   TCO2 21 0 - 100 mmol/L   Hemoglobin 13.6 13.0 - 17.0 g/dL   HCT 40.0 39.0 - 52.0 %  I-Stat Troponin, ED (not at [facility])      Status: Abnormal   Collection Time: 01/24/14 12:51 PM  Result Value Ref Range   Troponin i, poc 0.09 (HH) 0.00 - 0.08 ng/mL   Comment NOTIFIED PHYSICIAN    Comment 3            Comment: Due to the release kinetics of cTnI, a negative result within the first hours of the onset of symptoms does not rule out myocardial infarction with certainty. If myocardial infarction is still suspected, repeat the test at appropriate intervals.   I-stat troponin, ED     Status: Abnormal   Collection Time: 01/24/14  3:03 PM  Result Value Ref Range   Troponin i, poc 0.10 (HH) 0.00 - 0.08 ng/mL   Comment NOTIFIED PHYSICIAN    Comment 3            Comment: Due to the release kinetics of cTnI, a negative result within the  first hours of the onset of symptoms does not rule out myocardial infarction with certainty. If myocardial infarction is still suspected, repeat the test at appropriate intervals.   Troponin I     Status: Abnormal   Collection Time: 01/24/14  3:25 PM  Result Value Ref Range   Troponin I 0.12 (H) <0.031 ng/mL    Comment:        PERSISTENTLY INCREASED TROPONIN VALUES IN THE RANGE OF 0.04-0.49 ng/mL CAN BE SEEN IN:       -UNSTABLE ANGINA       -CONGESTIVE HEART FAILURE       -MYOCARDITIS       -CHEST TRAUMA       -ARRYHTHMIAS       -LATE PRESENTING MYOCARDIAL INFARCTION       -COPD   CLINICAL FOLLOW-UP RECOMMENDED. Please note change in reference range.   Protime-INR     Status: Abnormal   Collection Time: 01/24/14  4:04 PM  Result Value Ref Range   Prothrombin Time 15.8 (H) 11.6 - 15.2 seconds   INR 1.25 0.00 - 1.49   Dg Chest Port 1 View  01/24/2014   CLINICAL DATA:  Productive cough and congestion. Difficulty breathing with shortness of breath. Vomiting.  EXAM: PORTABLE CHEST - 1 VIEW  COMPARISON:  03/10/2013.  FINDINGS: Trachea is midline. Heart is enlarged, stable. Thoracic aorta is calcified. Pacemaker and ICD lead tips are grossly stable in position. Lungs are  clear. No definite pleural fluid.  IMPRESSION: No acute findings.   Electronically Signed   By: Lorin Picket M.D.   On: 01/24/2014 13:27    Review of Systems  Constitutional: Negative for fever and diaphoresis.  HENT: Negative for congestion.   Respiratory: Positive for shortness of breath. Negative for cough and wheezing.   Cardiovascular: Positive for chest pain and orthopnea. Negative for leg swelling and PND.  Gastrointestinal: Positive for nausea, vomiting and abdominal pain (RLQ). Negative for blood in stool and melena.  Neurological: Negative for dizziness.  All other systems reviewed and are negative.   Blood pressure 131/75, pulse 72, temperature 98 F (36.7 C), temperature source Oral, resp. rate 24, height '5\' 3"'  (1.6 m), weight 176 lb (79.833 kg), SpO2 100 %. Physical Exam  Nursing note and vitals reviewed. Constitutional: He is oriented to person, place, and time. He appears well-developed and well-nourished. He appears distressed.  Severe Abd pain.  HENT:  Head: Normocephalic and atraumatic.  Eyes: EOM are normal. Pupils are equal, round, and reactive to light. No scleral icterus.  Neck: Normal range of motion.  Cardiovascular: Normal rate, regular rhythm, S1 normal and S2 normal.   No murmur heard. Pulses:      Radial pulses are 2+ on the right side, and 2+ on the left side.       Dorsalis pedis pulses are 2+ on the right side, and 2+ on the left side.  Respiratory: Effort normal and breath sounds normal. He has no wheezes. He has no rales.  GI: Bowel sounds are normal. There is tenderness.  Very tender in the RLQ,  Firm in that area.  Musculoskeletal: He exhibits no edema.  Neurological: He is alert and oriented to person, place, and time. He exhibits normal muscle tone.  Skin: Skin is warm and dry.  Psychiatric: He has a normal mood and affect.     Assessment/Plan 81 year old male patient Dr. Crissie Sickles who has a nonischemic cardiomyopathy ejection fraction  15% to 28%, chronic systolic CHF, V.  tach status post ICD implant. He was last seen by Dr. Lovena Le in 10/2012 at which time he was doing well. Cardiac catheterization in 2004 showed nonobstructive CAD with a proximal diffuse 40% stenosis in the RCA otherwise no focal lesions. Last defibrillator check was in March 2015.  During exam he was complaining of severe RLQ pain and not so much CP.     NSTEMI (non-ST elevated myocardial infarction) Troponin is mildly elevated.  On IV NTG and heparin.  Cycle troponin.  On coreg. ASA. Reassess in AM if further ischemic work up needed.    Nonischemic cardiomyopathy  EF 15-20%    Chronic systolic heart failure He appears euvolemic however, BNP is 1100.  CXR showed nothing acute. 74m IV lasix given at [facility]. Will reassess once pain is under better control.  Aldactone.        Automatic implantable cardioverter-defibrillator in situ   HTN (hypertension)  Controlled.  Continue to monitor     Chest pain  See above   ABD pain Severe pain, RLQ.  Very tender and tense.  Normal BM yesterday and BS.  History of inguinal hernia. ? Strangulation.  Appendicitis: WBCs WNL.  Fentanyl for pain. No morphine because of codeine allergy.  He does not know if he has ever had morphine.  Checking lipase.     CKD  Holding additional lasix, and cozaar incase he needs a left heart cath.  On IV NTG for after load         Quantel Mcinturff, PAC 01/24/2014, 6:44 PM

## 2014-01-24 NOTE — ED Notes (Addendum)
Pt reports pain radiating to upper chest and becoming increasingly restless/anxious. Dr. Wallie Char aware. No new orders given.

## 2014-01-24 NOTE — ED Notes (Addendum)
EKG completed x2. Both read acute MI. Dr. Wallie Char aware and shown EKG's. EDP is not calling [facility] at this time.

## 2014-01-24 NOTE — ED Notes (Signed)
Moderate dyspnea noted with exertion. o2 saturation wnl. Pt placed on 2 liters for comfort.

## 2014-01-24 NOTE — Progress Notes (Signed)
Pt complain 9/10 abdomen pain radiating to right chest.  Tarri Fuller notified and orders received to increase nitro gtt to 30 mcg.  Will continue to closely monitor.

## 2014-01-24 NOTE — ED Notes (Signed)
Pt obtained bed. [facility] reported would be 2 hours before truck could be arranged. Charge RN aware. EMS called and reported could have truck ready but RN would have to ride.

## 2014-01-24 NOTE — ED Notes (Addendum)
Pt reports lower abdominal pain is better if reposition self on left side. Pt no longer restless,mild anxiety and intense abdominal pain remain.

## 2014-01-24 NOTE — ED Notes (Signed)
CRITICAL VALUE ALERT  Critical value received:  I-STAT Troponin .10 (ACTION RANGE FLAG)  Date of notification:  4917  Time of notification:  1503  Critical value read back:Yes.    Nurse who received alert:  Parthenia Ames  MD notified (1st page):  Dr. Leonides Schanz  Time of first page:  1503  MD notified (2nd page):  Time of second page:  Responding MD:  Dr. Leonides Schanz  Time MD responded:  [phone]

## 2014-01-25 ENCOUNTER — Encounter (HOSPITAL_COMMUNITY): Payer: Self-pay | Admitting: Physician Assistant

## 2014-01-25 DIAGNOSIS — I7121 Aneurysm of the ascending aorta, without rupture: Secondary | ICD-10-CM | POA: Diagnosis present

## 2014-01-25 DIAGNOSIS — R1011 Right upper quadrant pain: Secondary | ICD-10-CM

## 2014-01-25 DIAGNOSIS — R911 Solitary pulmonary nodule: Secondary | ICD-10-CM | POA: Diagnosis present

## 2014-01-25 DIAGNOSIS — I712 Thoracic aortic aneurysm, without rupture: Secondary | ICD-10-CM | POA: Diagnosis present

## 2014-01-25 DIAGNOSIS — Z1211 Encounter for screening for malignant neoplasm of colon: Secondary | ICD-10-CM | POA: Insufficient documentation

## 2014-01-25 DIAGNOSIS — I248 Other forms of acute ischemic heart disease: Secondary | ICD-10-CM

## 2014-01-25 LAB — HEMOGLOBIN A1C
Hgb A1c MFr Bld: 6 % — ABNORMAL HIGH (ref <5.7)
Mean Plasma Glucose: 126 mg/dL — ABNORMAL HIGH (ref <117)

## 2014-01-25 LAB — BASIC METABOLIC PANEL
Anion gap: 11 (ref 5–15)
BUN: 21 mg/dL (ref 6–23)
CHLORIDE: 106 meq/L (ref 96–112)
CO2: 23 mmol/L (ref 19–32)
Calcium: 9.2 mg/dL (ref 8.4–10.5)
Creatinine, Ser: 1.6 mg/dL — ABNORMAL HIGH (ref 0.50–1.35)
GFR calc non Af Amer: 39 mL/min — ABNORMAL LOW (ref >90)
GFR, EST AFRICAN AMERICAN: 46 mL/min — AB (ref >90)
Glucose, Bld: 94 mg/dL (ref 70–99)
POTASSIUM: 3.7 mmol/L (ref 3.5–5.1)
Sodium: 140 mmol/L (ref 135–145)

## 2014-01-25 LAB — HEPARIN LEVEL (UNFRACTIONATED)
HEPARIN UNFRACTIONATED: 0.22 [IU]/mL — AB (ref 0.30–0.70)
Heparin Unfractionated: 0.26 IU/mL — ABNORMAL LOW (ref 0.30–0.70)

## 2014-01-25 LAB — LIPID PANEL
CHOLESTEROL: 169 mg/dL (ref 0–200)
HDL: 27 mg/dL — ABNORMAL LOW (ref >39)
LDL CALC: 120 mg/dL — AB (ref 0–99)
TRIGLYCERIDES: 112 mg/dL (ref <150)
Total CHOL/HDL Ratio: 6.3 RATIO
VLDL: 22 mg/dL (ref 0–40)

## 2014-01-25 LAB — CBC
HEMATOCRIT: 34.2 % — AB (ref 39.0–52.0)
Hemoglobin: 11 g/dL — ABNORMAL LOW (ref 13.0–17.0)
MCH: 29.6 pg (ref 26.0–34.0)
MCHC: 32.2 g/dL (ref 30.0–36.0)
MCV: 92.2 fL (ref 78.0–100.0)
Platelets: 242 10*3/uL (ref 150–400)
RBC: 3.71 MIL/uL — ABNORMAL LOW (ref 4.22–5.81)
RDW: 14.1 % (ref 11.5–15.5)
WBC: 7.5 10*3/uL (ref 4.0–10.5)

## 2014-01-25 LAB — TROPONIN I
TROPONIN I: 0.09 ng/mL — AB (ref <0.031)
Troponin I: 0.09 ng/mL — ABNORMAL HIGH (ref <0.031)

## 2014-01-25 LAB — LIPASE, BLOOD: Lipase: 22 U/L (ref 11–59)

## 2014-01-25 MED ORDER — LOSARTAN POTASSIUM 25 MG PO TABS
25.0000 mg | ORAL_TABLET | Freq: Every day | ORAL | Status: DC
  Administered 2014-01-25: 25 mg via ORAL
  Filled 2014-01-25: qty 1

## 2014-01-25 NOTE — Progress Notes (Signed)
Subjective:  Feels much better. At home feels cold and hot. No BM yet. No melena at home. Wants to eat.   Objective:  Vital Signs in the last 24 hours: Temp:  [97.4 F (36.3 C)-98.5 F (36.9 C)] 98 F (36.7 C) (01/12 0745) Pulse Rate:  [61-108] 76 (01/12 0745) Resp:  [15-35] 25 (01/12 0745) BP: (107-139)/(22-93) 117/63 mmHg (01/12 0745) SpO2:  [94 %-100 %] 95 % (01/12 0745) Weight:  [169 lb 12.1 oz (77 kg)-176 lb (79.833 kg)] 169 lb 12.1 oz (77 kg) (01/12 0400)  Intake/Output from previous day: 01/11 0701 - 01/12 0700 In: 332.1 [I.V.:332.1] Out: 1205 [Urine:1205]   Physical Exam: General: Well developed, well nourished, elderly in no acute distress. Head:  Normocephalic and atraumatic. Lungs: Clear to auscultation and percussion. Heart: Normal S1 and S2.  ICD, No murmur, rubs or gallops.  Abdomen: soft, non-tender, positive bowel sounds. Mild protuberant Extremities: No clubbing or cyanosis. No edema. Neurologic: Alert and oriented x 3.    Lab Results:  Recent Labs  01/24/14 1235 01/24/14 1243 01/25/14 0805  WBC 6.4  --  7.5  HGB 11.7* 13.6 11.0*  PLT 255  --  242    Recent Labs  01/24/14 1235 01/24/14 1243 01/25/14 0225  NA 143 142 140  K 4.1 3.9 3.7  CL 109 108 106  CO2 25  --  23  GLUCOSE 109* 110* 94  BUN 25* 23 21  CREATININE 1.67* 1.60* 1.60*    Recent Labs  01/25/14 0225 01/25/14 0805  TROPONINI 0.09* 0.09*   Hepatic Function Panel  Recent Labs  01/24/14 1235  PROT 7.2  ALBUMIN 4.0  AST 22  ALT 21  ALKPHOS 101  BILITOT 1.0    Recent Labs  01/25/14 0225  CHOL 169     Imaging: Ct Abdomen Pelvis Wo Contrast  01/24/2014   CLINICAL DATA:  Short winded for 2 weeks, unable to catch breath, vomiting for 2 days, abdominal pain, hernia, history hypertension, coronary artery disease, COPD, dementia  EXAM: CT ABDOMEN AND PELVIS WITHOUT CONTRAST  TECHNIQUE: Multidetector CT imaging of the abdomen and pelvis was performed following  the standard protocol without IV contrast. CT imaging of the chest was inadvertently performed and is reported below. Sagittal and coronal MPR images reconstructed from axial data set. Oral contrast was not administered.  COMPARISON:  CT abdomen and pelvis 03/10/2013, CT chest 06/11/2005  FINDINGS: Atherosclerotic calcifications aorta and coronary arteries.  Aneurysmal dilatation ascending thoracic aorta 4.8 x 4.8 cm image 35.  Scattered normal size thoracic lymph nodes.  Enlargement of cardiac chambers with pacemaker leads at RIGHT atrium and RIGHT ventricle.  Lungs are emphysematous with bibasilar atelectasis greater on RIGHT.  7 x 6 mm RIGHT upper lobe pulmonary nodule image 21 unchanged.  No infiltrate, pleural effusion or pneumothorax.  LEFT renal cysts again identified largest 3.9 x 3.4 cm image 73.  Within limits of a nonenhanced exam, liver, spleen, pancreas, kidneys, and adrenal glands otherwise unremarkable.  Stomach inadequately distended, unable to exclude gastric wall thickening.  Scattered colonic diverticulosis without changes of diverticulitis.  Normal appendix.  Bowel loops otherwise normal appearance.  BILATERAL inguinal hernias containing fat, larger on LEFT.  Unremarkable bladder and ureters.  No mass, adenopathy, free fluid, or free air.  Bones demineralized with scattered degenerative changes of the thoracolumbar spine.  Significant atherosclerotic calcifications without aortic aneurysm.  Borderline enlarged retrocrural lymph node 10 mm short axis image 58, previously 9 mm.  IMPRESSION: Extensive  atherosclerotic disease changes with aneurysmal dilatation ascending thoracic aorta 4.8 x 4.8 cm. Recommend followup by abdomen and pelvis CTA in 6 months, and vascular surgery referral/consultation if not already obtained. This recommendation follows ACR consensus guidelines: White Paper of the ACR Incidental Findings Committee II on Vascular Findings. J Am Coll Radiol 2013; 10:789-794.  Colonic  diverticulosis without evidence of diverticulitis.  BILATERAL inguinal hernias containing fat larger on LEFT.  LEFT renal cysts.  Emphysematous changes with stable 7 x 6 mm RIGHT upper lobe nodule.   Electronically Signed   By: Lavonia Dana M.D.   On: 01/24/2014 21:53   Dg Chest Port 1 View  01/24/2014   CLINICAL DATA:  Productive cough and congestion. Difficulty breathing with shortness of breath. Vomiting.  EXAM: PORTABLE CHEST - 1 VIEW  COMPARISON:  03/10/2013.  FINDINGS: Trachea is midline. Heart is enlarged, stable. Thoracic aorta is calcified. Pacemaker and ICD lead tips are grossly stable in position. Lungs are clear. No definite pleural fluid.  IMPRESSION: No acute findings.   Electronically Signed   By: Lorin Picket M.D.   On: 01/24/2014 13:27   Personally viewed.   Telemetry: NSVT 15 beats  Personally viewed.   Cardiac Studies:  ECHO 09/06/09: EF 15-20% Cath 2004: NICM  Scheduled Meds: . allopurinol  100 mg Oral Daily  . aspirin EC  81 mg Oral Daily  . carvedilol  25 mg Oral BID WC  . pantoprazole  40 mg Oral Daily  . spironolactone  25 mg Oral Daily   Continuous Infusions: . nitroGLYCERIN 40 mcg/min (01/24/14 1954)   PRN Meds:.acetaminophen, fentaNYL, nitroGLYCERIN, ondansetron (ZOFRAN) IV, traMADol  Assessment/Plan:   81 year old admitted with abdominal pain, with chronic systolic HF, non ischemic cardiomyopathy, dilated ascending aorta, ICD, NSVT, mildly elevated troponin.  Mildly elevated troponin  - flat, 0.9, likely demand ischemia in the setting of underlying cardiomyopathy  - stopping IV heparin  - Stopping IV NTG  - No further pain  Abdominal pain  - resolved. Abd CT with no adverse process. Hernias noted. Small Borderline enlarged retrocrural lymph node 10 mm short axis image 58, previously 9 mm.  - no emergency process such as strangulation. No further nausea. Lipase normal, LFT's normal. TSH normal ? cause  - Will give diet now. Monitor  Chronic systolic  heart failure  - appears euvolemic  - got one dose of IV lasix in ER  - continue with current home meds  - I will add back losartan 25mg .   Chest pain  - he states that last NTG SL was 3 weeks ago. Very sparingly takes this med. At prior visit with Estella Husk on 12/18/13 discussion about NUC stress noted.  He would like to hold off at this time and I agree. If symptoms worsen could increase antianginal medication.   - NICM  ICD  - stable  NSVT  - expected with EF 15-20%  - ICD  - coreg 25 BID  Ascending aortic aneusysm   - 75mm x 64mm on CT   - not yet at point for surgical intervention  - continue to monitor as outpatient  - could check ECHO in 6 months  - discussed with he and family that repair would be high risk given his underlying EF.   If able to tolerate diet and ambulate OK, would be OK with DC home later today with close follow up in 1 week with Dr. Lovena Le or flex APP.   SKAINS, Rivanna 01/25/2014, 10:44 AM

## 2014-01-25 NOTE — Discharge Summary (Signed)
Discharge Summary   Patient ID: [name] Macias,  MRN: [id number], DOB/AGE: 1932/10/23 81 y.o.  Admit date: 01/24/2014 Discharge date: 01/25/2014  Primary Care Provider: Tula Nakayama Primary Cardiologist: Dr. Lovena Le  Discharge Diagnoses Principal Problem:   NSTEMI (non-ST elevated myocardial infarction) Active Problems:   Nonischemic cardiomyopathy   Chronic systolic heart failure   Automatic implantable cardioverter-defibrillator in situ   HTN (hypertension)   Chest pain   Abdominal pain   Dyspnea   Ascending aortic aneurysm   Lung nodule   Allergies Allergies  Allergen Reactions  . Codeine Other (See Comments)    Whelps up  . Enalapril Maleate Other (See Comments)    Whelps up  . Sulfonamide Derivatives Other (See Comments)    Whelps up    Procedures  Abdominal CT 01/24/2014 IMPRESSION: Extensive atherosclerotic disease changes with aneurysmal dilatation ascending thoracic aorta 4.8 x 4.8 cm. Recommend followup by abdomen and pelvis CTA in 6 months, and vascular surgery referral/consultation if not already obtained. This recommendation follows ACR consensus guidelines: White Paper of the ACR Incidental Findings Committee II on Vascular Findings. J Am Coll Radiol 2013; 10:789-794.  Colonic diverticulosis without evidence of diverticulitis.  BILATERAL inguinal hernias containing fat larger on LEFT.  LEFT renal cysts.  Emphysematous changes with stable 7 x 6 mm RIGHT upper lobe nodule.    Hospital Course  The patient is a 81 year old male with history of nonischemic cardiomyopathy with baseline EF 22-97%, chronic systolic heart failure, V. tach status post ICD implant. He was last seen by his primary cardiologist, Dr. Lovena Le in October 2014 at which time he was doing well. Cardiac catheterization in 2004 showed nonobstructive CAD with proximal diffuse 40% stenosis in RCA, otherwise no focal lesions. His last defibrillator check was in March 2015. Patient  presented to [facility] with abdominal pain radiating to his chest. He also had associated nausea, vomiting, and shortness of breath for 2 weeks with exertion and lying flat. During exam, he was complaining of severe right lower quadrant pain but not so much chest pain. Initial troponin was mildly elevated at 0.13. BNP 1100. Cr 1.67. Hemoglobin 11.7. He was placed on IV nitroglycerin and heparin and transferred to [facility] for further evaluation. Chest x-ray showed no acute findings. CT of the abdomen showed extensive atherosclerotic disease with aneurysmal dilatation of the ascending aorta 4.8 x 4.8 cm, recommend follow-up by CTA in 6 month, 7 x 6 mm right upper lobe nodule with emphysematous changes, bilateral inguinal hernia containing fat larger on the left, colonic diverticulosis without evidence of diverticulitis. Patient was informed of CT finding and recommended outpatient followup.  He was seen the morning of 01/25/2014, serial troponin noted to be flat at 0.9, it was felt the elevated troponin was likely demand ischemia in the setting of underlying cardiomyopathy. IV nitroglycerin and heparin has been stopped. It is unclear what's causing his abdominal pain, abdominal CT showed no acute process other than hernia. It does show a small borderline enlarged crural lymph node 10 mm. Although patient have history of systolic cardiomyopathy and his BNP was elevated on arrival, however physical exam shows he appears to be euvolemic this morning after receiving a single dose of IV Lasix in the ED. His ascending aortic aneurysm is not yet at the point for surgical intervention, outpatient echocardiogram in 6 months was recommended along with continuous outpatient monitoring. He ate lunch without significant abdominal discomfort. He also had a bowel movement. Nursing staff ambulated the patient and he  denies any exertional CP or SOB. He is deemed stable for discharge. Dr. Marlou Porch recommended close  outpatient follow-up in one week and circumflex clinic. I have sent a staff email to our scheduler to schedule outpatient follow-up in 1 week at [facility].   Discharge Vitals Blood pressure 117/63, pulse 76, temperature 98 F (36.7 C), temperature source Oral, resp. rate 25, height 5\' 8"  (1.727 m), weight 169 lb 12.1 oz (77 kg), SpO2 95 %.  Filed Weights   01/24/14 1229 01/24/14 2011 01/25/14 0400  Weight: 176 lb (79.833 kg) 169 lb 12.1 oz (77 kg) 169 lb 12.1 oz (77 kg)    Labs  CBC  Recent Labs  01/24/14 1235 01/24/14 1243 01/25/14 0805  WBC 6.4  --  7.5  NEUTROABS 4.4  --   --   HGB 11.7* 13.6 11.0*  HCT 35.6* 40.0 34.2*  MCV 92.2  --  92.2  PLT 255  --  081   Basic Metabolic Panel  Recent Labs  01/24/14 1235 01/24/14 1243 01/25/14 0225  NA 143 142 140  K 4.1 3.9 3.7  CL 109 108 106  CO2 25  --  23  GLUCOSE 109* 110* 94  BUN 25* 23 21  CREATININE 1.67* 1.60* 1.60*  CALCIUM 9.4  --  9.2   Liver Function Tests  Recent Labs  01/24/14 1235  AST 22  ALT 21  ALKPHOS 101  BILITOT 1.0  PROT 7.2  ALBUMIN 4.0    Recent Labs  01/24/14 1235 01/25/14 0225  LIPASE 21 22   Cardiac Enzymes  Recent Labs  01/24/14 2100 01/25/14 0225 01/25/14 0805  TROPONINI 0.09* 0.09* 0.09*   BNP Invalid input(s): POCBNP D-Dimer No results for input(s): DDIMER in the last 72 hours. Hemoglobin A1C  Recent Labs  01/24/14 2020  HGBA1C 6.0*   Fasting Lipid Panel  Recent Labs  01/25/14 0225  CHOL 169  HDL 27*  LDLCALC 120*  TRIG 112  CHOLHDL 6.3   Thyroid Function Tests  Recent Labs  01/24/14 2020  TSH 1.933    Disposition  Pt is being discharged home today in good condition.  Follow-up Plans & Appointments      Follow-up Information    Follow up with Cristopher Peru, MD On 03/18/2014.   Specialty:  Cardiology   Why:  9:30am   Contact information:   [place] [address] [phone]       Follow up with [facility].   Specialty:  Cardiology   Why:  Office will contact you to schedule followup, please call us if do no hear from [place] in 2 business days   Contact information:   [address] [place] [phone]      Discharge Medications    Medication List    STOP taking these medications        penicillin v potassium 500 MG tablet  Commonly known as:  VEETID      TAKE these medications        acetaminophen 500 MG tablet  Commonly known as:  TYLENOL  Take 1,000 mg by mouth daily as needed for moderate pain.     allopurinol 100 MG tablet  Commonly known as:  ZYLOPRIM  Take 1 tablet (100 mg total) by mouth daily.     ASPIRIN LOW DOSE 81 MG EC tablet  Generic drug:  aspirin  Take 81 mg by mouth daily.  carvedilol 25 MG tablet  Commonly known as:  COREG  Take 1 tablet (25 mg total) by mouth 2 (two) times daily with a meal.     chlorpheniramine-HYDROcodone 10-8 MG/5ML Lqcr  Commonly known as:  TUSSIONEX  Take 5 mLs by mouth daily as needed for cough.     furosemide 40 MG tablet  Commonly known as:  LASIX  Take 1 tablet (40 mg total) by mouth 2 (two) times daily.     losartan 25 MG tablet  Commonly known as:  COZAAR  Take 1 tablet (25 mg total) by mouth daily.     mirtazapine 15 MG tablet  Commonly known as:  REMERON  Take 1 tablet (15 mg total) by mouth at bedtime.     nitroGLYCERIN 0.4 MG SL tablet  Commonly known as:  NITROSTAT  Place 1 tablet (0.4 mg total) under the tongue every 5 (five) minutes as needed. For chest pain     NON FORMULARY  2 mLs. Oxygen. Daily at night and PRN during the day.     omeprazole 20 MG capsule  Commonly known as:  PRILOSEC  Take 1 capsule (20 mg total) by mouth daily.     spironolactone 25 MG tablet  Commonly known as:  ALDACTONE  Take 1 tablet (25 mg total) by mouth daily.     temazepam 30 MG capsule  Commonly known as:  RESTORIL  Take 1 capsule (30 mg total) by mouth at bedtime  as needed for sleep.     traMADol 50 MG tablet  Commonly known as:  ULTRAM  Take 1 tablet (50 mg total) by mouth every 6 (six) hours as needed.     VITAMIN C PO  Take 1 tablet by mouth daily.     VITAMIN D PO  Take 2 tablets by mouth daily.        Duration of Discharge Encounter   Greater than 30 minutes including physician time.  Hilbert Corrigan PA-C Pager: [phone] 01/25/2014, 3:11 PM   Personally seen and examined. Agree with above. Observed, CT of abd no acute process. Abd pain resolved. Eating well.  Candee Furbish, MD

## 2014-01-25 NOTE — Assessment & Plan Note (Signed)
Controlled, no change in medication  

## 2014-01-25 NOTE — Progress Notes (Addendum)
ANTICOAGULATION CONSULT NOTE - Follow Up Consult  Pharmacy Consult for Heparin Indication: chest pain/ACS  Allergies  Allergen Reactions  . Codeine Other (See Comments)    Whelps up  . Enalapril Maleate Other (See Comments)    Whelps up  . Sulfonamide Derivatives Other (See Comments)    Whelps up    Patient Measurements: Height: 5\' 8"  (172.7 cm) Weight: 169 lb 12.1 oz (77 kg) IBW/kg (Calculated) : 68.4 Heparin Dosing Weight:   Vital Signs: Temp: 98 F (36.7 C) (01/12 0745) Temp Source: Oral (01/12 0745) BP: 117/63 mmHg (01/12 0745) Pulse Rate: 76 (01/12 0745)  Labs:  Recent Labs  01/24/14 1235 01/24/14 1243  01/24/14 1604 01/24/14 2100 01/25/14 0035 01/25/14 0225 01/25/14 0805 01/25/14 0953  HGB 11.7* 13.6  --   --   --   --   --  11.0*  --   HCT 35.6* 40.0  --   --   --   --   --  34.2*  --   PLT 255  --   --   --   --   --   --  242  --   LABPROT  --   --   --  15.8*  --   --   --   --   --   INR  --   --   --  1.25  --   --   --   --   --   HEPARINUNFRC  --   --   --   --   --  0.22*  --   --  0.26*  CREATININE 1.67* 1.60*  --   --   --   --  1.60*  --   --   TROPONINI 0.13*  --   < >  --  0.09*  --  0.09* 0.09*  --   < > = values in this interval not displayed.  Estimated Creatinine Clearance: 36.2 mL/min (by C-G formula based on Cr of 1.6).   Medications:  Scheduled:  . allopurinol  100 mg Oral Daily  . aspirin EC  81 mg Oral Daily  . carvedilol  25 mg Oral BID WC  . losartan  25 mg Oral Daily  . pantoprazole  40 mg Oral Daily  . spironolactone  25 mg Oral Daily    Assessment: 81yo male with chest pain, ACS.  Heparin level this AM remains just below goal range, following overnight rate adjustment.  Hg 11- decreased and pltc 242.  No bleeding noted, no issues with IV pump.  Noted- heparin d/c'd by Dr. Marlou Porch.  Goal of Therapy:  Heparin level 0.3-0.7 units/ml Monitor platelets by anticoagulation protocol: Yes   Plan:  -D/C daily HL and  CBC  Gracy Bruins, PharmD Clinical Pharmacist [facility]

## 2014-01-25 NOTE — Assessment & Plan Note (Signed)
Controlled, no change in medication DASH diet and commitment to daily physical activity for a minimum of 30 minutes discussed and encouraged, as a part of hypertension management. The importance of attaining a healthy weight is also discussed.  

## 2014-01-25 NOTE — Assessment & Plan Note (Addendum)
Start remron for help with sleep, depression, anxiety and appetite , f/u in 2 months, pt not suicidal or homicidal Mental staus has deteriorated sincedx of prostate cancer per spouse, no treatment at this time , just observation per urology

## 2014-01-25 NOTE — Progress Notes (Addendum)
[place] for Heparin Indication: chest pain/ACS  Allergies  Allergen Reactions  . Codeine Other (See Comments)    Whelps up  . Enalapril Maleate Other (See Comments)    Whelps up  . Sulfonamide Derivatives Other (See Comments)    Whelps up    Patient Measurements: Height: 5\' 8"  (172.7 cm) Weight: 176 lb (79.833 kg) IBW/kg (Calculated) : 68.4 Heparin Dosing Weight: 73.7 kg  Vital Signs: Temp: 98.5 F (36.9 C) (01/12 0003) Temp Source: Oral (01/12 0003) BP: 117/67 mmHg (01/12 0003) Pulse Rate: 74 (01/12 0003)  Labs:  Recent Labs  01/24/14 1235 01/24/14 1243 01/24/14 1525 01/24/14 1604 01/24/14 2100 01/25/14 0035  HGB 11.7* 13.6  --   --   --   --   HCT 35.6* 40.0  --   --   --   --   PLT 255  --   --   --   --   --   LABPROT  --   --   --  15.8*  --   --   INR  --   --   --  1.25  --   --   HEPARINUNFRC  --   --   --   --   --  0.22*  CREATININE 1.67* 1.60*  --   --   --   --   TROPONINI 0.13*  --  0.12*  --  0.09*  --     Estimated Creatinine Clearance: 36.2 mL/min (by C-G formula based on Cr of 1.6).  Assessment: 81 y.o. male with chest pain for heparin   Goal of Therapy:  Heparin level 0.3-0.7 units/ml Monitor platelets by anticoagulation protocol: Yes   Plan:  Increase Heparin 1050 units/hr Follow-up am labs.  Keigan Girten, Bronson Curb 01/25/2014,1:35 AM

## 2014-01-25 NOTE — Discharge Instructions (Signed)

## 2014-01-25 NOTE — Assessment & Plan Note (Signed)
Importance of use of supplemenytal oxygen during sleep is stressed

## 2014-01-25 NOTE — Assessment & Plan Note (Signed)
C/o increased exertional fatigiue, needs cardiology re eval, referral entered

## 2014-01-25 NOTE — Assessment & Plan Note (Signed)
Penicillin prescribed and pt to obtain CXR which had previously been ordered

## 2014-01-25 NOTE — Assessment & Plan Note (Signed)
Rectal exam: no mass and heme negative stool 

## 2014-02-03 ENCOUNTER — Ambulatory Visit: Payer: Commercial Managed Care - HMO | Admitting: Family Medicine

## 2014-02-16 ENCOUNTER — Ambulatory Visit: Payer: Commercial Managed Care - HMO | Admitting: Family Medicine

## 2014-03-18 ENCOUNTER — Other Ambulatory Visit: Payer: Self-pay | Admitting: *Deleted

## 2014-03-18 ENCOUNTER — Encounter: Payer: Self-pay | Admitting: Internal Medicine

## 2014-03-18 ENCOUNTER — Ambulatory Visit (INDEPENDENT_AMBULATORY_CARE_PROVIDER_SITE_OTHER): Payer: Commercial Managed Care - HMO | Admitting: Internal Medicine

## 2014-03-18 VITALS — BP 100/60 | HR 51 | Ht 66.0 in | Wt 167.6 lb

## 2014-03-18 DIAGNOSIS — I5022 Chronic systolic (congestive) heart failure: Secondary | ICD-10-CM

## 2014-03-18 DIAGNOSIS — I429 Cardiomyopathy, unspecified: Secondary | ICD-10-CM

## 2014-03-18 DIAGNOSIS — I428 Other cardiomyopathies: Secondary | ICD-10-CM

## 2014-03-18 DIAGNOSIS — I10 Essential (primary) hypertension: Secondary | ICD-10-CM

## 2014-03-18 DIAGNOSIS — Z95810 Presence of automatic (implantable) cardiac defibrillator: Secondary | ICD-10-CM

## 2014-03-18 LAB — PACEMAKER DEVICE OBSERVATION

## 2014-03-18 LAB — MDC_IDC_ENUM_SESS_TYPE_INCLINIC
Battery Voltage: 3.03 V
Brady Statistic AP VP Percent: 0.49 %
Brady Statistic AS VS Percent: 99.43 %
Brady Statistic RA Percent Paced: 0.51 %
Brady Statistic RV Percent Paced: 0.55 %
Date Time Interrogation Session: 20160304092807
HIGH POWER IMPEDANCE MEASURED VALUE: 254 Ohm
HighPow Impedance: 171 Ohm
HighPow Impedance: 285 Ohm
HighPow Impedance: 54 Ohm
Lead Channel Impedance Value: 4047 Ohm
Lead Channel Impedance Value: 4047 Ohm
Lead Channel Impedance Value: 456 Ohm
Lead Channel Pacing Threshold Amplitude: 0.75 V
Lead Channel Pacing Threshold Pulse Width: 0.4 ms
Lead Channel Sensing Intrinsic Amplitude: 3.625 mV
Lead Channel Sensing Intrinsic Amplitude: 4.75 mV
Lead Channel Sensing Intrinsic Amplitude: 7.375 mV
Lead Channel Sensing Intrinsic Amplitude: 9 mV
Lead Channel Setting Pacing Amplitude: 2 V
Lead Channel Setting Pacing Amplitude: 2 V
Lead Channel Setting Pacing Pulse Width: 0.4 ms
Lead Channel Setting Sensing Sensitivity: 0.3 mV
MDC IDC MSMT LEADCHNL LV IMPEDANCE VALUE: 4047 Ohm
MDC IDC MSMT LEADCHNL RA PACING THRESHOLD PULSEWIDTH: 0.4 ms
MDC IDC MSMT LEADCHNL RV IMPEDANCE VALUE: 361 Ohm
MDC IDC MSMT LEADCHNL RV PACING THRESHOLD AMPLITUDE: 0.75 V
MDC IDC SET ZONE DETECTION INTERVAL: 350 ms
MDC IDC STAT BRADY AP VS PERCENT: 0.02 %
MDC IDC STAT BRADY AS VP PERCENT: 0.06 %
Zone Setting Detection Interval: 300 ms
Zone Setting Detection Interval: 350 ms
Zone Setting Detection Interval: 450 ms

## 2014-03-18 MED ORDER — NITROGLYCERIN 0.4 MG SL SUBL: 0.4000 mg | SUBLINGUAL_TABLET | SUBLINGUAL | Status: DC | PRN

## 2014-03-18 MED ORDER — NITROGLYCERIN 0.4 MG SL SUBL: 0.4000 mg | SUBLINGUAL_TABLET | SUBLINGUAL | Status: AC | PRN

## 2014-03-18 NOTE — Patient Instructions (Addendum)
Your physician recommends that you schedule a follow-up appointment in: 3 months with the Device clinic   Your physician recommends that you schedule a follow-up appointment in: 1 year with Dr. Lovena Le    Your physician recommends that you continue on your current medications as directed. Please refer to the Current Medication list given to you today.  Thank you for choosing [facility]!

## 2014-03-18 NOTE — Assessment & Plan Note (Signed)
His symptoms remain class 2. He will continue his current meds. I have encouraged the patient to maintain a low sodium diet.

## 2014-03-18 NOTE — Assessment & Plan Note (Signed)
His medtronic DDD ICD is working normally. Will recheck in several months. 

## 2014-03-18 NOTE — Assessment & Plan Note (Signed)
His blood pressure is very well controlled. No change in medications. He will maintain a low sodium diet.

## 2014-03-18 NOTE — Progress Notes (Signed)
HPI Mr. Futch returns today for followup. He is a pleasant 81 yo man with chronic systolic CHF, VT, s/p ICD implant. He has had no problems since I saw him last. He denies chest pain, sob, or syncope. No ICD shock. He remains active, working around the house. He does note an episode where he was in the hospital with abdominal pain which has resolved. No diarrhea or bleeding.  Allergies  Allergen Reactions  . Codeine Other (See Comments)    Whelps up  . Enalapril Maleate Other (See Comments)    Whelps up  . Sulfonamide Derivatives Other (See Comments)    Whelps up     Current Outpatient Prescriptions  Medication Sig Dispense Refill  . acetaminophen (TYLENOL) 500 MG tablet Take 1,000 mg by mouth daily as needed for moderate pain.     Marland Kitchen allopurinol (ZYLOPRIM) 100 MG tablet Take 1 tablet (100 mg total) by mouth daily. 90 tablet 1  . Ascorbic Acid (VITAMIN C PO) Take 1 tablet by mouth daily.    Marland Kitchen aspirin (ASPIRIN LOW DOSE) 81 MG EC tablet Take 81 mg by mouth daily.      . carvedilol (COREG) 25 MG tablet Take 1 tablet (25 mg total) by mouth 2 (two) times daily with a meal. 180 tablet 1  . chlorpheniramine-HYDROcodone (TUSSIONEX) 10-8 MG/5ML LQCR Take 5 mLs by mouth daily as needed for cough.     . Cholecalciferol (VITAMIN D PO) Take 2 tablets by mouth daily.    . furosemide (LASIX) 40 MG tablet Take 1 tablet (40 mg total) by mouth 2 (two) times daily. 180 tablet 1  . losartan (COZAAR) 25 MG tablet Take 1 tablet (25 mg total) by mouth daily. 90 tablet 1  . mirtazapine (REMERON) 15 MG tablet Take 1 tablet (15 mg total) by mouth at bedtime. 30 tablet 3  . nitroGLYCERIN (NITROSTAT) 0.4 MG SL tablet Place 1 tablet (0.4 mg total) under the tongue every 5 (five) minutes as needed. For chest pain 30 tablet 0  . NON FORMULARY 2 mLs. Oxygen. Daily at night and PRN during the day.     Marland Kitchen omeprazole (PRILOSEC) 20 MG capsule Take 1 capsule (20 mg total) by mouth daily. 30 capsule 3  . spironolactone  (ALDACTONE) 25 MG tablet Take 1 tablet (25 mg total) by mouth daily. 90 tablet 1  . temazepam (RESTORIL) 30 MG capsule Take 1 capsule (30 mg total) by mouth at bedtime as needed for sleep. 90 capsule 1  . traMADol (ULTRAM) 50 MG tablet Take 1 tablet (50 mg total) by mouth every 6 (six) hours as needed. (Patient taking differently: Take 50 mg by mouth every 6 (six) hours as needed for moderate pain. ) 90 tablet 1   No current facility-administered medications for this visit.     Past Medical History  Diagnosis Date  . Arthritis   . Gout   . Non-ischemic cardiomyopathy   . Hypertension   . Arthritis     left foot   . CAD (coronary artery disease) of artery bypass graft   . COPD (chronic obstructive pulmonary disease)   . History of syphilis   . Type 2 HSV infection of penis   . Erectile dysfunction   . Anxiety disorder   . Dementia   . Anxiety   . Angina   . GERD (gastroesophageal reflux disease)   . Ascending aortic aneurysm   . Lung nodule     RUL nodule noted on abdominal CT  ROS:   All systems reviewed and negative except as noted in the HPI.   Past Surgical History  Procedure Laterality Date  . Left ankle      Not Done by Dr. Aline Brochure   . S/p  biventricular pacemaker / aicd implantation  02/19/05  . Defribrillator implant    . Crtd upgrade  12/14/2009  . Transthoracic echocardiogram  2011  . Doppler echocardiography  2009  . Insert / replace / remove pacemaker    . Esophagogastroduodenoscopy N/A 07/01/2013    Procedure: ESOPHAGOGASTRODUODENOSCOPY (EGD);  Surgeon: Rogene Houston, MD;  [facility];  Service: Endoscopy;  Laterality: N/A;  145-moved to 1245 Ann notfied pt     Family History  Problem Relation Age of Onset  . Heart failure Father   . Prostate cancer Brother   . Diabetes Brother   . Hypertension Brother   . Heart failure Brother      History   Social History  . Marital Status: Married    Spouse Name: N/A  . Number of Children:  3  . Years of Education: N/A   Occupational History  . disabled     Social History Main Topics  . Smoking status: Former Smoker -- 1.00 packs/day for 10 years    Types: Cigarettes    Quit date: 01/15/1963  . Smokeless tobacco: Never Used  . Alcohol Use: No  . Drug Use: No  . Sexual Activity: Not on file   Other Topics Concern  . Not on file   Social History Narrative     BP 100/60 mmHg  Pulse 51  Ht 5\' 6"  (1.676 m)  Wt 167 lb 9.6 oz (76.023 kg)  BMI 27.06 kg/m2  Physical Exam:  Well appearing 81 yo man, NAD HEENT: Unremarkable Neck:  7 cm JVD, no thyromegally Lungs:  Clear with no wheezes, rales, or rhonchi HEART:  Regular rate rhythm, no murmurs, no rubs, no clicks Abd:  soft, positive bowel sounds, no organomegally, no rebound, no guarding Ext:  2 plus pulses, no edema, no cyanosis, no clubbing Skin:  No rashes no nodules Neuro:  CN II through XII intact, motor grossly intact  DEVICE  Normal device function.  See PaceArt for details.   Assess/Plan:

## 2014-03-21 ENCOUNTER — Encounter: Payer: Self-pay | Admitting: Internal Medicine

## 2014-03-22 ENCOUNTER — Other Ambulatory Visit: Payer: Self-pay | Admitting: Family Medicine

## 2014-04-04 ENCOUNTER — Encounter: Payer: Self-pay | Admitting: *Deleted

## 2014-04-04 ENCOUNTER — Ambulatory Visit: Payer: Self-pay | Admitting: *Deleted

## 2014-04-04 ENCOUNTER — Other Ambulatory Visit: Payer: Self-pay | Admitting: *Deleted

## 2014-04-04 NOTE — Patient Outreach (Signed)
04/04/2014  [name] Macias 1932-06-22 [id number]  [name] Macias is an 81 y.o. male  Subjective: "I'm doing okay, I guess. I sure do miss my daughter."  Objective:  Review of Systems  Constitutional: Negative.   HENT: Negative.   Eyes: Negative.   Respiratory: Positive for shortness of breath.        Reports noticeable difference in dyspnea on exertion over the last 6 months.   Cardiovascular: Negative.        Reports having had chest pain in the past and is concerned about not being able to get Ntg refill. Denies current or recent chest pain.   Gastrointestinal: Positive for abdominal pain.       Reports worsened/increased general abdominal discomfort since decreasing Omeprazole dose from 40mg  po qd to 20mg  po qd.  Genitourinary: Negative.   Musculoskeletal: Positive for joint pain.       Reports bilateral shoulder discomfort/aching L>R, worse at night when lying down to sleep.   Skin: Negative.   Neurological: Negative.   Endo/Heme/Allergies: Negative.   Psychiatric/Behavioral: Positive for depression. Negative for suicidal ideas. The patient has insomnia.        Please see PHQ2/PHQ9 results. [name] Macias 34 year old daughter died unexpectedly 1 month ago during scheduled surgical procedure. Patient and wife very emotional today when talking about this event. Patient and spouse asked for "help with dealing with all of it."   Please see plan: referred to LCSW for counseling/mental health support/grief support; notified primary care provider of patient request for consideration of antidepressant.    Physical Exam  Constitutional: He is oriented to person, place, and time. He appears well-developed and well-nourished.  Cardiovascular: Regular rhythm.  Bradycardia present.  Exam reveals no friction rub.   No murmur heard. Respiratory: Effort normal and breath sounds normal.  GI: Soft. Bowel sounds are normal.  Musculoskeletal: Normal range of motion.  Neurological: He is alert and  oriented to person, place, and time.  Skin: Skin is warm and dry.  Psychiatric:  See PHQ2/PHQ9 results.     Current Medications: Current Outpatient Prescriptions  Medication Sig Dispense Refill  . acetaminophen (TYLENOL) 500 MG tablet Take 1,000 mg by mouth daily as needed for moderate pain.     Marland Kitchen allopurinol (ZYLOPRIM) 100 MG tablet Take 1 tablet (100 mg total) by mouth daily. 90 tablet 1  . Ascorbic Acid (VITAMIN C PO) Take 1 tablet by mouth daily.    Marland Kitchen aspirin (ASPIRIN LOW DOSE) 81 MG EC tablet Take 81 mg by mouth daily.      . carvedilol (COREG) 25 MG tablet Take 1 tablet (25 mg total) by mouth 2 (two) times daily with a meal. 180 tablet 1  . chlorpheniramine-HYDROcodone (TUSSIONEX) 10-8 MG/5ML LQCR Take 5 mLs by mouth daily as needed for cough.     . Cholecalciferol (VITAMIN D PO) Take 2 tablets by mouth daily.    . furosemide (LASIX) 40 MG tablet Take 1 tablet (40 mg total) by mouth 2 (two) times daily. 180 tablet 1  . losartan (COZAAR) 25 MG tablet Take 1 tablet (25 mg total) by mouth daily. 90 tablet 1  . mirtazapine (REMERON) 15 MG tablet Take 1 tablet (15 mg total) by mouth at bedtime. 30 tablet 3  . nitroGLYCERIN (NITROSTAT) 0.4 MG SL tablet Place 1 tablet (0.4 mg total) under the tongue every 5 (five) minutes as needed for chest pain. For chest pain 30 tablet 6  . NON FORMULARY 2 mLs. Oxygen. Daily at  night and PRN during the day.     Marland Kitchen omeprazole (PRILOSEC) 20 MG capsule Take 1 capsule (20 mg total) by mouth daily. 30 capsule 3  . spironolactone (ALDACTONE) 25 MG tablet Take 1 tablet (25 mg total) by mouth daily. 90 tablet 1  . temazepam (RESTORIL) 30 MG capsule Take 1 capsule (30 mg total) by mouth at bedtime as needed for sleep. 90 capsule 1  . traMADol (ULTRAM) 50 MG tablet Take 1 tablet (50 mg total) by mouth every 6 (six) hours as needed. (Patient taking differently: Take 50 mg by mouth every 6 (six) hours as needed for moderate pain. ) 90 tablet 1   No current  facility-administered medications for this visit.    Functional Status: In your present state of health, do you have any difficulty performing the following activities: 04/04/2014  Is the patient deaf or have difficulty hearing? N  Hearing N  Vision N  Difficulty concentrating or making decisions N  Walking or climbing stairs? N  Doing errands, shopping? N  Preparing Food and eating ? N  Using the Toilet? N  In the past six months, have you accidently leaked urine? N  Do you have problems with loss of bowel control? N  Managing your Medications? Y  Managing your Finances? N  Housekeeping or managing your Housekeeping? N    Fall/Depression Screening: PHQ 2/9 Scores 11/30/2013 12/16/2012  PHQ - 2 Score 3 1  PHQ- 9 Score 5 -    Assessment:   Chronic Health Condition (CHF) - [name] Macias was last hospitalized with NSTEMI and CHF in January of this year; he has an EF of 15-20% and has an implanted defibrillator; he is taking medications as prescribed and using Home O2 qhs and prn during the day; [name] Macias is weighing daily but did not have a good understanding of signs and symptoms of worsening CHF and did not understand when to call for help. Education provided as outlined in care plan.  *[name] Macias indicates worsening dyspnea on exertion over the last 6 months. His O2 sat resting on RA was 98%; I didn't perform exercise testing as [name] Macias was actively grieving the loss of his daughter; I will assess exercise RA sat on my next visit in a few weeks.*   Acute Health Condition (depression/insomnia; grief/loss) - [name] Macias is actively grieving the sudden/unexpected loss of his 13 year old daughter. She died in surgery about a month ago.   [name] Macias requests consideration of change to sleep therapy, consideration of anti-depressant, and referral for counseling/grief support services.      Plan:  Request consideration of the following items by primary care provider:  1) Lightweight/portable O2  tank for trips away from home. Huey Romans is provider.  2) Ntg  - needs new Rx faxed to pharmacy. 3) Abdominal discomfort - Complains of abdominal discomfort on Omeprazole 20mg  qd (previously on 40mg  qd) and requests recommendation from provider. 4) Insomnia - on Mirtazapine 30mg  qhs and not sleeping well ("at all") since unexpected death of daughter - requests recommendation for sleep distrubance.  5) Depression - patient requests consideration by primary care provider for anti-depressant therapy.    Referred to LCSW Theadore Nan for grief support/counseling.   Patient will:   1) Weigh daily and record, calling for weight gain of 3# overnight or 5# in a week.  2) Take all medications as prescribed.  3)  Call for worsening health symptoms/concerns.    Next [facility] CM appointment: 04/27/2014 10:30am.  [facility]  [address] [place], [place] [address] [email or web address]   [phone] (mobile)  [phone] (main office) [phone] (fax)         [place]        Patient Outreach from 04/04/2014 in [place] Problem One  Grief/Loss   Care Plan for Problem One  Active   Interventions for Problem One Long Term Goal  notified provider of PHQ2/9, patient requests,  referral to LCSW   [facility] Long Term Goal (31-90 days)  Patient will receive appropriate care and support for grief/loss issues in the next 31 days   [facility] Long Term Goal Start Date  04/04/14   [facility] CM Short Term Goal #1 (0-30 days)  Patient will work with LCSW for counseling/grief support resources over the next 30 days as evidenced by [facility] documentation and patient report   [facility] CM Short Term Goal #1 Start Date  04/04/14   [facility] CM Short Term Goal #2 (0-30 days)  Patient will take medications as prescribed for treatment of insomnia and depression over the next 30 days as evidenced by patient report and provider  review   [facility] CM Short Term Goal #2 Start Date  04/04/14   Care Plan Problem Two  CHF Symptom Management   Care Plan for Problem Two  Active   Interventions for Problem Two Long Term Goal   notified primary care/pulmonary provider of patient report of worsening symptoms and request for further evaluation and for continuous O2   [facility] Long Term Goal (31-90) days  Patient will report improvement in dyspnea on exertion during daily activities and trips away from home in the next 31 days   [facility] Long Term Goal Start Date  04/04/14   [facility] CM Short Term Goal #1 (0-30 days)  patient will have appropriate evaluation of cardiopulmonary status and need for change in O2 therapy   [facility] CM Short Term Goal #1 Start Date  04/04/14   [facility] CM Short Term Goal #2 (0-30 days)  patient will weigh daily and document, calling for weight gain of 3# overnight or 5# in a week OR worsening symptoms over the next 30 days   [facility] CM Short Term Goal #2 Start Date  04/04/14

## 2014-04-04 NOTE — Patient Instructions (Signed)

## 2014-04-05 ENCOUNTER — Encounter: Payer: Self-pay | Admitting: *Deleted

## 2014-04-22 ENCOUNTER — Emergency Department (HOSPITAL_COMMUNITY): Payer: Commercial Managed Care - HMO

## 2014-04-22 ENCOUNTER — Encounter (HOSPITAL_COMMUNITY): Payer: Self-pay | Admitting: *Deleted

## 2014-04-22 ENCOUNTER — Emergency Department (HOSPITAL_COMMUNITY)
Admission: EM | Admit: 2014-04-22 | Discharge: 2014-04-22 | Disposition: A | Discharge status: Home / Self Care | Payer: Commercial Managed Care - HMO | Attending: Emergency Medicine | Admitting: Emergency Medicine

## 2014-04-22 DIAGNOSIS — Z7982 Long term (current) use of aspirin: Secondary | ICD-10-CM | POA: Insufficient documentation

## 2014-04-22 DIAGNOSIS — M1990 Unspecified osteoarthritis, unspecified site: Secondary | ICD-10-CM | POA: Insufficient documentation

## 2014-04-22 DIAGNOSIS — Z950 Presence of cardiac pacemaker: Secondary | ICD-10-CM | POA: Diagnosis not present

## 2014-04-22 DIAGNOSIS — I25119 Atherosclerotic heart disease of native coronary artery with unspecified angina pectoris: Secondary | ICD-10-CM | POA: Diagnosis not present

## 2014-04-22 DIAGNOSIS — R1030 Lower abdominal pain, unspecified: Secondary | ICD-10-CM | POA: Diagnosis not present

## 2014-04-22 DIAGNOSIS — Z87438 Personal history of other diseases of male genital organs: Secondary | ICD-10-CM | POA: Diagnosis not present

## 2014-04-22 DIAGNOSIS — Z87891 Personal history of nicotine dependence: Secondary | ICD-10-CM | POA: Diagnosis not present

## 2014-04-22 DIAGNOSIS — F0390 Unspecified dementia without behavioral disturbance: Secondary | ICD-10-CM | POA: Insufficient documentation

## 2014-04-22 DIAGNOSIS — J449 Chronic obstructive pulmonary disease, unspecified: Secondary | ICD-10-CM | POA: Insufficient documentation

## 2014-04-22 DIAGNOSIS — Z79899 Other long term (current) drug therapy: Secondary | ICD-10-CM | POA: Insufficient documentation

## 2014-04-22 DIAGNOSIS — F419 Anxiety disorder, unspecified: Secondary | ICD-10-CM | POA: Insufficient documentation

## 2014-04-22 DIAGNOSIS — I10 Essential (primary) hypertension: Secondary | ICD-10-CM | POA: Insufficient documentation

## 2014-04-22 DIAGNOSIS — Z8619 Personal history of other infectious and parasitic diseases: Secondary | ICD-10-CM | POA: Diagnosis not present

## 2014-04-22 DIAGNOSIS — M109 Gout, unspecified: Secondary | ICD-10-CM | POA: Diagnosis not present

## 2014-04-22 DIAGNOSIS — K219 Gastro-esophageal reflux disease without esophagitis: Secondary | ICD-10-CM | POA: Diagnosis not present

## 2014-04-22 LAB — URINALYSIS, ROUTINE W REFLEX MICROSCOPIC
Bilirubin Urine: NEGATIVE
Glucose, UA: NEGATIVE mg/dL
HGB URINE DIPSTICK: NEGATIVE
Ketones, ur: NEGATIVE mg/dL
Leukocytes, UA: NEGATIVE
Nitrite: NEGATIVE
PROTEIN: 30 mg/dL — AB
Specific Gravity, Urine: 1.015 (ref 1.005–1.030)
UROBILINOGEN UA: 0.2 mg/dL (ref 0.0–1.0)
pH: 6 (ref 5.0–8.0)

## 2014-04-22 LAB — COMPREHENSIVE METABOLIC PANEL
ALT: 23 U/L (ref 0–53)
ANION GAP: 9 (ref 5–15)
AST: 29 U/L (ref 0–37)
Albumin: 3.8 g/dL (ref 3.5–5.2)
Alkaline Phosphatase: 101 U/L (ref 39–117)
BUN: 29 mg/dL — ABNORMAL HIGH (ref 6–23)
CHLORIDE: 107 mmol/L (ref 96–112)
CO2: 26 mmol/L (ref 19–32)
CREATININE: 1.83 mg/dL — AB (ref 0.50–1.35)
Calcium: 9.8 mg/dL (ref 8.4–10.5)
GFR calc non Af Amer: 33 mL/min — ABNORMAL LOW (ref >90)
GFR, EST AFRICAN AMERICAN: 38 mL/min — AB (ref >90)
Glucose, Bld: 125 mg/dL — ABNORMAL HIGH (ref 70–99)
POTASSIUM: 4.1 mmol/L (ref 3.5–5.1)
Sodium: 142 mmol/L (ref 135–145)
Total Bilirubin: 1 mg/dL (ref 0.3–1.2)
Total Protein: 7.3 g/dL (ref 6.0–8.3)

## 2014-04-22 LAB — CBC WITH DIFFERENTIAL/PLATELET
Basophils Absolute: 0 10*3/uL (ref 0.0–0.1)
Basophils Relative: 1 % (ref 0–1)
Eosinophils Absolute: 0.1 10*3/uL (ref 0.0–0.7)
Eosinophils Relative: 2 % (ref 0–5)
HCT: 37.7 % — ABNORMAL LOW (ref 39.0–52.0)
HEMOGLOBIN: 12 g/dL — AB (ref 13.0–17.0)
LYMPHS ABS: 1 10*3/uL (ref 0.7–4.0)
Lymphocytes Relative: 16 % (ref 12–46)
MCH: 28.6 pg (ref 26.0–34.0)
MCHC: 31.8 g/dL (ref 30.0–36.0)
MCV: 90 fL (ref 78.0–100.0)
MONOS PCT: 6 % (ref 3–12)
Monocytes Absolute: 0.4 10*3/uL (ref 0.1–1.0)
NEUTROS PCT: 75 % (ref 43–77)
Neutro Abs: 4.8 10*3/uL (ref 1.7–7.7)
PLATELETS: 198 10*3/uL (ref 150–400)
RBC: 4.19 MIL/uL — AB (ref 4.22–5.81)
RDW: 14.8 % (ref 11.5–15.5)
WBC: 6.3 10*3/uL (ref 4.0–10.5)

## 2014-04-22 LAB — LIPASE, BLOOD: Lipase: 19 U/L (ref 11–59)

## 2014-04-22 LAB — URINE MICROSCOPIC-ADD ON

## 2014-04-22 MED ORDER — IOHEXOL 300 MG/ML  SOLN
50.0000 mL | Freq: Once | INTRAMUSCULAR | Status: AC | PRN
  Administered 2014-04-22: 50 mL via ORAL

## 2014-04-22 MED ORDER — IOHEXOL 300 MG/ML  SOLN
80.0000 mL | Freq: Once | INTRAMUSCULAR | Status: AC | PRN
  Administered 2014-04-22: 80 mL via INTRAVENOUS

## 2014-04-22 MED ORDER — MORPHINE SULFATE 4 MG/ML IJ SOLN
4.0000 mg | Freq: Once | INTRAMUSCULAR | Status: AC
  Administered 2014-04-22: 4 mg via INTRAVENOUS
  Filled 2014-04-22: qty 1

## 2014-04-22 MED ORDER — ONDANSETRON HCL 4 MG/2ML IJ SOLN
4.0000 mg | Freq: Once | INTRAMUSCULAR | Status: AC
  Administered 2014-04-22: 4 mg via INTRAVENOUS
  Filled 2014-04-22: qty 2

## 2014-04-22 MED ORDER — PROMETHAZINE HCL 25 MG PO TABS: 25.0000 mg | ORAL_TABLET | Freq: Four times a day (QID) | ORAL | Status: DC | PRN

## 2014-04-22 MED ORDER — SODIUM CHLORIDE 0.9 % IV BOLUS (SEPSIS)
500.0000 mL | Freq: Once | INTRAVENOUS | Status: AC
  Administered 2014-04-22: 500 mL via INTRAVENOUS

## 2014-04-22 MED ORDER — OXYCODONE-ACETAMINOPHEN 5-325 MG PO TABS: 1.0000 | ORAL_TABLET | ORAL | Status: DC | PRN

## 2014-04-22 MED ORDER — MORPHINE SULFATE 4 MG/ML IJ SOLN
4.0000 mg | Freq: Once | INTRAMUSCULAR | Status: AC
Start: 2014-04-22 — End: 2014-04-22
  Administered 2014-04-22: 4 mg via INTRAVENOUS
  Filled 2014-04-22: qty 1

## 2014-04-22 NOTE — ED Notes (Signed)
abd pain, worset 11 am.  Vomited x 1 .  Alert, talking.

## 2014-04-22 NOTE — ED Provider Notes (Addendum)
CSN: [id number]     Arrival date & time 04/22/14  1140 History  This chart was scribed for [name] Christen, MD by Edison Simon, ED Scribe. This patient was seen in room [facility]06/[facility]06 and the patient's care was started at 12:39 PM.    Chief Complaint  Patient presents with  . Abdominal Pain   The history is provided by the patient. No language interpreter was used.    HPI Comments: [name] Macias is a 81 y.o. male who presents to the Emergency Department complaining of lower abdominal pain, worse this morning. He reports nausea and 1 count of vomiting this morning. He reports decreased appetite. He states he did not sleep well last night. He reports similar abdominal pains "all the time." Relative states he was evaluated here for similar symptoms in January and kept overnight at [facility]. He was discharged and states he was doing well since then. Relative states he was previously diagnosed with a hernia to groin area, but patient is not able to detect it. He denies any prior abdominal surgeries. He denies chest pain.  Past Medical History  Diagnosis Date  . Arthritis   . Gout   . Non-ischemic cardiomyopathy   . Hypertension   . Arthritis     left foot   . CAD (coronary artery disease) of artery bypass graft   . COPD (chronic obstructive pulmonary disease)   . History of syphilis   . Type 2 HSV infection of penis   . Erectile dysfunction   . Anxiety disorder   . Dementia   . Anxiety   . Angina   . GERD (gastroesophageal reflux disease)   . Ascending aortic aneurysm   . Lung nodule     RUL nodule noted on abdominal CT   Past Surgical History  Procedure Laterality Date  . Left ankle      Not Done by Dr. Aline Brochure   . S/p  biventricular pacemaker / aicd implantation  02/19/05  . Defribrillator implant    . Crtd upgrade  12/14/2009  . Transthoracic echocardiogram  2011  . Doppler echocardiography  2009  . Insert / replace / remove pacemaker    . Esophagogastroduodenoscopy N/A 07/01/2013     Procedure: ESOPHAGOGASTRODUODENOSCOPY (EGD);  Surgeon: Rogene Houston, MD;  Location: [facility];  Service: Endoscopy;  Laterality: N/A;  145-moved to 1245 Ann notfied pt   Family History  Problem Relation Age of Onset  . Heart failure Father   . Prostate cancer Brother   . Diabetes Brother   . Hypertension Brother   . Heart failure Brother    History  Substance Use Topics  . Smoking status: Former Smoker -- 1.00 packs/day for 10 years    Types: Cigarettes    Quit date: 01/15/1963  . Smokeless tobacco: Never Used  . Alcohol Use: No    Review of Systems A complete 10 system review of systems was obtained and all systems are negative except as noted in the HPI and PMH.    Allergies  Codeine; Enalapril maleate; and Sulfonamide derivatives  Home Medications   Prior to Admission medications   Medication Sig Start Date End Date Taking? Authorizing Provider  allopurinol (ZYLOPRIM) 100 MG tablet Take 1 tablet (100 mg total) by mouth daily. 11/03/13  Yes Fayrene Helper, MD  Ascorbic Acid (VITAMIN C PO) Take 1 tablet by mouth daily.   Yes Historical Provider, MD  aspirin (ASPIRIN LOW DOSE) 81 MG EC tablet Take 81 mg by mouth  daily.     Yes Historical Provider, MD  carvedilol (COREG) 25 MG tablet Take 1 tablet (25 mg total) by mouth 2 (two) times daily with a meal. 11/03/13  Yes Fayrene Helper, MD  Cholecalciferol (VITAMIN D PO) Take 2 tablets by mouth daily.   Yes Historical Provider, MD  furosemide (LASIX) 40 MG tablet Take 1 tablet (40 mg total) by mouth 2 (two) times daily. 11/03/13  Yes Fayrene Helper, MD  losartan (COZAAR) 25 MG tablet Take 1 tablet (25 mg total) by mouth daily. 11/03/13  Yes Fayrene Helper, MD  mirtazapine (REMERON) 15 MG tablet Take 1 tablet (15 mg total) by mouth at bedtime. 11/30/13  Yes Fayrene Helper, MD  omeprazole (PRILOSEC) 20 MG capsule Take 1 capsule (20 mg total) by mouth daily. 10/11/13  Yes Butch Penny, NP  spironolactone  (ALDACTONE) 25 MG tablet Take 1 tablet (25 mg total) by mouth daily. 11/03/13  Yes Fayrene Helper, MD  temazepam (RESTORIL) 30 MG capsule Take 1 capsule (30 mg total) by mouth at bedtime as needed for sleep. 11/29/13 05/30/14 Yes Fayrene Helper, MD  acetaminophen (TYLENOL) 500 MG tablet Take 1,000 mg by mouth daily as needed for moderate pain.     Historical Provider, MD  nitroGLYCERIN (NITROSTAT) 0.4 MG SL tablet Place 1 tablet (0.4 mg total) under the tongue every 5 (five) minutes as needed for chest pain. For chest pain 03/18/14   Evans Lance, MD  NON FORMULARY 2 mLs. Oxygen. Daily at night and PRN during the day.     Historical Provider, MD  oxyCODONE-acetaminophen (PERCOCET) 5-325 MG per tablet Take 1 tablet by mouth every 4 (four) hours as needed. 04/22/14   [name] Christen, MD  promethazine (PHENERGAN) 25 MG tablet Take 1 tablet (25 mg total) by mouth every 6 (six) hours as needed. 04/22/14   [name] Christen, MD  sertraline (ZOLOFT) 50 MG tablet Take 50 mg by mouth daily. 04/11/14   Historical Provider, MD  traMADol (ULTRAM) 50 MG tablet Take 1 tablet (50 mg total) by mouth every 6 (six) hours as needed. Patient taking differently: Take 50 mg by mouth every 6 (six) hours as needed for moderate pain.  11/29/13   Fayrene Helper, MD   BP 114/73 mmHg  Pulse 90  Temp(Src) 97.6 F (36.4 C) (Oral)  Resp 18  Ht 5\' 8"  (1.727 m)  Wt 165 lb (74.844 kg)  BMI 25.09 kg/m2  SpO2 99% Physical Exam  Constitutional: He is oriented to person, place, and time. He appears well-developed and well-nourished.  HENT:  Head: Normocephalic and atraumatic.  Eyes: Conjunctivae and EOM are normal. Pupils are equal, round, and reactive to light.  Neck: Normal range of motion. Neck supple.  Cardiovascular: Normal rate and regular rhythm.   Pulmonary/Chest: Effort normal and breath sounds normal.  Abdominal: Soft. Bowel sounds are normal. There is tenderness (minimally tender in lower abdomen).  Musculoskeletal:  Normal range of motion.  Neurological: He is alert and oriented to person, place, and time.  Skin: Skin is warm and dry.  Psychiatric: He has a normal mood and affect. His behavior is normal.  Nursing note and vitals reviewed.   ED Course  Procedures (including critical care time)  DIAGNOSTIC STUDIES: Oxygen Saturation is 92% on room air, adequate by my interpretation.    COORDINATION OF CARE: 12:45 PM Discussed treatment plan with patient at beside, including labs, x-ray, and IV fluids.Tthe patient agrees with the plan and  has no further questions at this time.   Labs Review Labs Reviewed  CBC WITH DIFFERENTIAL/PLATELET - Abnormal; Notable for the following:    RBC 4.19 (*)    Hemoglobin 12.0 (*)    HCT 37.7 (*)    All other components within normal limits  COMPREHENSIVE METABOLIC PANEL - Abnormal; Notable for the following:    Glucose, Bld 125 (*)    BUN 29 (*)    Creatinine, Ser 1.83 (*)    GFR calc non Af Amer 33 (*)    GFR calc Af Amer 38 (*)    All other components within normal limits  URINALYSIS, ROUTINE W REFLEX MICROSCOPIC - Abnormal; Notable for the following:    Protein, ur 30 (*)    All other components within normal limits  URINE MICROSCOPIC-ADD ON  LIPASE, BLOOD    Imaging Review Ct Abdomen Pelvis W Contrast  04/22/2014   CLINICAL DATA:  Lower abdominal pain increasing this morning. Nausea and vomiting.  EXAM: CT ABDOMEN AND PELVIS WITH CONTRAST  TECHNIQUE: Multidetector CT imaging of the abdomen and pelvis was performed using the standard protocol following bolus administration of intravenous contrast.  CONTRAST:  71mL OMNIPAQUE IOHEXOL 300 MG/ML SOLN, 12mL OMNIPAQUE IOHEXOL 300 MG/ML SOLN  COMPARISON:  CT 11/24/2014  FINDINGS: Lower chest: Small bilateral pleural effusions. Mild basilar atelectasis. Heart is enlarged.  Hepatobiliary: No focal hepatic lesion. No biliary duct dilatation. Gallbladder is normal. Common bile duct is normal.  Pancreas: Pancreas is  normal. No ductal dilatation. No pancreatic inflammation.  Spleen: Normal spleen  Adrenals/urinary tract: Adrenal glands are normal. The are two simple fluid lesions within the left kidney. No ureterolithiasis or obstructive uropathy. The bladder is normal.  Stomach/Bowel: Stomach, small bowel and cecum are normal. Oral contrast transients the entirety of the small bowel and colon. Appendix is not identified but there are no secondary signs of appendicitis. There are multiple diverticula throughout the colon without acute inflammation. Rectum is normal.  Vascular/Lymphatic: Abdominal aorta is normal caliber with atherosclerotic calcification. There is no retroperitoneal or periportal lymphadenopathy. No pelvic lymphadenopathy.  Reproductive: Prostate gland is normal. Bilateral fat filled inguinal hernias.  Musculoskeletal: No aggressive osseous lesion.  Other: No free fluid.  IMPRESSION: 1. No acute abdominal or pelvic findings. 2. Extensive colonic diverticulosis without evidence diverticulitis. 3. No bowel obstruction or inflammation. 4. Cardiomegaly and pleural effusions.   Electronically Signed   By: Suzy Bouchard M.D.   On: 04/22/2014 16:00     EKG Interpretation   Date/Time:  Friday April 22 2014 11:54:14 EDT Ventricular Rate:  91 PR Interval:  202 QRS Duration: 163 QT Interval:  420 QTC Calculation: 517 R Axis:   -73 Text Interpretation:  Sinus rhythm Atrial premature complexes IVCD,  consider atypical RBBB Inferior infarct, acute (RCA) Probable RV  involvement, suggest recording right precordial leads Confirmed by Emmalyn Hinson   MD, Oretha Weismann ([id number]) on 04/22/2014 1:25:55 PM      MDM   Final diagnoses:  Lower abdominal pain    No acute abdomen. CT scan shows no acute findings. White count normal. Urinalysis normal. Discharge medications Percocet and Phenergan 25 mg  I personally performed the services described in this documentation, which was scribed in my presence. The recorded information  has been reviewed and is accurate.   [name] Christen, MD 04/22/14 1606  [name] Christen, MD 04/22/14 [phone]

## 2014-04-22 NOTE — Discharge Instructions (Signed)
Abdominal Pain Many things can cause belly (abdominal) pain. Most times, the belly pain is not dangerous. Many cases of belly pain can be watched and treated at home. HOME CARE   Do not take medicines that help you go poop (laxatives) unless told to by your doctor.  Only take medicine as told by your doctor.  Eat or drink as told by your doctor. Your doctor will tell you if you should be on a special diet. GET HELP IF:  You do not know what is causing your belly pain.  You have belly pain while you are sick to your stomach (nauseous) or have runny poop (diarrhea).  You have pain while you pee or poop.  Your belly pain wakes you up at night.  You have belly pain that gets worse or better when you eat.  You have belly pain that gets worse when you eat fatty foods.  You have a fever. GET HELP RIGHT AWAY IF:   The pain does not go away within 2 hours.  You keep throwing up (vomiting).  The pain changes and is only in the right or left part of the belly.  You have bloody or tarry looking poop. MAKE SURE YOU:   Understand these instructions.  Will watch your condition.  Will get help right away if you are not doing well or get worse. Document Released: 06/19/2007 Document Revised: 01/05/2013 Document Reviewed: 09/09/2012 [facility] Patient Information 2015 [place], [place]. This information is not intended to replace advice given to you by your health care provider. Make sure you discuss any questions you have with your health care provider.   CT scan showed no life-threatening problems. Prescriptions for pain and nausea medicine. Follow-up your primary care doctor or return if worse

## 2014-04-27 ENCOUNTER — Ambulatory Visit: Payer: Medicare HMO | Admitting: *Deleted

## 2014-05-05 ENCOUNTER — Encounter: Payer: Self-pay | Admitting: *Deleted

## 2014-05-05 NOTE — Progress Notes (Signed)
This encounter was created in error - please disregard.

## 2014-05-06 ENCOUNTER — Encounter: Payer: Self-pay | Admitting: *Deleted

## 2014-05-10 ENCOUNTER — Encounter (HOSPITAL_COMMUNITY): Payer: Self-pay | Admitting: Emergency Medicine

## 2014-05-10 ENCOUNTER — Emergency Department (HOSPITAL_COMMUNITY): Payer: Commercial Managed Care - HMO

## 2014-05-10 ENCOUNTER — Emergency Department (HOSPITAL_COMMUNITY)
Admission: EM | Admit: 2014-05-10 | Discharge: 2014-05-10 | Disposition: A | Discharge status: Home / Self Care | Payer: Commercial Managed Care - HMO | Attending: Emergency Medicine | Admitting: Emergency Medicine

## 2014-05-10 ENCOUNTER — Other Ambulatory Visit: Payer: Self-pay | Admitting: Family Medicine

## 2014-05-10 DIAGNOSIS — J449 Chronic obstructive pulmonary disease, unspecified: Secondary | ICD-10-CM | POA: Diagnosis not present

## 2014-05-10 DIAGNOSIS — Z8673 Personal history of transient ischemic attack (TIA), and cerebral infarction without residual deficits: Secondary | ICD-10-CM | POA: Diagnosis not present

## 2014-05-10 DIAGNOSIS — Z79899 Other long term (current) drug therapy: Secondary | ICD-10-CM | POA: Insufficient documentation

## 2014-05-10 DIAGNOSIS — Z87438 Personal history of other diseases of male genital organs: Secondary | ICD-10-CM | POA: Diagnosis not present

## 2014-05-10 DIAGNOSIS — F0390 Unspecified dementia without behavioral disturbance: Secondary | ICD-10-CM | POA: Insufficient documentation

## 2014-05-10 DIAGNOSIS — I2510 Atherosclerotic heart disease of native coronary artery without angina pectoris: Secondary | ICD-10-CM | POA: Insufficient documentation

## 2014-05-10 DIAGNOSIS — Z7982 Long term (current) use of aspirin: Secondary | ICD-10-CM | POA: Diagnosis not present

## 2014-05-10 DIAGNOSIS — Z8619 Personal history of other infectious and parasitic diseases: Secondary | ICD-10-CM | POA: Insufficient documentation

## 2014-05-10 DIAGNOSIS — M1990 Unspecified osteoarthritis, unspecified site: Secondary | ICD-10-CM | POA: Insufficient documentation

## 2014-05-10 DIAGNOSIS — I10 Essential (primary) hypertension: Secondary | ICD-10-CM | POA: Insufficient documentation

## 2014-05-10 DIAGNOSIS — R1084 Generalized abdominal pain: Secondary | ICD-10-CM | POA: Insufficient documentation

## 2014-05-10 DIAGNOSIS — K219 Gastro-esophageal reflux disease without esophagitis: Secondary | ICD-10-CM | POA: Diagnosis not present

## 2014-05-10 DIAGNOSIS — R112 Nausea with vomiting, unspecified: Secondary | ICD-10-CM | POA: Insufficient documentation

## 2014-05-10 DIAGNOSIS — M109 Gout, unspecified: Secondary | ICD-10-CM | POA: Insufficient documentation

## 2014-05-10 DIAGNOSIS — Z87891 Personal history of nicotine dependence: Secondary | ICD-10-CM | POA: Diagnosis not present

## 2014-05-10 LAB — CBC WITH DIFFERENTIAL/PLATELET
Basophils Absolute: 0 10*3/uL (ref 0.0–0.1)
Basophils Relative: 0 % (ref 0–1)
EOS ABS: 0 10*3/uL (ref 0.0–0.7)
Eosinophils Relative: 0 % (ref 0–5)
HCT: 41 % (ref 39.0–52.0)
Hemoglobin: 13 g/dL (ref 13.0–17.0)
LYMPHS PCT: 21 % (ref 12–46)
Lymphs Abs: 1.6 10*3/uL (ref 0.7–4.0)
MCH: 28.7 pg (ref 26.0–34.0)
MCHC: 31.7 g/dL (ref 30.0–36.0)
MCV: 90.5 fL (ref 78.0–100.0)
Monocytes Absolute: 0.6 10*3/uL (ref 0.1–1.0)
Monocytes Relative: 8 % (ref 3–12)
NEUTROS PCT: 71 % (ref 43–77)
Neutro Abs: 5.3 10*3/uL (ref 1.7–7.7)
Platelets: 253 10*3/uL (ref 150–400)
RBC: 4.53 MIL/uL (ref 4.22–5.81)
RDW: 15.1 % (ref 11.5–15.5)
WBC: 7.5 10*3/uL (ref 4.0–10.5)

## 2014-05-10 LAB — URINE MICROSCOPIC-ADD ON

## 2014-05-10 LAB — HEPATIC FUNCTION PANEL
ALBUMIN: 4.1 g/dL (ref 3.5–5.2)
ALK PHOS: 89 U/L (ref 39–117)
ALT: 14 U/L (ref 0–53)
AST: 22 U/L (ref 0–37)
Bilirubin, Direct: 0.2 mg/dL (ref 0.0–0.5)
Indirect Bilirubin: 1.2 mg/dL — ABNORMAL HIGH (ref 0.3–0.9)
Total Bilirubin: 1.4 mg/dL — ABNORMAL HIGH (ref 0.3–1.2)
Total Protein: 7.8 g/dL (ref 6.0–8.3)

## 2014-05-10 LAB — BASIC METABOLIC PANEL
ANION GAP: 13 (ref 5–15)
BUN: 27 mg/dL — AB (ref 6–23)
CO2: 25 mmol/L (ref 19–32)
Calcium: 9.8 mg/dL (ref 8.4–10.5)
Chloride: 109 mmol/L (ref 96–112)
Creatinine, Ser: 1.6 mg/dL — ABNORMAL HIGH (ref 0.50–1.35)
GFR calc Af Amer: 45 mL/min — ABNORMAL LOW (ref >90)
GFR calc non Af Amer: 39 mL/min — ABNORMAL LOW (ref >90)
GLUCOSE: 113 mg/dL — AB (ref 70–99)
Potassium: 4.1 mmol/L (ref 3.5–5.1)
Sodium: 147 mmol/L — ABNORMAL HIGH (ref 135–145)

## 2014-05-10 LAB — URINALYSIS, ROUTINE W REFLEX MICROSCOPIC
Glucose, UA: NEGATIVE mg/dL
Ketones, ur: NEGATIVE mg/dL
LEUKOCYTES UA: NEGATIVE
Nitrite: NEGATIVE
Protein, ur: 30 mg/dL — AB
SPECIFIC GRAVITY, URINE: 1.025 (ref 1.005–1.030)
Urobilinogen, UA: 1 mg/dL (ref 0.0–1.0)
pH: 5.5 (ref 5.0–8.0)

## 2014-05-10 LAB — LIPASE, BLOOD: Lipase: 31 U/L (ref 11–59)

## 2014-05-10 MED ORDER — IPRATROPIUM-ALBUTEROL 0.5-2.5 (3) MG/3ML IN SOLN
3.0000 mL | Freq: Once | RESPIRATORY_TRACT | Status: AC
  Administered 2014-05-10: 3 mL via RESPIRATORY_TRACT
  Filled 2014-05-10: qty 3

## 2014-05-10 MED ORDER — PANTOPRAZOLE SODIUM 40 MG PO TBEC
40.0000 mg | DELAYED_RELEASE_TABLET | Freq: Once | ORAL | Status: AC
  Administered 2014-05-10: 40 mg via ORAL
  Filled 2014-05-10: qty 1

## 2014-05-10 MED ORDER — IOHEXOL 300 MG/ML  SOLN
100.0000 mL | Freq: Once | INTRAMUSCULAR | Status: AC | PRN
  Administered 2014-05-10: 100 mL via INTRAVENOUS

## 2014-05-10 MED ORDER — ONDANSETRON 4 MG PO TBDP
4.0000 mg | ORAL_TABLET | Freq: Three times a day (TID) | ORAL | Status: DC | PRN
Start: 2014-05-10 — End: 2014-10-31

## 2014-05-10 MED ORDER — FENTANYL CITRATE (PF) 100 MCG/2ML IJ SOLN
50.0000 ug | Freq: Once | INTRAMUSCULAR | Status: AC
  Administered 2014-05-10: 50 ug via INTRAVENOUS
  Filled 2014-05-10: qty 2

## 2014-05-10 MED ORDER — OXYCODONE-ACETAMINOPHEN 5-325 MG PO TABS: 1.0000 | ORAL_TABLET | Freq: Four times a day (QID) | ORAL | Status: DC | PRN

## 2014-05-10 MED ORDER — IOHEXOL 300 MG/ML  SOLN
25.0000 mL | Freq: Once | INTRAMUSCULAR | Status: AC | PRN
  Administered 2014-05-10: 25 mL via ORAL

## 2014-05-10 MED ORDER — ONDANSETRON HCL 4 MG/2ML IJ SOLN
4.0000 mg | Freq: Once | INTRAMUSCULAR | Status: AC
  Administered 2014-05-10: 4 mg via INTRAVENOUS
  Filled 2014-05-10: qty 2

## 2014-05-10 NOTE — ED Provider Notes (Signed)
TIME SEEN: 6:20 PM  CHIEF COMPLAINT: Abdominal pain, vomiting  HPI: Pt is a 81 y.o. male with history of nonischemic myopathy, CAD, COPD, dementia who presents to the emergency department with intermittent diffuse crampy abdominal pain, nausea and vomiting that started today. Patient's daughter is at bedside reports he has had these symptoms intermittently for several months. States that they have never found a reason for him to have pain. Denies that he's had any diarrhea, placed or melena. No dysuria or hematuria. No recent travel. No sick contacts.  ROS: See HPI Constitutional: no fever  Eyes: no drainage  ENT: no runny nose   Cardiovascular:  no chest pain  Resp: no SOB  GI:  vomiting GU: no dysuria Integumentary: no rash  Allergy: no hives  Musculoskeletal: no leg swelling  Neurological: no slurred speech ROS otherwise negative  PAST MEDICAL HISTORY/PAST SURGICAL HISTORY:  Past Medical History  Diagnosis Date  . Arthritis   . Gout   . Non-ischemic cardiomyopathy   . Hypertension   . Arthritis     left foot   . CAD (coronary artery disease) of artery bypass graft   . COPD (chronic obstructive pulmonary disease)   . History of syphilis   . Type 2 HSV infection of penis   . Erectile dysfunction   . Anxiety disorder   . Dementia   . Anxiety   . Angina   . GERD (gastroesophageal reflux disease)   . Ascending aortic aneurysm   . Lung nodule     RUL nodule noted on abdominal CT    MEDICATIONS:  Prior to Admission medications   Medication Sig Start Date End Date Taking? Authorizing Provider  acetaminophen (TYLENOL) 500 MG tablet Take 1,000 mg by mouth daily as needed for moderate pain.     Historical Provider, MD  allopurinol (ZYLOPRIM) 100 MG tablet Take 1 tablet (100 mg total) by mouth daily. 11/03/13   Fayrene Helper, MD  Ascorbic Acid (VITAMIN C PO) Take 1 tablet by mouth daily.    Historical Provider, MD  aspirin (ASPIRIN LOW DOSE) 81 MG EC tablet Take 81 mg by  mouth daily.      Historical Provider, MD  carvedilol (COREG) 25 MG tablet Take 1 tablet (25 mg total) by mouth 2 (two) times daily with a meal. 11/03/13   Fayrene Helper, MD  Cholecalciferol (VITAMIN D PO) Take 2 tablets by mouth daily.    Historical Provider, MD  furosemide (LASIX) 40 MG tablet Take 1 tablet (40 mg total) by mouth 2 (two) times daily. 11/03/13   Fayrene Helper, MD  losartan (COZAAR) 25 MG tablet Take 1 tablet (25 mg total) by mouth daily. 11/03/13   Fayrene Helper, MD  mirtazapine (REMERON) 15 MG tablet TAKE 1 TABLET BY MOUTH AT BEDTIME 05/10/14   Fayrene Helper, MD  nitroGLYCERIN (NITROSTAT) 0.4 MG SL tablet Place 1 tablet (0.4 mg total) under the tongue every 5 (five) minutes as needed for chest pain. For chest pain 03/18/14   Evans Lance, MD  NON FORMULARY 2 mLs. Oxygen. Daily at night and PRN during the day.     Historical Provider, MD  omeprazole (PRILOSEC) 20 MG capsule Take 1 capsule (20 mg total) by mouth daily. 10/11/13   Butch Penny, NP  oxyCODONE-acetaminophen (PERCOCET) 5-325 MG per tablet Take 1 tablet by mouth every 4 (four) hours as needed. 04/22/14   [name], MD  promethazine (PHENERGAN) 25 MG tablet Take 1 tablet (25  mg total) by mouth every 6 (six) hours as needed. 04/22/14   [name], MD  sertraline (ZOLOFT) 50 MG tablet Take 50 mg by mouth daily. 04/11/14   Historical Provider, MD  spironolactone (ALDACTONE) 25 MG tablet Take 1 tablet (25 mg total) by mouth daily. 11/03/13   Fayrene Helper, MD  temazepam (RESTORIL) 30 MG capsule Take 1 capsule (30 mg total) by mouth at bedtime as needed for sleep. 11/29/13 05/30/14  Fayrene Helper, MD  traMADol (ULTRAM) 50 MG tablet Take 1 tablet (50 mg total) by mouth every 6 (six) hours as needed. Patient taking differently: Take 50 mg by mouth every 6 (six) hours as needed for moderate pain.  11/29/13   Fayrene Helper, MD    ALLERGIES:  Allergies  Allergen Reactions  . Codeine Other (See  Comments)    Whelps up  . Enalapril Maleate Other (See Comments)    Whelps up  . Sulfonamide Derivatives Other (See Comments)    Whelps up    SOCIAL HISTORY:  History  Substance Use Topics  . Smoking status: Former Smoker -- 1.00 packs/day for 10 years    Types: Cigarettes    Quit date: 01/15/1963  . Smokeless tobacco: Never Used  . Alcohol Use: No    FAMILY HISTORY: Family History  Problem Relation Age of Onset  . Heart failure Father   . Prostate cancer Brother   . Diabetes Brother   . Hypertension Brother   . Heart failure Brother     EXAM: BP 136/113 mmHg  Pulse 92  Temp(Src) 97.6 F (36.4 C) (Oral)  Resp 27  Ht 5\' 8"  (1.727 m)  Wt 189 lb (85.73 kg)  BMI 28.74 kg/m2  SpO2 98% CONSTITUTIONAL: Alert and oriented and responds appropriately to questions. Well-appearing; well-nourished, elderly, pleasant, in no significant distress, nontoxic HEAD: Normocephalic EYES: Conjunctivae clear, PERRL ENT: normal nose; no rhinorrhea; moist mucous membranes; pharynx without lesions noted NECK: Supple, no meningismus, no LAD  CARD: RRR; S1 and S2 appreciated; no murmurs, no clicks, no rubs, no gallops RESP: Normal chest excursion without splinting or tachypnea; breath sounds clear and equal bilaterally; no wheezes, no rhonchi, no rales,  ABD/GI: Normal bowel sounds; non-distended; soft, diffusely tender to palpation without guarding or rebound, no peritoneal signs BACK:  The back appears normal and is non-tender to palpation, there is no CVA tenderness EXT: Normal ROM in all joints; non-tender to palpation; no edema; normal capillary refill; no cyanosis    SKIN: Normal color for age and race; warm NEURO: Moves all extremities equally PSYCH: The patient's mood and manner are appropriate. Grooming and personal hygiene are appropriate.  MEDICAL DECISION MAKING: Patient here with abdominal pain. Has had multiple similar episodes of the same. Labs ordered in triage are unremarkable  other than mildly elevated bilirubin. Creatinine is elevated but at baseline. Urine shows hemoglobin but no other sign of infection. Repeat CT scan shows no acute abnormality that could cause his pain. Have advised him to follow-up with their gastroenterologist Dr. Laural Golden as he may need endoscopy and colonoscopy given persistent, intermittent symptoms. We'll discharge with prescription for Percocet and Zofran for pain and nausea. Discussed return precautions. They verbalize understanding and are comfortable with plan. Patient tolerating by mouth without difficulty. No vomiting in the ED. On discharge, patient is smiling and laughing. Reports feeling better after fentanyl.      [place], DO 05/10/14 2151

## 2014-05-10 NOTE — ED Notes (Signed)
Patient complaining of abdominal pain x 2 weeks worsening last night. Also complaining of vomiting. States he was here two weeks ago and diagnosed with diverticulosis.

## 2014-05-10 NOTE — Discharge Instructions (Signed)
Abdominal Pain °Many things can cause abdominal pain. Usually, abdominal pain is not caused by a disease and will improve without treatment. It can often be observed and treated at home. Your health care provider will do a physical exam and possibly order blood tests and X-rays to help determine the seriousness of your pain. However, in many cases, more time must pass before a clear cause of the pain can be found. Before that point, your health care provider may not know if you need more testing or further treatment. °HOME CARE INSTRUCTIONS  °Monitor your abdominal pain for any changes. The following actions may help to alleviate any discomfort you are experiencing: °· Only take over-the-counter or prescription medicines as directed by your health care provider. °· Do not take laxatives unless directed to do so by your health care provider. °· Try a clear liquid diet (broth, tea, or water) as directed by your health care provider. Slowly move to a bland diet as tolerated. °SEEK MEDICAL CARE IF: °· You have unexplained abdominal pain. °· You have abdominal pain associated with nausea or diarrhea. °· You have pain when you urinate or have a bowel movement. °· You experience abdominal pain that wakes you in the night. °· You have abdominal pain that is worsened or improved by eating food. °· You have abdominal pain that is worsened with eating fatty foods. °· You have a fever. °SEEK IMMEDIATE MEDICAL CARE IF:  °· Your pain does not go away within 2 hours. °· You keep throwing up (vomiting). °· Your pain is felt only in portions of the abdomen, such as the right side or the left lower portion of the abdomen. °· You pass bloody or black tarry stools. °MAKE SURE YOU: °· Understand these instructions.   °· Will watch your condition.   °· Will get help right away if you are not doing well or get worse.   °Document Released: 10/10/2004 Document Revised: 01/05/2013 Document Reviewed: 09/09/2012 °[employer]® Patient Information  ©2015 [employer]. This information is not intended to replace advice given to you by your health care provider. Make sure you discuss any questions you have with your health care provider. ° °Nausea and Vomiting °Nausea is a sick feeling that often comes before throwing up (vomiting). Vomiting is a reflex where stomach contents come out of your mouth. Vomiting can cause severe loss of body fluids (dehydration). Children and elderly adults can become dehydrated quickly, especially if they also have diarrhea. Nausea and vomiting are symptoms of a condition or disease. It is important to find the cause of your symptoms. °CAUSES  °· Direct irritation of the stomach lining. This irritation can result from increased acid production (gastroesophageal reflux disease), infection, food poisoning, taking certain medicines (such as nonsteroidal anti-inflammatory drugs), alcohol use, or tobacco use. °· Signals from the brain. These signals could be caused by a headache, heat exposure, an inner ear disturbance, increased pressure in the brain from injury, infection, a tumor, or a concussion, pain, emotional stimulus, or metabolic problems. °· An obstruction in the gastrointestinal tract (bowel obstruction). °· Illnesses such as diabetes, hepatitis, gallbladder problems, appendicitis, kidney problems, cancer, sepsis, atypical symptoms of a heart attack, or eating disorders. °· Medical treatments such as chemotherapy and radiation. °· Receiving medicine that makes you sleep (general anesthetic) during surgery. °DIAGNOSIS °Your caregiver may ask for tests to be done if the problems do not improve after a few days. Tests may also be done if symptoms are severe or if the reason for the nausea   and vomiting is not clear. Tests may include: °· Urine tests. °· Blood tests. °· Stool tests. °· Cultures (to look for evidence of infection). °· X-rays or other imaging studies. °Test results can help your caregiver make decisions about  treatment or the need for additional tests. °TREATMENT °You need to stay well hydrated. Drink frequently but in small amounts. You may wish to drink water, sports drinks, clear broth, or eat frozen ice pops or gelatin dessert to help stay hydrated. When you eat, eating slowly may help prevent nausea. There are also some antinausea medicines that may help prevent nausea. °HOME CARE INSTRUCTIONS  °· Take all medicine as directed by your caregiver. °· If you do not have an appetite, do not force yourself to eat. However, you must continue to drink fluids. °· If you have an appetite, eat a normal diet unless your caregiver tells you differently. °¨ Eat a variety of complex carbohydrates (rice, wheat, potatoes, bread), lean meats, yogurt, fruits, and vegetables. °¨ Avoid high-fat foods because they are more difficult to digest. °· Drink enough water and fluids to keep your urine clear or pale yellow. °· If you are dehydrated, ask your caregiver for specific rehydration instructions. Signs of dehydration may include: °¨ Severe thirst. °¨ Dry lips and mouth. °¨ Dizziness. °¨ Dark urine. °¨ Decreasing urine frequency and amount. °¨ Confusion. °¨ Rapid breathing or pulse. °SEEK IMMEDIATE MEDICAL CARE IF:  °· You have blood or brown flecks (like coffee grounds) in your vomit. °· You have black or bloody stools. °· You have a severe headache or stiff neck. °· You are confused. °· You have severe abdominal pain. °· You have chest pain or trouble breathing. °· You do not urinate at least once every 8 hours. °· You develop cold or clammy skin. °· You continue to vomit for longer than 24 to 48 hours. °· You have a fever. °MAKE SURE YOU:  °· Understand these instructions. °· Will watch your condition. °· Will get help right away if you are not doing well or get worse. °Document Released: 12/31/2004 Document Revised: 03/25/2011 Document Reviewed: 05/30/2010 °[employer]® Patient Information ©2015 [employer]. This information is not  intended to replace advice given to you by your health care provider. Make sure you discuss any questions you have with your health care provider. ° °

## 2014-05-15 ENCOUNTER — Other Ambulatory Visit: Payer: Self-pay | Admitting: Family Medicine

## 2014-05-15 ENCOUNTER — Other Ambulatory Visit (INDEPENDENT_AMBULATORY_CARE_PROVIDER_SITE_OTHER): Payer: Self-pay | Admitting: Internal Medicine

## 2014-05-17 ENCOUNTER — Encounter (INDEPENDENT_AMBULATORY_CARE_PROVIDER_SITE_OTHER): Payer: Self-pay | Admitting: Internal Medicine

## 2014-05-17 ENCOUNTER — Encounter (HOSPITAL_COMMUNITY): Payer: Self-pay

## 2014-05-17 ENCOUNTER — Ambulatory Visit (INDEPENDENT_AMBULATORY_CARE_PROVIDER_SITE_OTHER): Payer: Commercial Managed Care - HMO | Admitting: Internal Medicine

## 2014-05-17 ENCOUNTER — Emergency Department (HOSPITAL_COMMUNITY)
Admission: EM | Admit: 2014-05-17 | Discharge: 2014-05-17 | Disposition: A | Discharge status: Home / Self Care | Payer: Commercial Managed Care - HMO | Attending: Emergency Medicine | Admitting: Emergency Medicine

## 2014-05-17 ENCOUNTER — Emergency Department (HOSPITAL_COMMUNITY): Payer: Commercial Managed Care - HMO

## 2014-05-17 VITALS — BP 92/62 | HR 80 | Temp 97.0°F | Ht 67.0 in | Wt 158.0 lb

## 2014-05-17 DIAGNOSIS — R630 Anorexia: Secondary | ICD-10-CM | POA: Diagnosis not present

## 2014-05-17 DIAGNOSIS — I10 Essential (primary) hypertension: Secondary | ICD-10-CM | POA: Diagnosis not present

## 2014-05-17 DIAGNOSIS — K219 Gastro-esophageal reflux disease without esophagitis: Secondary | ICD-10-CM | POA: Insufficient documentation

## 2014-05-17 DIAGNOSIS — R634 Abnormal weight loss: Secondary | ICD-10-CM | POA: Diagnosis not present

## 2014-05-17 DIAGNOSIS — M1990 Unspecified osteoarthritis, unspecified site: Secondary | ICD-10-CM | POA: Diagnosis not present

## 2014-05-17 DIAGNOSIS — Z87438 Personal history of other diseases of male genital organs: Secondary | ICD-10-CM | POA: Diagnosis not present

## 2014-05-17 DIAGNOSIS — Z7982 Long term (current) use of aspirin: Secondary | ICD-10-CM | POA: Insufficient documentation

## 2014-05-17 DIAGNOSIS — J449 Chronic obstructive pulmonary disease, unspecified: Secondary | ICD-10-CM | POA: Diagnosis not present

## 2014-05-17 DIAGNOSIS — R1030 Lower abdominal pain, unspecified: Secondary | ICD-10-CM | POA: Diagnosis not present

## 2014-05-17 DIAGNOSIS — F419 Anxiety disorder, unspecified: Secondary | ICD-10-CM | POA: Insufficient documentation

## 2014-05-17 DIAGNOSIS — Z8619 Personal history of other infectious and parasitic diseases: Secondary | ICD-10-CM | POA: Insufficient documentation

## 2014-05-17 DIAGNOSIS — R109 Unspecified abdominal pain: Secondary | ICD-10-CM | POA: Diagnosis present

## 2014-05-17 DIAGNOSIS — I25119 Atherosclerotic heart disease of native coronary artery with unspecified angina pectoris: Secondary | ICD-10-CM | POA: Insufficient documentation

## 2014-05-17 DIAGNOSIS — M109 Gout, unspecified: Secondary | ICD-10-CM | POA: Insufficient documentation

## 2014-05-17 DIAGNOSIS — R5383 Other fatigue: Secondary | ICD-10-CM | POA: Insufficient documentation

## 2014-05-17 DIAGNOSIS — R112 Nausea with vomiting, unspecified: Secondary | ICD-10-CM | POA: Insufficient documentation

## 2014-05-17 DIAGNOSIS — Z87891 Personal history of nicotine dependence: Secondary | ICD-10-CM | POA: Diagnosis not present

## 2014-05-17 DIAGNOSIS — F0390 Unspecified dementia without behavioral disturbance: Secondary | ICD-10-CM | POA: Diagnosis not present

## 2014-05-17 DIAGNOSIS — Z79899 Other long term (current) drug therapy: Secondary | ICD-10-CM | POA: Diagnosis not present

## 2014-05-17 LAB — CBC WITH DIFFERENTIAL/PLATELET
BASOS PCT: 1 % (ref 0–1)
Basophils Absolute: 0 10*3/uL (ref 0.0–0.1)
EOS ABS: 0.2 10*3/uL (ref 0.0–0.7)
EOS PCT: 2 % (ref 0–5)
HCT: 39.6 % (ref 39.0–52.0)
HEMOGLOBIN: 12.4 g/dL — AB (ref 13.0–17.0)
LYMPHS ABS: 1.5 10*3/uL (ref 0.7–4.0)
Lymphocytes Relative: 22 % (ref 12–46)
MCH: 28.4 pg (ref 26.0–34.0)
MCHC: 31.3 g/dL (ref 30.0–36.0)
MCV: 90.6 fL (ref 78.0–100.0)
MONO ABS: 0.6 10*3/uL (ref 0.1–1.0)
MONOS PCT: 8 % (ref 3–12)
Neutro Abs: 4.7 10*3/uL (ref 1.7–7.7)
Neutrophils Relative %: 67 % (ref 43–77)
Platelets: 253 10*3/uL (ref 150–400)
RBC: 4.37 MIL/uL (ref 4.22–5.81)
RDW: 15.4 % (ref 11.5–15.5)
WBC: 6.9 10*3/uL (ref 4.0–10.5)

## 2014-05-17 LAB — COMPREHENSIVE METABOLIC PANEL
ALT: 16 U/L — ABNORMAL LOW (ref 17–63)
ANION GAP: 10 (ref 5–15)
AST: 21 U/L (ref 15–41)
Albumin: 4 g/dL (ref 3.5–5.0)
Alkaline Phosphatase: 89 U/L (ref 38–126)
BUN: 29 mg/dL — ABNORMAL HIGH (ref 6–20)
CALCIUM: 9.6 mg/dL (ref 8.9–10.3)
CHLORIDE: 110 mmol/L (ref 101–111)
CO2: 24 mmol/L (ref 22–32)
CREATININE: 1.66 mg/dL — AB (ref 0.61–1.24)
GFR calc Af Amer: 43 mL/min — ABNORMAL LOW (ref >60)
GFR, EST NON AFRICAN AMERICAN: 37 mL/min — AB (ref >60)
Glucose, Bld: 113 mg/dL — ABNORMAL HIGH (ref 70–99)
Potassium: 4.2 mmol/L (ref 3.5–5.1)
Sodium: 144 mmol/L (ref 135–145)
Total Bilirubin: 1.1 mg/dL (ref 0.3–1.2)
Total Protein: 7.6 g/dL (ref 6.5–8.1)

## 2014-05-17 LAB — LIPASE, BLOOD: Lipase: 19 U/L — ABNORMAL LOW (ref 22–51)

## 2014-05-17 LAB — TROPONIN I: TROPONIN I: 0.1 ng/mL — AB (ref <0.031)

## 2014-05-17 LAB — LACTIC ACID, PLASMA: LACTIC ACID, VENOUS: 1.9 mmol/L (ref 0.5–2.0)

## 2014-05-17 MED ORDER — HYDROMORPHONE HCL 1 MG/ML IJ SOLN
0.5000 mg | Freq: Once | INTRAMUSCULAR | Status: AC
  Administered 2014-05-17: 0.5 mg via INTRAVENOUS
  Filled 2014-05-17: qty 1

## 2014-05-17 MED ORDER — HYDROCODONE-ACETAMINOPHEN 5-325 MG PO TABS: 2.0000 | ORAL_TABLET | ORAL | Status: DC | PRN

## 2014-05-17 MED ORDER — SODIUM CHLORIDE 0.9 % IV BOLUS (SEPSIS)
500.0000 mL | Freq: Once | INTRAVENOUS | Status: AC
  Administered 2014-05-17: 500 mL via INTRAVENOUS

## 2014-05-17 MED ORDER — IOHEXOL 350 MG/ML SOLN
80.0000 mL | Freq: Once | INTRAVENOUS | Status: AC | PRN
  Administered 2014-05-17: 80 mL via INTRAVENOUS

## 2014-05-17 MED ORDER — HYDROCODONE-ACETAMINOPHEN 5-325 MG PO TABS: 1.0000 | ORAL_TABLET | ORAL | Status: DC | PRN

## 2014-05-17 NOTE — ED Notes (Signed)
Pt c/o abd pain for " a while."   Reports for the past few months, pt's pain has been more severe.  Family says pt was sent here by Dr. Laural Golden for CT scan of arteries in abd.  Wife says pt told her today that he felt like "blowing his head off."  Pt states, " the way I've been hurting, I can't live like that."  Family says pt can't tolerate anything touching his stomach and just moans and yells at home.  Reports has lost 17lb since January.  Reports n/v/d.  LBM was today and was diarrhea.

## 2014-05-17 NOTE — ED Notes (Signed)
No distress noted.

## 2014-05-17 NOTE — ED Notes (Signed)
[name] Macias called from Dr. Gentry Fitz office recommending pt have a CT angio of the abdomen and pelvis with contrast

## 2014-05-17 NOTE — Patient Instructions (Signed)
To The ED

## 2014-05-17 NOTE — ED Notes (Signed)
Patient was given a pre-package of hydrocodone-acetaminophen quantity six and given instructions on use, patient verbally understands.

## 2014-05-17 NOTE — Progress Notes (Addendum)
Subjective:    Patient ID: [name] Macias, male    DOB: 05/18/1932, 81 y.o.   MRN: [id number]  HPIHere today for f/u after recent visit to ED x 2 for abdominal pain. Descrbes as a crampy, sharp type pain. He has had this pain for about 2 years. The pain has progressively worsened. His appetite is not good. He has lost 18 pounds since January. The pain is sometimes related to eating and sometimes not.  Rates pain 10/10 now.  The pain is random.  No black stools.  BMs are normal. He occasionally has constipation.  He usually has a BM daily. No melena or BRRB. He points to his lower abdomen when I ask where his pain.  No fever. No nausea or vomiting. Patient has significant cardiac hx.  09/06/2009 Echo: EF 15-20% with hx of chronic CHF Hx of ICD implant.  CBC    Component Value Date/Time   WBC 7.5 05/10/2014 1539   RBC 4.53 05/10/2014 1539   HGB 13.0 05/10/2014 1539   HCT 41.0 05/10/2014 1539   PLT 253 05/10/2014 1539   MCV 90.5 05/10/2014 1539   MCH 28.7 05/10/2014 1539   MCHC 31.7 05/10/2014 1539   RDW 15.1 05/10/2014 1539   LYMPHSABS 1.6 05/10/2014 1539   MONOABS 0.6 05/10/2014 1539   EOSABS 0.0 05/10/2014 1539   BASOSABS 0.0 05/10/2014 1539   Hepatic Function Panel     Component Value Date/Time   PROT 7.8 05/10/2014 1539   ALBUMIN 4.1 05/10/2014 1539   AST 22 05/10/2014 1539   ALT 14 05/10/2014 1539   ALKPHOS 89 05/10/2014 1539   BILITOT 1.4* 05/10/2014 1539   BILIDIR 0.2 05/10/2014 1539   IBILI 1.2* 05/10/2014 1539        07/01/2013 EGD/ED: Indications: Patient is 81 year old African American male with intermittent epigastric pain and negative CT. He feels better since he's been on omeprazole. Patient also has experienced intermittent solid food dysphagia. He denies nausea vomiting melena or rectal bleeding. Impression: Small sliding heart hernia otherwise normal EGD. Esophagus dilated by passing 54 French Maloney dilator but no mucosal destruction noted. He  possibly has esophageal motility disorder.  05/10/2014 CT abdomen/pelvis with CM; Adominal pain: IMPRESSION: Cardiomegaly without evidence of basilar edema  Trace pleural effusions with bibasilar compressive atelectasis  Stable renal cysts and hypo perfusion of the small right kidney lower pole suspect related to renal vascular disease and atherosclerosis.  Extensive colonic diverticulosis without acute inflammation  Stable small umbilical hernia containing a loop of small bowel without incarceration or obstruction.  Stable fat containing left inguinal hernia Review of Systems Past Medical History  Diagnosis Date  . Arthritis   . Gout   . Non-ischemic cardiomyopathy   . Hypertension   . Arthritis     left foot   . CAD (coronary artery disease) of artery bypass graft   . COPD (chronic obstructive pulmonary disease)   . History of syphilis   . Type 2 HSV infection of penis   . Erectile dysfunction   . Anxiety disorder   . Dementia   . Anxiety   . Angina   . GERD (gastroesophageal reflux disease)   . Ascending aortic aneurysm   . Lung nodule     RUL nodule noted on abdominal CT    Past Surgical History  Procedure Laterality Date  . Left ankle      Not Done by Dr. Aline Brochure   . S/p  biventricular pacemaker / aicd implantation  02/19/05  .  Defribrillator implant    . Crtd upgrade  12/14/2009  . Transthoracic echocardiogram  2011  . Doppler echocardiography  2009  . Insert / replace / remove pacemaker    . Esophagogastroduodenoscopy N/A 07/01/2013    Procedure: ESOPHAGOGASTRODUODENOSCOPY (EGD);  Surgeon: Rogene Houston, MD;  Location: [facility];  Service: Endoscopy;  Laterality: N/A;  145-moved to 1245 Ann notfied pt    Allergies  Allergen Reactions  . Codeine Other (See Comments)    Whelps up  . Enalapril Maleate Other (See Comments)    Whelps up  . Sulfonamide Derivatives Other (See Comments)    Whelps up    Current Outpatient Prescriptions on File  Prior to Visit  Medication Sig Dispense Refill  . acetaminophen (TYLENOL) 500 MG tablet Take 1,000 mg by mouth daily as needed for moderate pain.     Marland Kitchen allopurinol (ZYLOPRIM) 100 MG tablet Take 1 tablet (100 mg total) by mouth daily. 90 tablet 1  . Ascorbic Acid (VITAMIN C PO) Take 1 tablet by mouth daily.    Marland Kitchen aspirin (ASPIRIN LOW DOSE) 81 MG EC tablet Take 81 mg by mouth daily.      . carvedilol (COREG) 25 MG tablet Take 1 tablet (25 mg total) by mouth 2 (two) times daily with a meal. 180 tablet 1  . Cholecalciferol (VITAMIN D PO) Take 2 tablets by mouth daily.    . furosemide (LASIX) 40 MG tablet Take 1 tablet (40 mg total) by mouth 2 (two) times daily. 180 tablet 1  . losartan (COZAAR) 25 MG tablet Take 1 tablet (25 mg total) by mouth daily. 90 tablet 1  . mirtazapine (REMERON) 15 MG tablet TAKE 1 TABLET BY MOUTH AT BEDTIME 30 tablet 0  . nitroGLYCERIN (NITROSTAT) 0.4 MG SL tablet Place 1 tablet (0.4 mg total) under the tongue every 5 (five) minutes as needed for chest pain. For chest pain 30 tablet 6  . NON FORMULARY 2 mLs. Oxygen. Daily at night and PRN during the day.     Marland Kitchen omeprazole (PRILOSEC) 20 MG capsule Take 1 capsule (20 mg total) by mouth daily. 30 capsule 3  . omeprazole (PRILOSEC) 40 MG capsule TAKE 1 CAPSULE (40 MG TOTAL) BY MOUTH DAILY. 30 capsule 3  . ondansetron (ZOFRAN ODT) 4 MG disintegrating tablet Take 1 tablet (4 mg total) by mouth every 8 (eight) hours as needed for nausea or vomiting. 20 tablet 0  . sertraline (ZOLOFT) 50 MG tablet Take 50 mg by mouth daily.  5  . spironolactone (ALDACTONE) 25 MG tablet Take 1 tablet (25 mg total) by mouth daily. 90 tablet 1  . temazepam (RESTORIL) 30 MG capsule Take 1 capsule (30 mg total) by mouth at bedtime as needed for sleep. 90 capsule 1  . oxyCODONE-acetaminophen (PERCOCET/ROXICET) 5-325 MG per tablet Take 1 tablet by mouth every 6 (six) hours as needed. (Patient not taking: Reported on 05/17/2014) 15 tablet 0  .  oxyCODONE-acetaminophen (PERCOCET/ROXICET) 5-325 MG per tablet Take 1 tablet by mouth every 6 (six) hours as needed. (Patient not taking: Reported on 05/17/2014) 6 tablet 0  . promethazine (PHENERGAN) 25 MG tablet Take 1 tablet (25 mg total) by mouth every 6 (six) hours as needed. (Patient not taking: Reported on 05/17/2014) 15 tablet 0   No current facility-administered medications on file prior to visit.        Objective:   Physical ExamBlood pressure 92/62, pulse 80, temperature 97 F (36.1 C), height 5\' 7"  (1.702 m), weight 158  lb (71.668 kg).  Alert and oriented. Skin warm and dry. Oral mucosa is moist.   . Sclera anicteric, conjunctivae is pink. Thyroid not enlarged. No cervical lymphadenopathy. Lungs clear. Heart regular rate and rhythm.  Abdomen is soft. Bowel sounds are positive. No hepatomegaly. No abdominal masses felt. Lower abdominal pain below the umbliicus which he rates 10/10..    Dr. Laural Golden in with patient.        Assessment & Plan:  Lower abdominal pain.  Dr. Laural Golden in with patient.  ? Ischemic process in abdomen. . Patient and family advised to go to the ED. Patient needs CT angio abdomen/pelvis with CM. I talked with Alinda Money. Advised that patient needs a CT angio abdomen/pelvis with CM. Dr.Hawkins is to be called if admitted.   I

## 2014-05-17 NOTE — ED Provider Notes (Signed)
CSN: [id number]     Arrival date & time 05/17/14  64 History   First MD Initiated Contact with Patient 05/17/14 1654     Chief Complaint  Patient presents with  . Abdominal Pain     (Consider location/radiation/quality/duration/timing/severity/associated sxs/prior Treatment) HPI Comments: 81 year old male with history of chronic systolic heart failure, defibrillator, high blood pressure, inguinal hernia presents with recurrent worsening central abdominal pain nonradiating. Patient has had this from [place] 1 year, recently has lost 17 pounds and pain is gained to the point where it's difficult to live as he has difficulty functioning with the pain. Mild improvement of pain with Percocets. Patient saw gastroenterology today who sent patient over further evaluation. Patient has had multiple CT scans workups however denies having an EGD to look for ulcer or angiogram to look for vascular disease as the cause. Nothing has improved the pain significantly. No urinary symptoms. No blood in the stools. Patient is a past smoker.  Patient is a 81 y.o. male presenting with abdominal pain. The history is provided by the patient and a relative.  Abdominal Pain Associated symptoms: fatigue, nausea and vomiting   Associated symptoms: no chest pain, no chills, no dysuria, no fever and no shortness of breath     Past Medical History  Diagnosis Date  . Arthritis   . Gout   . Non-ischemic cardiomyopathy   . Hypertension   . Arthritis     left foot   . CAD (coronary artery disease) of artery bypass graft   . COPD (chronic obstructive pulmonary disease)   . History of syphilis   . Type 2 HSV infection of penis   . Erectile dysfunction   . Anxiety disorder   . Dementia   . Anxiety   . Angina   . GERD (gastroesophageal reflux disease)   . Ascending aortic aneurysm   . Lung nodule     RUL nodule noted on abdominal CT   Past Surgical History  Procedure Laterality Date  . Left ankle      Not Done by Dr.  Aline Brochure   . S/p  biventricular pacemaker / aicd implantation  02/19/05  . Defribrillator implant    . Crtd upgrade  12/14/2009  . Transthoracic echocardiogram  2011  . Doppler echocardiography  2009  . Insert / replace / remove pacemaker    . Esophagogastroduodenoscopy N/A 07/01/2013    Procedure: ESOPHAGOGASTRODUODENOSCOPY (EGD);  Surgeon: Rogene Houston, MD;  Location: [facility];  Service: Endoscopy;  Laterality: N/A;  145-moved to 1245 Ann notfied pt   Family History  Problem Relation Age of Onset  . Heart failure Father   . Prostate cancer Brother   . Diabetes Brother   . Hypertension Brother   . Heart failure Brother    History  Substance Use Topics  . Smoking status: Former Smoker -- 1.00 packs/day for 10 years    Types: Cigarettes    Quit date: 01/15/1963  . Smokeless tobacco: Never Used  . Alcohol Use: No    Review of Systems  Constitutional: Positive for appetite change, fatigue and unexpected weight change. Negative for fever and chills.  HENT: Negative for congestion.   Eyes: Negative for visual disturbance.  Respiratory: Negative for shortness of breath.   Cardiovascular: Negative for chest pain.  Gastrointestinal: Positive for nausea, vomiting and abdominal pain.  Genitourinary: Negative for dysuria and flank pain.  Musculoskeletal: Negative for back pain, neck pain and neck stiffness.  Skin: Negative for rash.  Neurological:  Negative for light-headedness and headaches.      Allergies  Codeine; Enalapril maleate; and Sulfonamide derivatives  Home Medications   Prior to Admission medications   Medication Sig Start Date End Date Taking? Authorizing Provider  acetaminophen (TYLENOL) 500 MG tablet Take 1,000 mg by mouth daily as needed for moderate pain.    Yes Historical Provider, MD  allopurinol (ZYLOPRIM) 100 MG tablet Take 1 tablet (100 mg total) by mouth daily. 11/03/13  Yes Fayrene Helper, MD  Ascorbic Acid (VITAMIN C PO) Take 1 tablet by mouth  daily.   Yes Historical Provider, MD  aspirin (ASPIRIN LOW DOSE) 81 MG EC tablet Take 81 mg by mouth daily.     Yes Historical Provider, MD  carvedilol (COREG) 25 MG tablet Take 1 tablet (25 mg total) by mouth 2 (two) times daily with a meal. 11/03/13  Yes Fayrene Helper, MD  Cholecalciferol (VITAMIN D PO) Take 2 tablets by mouth daily.   Yes Historical Provider, MD  furosemide (LASIX) 40 MG tablet Take 1 tablet (40 mg total) by mouth 2 (two) times daily. 11/03/13  Yes Fayrene Helper, MD  losartan (COZAAR) 25 MG tablet Take 1 tablet (25 mg total) by mouth daily. 11/03/13  Yes Fayrene Helper, MD  mirtazapine (REMERON) 15 MG tablet TAKE 1 TABLET BY MOUTH AT BEDTIME 05/10/14  Yes Fayrene Helper, MD  nitroGLYCERIN (NITROSTAT) 0.4 MG SL tablet Place 1 tablet (0.4 mg total) under the tongue every 5 (five) minutes as needed for chest pain. For chest pain 03/18/14  Yes Evans Lance, MD  omeprazole (PRILOSEC) 40 MG capsule TAKE 1 CAPSULE (40 MG TOTAL) BY MOUTH DAILY. 05/16/14  Yes Butch Penny, NP  ondansetron (ZOFRAN ODT) 4 MG disintegrating tablet Take 1 tablet (4 mg total) by mouth every 8 (eight) hours as needed for nausea or vomiting. 05/10/14  Yes Kristen N Ward, DO  sertraline (ZOLOFT) 50 MG tablet Take 50 mg by mouth daily. 04/11/14  Yes Historical Provider, MD  spironolactone (ALDACTONE) 25 MG tablet Take 1 tablet (25 mg total) by mouth daily. 11/03/13  Yes Fayrene Helper, MD  temazepam (RESTORIL) 30 MG capsule Take 1 capsule (30 mg total) by mouth at bedtime as needed for sleep. 11/29/13 05/30/14 Yes Fayrene Helper, MD  HYDROcodone-acetaminophen (NORCO) 5-325 MG per tablet Take 1-2 tablets by mouth every 4 (four) hours as needed. 05/17/14   Elnora Morrison, MD  HYDROcodone-acetaminophen (NORCO) 5-325 MG per tablet Take 2 tablets by mouth every 4 (four) hours as needed. 05/17/14   Elnora Morrison, MD  NON FORMULARY 2 mLs. Oxygen. Daily at night and PRN during the day.     Historical  Provider, MD  omeprazole (PRILOSEC) 20 MG capsule Take 1 capsule (20 mg total) by mouth daily. Patient not taking: Reported on 05/17/2014 10/11/13   Butch Penny, NP  oxyCODONE-acetaminophen (PERCOCET/ROXICET) 5-325 MG per tablet Take 1 tablet by mouth every 6 (six) hours as needed. Patient not taking: Reported on 05/17/2014 05/10/14   Delice Bison Ward, DO  oxyCODONE-acetaminophen (PERCOCET/ROXICET) 5-325 MG per tablet Take 1 tablet by mouth every 6 (six) hours as needed. Patient not taking: Reported on 05/17/2014 05/10/14   Delice Bison Ward, DO  promethazine (PHENERGAN) 25 MG tablet Take 1 tablet (25 mg total) by mouth every 6 (six) hours as needed. Patient not taking: Reported on 05/17/2014 04/22/14   [name], MD   BP 120/90 mmHg  Pulse 79  Temp(Src) 97.5 F (36.4  C) (Oral)  Resp 20  Ht 5\' 7"  (1.702 m)  Wt 164 lb (74.39 kg)  BMI 25.68 kg/m2  SpO2 93% Physical Exam  Constitutional: He is oriented to person, place, and time. He appears well-developed. No distress.  HENT:  Head: Normocephalic and atraumatic.  Dry mucous membranes  Eyes: Conjunctivae are normal. Right eye exhibits no discharge. Left eye exhibits no discharge.  Neck: Normal range of motion. Neck supple. No tracheal deviation present.  Cardiovascular: Normal rate and regular rhythm.   Pulmonary/Chest: Effort normal and breath sounds normal.  Abdominal: Soft. He exhibits no distension. There is tenderness (moderate tenderness central abdomen, no peritonitis.). There is no guarding.  Musculoskeletal: He exhibits no edema.  Neurological: He is alert and oriented to person, place, and time.  Skin: Skin is warm. No rash noted.  Psychiatric: He has a normal mood and affect.  Nursing note and vitals reviewed.   ED Course  Procedures (including critical care time) Labs Review Labs Reviewed  CBC WITH DIFFERENTIAL/PLATELET - Abnormal; Notable for the following:    Hemoglobin 12.4 (*)    All other components within normal limits   COMPREHENSIVE METABOLIC PANEL - Abnormal; Notable for the following:    Glucose, Bld 113 (*)    BUN 29 (*)    Creatinine, Ser 1.66 (*)    ALT 16 (*)    GFR calc non Af Amer 37 (*)    GFR calc Af Amer 43 (*)    All other components within normal limits  LIPASE, BLOOD - Abnormal; Notable for the following:    Lipase 19 (*)    All other components within normal limits  TROPONIN I - Abnormal; Notable for the following:    Troponin I 0.10 (*)    All other components within normal limits  LACTIC ACID, PLASMA    Imaging Review Ct Cta Abd/pel W/cm &/or W/o Cm  05/17/2014   CLINICAL DATA:  81 year old male with progressive chronic abdominal pain and a 17 lb weight loss over the past 4 months.  EXAM: CTA ABDOMEN AND PELVIS wITHOUT AND WITH CONTRAST  TECHNIQUE: Multidetector CT imaging of the abdomen and pelvis was performed using the standard protocol during bolus administration of intravenous contrast. Multiplanar reconstructed images and MIPs were obtained and reviewed to evaluate the vascular anatomy.  CONTRAST:  22mL OMNIPAQUE IOHEXOL 350 MG/ML SOLN  COMPARISON:  Prior CT scan of the abdomen and pelvis 05/10/2014  FINDINGS: VASCULAR  Aorta: Normal caliber aorta with scattered atherosclerotic plaque. No aneurysmal dilatation, dissection or penetrating ulcer.  Celiac: Heterogeneous atherosclerotic plaque at the origin of the celiac axis results in moderate stenosis. Overall, the vessel was fairly atretic. Conventional hepatic arterial anatomy. Small caliber splenic artery.  SMA: Mild atherosclerotic plaque at the origin with perhaps minimal narrowing. The vessels widely patent.  Renals: Single dominant renal arteries bilaterally. Calcified atherosclerotic plaque results in a moderate narrowing of the origin of the right renal artery. There is bulbous ectasia at the origin of the left renal artery measuring up to 1.2 cm in diameter. The distal vessel remains patent. No changes of fibromuscular dysplasia.   IMA: Patent with mild to moderate stenosis at the origin of the inferior mesenteric artery. The vessel remains patent.  Inflow: Scattered atherosclerotic vascular calcifications without evidence of significant stenosis. Both internal iliac arteries remain patent.  Proximal Outflow: Minimal atherosclerotic disease without evidence of stenosis.  Veins: No focal venous abnormality identified.  NON-VASCULAR  Lower Chest: Slightly decreased right pleural effusion with  associated right lower lobe atelectasis. Stable cardiomegaly with marked left ventricular dilatation. Incompletely imaged pacemaker leads.  Abdomen: Unremarkable CT appearance of the stomach, duodenum, spleen, adrenal glands and pancreas. Normal hepatic contour and morphology. Gallbladder is unremarkable. No intra or extrahepatic biliary ductal dilatation.  Unremarkable appearance of the bilateral kidneys. No focal solid lesion, hydronephrosis or nephrolithiasis. Circumscribed low-attenuation lesions consistent with cysts in the interpolar left kidney are unchanged compared to prior. Renal size discrepancy with smaller right kidney. Additionally, there is cortical thinning and hypoperfusion of the anterior aspect of the lower pole which is similar compared to prior imaging in likely represents stenosis or occlusion of the inferior lobar artery.  Extensive colonic diverticulosis without evidence of active diverticulitis. Normal appendix in the right lower quadrant. No focal bowel wall thickening, obstruction, free fluid or suspicious adenopathy. Small umbilical hernia containing a knuckle of small bowel without evidence of obstruction or inflammation.  Pelvis: Left omental fat containing indirect inguinal hernia. Mild prostatomegaly. Unremarkable bladder.  Bones/Soft Tissues: Multilevel degenerative disc disease. No acute fracture or aggressive appearing lytic or blastic osseous lesion.  Review of the MIP images confirms the above findings.  IMPRESSION:  VASCULAR  1. Mild-moderate splanchnic arterial disease. There may be mild-moderate stenosis of the relatively atretic celiac artery, and likely mild narrowing of the inferior mesenteric artery. However, the superior mesenteric artery remains widely patent with only minimal disease. Overall, the severity of arterial disease is unlikely to result in chronic mesenteric ischemia. However, mesenteric angiography and pressure measurements could further evaluate for clinically significant stenosis. 2. At least moderate stenosis of the origin of the right renal artery with associated right renal atrophy consistent with chronic ischemia. 3. Ectasia of the origin of the left renal artery without interval change. NON VASCULAR  1. No evidence of a bowel obstruction, colitis or enteritis. 2. Extensive colonic diverticulosis without evidence of active diverticulitis. 3. Stable cardiomegaly with marked left ventricular dilatation. 4. Decreasing right pleural effusion. 5. Stable right renal size discrepancy with atrophy and hypoperfusion of the anterior right lower pole. 6. Stable small umbilical hernia containing a knuckle of small bowel without evidence of obstruction or inflammation. 7. Fat containing left indirect inguinal hernia. 8. Prostatomegaly.  Signed,  Criselda Peaches, MD  [facility]  [facility]   Electronically Signed   By: Jacqulynn Cadet M.D.   On: 05/17/2014 19:24     EKG Interpretation None      MDM   Final diagnoses:  Abdominal pain, recurrent  Weight loss  CRF  Patient presents with worsening abdominal pain and weight loss, with vascular disease and age history concern for chronic bowel ischemia. CT angiogram ordered, fluids and pain meds ordered.  Patient's pain resolved on recheck. CT anginal results reviewed multiple findings however nothing acute. No diverticulitis, no acute ischemia. The radiologist feels mild vascular disease does not  explain symptoms and not significant. With patient having this pain for a year, no acute ischemia improved in the ER discussed observation versus close outpatient follow up with gastroenterology and vascular surgery. Patient prefers outpatient follow-up. Troponin similar to previous, no cp or sob.    Results and differential diagnosis were discussed with the patient/parent/guardian. Close follow up outpatient was discussed, comfortable with the plan.   Medications  HYDROmorphone (DILAUDID) injection 0.5 mg (0.5 mg Intravenous Given 05/17/14 1721)  sodium chloride 0.9 % bolus 500 mL (0 mLs Intravenous Stopped 05/17/14 1750)  iohexol (OMNIPAQUE) 350 MG/ML injection 80 mL (80 mLs  Intravenous Contrast Given 05/17/14 1831)    Filed Vitals:   05/17/14 1930 05/17/14 2000 05/17/14 2004 05/17/14 2030  BP: 119/71 128/85 128/85 120/90  Pulse: 74 78 73 79  Temp:      TempSrc:      Resp:   20   Height:      Weight:      SpO2: 96% 89% 98% 93%    Final diagnoses:  Abdominal pain, recurrent  Weight loss        Elnora Morrison, MD 05/17/14 2051

## 2014-05-17 NOTE — Discharge Instructions (Signed)
Follow-up closely with your gastroenterologist and vascular surgery.  If you were given medicines take as directed.  If you are on coumadin or contraceptives realize their levels and effectiveness is altered by many different medicines.  If you have any reaction (rash, tongues swelling, other) to the medicines stop taking and see a physician.   Please follow up as directed and return to the ER or see a physician for new or worsening symptoms.  Thank you. Filed Vitals:   05/17/14 1815 05/17/14 1848 05/17/14 1900 05/17/14 2004  BP:  123/86 112/71 128/85  Pulse: 76 86 81 73  Temp:      TempSrc:      Resp:    20  Height:      Weight:      SpO2: 95% 93% 94% 98%    Abdominal Pain Many things can cause abdominal pain. Usually, abdominal pain is not caused by a disease and will improve without treatment. It can often be observed and treated at home. Your health care provider will do a physical exam and possibly order blood tests and X-rays to help determine the seriousness of your pain. However, in many cases, more time must pass before a clear cause of the pain can be found. Before that point, your health care provider may not know if you need more testing or further treatment. HOME CARE INSTRUCTIONS  Monitor your abdominal pain for any changes. The following actions may help to alleviate any discomfort you are experiencing:  Only take over-the-counter or prescription medicines as directed by your health care provider.  Do not take laxatives unless directed to do so by your health care provider.  Try a clear liquid diet (broth, tea, or water) as directed by your health care provider. Slowly move to a bland diet as tolerated. SEEK MEDICAL CARE IF:  You have unexplained abdominal pain.  You have abdominal pain associated with nausea or diarrhea.  You have pain when you urinate or have a bowel movement.  You experience abdominal pain that wakes you in the night.  You have abdominal pain  that is worsened or improved by eating food.  You have abdominal pain that is worsened with eating fatty foods.  You have a fever. SEEK IMMEDIATE MEDICAL CARE IF:   Your pain does not go away within 2 hours.  You keep throwing up (vomiting).  Your pain is felt only in portions of the abdomen, such as the right side or the left lower portion of the abdomen.  You pass bloody or black tarry stools. MAKE SURE YOU:  Understand these instructions.   Will watch your condition.   Will get help right away if you are not doing well or get worse.  Document Released: 10/10/2004 Document Revised: 01/05/2013 Document Reviewed: 09/09/2012 [facility] Patient Information 2015 [place], [place]. This information is not intended to replace advice given to you by your health care provider. Make sure you discuss any questions you have with your health care provider.

## 2014-05-23 ENCOUNTER — Other Ambulatory Visit: Payer: Self-pay | Admitting: *Deleted

## 2014-05-24 MED FILL — Hydrocodone-Acetaminophen Tab 5-325 MG: ORAL | Qty: 6 | Status: AC

## 2014-05-24 MED FILL — Oxycodone w/ Acetaminophen Tab 5-325 MG: ORAL | Qty: 6 | Status: AC

## 2014-05-26 ENCOUNTER — Ambulatory Visit: Payer: Commercial Managed Care - HMO | Admitting: *Deleted

## 2014-05-26 ENCOUNTER — Other Ambulatory Visit: Payer: Self-pay | Admitting: *Deleted

## 2014-05-26 NOTE — Patient Outreach (Signed)
Call received from [facility], requesting canceling appointment for today due to funeral. Requests RNCM call again next week to reschedule as they have other scheduled appointments. Plan: call next week to reschedule. [name] Macias. Laymond Purser, RN, BSN, [place] [phone]

## 2014-05-30 ENCOUNTER — Encounter (INDEPENDENT_AMBULATORY_CARE_PROVIDER_SITE_OTHER): Payer: Self-pay | Admitting: Internal Medicine

## 2014-05-30 ENCOUNTER — Ambulatory Visit (INDEPENDENT_AMBULATORY_CARE_PROVIDER_SITE_OTHER): Payer: Commercial Managed Care - HMO | Admitting: Internal Medicine

## 2014-05-30 VITALS — BP 90/46 | HR 60 | Temp 97.5°F | Ht 68.0 in | Wt 150.3 lb

## 2014-05-30 DIAGNOSIS — R109 Unspecified abdominal pain: Secondary | ICD-10-CM | POA: Diagnosis not present

## 2014-05-30 NOTE — Patient Instructions (Addendum)
OV in 3 months. Continue the Isosorbide BID. Percocet as needed

## 2014-05-30 NOTE — Progress Notes (Addendum)
Subjective:    Patient ID: [name] Macias, male    DOB: [birth year], 81 y.o.   MRN: [id number]  HPI Here today for f/u. Seen in our office 05/17/2014 for abdominal pain. At time of OV in rated pain 10/10.  He was having lower abdominal pain.  He has had pain x 2 yrs.  Referred to the ED for evaluation. He underwent a CT angio of the abdomen with CM. Started Isosorbide 5mg  BID since 05/25/2014.  He says he is doing better. He feels 60% better.  Rates pain at a 7/10 today. Has not had any pain medication today. Appetite is good. No weight loss.  He has gained 2 pounds since his last.  Acid reflux is controlled with Omeprazole. He takes the Percocet once a day.    Patient has significant cardiac hx.  09/06/2009 Echo: EF 15-20% with hx of chronic CHF Hx of ICD implant.    CBC    Component Value Date/Time   WBC 6.9 05/17/2014 1722   RBC 4.37 05/17/2014 1722   HGB 12.4* 05/17/2014 1722   HCT 39.6 05/17/2014 1722   PLT 253 05/17/2014 1722   MCV 90.6 05/17/2014 1722   MCH 28.4 05/17/2014 1722   MCHC 31.3 05/17/2014 1722   RDW 15.4 05/17/2014 1722   LYMPHSABS 1.5 05/17/2014 1722   MONOABS 0.6 05/17/2014 1722   EOSABS 0.2 05/17/2014 1722   BASOSABS 0.0 05/17/2014 1722   CMP     Component Value Date/Time   NA 144 05/17/2014 1722   K 4.2 05/17/2014 1722   CL 110 05/17/2014 1722   CO2 24 05/17/2014 1722   GLUCOSE 113* 05/17/2014 1722   BUN 29* 05/17/2014 1722   CREATININE 1.66* 05/17/2014 1722   CREATININE 1.64* 11/30/2013 0951   CALCIUM 9.6 05/17/2014 1722   PROT 7.6 05/17/2014 1722   ALBUMIN 4.0 05/17/2014 1722   AST 21 05/17/2014 1722   ALT 16* 05/17/2014 1722   ALKPHOS 89 05/17/2014 1722   BILITOT 1.1 05/17/2014 1722   GFRNONAA 37* 05/17/2014 1722   GFRNONAA 39* 11/30/2013 0951   GFRAA 43* 05/17/2014 1722   GFRAA 45* 11/30/2013 0951      05/17/2014 CT angio of the abdomen:  IMPRESSION: VASCULAR   1. Mild-moderate splanchnic arterial disease. There may  be mild-moderate stenosis of the relatively atretic celiac artery, and likely mild narrowing of the inferior mesenteric artery. However, the superior mesenteric artery remains widely patent with only minimal disease. Overall, the severity of arterial disease is unlikely to result in chronic mesenteric ischemia. However, mesenteric angiography and pressure measurements could further evaluate for clinically significant stenosis. 2. At least moderate stenosis of the origin of the right renal artery with associated right renal atrophy consistent with chronic ischemia. 3. Ectasia of the origin of the left renal artery without interval change. NON VASCULAR  1. No evidence of a bowel obstruction, colitis or enteritis. 2. Extensive colonic diverticulosis without evidence of active diverticulitis. 3. Stable cardiomegaly with marked left ventricular dilatation. 4. Decreasing right pleural effusion. 5. Stable right renal size discrepancy with atrophy and hypoperfusion of the anterior right lower pole. 6. Stable small umbilical hernia containing a knuckle of small bowel without evidence of obstruction or inflammation. 7. Fat containing left indirect inguinal hernia. 8. Prostatomegaly.  07/01/2013 EGD/ED Dr. Laural Golden:  Impression: Small sliding heart hernia otherwise normal EGD. Esophagus dilated by passing 54 French Maloney dilator but no mucosal destruction noted. He possibly has esophageal motility disorder.   Review of Systems  Past Medical History  Diagnosis Date  . Arthritis   . Gout   . Non-ischemic cardiomyopathy   . Hypertension   . Arthritis     left foot   . CAD (coronary artery disease) of artery bypass graft   . COPD (chronic obstructive pulmonary disease)   . History of syphilis   . Type 2 HSV infection of penis   . Erectile dysfunction   . Anxiety disorder   . Dementia   . Anxiety   . Angina   . GERD (gastroesophageal reflux disease)   . Ascending aortic aneurysm    . Lung nodule     RUL nodule noted on abdominal CT    Past Surgical History  Procedure Laterality Date  . Left ankle      Not Done by Dr. Aline Brochure   . S/p  biventricular pacemaker / aicd implantation  02/19/05  . Defribrillator implant    . Crtd upgrade  12/14/2009  . Transthoracic echocardiogram  2011  . Doppler echocardiography  2009  . Insert / replace / remove pacemaker    . Esophagogastroduodenoscopy N/A 07/01/2013    Procedure: ESOPHAGOGASTRODUODENOSCOPY (EGD);  Surgeon: Rogene Houston, MD;  Location: [facility];  Service: Endoscopy;  Laterality: N/A;  145-moved to 1245 Ann notfied pt    Allergies  Allergen Reactions  . Codeine Other (See Comments)    Whelps up  . Enalapril Maleate Other (See Comments)    Whelps up  . Sulfonamide Derivatives Other (See Comments)    Whelps up    Current Outpatient Prescriptions on File Prior to Visit  Medication Sig Dispense Refill  . acetaminophen (TYLENOL) 500 MG tablet Take 1,000 mg by mouth daily as needed for moderate pain.     Marland Kitchen allopurinol (ZYLOPRIM) 100 MG tablet Take 1 tablet (100 mg total) by mouth daily. 90 tablet 1  . Ascorbic Acid (VITAMIN C PO) Take 1 tablet by mouth daily.    Marland Kitchen aspirin (ASPIRIN LOW DOSE) 81 MG EC tablet Take 81 mg by mouth daily.      . furosemide (LASIX) 40 MG tablet Take 1 tablet (40 mg total) by mouth 2 (two) times daily. 180 tablet 1  . losartan (COZAAR) 25 MG tablet Take 1 tablet (25 mg total) by mouth daily. 90 tablet 1  . mirtazapine (REMERON) 15 MG tablet TAKE 1 TABLET BY MOUTH AT BEDTIME 30 tablet 0  . nitroGLYCERIN (NITROSTAT) 0.4 MG SL tablet Place 1 tablet (0.4 mg total) under the tongue every 5 (five) minutes as needed for chest pain. For chest pain 30 tablet 6  . NON FORMULARY 2 mLs. Oxygen. Daily at night and PRN during the day.     Marland Kitchen omeprazole (PRILOSEC) 20 MG capsule Take 1 capsule (20 mg total) by mouth daily. (Patient taking differently: Take 20 mg by mouth 2 (two) times daily before a  meal. ) 30 capsule 3  . ondansetron (ZOFRAN ODT) 4 MG disintegrating tablet Take 1 tablet (4 mg total) by mouth every 8 (eight) hours as needed for nausea or vomiting. 20 tablet 0  . promethazine (PHENERGAN) 25 MG tablet Take 1 tablet (25 mg total) by mouth every 6 (six) hours as needed. 15 tablet 0  . sertraline (ZOLOFT) 50 MG tablet Take 50 mg by mouth daily.  5  . spironolactone (ALDACTONE) 25 MG tablet Take 1 tablet (25 mg total) by mouth daily. 90 tablet 1  . temazepam (RESTORIL) 30 MG capsule Take 1 capsule (30 mg total)  by mouth at bedtime as needed for sleep. 90 capsule 1  . carvedilol (COREG) 25 MG tablet Take 1 tablet (25 mg total) by mouth 2 (two) times daily with a meal. 180 tablet 1  . Cholecalciferol (VITAMIN D PO) Take 2 tablets by mouth daily.    Marland Kitchen HYDROcodone-acetaminophen (NORCO) 5-325 MG per tablet Take 1-2 tablets by mouth every 4 (four) hours as needed. 10 tablet 0  . HYDROcodone-acetaminophen (NORCO) 5-325 MG per tablet Take 2 tablets by mouth every 4 (four) hours as needed. 6 tablet 0   No current facility-administered medications on file prior to visit.         Objective:   Physical Exam  Blood pressure 90/46, pulse 60, temperature 97.5 F (36.4 C), height 5\' 8"  (1.727 m), weight 150 lb 4.8 oz (68.176 kg). Alert and oriented. Skin warm and dry. Oral mucosa is moist.   . Sclera anicteric, conjunctivae is pink. Thyroid not enlarged. No cervical lymphadenopathy. Lungs clear. Heart regular rate and rhythm.  Abdomen is soft. Bowel sounds are positive. No hepatomegaly. No abdominal masses felt. Slight tenderness at umblicus.  No edema to lower extremities.          Assessment & Plan:  Pain located at umbilicus. Much better now since last OV. Started on Isosorbide BID.  Feels better Takes the Percocet x 1 a day. OV in 3 months.

## 2014-05-31 ENCOUNTER — Other Ambulatory Visit: Payer: Self-pay | Admitting: *Deleted

## 2014-05-31 NOTE — Patient Outreach (Signed)
Call to [facility]. Left message for return call to [facility] with her daughter who answered. Royetta Crochet. Laymond Purser, RN, BSN, [place] [phone]

## 2014-06-02 ENCOUNTER — Other Ambulatory Visit: Payer: Self-pay | Admitting: *Deleted

## 2014-06-02 NOTE — Patient Outreach (Signed)
Call attempted to [facility] regarding Home visit. Unable to leave a message, mailbox was full. Will attempt again later. Royetta Crochet. Laymond Purser, RN, BSN, [place] [phone]

## 2014-06-02 NOTE — Patient Outreach (Signed)
Spoke with [name] Macias, who is caregiver and sister in law of patient. Discussed in depth [facility] program services. She reports that patient and his wife have had a lot of appointments over past couple of weeks and also some upcoming. She is requesting time to discuss [facility] program again with patient and his wife and to possibly schedule visit for June.  She states that patient has upcoming appointment with Dr. Luan Pulling, [facility] requested that they discuss [facility] program with Dr. Luan Pulling as he is familiar with our services, she agreed. Plan is to touch base in a couple of weeks to schedule outreach visit. Royetta Crochet. Laymond Purser, RN, BSN, [place] [phone]

## 2014-06-15 ENCOUNTER — Other Ambulatory Visit (HOSPITAL_COMMUNITY): Payer: Self-pay | Admitting: Pulmonary Disease

## 2014-06-15 ENCOUNTER — Ambulatory Visit (HOSPITAL_COMMUNITY)
Admission: RE | Admit: 2014-06-15 | Discharge: 2014-06-15 | Disposition: A | Discharge status: Home / Self Care | Payer: Commercial Managed Care - HMO | Source: Ambulatory Visit | Attending: Pulmonary Disease | Admitting: Pulmonary Disease

## 2014-06-15 DIAGNOSIS — W19XXXA Unspecified fall, initial encounter: Secondary | ICD-10-CM | POA: Diagnosis not present

## 2014-06-15 DIAGNOSIS — G8929 Other chronic pain: Secondary | ICD-10-CM | POA: Insufficient documentation

## 2014-06-15 DIAGNOSIS — M25552 Pain in left hip: Secondary | ICD-10-CM | POA: Insufficient documentation

## 2014-06-16 ENCOUNTER — Other Ambulatory Visit: Payer: Self-pay | Admitting: *Deleted

## 2014-06-16 NOTE — Patient Outreach (Signed)
VM received from patient sister-in-law. She requests for [facility] to wait for outreach visit until later in June as patient and his wife have had a lot of  Upcoming medical appointments. Stating she will call RNCM later in month. Plan to attempt to visit later in month. Royetta Crochet. Laymond Purser, RN, BSN, [place] [phone]

## 2014-06-17 ENCOUNTER — Other Ambulatory Visit: Payer: Self-pay | Admitting: Family Medicine

## 2014-06-20 ENCOUNTER — Encounter: Payer: Self-pay | Admitting: Internal Medicine

## 2014-06-20 ENCOUNTER — Ambulatory Visit (INDEPENDENT_AMBULATORY_CARE_PROVIDER_SITE_OTHER): Payer: Commercial Managed Care - HMO | Admitting: *Deleted

## 2014-06-20 DIAGNOSIS — I428 Other cardiomyopathies: Secondary | ICD-10-CM

## 2014-06-20 DIAGNOSIS — I429 Cardiomyopathy, unspecified: Secondary | ICD-10-CM

## 2014-06-20 LAB — CUP PACEART INCLINIC DEVICE CHECK
Battery Voltage: 3.03 V
Brady Statistic AS VS Percent: 99.31 %
Brady Statistic RA Percent Paced: 0.55 %
Date Time Interrogation Session: 20160606143321
HIGH POWER IMPEDANCE MEASURED VALUE: 254 Ohm
HIGH POWER IMPEDANCE MEASURED VALUE: 304 Ohm
HighPow Impedance: 171 Ohm
HighPow Impedance: 68 Ohm
Lead Channel Impedance Value: 4047 Ohm
Lead Channel Pacing Threshold Amplitude: 0.75 V
Lead Channel Pacing Threshold Amplitude: 0.75 V
Lead Channel Pacing Threshold Pulse Width: 0.4 ms
Lead Channel Setting Pacing Amplitude: 2 V
Lead Channel Setting Pacing Pulse Width: 0.4 ms
Lead Channel Setting Sensing Sensitivity: 0.3 mV
MDC IDC MSMT LEADCHNL LV IMPEDANCE VALUE: 4047 Ohm
MDC IDC MSMT LEADCHNL LV IMPEDANCE VALUE: 4047 Ohm
MDC IDC MSMT LEADCHNL RA IMPEDANCE VALUE: 456 Ohm
MDC IDC MSMT LEADCHNL RA PACING THRESHOLD PULSEWIDTH: 0.4 ms
MDC IDC MSMT LEADCHNL RA SENSING INTR AMPL: 4.75 mV
MDC IDC MSMT LEADCHNL RV IMPEDANCE VALUE: 399 Ohm
MDC IDC MSMT LEADCHNL RV SENSING INTR AMPL: 8.75 mV
MDC IDC SET LEADCHNL RV PACING AMPLITUDE: 2.5 V
MDC IDC SET ZONE DETECTION INTERVAL: 350 ms
MDC IDC STAT BRADY AP VP PERCENT: 0.54 %
MDC IDC STAT BRADY AP VS PERCENT: 0 %
MDC IDC STAT BRADY AS VP PERCENT: 0.14 %
MDC IDC STAT BRADY RV PERCENT PACED: 0.69 %
Zone Setting Detection Interval: 300 ms
Zone Setting Detection Interval: 350 ms
Zone Setting Detection Interval: 450 ms

## 2014-06-20 NOTE — Progress Notes (Signed)
Normal icd check Battery voltage 3.03 V. 2 nst episodes--longest was 16 beats. No changes made. ROV 10-06-14 @ 1400--enrolling in [place]. Pt may cancel appointment if monitor gets set up.

## 2014-07-19 ENCOUNTER — Encounter: Payer: Self-pay | Admitting: *Deleted

## 2014-07-19 NOTE — Patient Outreach (Signed)
[place] [facility]) Care Management  07/19/2014  [name] Macias 1932-10-13 [id number]   Sending case closure letter per protocol. Patient sister in law clear during last conversation she would contact RNCM for appointment when they were ready for visit, no contact received from Sister in law or patient.  Plan to send case closure to patient and Primary care Doctor, and  will reopen case if patient and or family contact RNCM for [facility] services. If no contact will follow case closure protocol. Royetta Crochet. Laymond Purser, RN, BSN, [place] [phone]

## 2014-07-29 ENCOUNTER — Encounter: Payer: Self-pay | Admitting: *Deleted

## 2014-07-29 NOTE — Patient Outreach (Signed)
[place] [facility]) Care Management  07/29/2014  Obert Espindola 04-Jul-1932 [id number]   No response from patient or family regarding case closure letter mailed 07/19/14. Letter sent to Primary Care doctor, Dr. Susann Givens. Plan to inform Lurline Del, CMA to close out patient case. Royetta Crochet. Laymond Purser, RN, BSN, [place] [phone]

## 2014-08-03 NOTE — Patient Outreach (Signed)
[place] [facility]) Care Management  08/03/2014  [name] Macias 09/20/1932 [id number]   Notification from Burgess Amor, RN to close case due to unable to contact patient for [place] Management services.  [name] Macias. [place], [place] Management [place] Assistant Phone: [phone] Fax: [phone]

## 2014-08-30 ENCOUNTER — Ambulatory Visit (INDEPENDENT_AMBULATORY_CARE_PROVIDER_SITE_OTHER): Payer: Commercial Managed Care - HMO | Admitting: Internal Medicine

## 2014-10-06 ENCOUNTER — Encounter: Payer: Self-pay | Admitting: *Deleted

## 2014-10-28 ENCOUNTER — Encounter (HOSPITAL_COMMUNITY): Payer: Self-pay

## 2014-10-28 ENCOUNTER — Inpatient Hospital Stay (HOSPITAL_COMMUNITY)
Admission: EM | Admit: 2014-10-28 | Discharge: 2014-10-31 | DRG: 378 | Disposition: A | Discharge status: Home Health | Payer: Commercial Managed Care - HMO | Attending: Pulmonary Disease | Admitting: Pulmonary Disease

## 2014-10-28 ENCOUNTER — Emergency Department (HOSPITAL_COMMUNITY): Payer: Commercial Managed Care - HMO

## 2014-10-28 ENCOUNTER — Encounter (HOSPITAL_COMMUNITY)
Admission: EM | Disposition: A | Discharge status: Home Health | Payer: Self-pay | Source: Home / Self Care | Attending: Pulmonary Disease

## 2014-10-28 DIAGNOSIS — Z95810 Presence of automatic (implantable) cardiac defibrillator: Secondary | ICD-10-CM | POA: Diagnosis not present

## 2014-10-28 DIAGNOSIS — I130 Hypertensive heart and chronic kidney disease with heart failure and stage 1 through stage 4 chronic kidney disease, or unspecified chronic kidney disease: Secondary | ICD-10-CM | POA: Diagnosis present

## 2014-10-28 DIAGNOSIS — Z8249 Family history of ischemic heart disease and other diseases of the circulatory system: Secondary | ICD-10-CM

## 2014-10-28 DIAGNOSIS — R1084 Generalized abdominal pain: Secondary | ICD-10-CM | POA: Diagnosis present

## 2014-10-28 DIAGNOSIS — F0390 Unspecified dementia without behavioral disturbance: Secondary | ICD-10-CM | POA: Diagnosis present

## 2014-10-28 DIAGNOSIS — F0391 Unspecified dementia with behavioral disturbance: Secondary | ICD-10-CM | POA: Diagnosis present

## 2014-10-28 DIAGNOSIS — K921 Melena: Secondary | ICD-10-CM | POA: Diagnosis present

## 2014-10-28 DIAGNOSIS — K449 Diaphragmatic hernia without obstruction or gangrene: Secondary | ICD-10-CM | POA: Diagnosis present

## 2014-10-28 DIAGNOSIS — N184 Chronic kidney disease, stage 4 (severe): Secondary | ICD-10-CM | POA: Diagnosis present

## 2014-10-28 DIAGNOSIS — Z833 Family history of diabetes mellitus: Secondary | ICD-10-CM | POA: Diagnosis not present

## 2014-10-28 DIAGNOSIS — I429 Cardiomyopathy, unspecified: Secondary | ICD-10-CM | POA: Diagnosis present

## 2014-10-28 DIAGNOSIS — Z8546 Personal history of malignant neoplasm of prostate: Secondary | ICD-10-CM

## 2014-10-28 DIAGNOSIS — Z8042 Family history of malignant neoplasm of prostate: Secondary | ICD-10-CM

## 2014-10-28 DIAGNOSIS — K5731 Diverticulosis of large intestine without perforation or abscess with bleeding: Principal | ICD-10-CM | POA: Diagnosis present

## 2014-10-28 DIAGNOSIS — G4734 Idiopathic sleep related nonobstructive alveolar hypoventilation: Secondary | ICD-10-CM | POA: Diagnosis present

## 2014-10-28 DIAGNOSIS — F03918 Unspecified dementia, unspecified severity, with other behavioral disturbance: Secondary | ICD-10-CM | POA: Diagnosis present

## 2014-10-28 DIAGNOSIS — K5791 Diverticulosis of intestine, part unspecified, without perforation or abscess with bleeding: Secondary | ICD-10-CM | POA: Diagnosis not present

## 2014-10-28 DIAGNOSIS — K922 Gastrointestinal hemorrhage, unspecified: Secondary | ICD-10-CM | POA: Diagnosis present

## 2014-10-28 DIAGNOSIS — R0902 Hypoxemia: Secondary | ICD-10-CM | POA: Diagnosis present

## 2014-10-28 DIAGNOSIS — F411 Generalized anxiety disorder: Secondary | ICD-10-CM | POA: Diagnosis present

## 2014-10-28 DIAGNOSIS — I10 Essential (primary) hypertension: Secondary | ICD-10-CM | POA: Diagnosis present

## 2014-10-28 DIAGNOSIS — J449 Chronic obstructive pulmonary disease, unspecified: Secondary | ICD-10-CM | POA: Diagnosis present

## 2014-10-28 DIAGNOSIS — Z87891 Personal history of nicotine dependence: Secondary | ICD-10-CM

## 2014-10-28 DIAGNOSIS — N183 Chronic kidney disease, stage 3 unspecified: Secondary | ICD-10-CM | POA: Diagnosis present

## 2014-10-28 DIAGNOSIS — D62 Acute posthemorrhagic anemia: Secondary | ICD-10-CM | POA: Diagnosis present

## 2014-10-28 DIAGNOSIS — M109 Gout, unspecified: Secondary | ICD-10-CM | POA: Diagnosis present

## 2014-10-28 DIAGNOSIS — I5022 Chronic systolic (congestive) heart failure: Secondary | ICD-10-CM | POA: Diagnosis present

## 2014-10-28 DIAGNOSIS — M1990 Unspecified osteoarthritis, unspecified site: Secondary | ICD-10-CM | POA: Diagnosis present

## 2014-10-28 DIAGNOSIS — K219 Gastro-esophageal reflux disease without esophagitis: Secondary | ICD-10-CM

## 2014-10-28 DIAGNOSIS — I428 Other cardiomyopathies: Secondary | ICD-10-CM

## 2014-10-28 DIAGNOSIS — R7303 Prediabetes: Secondary | ICD-10-CM | POA: Diagnosis present

## 2014-10-28 HISTORY — DX: Malignant neoplasm of prostate: C61

## 2014-10-28 HISTORY — PX: ESOPHAGOGASTRODUODENOSCOPY: SHX5428

## 2014-10-28 HISTORY — DX: Presence of automatic (implantable) cardiac defibrillator: Z95.810

## 2014-10-28 HISTORY — DX: Gastrointestinal hemorrhage, unspecified: K92.2

## 2014-10-28 HISTORY — DX: Chronic systolic (congestive) heart failure: I50.22

## 2014-10-28 LAB — CBC
HCT: 29.8 % — ABNORMAL LOW (ref 39.0–52.0)
HCT: 34.2 % — ABNORMAL LOW (ref 39.0–52.0)
HEMATOCRIT: 32.3 % — AB (ref 39.0–52.0)
HEMOGLOBIN: 10.6 g/dL — AB (ref 13.0–17.0)
Hemoglobin: 9.8 g/dL — ABNORMAL LOW (ref 13.0–17.0)
Hemoglobin: 11.3 g/dL — ABNORMAL LOW (ref 13.0–17.0)
MCH: 30.7 pg (ref 26.0–34.0)
MCH: 30.1 pg (ref 26.0–34.0)
MCH: 29.8 pg (ref 26.0–34.0)
MCHC: 32.9 g/dL (ref 30.0–36.0)
MCHC: 33 g/dL (ref 30.0–36.0)
MCHC: 32.8 g/dL (ref 30.0–36.0)
MCV: 93.4 fL (ref 78.0–100.0)
MCV: 91 fL (ref 78.0–100.0)
MCV: 90.7 fL (ref 78.0–100.0)
Platelets: 187 K/uL (ref 150–400)
Platelets: 170 K/uL (ref 150–400)
Platelets: 163 10*3/uL (ref 150–400)
RBC: 3.19 MIL/uL — ABNORMAL LOW (ref 4.22–5.81)
RBC: 3.76 MIL/uL — ABNORMAL LOW (ref 4.22–5.81)
RBC: 3.56 MIL/uL — ABNORMAL LOW (ref 4.22–5.81)
RDW: 14.4 % (ref 11.5–15.5)
RDW: 15.4 % (ref 11.5–15.5)
RDW: 15.6 % — AB (ref 11.5–15.5)
WBC: 6.5 K/uL (ref 4.0–10.5)
WBC: 8.2 K/uL (ref 4.0–10.5)
WBC: 8.6 10*3/uL (ref 4.0–10.5)

## 2014-10-28 LAB — COMPREHENSIVE METABOLIC PANEL
ALBUMIN: 3.6 g/dL (ref 3.5–5.0)
ALK PHOS: 95 U/L (ref 38–126)
ALT: 10 U/L — ABNORMAL LOW (ref 17–63)
AST: 18 U/L (ref 15–41)
Anion gap: 6 (ref 5–15)
BUN: 31 mg/dL — AB (ref 6–20)
CO2: 24 mmol/L (ref 22–32)
Calcium: 9 mg/dL (ref 8.9–10.3)
Chloride: 104 mmol/L (ref 101–111)
Creatinine, Ser: 1.79 mg/dL — ABNORMAL HIGH (ref 0.61–1.24)
GFR calc Af Amer: 40 mL/min — ABNORMAL LOW (ref >60)
GFR calc non Af Amer: 34 mL/min — ABNORMAL LOW (ref >60)
GLUCOSE: 110 mg/dL — AB (ref 65–99)
POTASSIUM: 3.3 mmol/L — AB (ref 3.5–5.1)
Sodium: 134 mmol/L — ABNORMAL LOW (ref 135–145)
Total Bilirubin: 0.6 mg/dL (ref 0.3–1.2)
Total Protein: 6.8 g/dL (ref 6.5–8.1)

## 2014-10-28 LAB — CBC WITH DIFFERENTIAL/PLATELET
BASOS ABS: 0 10*3/uL (ref 0.0–0.1)
BASOS PCT: 1 %
EOS ABS: 0.2 10*3/uL (ref 0.0–0.7)
EOS PCT: 3 %
HCT: 34 % — ABNORMAL LOW (ref 39.0–52.0)
Hemoglobin: 11.2 g/dL — ABNORMAL LOW (ref 13.0–17.0)
LYMPHS PCT: 24 %
Lymphs Abs: 1.6 10*3/uL (ref 0.7–4.0)
MCH: 30.5 pg (ref 26.0–34.0)
MCHC: 32.9 g/dL (ref 30.0–36.0)
MCV: 92.6 fL (ref 78.0–100.0)
Monocytes Absolute: 0.6 10*3/uL (ref 0.1–1.0)
Monocytes Relative: 9 %
Neutro Abs: 4.2 10*3/uL (ref 1.7–7.7)
Neutrophils Relative %: 63 %
Platelets: 209 10*3/uL (ref 150–400)
RBC: 3.67 MIL/uL — AB (ref 4.22–5.81)
RDW: 14.4 % (ref 11.5–15.5)
WBC: 6.6 10*3/uL (ref 4.0–10.5)

## 2014-10-28 LAB — MRSA PCR SCREENING: MRSA BY PCR: NEGATIVE

## 2014-10-28 LAB — POC OCCULT BLOOD, ED: FECAL OCCULT BLD: POSITIVE — AB

## 2014-10-28 LAB — PROTIME-INR
INR: 1.33 (ref 0.00–1.49)
PROTHROMBIN TIME: 16.6 s — AB (ref 11.6–15.2)

## 2014-10-28 LAB — TSH: TSH: 1.611 u[IU]/mL (ref 0.350–4.500)

## 2014-10-28 LAB — LACTIC ACID, PLASMA
Lactic Acid, Venous: 0.9 mmol/L (ref 0.5–2.0)
Lactic Acid, Venous: 0.9 mmol/L (ref 0.5–2.0)

## 2014-10-28 LAB — ABO/RH: ABO/RH(D): O POS

## 2014-10-28 LAB — PREPARE RBC (CROSSMATCH)

## 2014-10-28 LAB — I-STAT TROPONIN, ED: Troponin i, poc: 0.06 ng/mL (ref 0.00–0.08)

## 2014-10-28 LAB — LIPASE, BLOOD: Lipase: 27 U/L (ref 22–51)

## 2014-10-28 SURGERY — EGD (ESOPHAGOGASTRODUODENOSCOPY)
Anesthesia: Moderate Sedation

## 2014-10-28 MED ORDER — PANTOPRAZOLE SODIUM 40 MG IV SOLR
40.0000 mg | Freq: Once | INTRAVENOUS | Status: DC
  Filled 2014-10-28: qty 40

## 2014-10-28 MED ORDER — ACETAMINOPHEN 325 MG PO TABS
650.0000 mg | ORAL_TABLET | Freq: Four times a day (QID) | ORAL | Status: DC | PRN
  Administered 2014-10-28: 650 mg via ORAL
  Filled 2014-10-28: qty 2

## 2014-10-28 MED ORDER — PEG 3350-KCL-NABCB-NACL-NASULF 236 G PO SOLR
4000.0000 mL | Freq: Once | ORAL | Status: AC
  Administered 2014-10-29: 4000 mL via ORAL
  Filled 2014-10-28: qty 4000

## 2014-10-28 MED ORDER — IOHEXOL 300 MG/ML  SOLN
75.0000 mL | Freq: Once | INTRAMUSCULAR | Status: AC | PRN
  Administered 2014-10-28: 75 mL via INTRAVENOUS

## 2014-10-28 MED ORDER — SODIUM CHLORIDE 0.9 % IV SOLN
8.0000 mg/h | INTRAVENOUS | Status: DC
  Administered 2014-10-28: 8 mg/h via INTRAVENOUS
  Filled 2014-10-28 (×8): qty 80

## 2014-10-28 MED ORDER — SODIUM CHLORIDE 0.9 % IV SOLN
INTRAVENOUS | Status: DC | PRN
  Administered 2014-10-28: 500 mL via INTRAVENOUS

## 2014-10-28 MED ORDER — STERILE WATER FOR IRRIGATION IR SOLN
Status: DC | PRN
  Administered 2014-10-28: 18:00:00

## 2014-10-28 MED ORDER — CARVEDILOL 12.5 MG PO TABS
25.0000 mg | ORAL_TABLET | Freq: Two times a day (BID) | ORAL | Status: DC
  Administered 2014-10-29 – 2014-10-31 (×4): 25 mg via ORAL
  Filled 2014-10-28 (×4): qty 2

## 2014-10-28 MED ORDER — SODIUM CHLORIDE 0.9 % IV BOLUS (SEPSIS)
1000.0000 mL | Freq: Once | INTRAVENOUS | Status: AC
  Administered 2014-10-28: 1000 mL via INTRAVENOUS

## 2014-10-28 MED ORDER — ACETAMINOPHEN 650 MG RE SUPP: 650.0000 mg | Freq: Four times a day (QID) | RECTAL | Status: DC | PRN

## 2014-10-28 MED ORDER — HYDROMORPHONE HCL 1 MG/ML IJ SOLN
0.5000 mg | INTRAMUSCULAR | Status: DC | PRN
  Administered 2014-10-28: 0.5 mg via INTRAVENOUS
  Filled 2014-10-28: qty 1

## 2014-10-28 MED ORDER — SODIUM CHLORIDE 0.9 % IV BOLUS (SEPSIS)
500.0000 mL | Freq: Once | INTRAVENOUS | Status: AC
  Administered 2014-10-28: 500 mL via INTRAVENOUS

## 2014-10-28 MED ORDER — HYDROMORPHONE HCL 1 MG/ML IJ SOLN: 1.0000 mg | INTRAMUSCULAR | Status: DC | PRN

## 2014-10-28 MED ORDER — ALLOPURINOL 100 MG PO TABS
100.0000 mg | ORAL_TABLET | Freq: Every day | ORAL | Status: DC
  Administered 2014-10-29 – 2014-10-31 (×3): 100 mg via ORAL
  Filled 2014-10-28 (×3): qty 1

## 2014-10-28 MED ORDER — CETYLPYRIDINIUM CHLORIDE 0.05 % MT LIQD
7.0000 mL | Freq: Two times a day (BID) | OROMUCOSAL | Status: DC
  Administered 2014-10-28 – 2014-10-29 (×3): 7 mL via OROMUCOSAL

## 2014-10-28 MED ORDER — MIRTAZAPINE 15 MG PO TABS
15.0000 mg | ORAL_TABLET | Freq: Every day | ORAL | Status: DC
  Administered 2014-10-28 – 2014-10-30 (×3): 15 mg via ORAL
  Filled 2014-10-28 (×3): qty 1

## 2014-10-28 MED ORDER — LOSARTAN POTASSIUM 50 MG PO TABS
25.0000 mg | ORAL_TABLET | Freq: Every day | ORAL | Status: DC
  Administered 2014-10-29 – 2014-10-31 (×3): 25 mg via ORAL
  Filled 2014-10-28 (×3): qty 1

## 2014-10-28 MED ORDER — MIDAZOLAM HCL 5 MG/5ML IJ SOLN
INTRAMUSCULAR | Status: AC
  Filled 2014-10-28: qty 10

## 2014-10-28 MED ORDER — SERTRALINE HCL 50 MG PO TABS
50.0000 mg | ORAL_TABLET | Freq: Every day | ORAL | Status: DC
  Administered 2014-10-29 – 2014-10-31 (×3): 50 mg via ORAL
  Filled 2014-10-28 (×3): qty 1

## 2014-10-28 MED ORDER — PANTOPRAZOLE SODIUM 40 MG IV SOLR: 40.0000 mg | Freq: Two times a day (BID) | INTRAVENOUS | Status: DC

## 2014-10-28 MED ORDER — ONDANSETRON HCL 4 MG/2ML IJ SOLN: 4.0000 mg | Freq: Four times a day (QID) | INTRAMUSCULAR | Status: DC | PRN

## 2014-10-28 MED ORDER — BUTAMBEN-TETRACAINE-BENZOCAINE 2-2-14 % EX AERO
INHALATION_SPRAY | CUTANEOUS | Status: DC | PRN
  Administered 2014-10-28: 1 via TOPICAL

## 2014-10-28 MED ORDER — POTASSIUM CHLORIDE 10 MEQ/100ML IV SOLN
10.0000 meq | INTRAVENOUS | Status: AC
  Administered 2014-10-28 (×3): 10 meq via INTRAVENOUS
  Filled 2014-10-28 (×3): qty 100

## 2014-10-28 MED ORDER — SODIUM CHLORIDE 0.9 % IV SOLN: Freq: Once | INTRAVENOUS | Status: DC

## 2014-10-28 MED ORDER — POTASSIUM CHLORIDE IN NACL 20-0.9 MEQ/L-% IV SOLN
INTRAVENOUS | Status: DC
  Administered 2014-10-28 – 2014-10-29 (×2): via INTRAVENOUS

## 2014-10-28 MED ORDER — ISOSORBIDE DINITRATE 5 MG PO TABS
5.0000 mg | ORAL_TABLET | Freq: Two times a day (BID) | ORAL | Status: DC
  Administered 2014-10-29 – 2014-10-31 (×5): 5 mg via ORAL
  Filled 2014-10-28 (×9): qty 1

## 2014-10-28 MED ORDER — MEPERIDINE HCL 50 MG/ML IJ SOLN
INTRAMUSCULAR | Status: DC | PRN
Start: 2014-10-28 — End: 2014-10-30
  Administered 2014-10-28 – 2014-10-29 (×2): 20 mg via INTRAVENOUS
  Administered 2014-10-29: 10 mg via INTRAVENOUS

## 2014-10-28 MED ORDER — HYDROCODONE-ACETAMINOPHEN 5-325 MG PO TABS
1.0000 | ORAL_TABLET | ORAL | Status: DC | PRN
  Administered 2014-10-29 – 2014-10-30 (×2): 1 via ORAL
  Filled 2014-10-28 (×2): qty 1

## 2014-10-28 MED ORDER — PANTOPRAZOLE SODIUM 40 MG IV SOLR
40.0000 mg | Freq: Every day | INTRAVENOUS | Status: DC
  Filled 2014-10-28: qty 40

## 2014-10-28 MED ORDER — IOHEXOL 300 MG/ML  SOLN
50.0000 mL | Freq: Once | INTRAMUSCULAR | Status: AC | PRN
  Administered 2014-10-28: 50 mL via ORAL

## 2014-10-28 MED ORDER — MIDAZOLAM HCL 5 MG/5ML IJ SOLN
INTRAMUSCULAR | Status: DC | PRN
  Administered 2014-10-28 – 2014-10-29 (×5): 1 mg via INTRAVENOUS

## 2014-10-28 MED ORDER — FENTANYL CITRATE (PF) 100 MCG/2ML IJ SOLN
50.0000 ug | Freq: Once | INTRAMUSCULAR | Status: AC
  Administered 2014-10-28: 50 ug via INTRAVENOUS
  Filled 2014-10-28: qty 2

## 2014-10-28 MED ORDER — LEVALBUTEROL HCL 0.63 MG/3ML IN NEBU: 0.6300 mg | INHALATION_SOLUTION | Freq: Four times a day (QID) | RESPIRATORY_TRACT | Status: DC | PRN

## 2014-10-28 MED ORDER — NITROGLYCERIN 0.4 MG SL SUBL: 0.4000 mg | SUBLINGUAL_TABLET | SUBLINGUAL | Status: DC | PRN

## 2014-10-28 MED ORDER — MEPERIDINE HCL 50 MG/ML IJ SOLN
INTRAMUSCULAR | Status: AC
  Filled 2014-10-28: qty 1

## 2014-10-28 MED ORDER — ONDANSETRON HCL 4 MG PO TABS: 4.0000 mg | ORAL_TABLET | Freq: Four times a day (QID) | ORAL | Status: DC | PRN

## 2014-10-28 MED ORDER — SODIUM CHLORIDE 0.9 % IV SOLN
80.0000 mg | Freq: Once | INTRAVENOUS | Status: DC
  Filled 2014-10-28: qty 80

## 2014-10-28 MED ORDER — ONDANSETRON HCL 4 MG/2ML IJ SOLN
4.0000 mg | Freq: Once | INTRAMUSCULAR | Status: AC
  Administered 2014-10-28: 4 mg via INTRAVENOUS
  Filled 2014-10-28: qty 2

## 2014-10-28 MED ORDER — FUROSEMIDE 10 MG/ML IJ SOLN
20.0000 mg | Freq: Once | INTRAMUSCULAR | Status: AC
  Administered 2014-10-28: 20 mg via INTRAVENOUS
  Filled 2014-10-28: qty 2

## 2014-10-28 MED ORDER — PANTOPRAZOLE SODIUM 40 MG IV SOLR
40.0000 mg | Freq: Once | INTRAVENOUS | Status: AC
  Administered 2014-10-28: 40 mg via INTRAVENOUS
  Filled 2014-10-28: qty 40

## 2014-10-28 NOTE — ED Notes (Signed)
Spoke with EDP re pain med for pt prior to transport. Pt resting with eyes closed. Pain was rated at 9 during shift change. Current SBP of 95. EDP stated to transport pt and admitting physician could assess the pt and order pain med of their choice.

## 2014-10-28 NOTE — ED Notes (Signed)
Rectal Bleeding onset this am.  One large stool with blood and also in the commode.  Pt also c/o lower abd pain

## 2014-10-28 NOTE — Consult Note (Addendum)
Referring Provider: Rexene Alberts, MD  Primary Care Physician:  Alonza Bogus, MD Primary Gastroenterologist:  Dr. Laural Golden  Reason for Consultation:    Acute GI bleed and anemia.  HPI:   Patient is 81 year old African medical male who woke up around 3:00 this morning with an urge to have a bowel movement but instead passed large amount of bright red blood per rectum. He did not experience abdominal pain nausea vomiting lightheadedness or diaphoresis. Patient was immediately brought to emergency room for evaluation. His hemoglobin was 11.2 g. He had 3 bloody bowel movements in emergency room. Patient was admitted to ICU. He another bowel movement about 4 hours ago. His hemoglobin has dropped to 9.8 g and is receiving second unit of PRBCs. Patient has been complaining of epigastric pain for the last 5 days since he found out that his older brother had passed away. He has been using times on as-needed basis. He denies heartburn or dysphagia. He has had a good appetite and has not lost any weight. He is on low-dose aspirin but does not take other OTC NSAIDs. Patient underwent EGD in June last year for dysphagia and noted to have small sliding hiatal hernia and felt to have esophageal motility disorder. He has remained on low-dose aspirin. Review of the systems is positive for memory loss and his wife is planning for him to be seen by Dr. Luan Pulling. Patient is not sure the last time he had a colonoscopy. Limited epic records suggests he had a colonoscopy in 2001. Family history is negative for CRC. He and his wife lives in [place]. One of their daughter,s died in 25-Feb-2022 this year due to pulmonary embolism postop. Other three children are in good health. Patient does not smoke cigarettes or drink alcohol.   Past Medical History  Diagnosis Date  . Arthritis   . Gout   . Non-ischemic cardiomyopathy ([place])   . Hypertension   . Arthritis     left foot   . CAD (coronary artery  disease) of artery bypass graft   . COPD (chronic obstructive pulmonary disease) ([place])   . History of syphilis   . Type 2 HSV infection of penis   . Erectile dysfunction   . Anxiety disorder   . Dementia   . Anxiety   . Angina   . GERD (gastroesophageal reflux disease)   . Ascending aortic aneurysm ([place])   . Lung nodule     RUL nodule noted on abdominal CT  . Automatic implantable cardioverter-defibrillator in situ 07/20/2008    Qualifier: Diagnosis of  By: Lovena Le, MD, Martyn Malay   . Chronic systolic heart failure ([place]) 11/28/2009    Qualifier: Diagnosis of  By: Lovena Le, MD, Martyn Malay   . Prostate cancer [facility]) 02/03/2013    Diagnosed in 01/2013 by Dr Michela Pitcher adenocarcinoma felt to be localized, gleason score 6 with a pSA level of 8.4. No bone scan by Dr Michela Pitcher, referred to oncology, no radiation treatment offered    . Chronic kidney disease     Stage III-IV    Past Surgical History  Procedure Laterality Date  . Left ankle      Not Done by Dr. Aline Brochure   . S/p  biventricular pacemaker / aicd implantation  02/19/05  . Defribrillator implant    . Crtd upgrade  12/14/2009  . Transthoracic echocardiogram  2011  . Doppler echocardiography  2009  . Insert / replace / remove pacemaker    . Esophagogastroduodenoscopy N/A 07/01/2013  Procedure: ESOPHAGOGASTRODUODENOSCOPY (EGD);  Surgeon: Rogene Houston, MD;  Location: [facility];  Service: Endoscopy;  Laterality: N/A;  145-moved to [place] notfied pt    Prior to Admission medications   Medication Sig Start Date End Date Taking? Authorizing Provider  acetaminophen (TYLENOL) 500 MG tablet Take 1,000 mg by mouth daily as needed for moderate pain.    Yes Historical Provider, MD  allopurinol (ZYLOPRIM) 100 MG tablet Take 1 tablet (100 mg total) by mouth daily. 11/03/13  Yes Fayrene Helper, MD  Ascorbic Acid (VITAMIN C PO) Take 1 tablet by mouth daily.   Yes Historical Provider, MD  aspirin (ASPIRIN LOW DOSE) 81 MG EC  tablet Take 81 mg by mouth daily.     Yes Historical Provider, MD  carvedilol (COREG) 25 MG tablet Take 1 tablet (25 mg total) by mouth 2 (two) times daily with a meal. 11/03/13  Yes Fayrene Helper, MD  Cholecalciferol (VITAMIN D PO) Take 2 tablets by mouth daily.   Yes Historical Provider, MD  furosemide (LASIX) 40 MG tablet Take 1 tablet (40 mg total) by mouth 2 (two) times daily. 11/03/13  Yes Fayrene Helper, MD  HYDROcodone-acetaminophen (NORCO) 5-325 MG per tablet Take 1-2 tablets by mouth every 4 (four) hours as needed. 05/17/14  Yes Elnora Morrison, MD  HYDROcodone-acetaminophen (NORCO) 5-325 MG per tablet Take 2 tablets by mouth every 4 (four) hours as needed. 05/17/14  Yes Elnora Morrison, MD  isosorbide dinitrate (ISORDIL) 5 MG tablet Take 5 mg by mouth 2 (two) times daily.   Yes Historical Provider, MD  losartan (COZAAR) 25 MG tablet Take 1 tablet (25 mg total) by mouth daily. 11/03/13  Yes Fayrene Helper, MD  mirtazapine (REMERON) 15 MG tablet TAKE 1 TABLET BY MOUTH AT BEDTIME 06/17/14  Yes Fayrene Helper, MD  nitroGLYCERIN (NITROSTAT) 0.4 MG SL tablet Place 1 tablet (0.4 mg total) under the tongue every 5 (five) minutes as needed for chest pain. For chest pain 03/18/14  Yes Evans Lance, MD  omeprazole (PRILOSEC) 20 MG capsule Take 1 capsule (20 mg total) by mouth daily. Patient taking differently: Take 20 mg by mouth 2 (two) times daily before a meal.  10/11/13  Yes Butch Penny, NP  ondansetron (ZOFRAN ODT) 4 MG disintegrating tablet Take 1 tablet (4 mg total) by mouth every 8 (eight) hours as needed for nausea or vomiting. 05/10/14  Yes Kristen N Ward, DO  promethazine (PHENERGAN) 25 MG tablet Take 1 tablet (25 mg total) by mouth every 6 (six) hours as needed. 04/22/14  Yes [name], MD  sertraline (ZOLOFT) 50 MG tablet Take 50 mg by mouth daily. 04/11/14  Yes Historical Provider, MD  spironolactone (ALDACTONE) 25 MG tablet Take 1 tablet (25 mg total) by mouth daily. 11/03/13  Yes  Fayrene Helper, MD  NON FORMULARY 2 mLs. Oxygen. Daily at night and PRN during the day.     Historical Provider, MD  temazepam (RESTORIL) 30 MG capsule Take 1 capsule (30 mg total) by mouth at bedtime as needed for sleep. 11/29/13 05/30/14  Fayrene Helper, MD    Current Facility-Administered Medications  Medication Dose Route Frequency Provider Last Rate Last Dose  . 0.9 % NaCl with KCl 20 mEq/ L  infusion   Intravenous Continuous Rexene Alberts, MD      . acetaminophen (TYLENOL) tablet 650 mg  650 mg Oral Q6H PRN Rexene Alberts, MD       Or  .  acetaminophen (TYLENOL) suppository 650 mg  650 mg Rectal Q6H PRN Rexene Alberts, MD      . Derrill Memo ON 10/29/2014] allopurinol (ZYLOPRIM) tablet 100 mg  100 mg Oral Daily Rexene Alberts, MD      . antiseptic oral rinse (CPC / CETYLPYRIDINIUM CHLORIDE 0.05%) solution 7 mL  7 mL Mouth Rinse BID Rexene Alberts, MD   7 mL at 10/28/14 1100  . [START ON 10/29/2014] carvedilol (COREG) tablet 25 mg  25 mg Oral BID WC Rexene Alberts, MD      . furosemide (LASIX) injection 20 mg  20 mg Intravenous Once Rexene Alberts, MD      . HYDROcodone-acetaminophen (NORCO/VICODIN) 5-325 MG per tablet 1-2 tablet  1-2 tablet Oral Q4H PRN Rexene Alberts, MD      . HYDROmorphone (DILAUDID) injection 0.5 mg  0.5 mg Intravenous Q3H PRN Rexene Alberts, MD   0.5 mg at 10/28/14 1222  . [START ON 10/29/2014] isosorbide dinitrate (ISORDIL) tablet 5 mg  5 mg Oral BID Rexene Alberts, MD      . levalbuterol Penne Lash) nebulizer solution 0.63 mg  0.63 mg Nebulization Q6H PRN Rexene Alberts, MD      . Derrill Memo ON 10/29/2014] losartan (COZAAR) tablet 25 mg  25 mg Oral Daily Rexene Alberts, MD      . mirtazapine (REMERON) tablet 15 mg  15 mg Oral QHS Rexene Alberts, MD      . nitroGLYCERIN (NITROSTAT) SL tablet 0.4 mg  0.4 mg Sublingual Q5 min PRN Rexene Alberts, MD      . ondansetron [facility]) tablet 4 mg  4 mg Oral Q6H PRN Rexene Alberts, MD       Or  . ondansetron [facility]) injection 4 mg  4 mg  Intravenous Q6H PRN Rexene Alberts, MD      . pantoprazole (PROTONIX) 80 mg in sodium chloride 0.9 % 250 mL (0.32 mg/mL) infusion  8 mg/hr Intravenous Continuous Merrily Pew, MD 25 mL/hr at 10/28/14 0902 8 mg/hr at 10/28/14 0902  . potassium chloride 10 mEq in 100 mL IVPB  10 mEq Intravenous Q1 Hr x 3 Rexene Alberts, MD   10 mEq at 10/28/14 1137  . [START ON 10/29/2014] sertraline (ZOLOFT) tablet 50 mg  50 mg Oral Daily Rexene Alberts, MD        Allergies as of 10/28/2014 - Review Complete 10/28/2014  Allergen Reaction Noted  . Codeine Other (See Comments) 01/22/2011  . Enalapril maleate Other (See Comments) 10/03/2008  . Sulfonamide derivatives Other (See Comments)     Family History  Problem Relation Age of Onset  . Heart failure Father   . Prostate cancer Brother   . Diabetes Brother   . Hypertension Brother   . Heart failure Brother     Social History   Social History  . Marital Status: Married    Spouse Name: N/A  . Number of Children: 3  . Years of Education: N/A   Occupational History  . disabled     Social History Main Topics  . Smoking status: Former Smoker -- 1.00 packs/day for 10 years    Types: Cigarettes    Quit date: 01/15/1963  . Smokeless tobacco: Never Used  . Alcohol Use: No  . Drug Use: No  . Sexual Activity: Not Currently   Other Topics Concern  . Not on file   Social History Narrative    Review of Systems: See HPI, otherwise normal ROS  Physical Exam: Temp:  [97.5 F (36.4 C)-97.8 F (36.6 C)] 97.8  F (36.6 C) (10/14 1155) Pulse Rate:  [64-87] 70 (10/14 1200) Resp:  [16-34] 27 (10/14 1200) BP: (89-132)/(44-98) 122/69 mmHg (10/14 1200) SpO2:  [93 %-100 %] 100 % (10/14 1200) Weight:  [158 lb 1.1 oz (71.7 kg)] 158 lb 1.1 oz (71.7 kg) (10/14 0905) Last BM Date: 10/28/14 Patient is alert and in no acute distress. Conjunctiva is pink. Sclera is nonicteric. Oropharyngeal mucosa is normal. He has few of his own teeth and lower jaw and is  edentulous in upper jaw. No neck masses or thyromegaly noted. AICD device located in left pectoral region. Cardiac exam with regular rhythm normal S1 and S2. No murmur or gallop noted. Lungs are clear to auscultation. Abdomen is symmetrical. Bowel sounds are normal. On palpation is soft with mild midepigastric tenderness. Small amount like a hernia which is completely reducible and nontender. No peripheral edema or clubbing noted.    Lab Results:  Recent Labs  10/28/14 0400 10/28/14 0707  WBC 6.6 6.5  HGB 11.2* 9.8*  HCT 34.0* 29.8*  PLT 209 187   BMET  Recent Labs  10/28/14 0400  NA 134*  K 3.3*  CL 104  CO2 24  GLUCOSE 110*  BUN 31*  CREATININE 1.79*  CALCIUM 9.0   LFT  Recent Labs  10/28/14 0400  PROT 6.8  ALBUMIN 3.6  AST 18  ALT 10*  ALKPHOS 95  BILITOT 0.6   PT/INR  Recent Labs  10/28/14 0932  LABPROT 16.6*  INR 1.33    Studies/Results: Ct Abdomen Pelvis W Contrast  10/28/2014  CLINICAL DATA:  Low abdominal pain with large amounts of bright red blood in stools. EXAM: CT ABDOMEN AND PELVIS WITH CONTRAST TECHNIQUE: Multidetector CT imaging of the abdomen and pelvis was performed using the standard protocol following bolus administration of intravenous contrast. CONTRAST:  33mL OMNIPAQUE IOHEXOL 300 MG/ML SOLN, 39mL OMNIPAQUE IOHEXOL 300 MG/ML SOLN COMPARISON:  05/17/2014 FINDINGS: Atelectasis in the lung bases.  Diffuse cardiac enlargement. The liver, spleen, gallbladder, pancreas, adrenal glands, inferior vena cava, and retroperitoneal lymph nodes are unremarkable. Multiple bilateral renal cysts. Largest on the left measures 3.8 cm diameter. No hydronephrosis in either kidney. Nephrograms appear symmetrical. Calcification of the abdominal aorta and branch vessels with ectatic iliac arteries. Calcific atherosclerotic changes in the celiac axis and superior mesenteric artery although the vessels appear patent. Focal stenosis not excluded. Stomach, small  bowel, and colon are not abnormally distended. There is a small umbilical hernia containing small bowel but without proximal obstruction. Diffusely stool-filled colon with scattered diverticula. No free air or free fluid in the abdomen. Pelvis: Prostate gland is mildly enlarged. Bladder wall is not thickened. No free or loculated pelvic fluid collections. No pelvic mass or lymphadenopathy. Appendix is normal. Small inguinal hernias containing fat. Degenerative changes throughout the spine. No destructive bone lesions. IMPRESSION: Small umbilical hernia containing small bowel. No bowel obstruction or inflammation. Diverticulosis throughout the colon without inflammatory change. Bilateral fat containing inguinal hernias. No specific abnormality demonstrated to explain history of rectal bleeding. Electronically Signed   By: Lucienne Capers M.D.   On: 10/28/2014 06:01    Assessment; Patient is 81 year old African male who presents with sudden onset of rectal bleeding. While patients vital signs are stable his hemoglobin has dropped significantly and he is receiving PRBCs. Patient is on low-dose aspirin and is been having epigastric pain for the last 5 days since his brother died. Suspect we are dealing with lower GI bleed or bleed from colonic diverticulosis but given  symptoms pertaining to upper GI tract peptic ulcer disease needs to be ruled out.   Recommendations; Continue IV pantoprazole for now.  Diagnostic EGD later today at bedside. If no bleeding lesion identified in upper GI track Will proceed with colonoscopy.   LOS: 0 days   Elia Keenum U  10/28/2014, 12:31 PM

## 2014-10-28 NOTE — H&P (Signed)
[facility] History and Physical  Doak Mah VZC:[id number] DOB: 07-19-1932 DOA: 10/28/2014  Referring physician: ED physician, Dr. Dayna Barker PCP: Alonza Bogus, MD   Chief Complaint: Abdominal cramping and bright red blood per rectum.  HPI: [name] Macias is a 81 y.o. male with a history of nonischemic cardiomyopathy, ejection fraction 15-20% per echo 2011, ICD implantation, and chronic kidney disease, who presents with a chief complaint of lower abdominal cramping and bloody stools. The patient was in his usual state of health until approximately 2:58 AM this morning when he developed right greater than left lower abdominal cramping. This was followed by a large bloody stool with red blood and with blood clots. Patient says that there may have been a small amount of stool, but it was mostly blood. He denies black tarry stools. He had no complaints of nausea and vomiting at home, but apparently had one episode of emesis in the ED. He denies bright red blood in the hematemesis. He denies coffee grounds emesis. He has had some intermittent shortness of breath, but no outright chest pain. He denies cough, pleurisy, pain with urination, or swelling in his legs. He takes 81 mg aspirin daily, but denies any other NSAID use. He denies alcohol use.   In the ED, he was afebrile and hemodynamically stable, but his blood pressure was on the low end of normal at 128 systolically. He had 2 witnessed large bloody stools in the ED. CT of his abdomen and pelvis revealed small umbilical hernia, diverticulosis throughout the colon, and bilateral renal cysts. His lab data were significant for a hemoglobin of 11.2 with a follow-up of 9.8, normal troponin I, sodium 134, potassium of 3.3, BUN of 31, and creatinine of 1.79. He is being admitted for further evaluation and management.   Review of Systems:  As above in his present illness. In addition, he had URI symptoms last week; otherwise review systems is  unremarkable.  Past Medical History  Diagnosis Date  . Arthritis   . Gout   . Non-ischemic cardiomyopathy ([place])   . Hypertension   . Arthritis     left foot   . CAD (coronary artery disease) of artery bypass graft   . COPD (chronic obstructive pulmonary disease) ([place])   . History of syphilis   . Type 2 HSV infection of penis   . Erectile dysfunction   . Anxiety disorder   . Dementia   . Anxiety   . Angina   . GERD (gastroesophageal reflux disease)   . Ascending aortic aneurysm ([place])   . Lung nodule     RUL nodule noted on abdominal CT  . Automatic implantable cardioverter-defibrillator in situ 07/20/2008    Qualifier: Diagnosis of  By: Lovena Le, MD, Martyn Malay   . Chronic systolic heart failure ([place]) 11/28/2009    Qualifier: Diagnosis of  By: Lovena Le, MD, Martyn Malay   . Prostate cancer [facility]) 02/03/2013    Diagnosed in 01/2013 by Dr Michela Pitcher adenocarcinoma felt to be localized, gleason score 6 with a pSA level of 8.4. No bone scan by Dr Michela Pitcher, referred to oncology, no radiation treatment offered    . Chronic kidney disease     Stage III-IV   Past Surgical History  Procedure Laterality Date  . Left ankle      Not Done by Dr. Aline Brochure   . S/p  biventricular pacemaker / aicd implantation  02/19/05  . Defribrillator implant    . Crtd upgrade  12/14/2009  . Transthoracic echocardiogram  2011  . Doppler echocardiography  2009  . Insert / replace / remove pacemaker    . Esophagogastroduodenoscopy N/A 07/01/2013    Procedure: ESOPHAGOGASTRODUODENOSCOPY (EGD);  Surgeon: Rogene Houston, MD;  Location: [facility];  Service: Endoscopy;  Laterality: N/A;  145-moved to 1245 Ann notfied pt   Social History: He is married. He has 3 children. He is retired. He reports that he quit smoking about 51 years ago. His smoking use included Cigarettes. He had a 10 pack-year smoking history. He has never used smokeless tobacco. He reports that he does not drink alcohol or use illicit  drugs.  Allergies  Allergen Reactions  . Codeine Other (See Comments)    [place]  . Enalapril Maleate Other (See Comments)    Whelps up  . Sulfonamide Derivatives Other (See Comments)    Whelps up    Family History  Problem Relation Age of Onset  . Heart failure Father   . Prostate cancer Brother   . Diabetes Brother   . Hypertension Brother   . Heart failure Brother     Prior to Admission medications   Medication Sig Start Date End Date Taking? Authorizing Provider  acetaminophen (TYLENOL) 500 MG tablet Take 1,000 mg by mouth daily as needed for moderate pain.     Historical Provider, MD  allopurinol (ZYLOPRIM) 100 MG tablet Take 1 tablet (100 mg total) by mouth daily. 11/03/13   Fayrene Helper, MD  Ascorbic Acid (VITAMIN C PO) Take 1 tablet by mouth daily.    Historical Provider, MD  aspirin (ASPIRIN LOW DOSE) 81 MG EC tablet Take 81 mg by mouth daily.      Historical Provider, MD  carvedilol (COREG) 25 MG tablet Take 1 tablet (25 mg total) by mouth 2 (two) times daily with a meal. 11/03/13   Fayrene Helper, MD  Cholecalciferol (VITAMIN D PO) Take 2 tablets by mouth daily.    Historical Provider, MD  furosemide (LASIX) 40 MG tablet Take 1 tablet (40 mg total) by mouth 2 (two) times daily. 11/03/13   Fayrene Helper, MD  HYDROcodone-acetaminophen (NORCO) 5-325 MG per tablet Take 1-2 tablets by mouth every 4 (four) hours as needed. 05/17/14   Elnora Morrison, MD  HYDROcodone-acetaminophen (NORCO) 5-325 MG per tablet Take 2 tablets by mouth every 4 (four) hours as needed. 05/17/14   Elnora Morrison, MD  isosorbide dinitrate (ISORDIL) 5 MG tablet Take 5 mg by mouth 2 (two) times daily.    Historical Provider, MD  losartan (COZAAR) 25 MG tablet Take 1 tablet (25 mg total) by mouth daily. 11/03/13   Fayrene Helper, MD  mirtazapine (REMERON) 15 MG tablet TAKE 1 TABLET BY MOUTH AT BEDTIME 06/17/14   Fayrene Helper, MD  nitroGLYCERIN (NITROSTAT) 0.4 MG SL tablet Place 1 tablet  (0.4 mg total) under the tongue every 5 (five) minutes as needed for chest pain. For chest pain 03/18/14   Evans Lance, MD  NON FORMULARY 2 mLs. Oxygen. Daily at night and PRN during the day.     Historical Provider, MD  omeprazole (PRILOSEC) 20 MG capsule Take 1 capsule (20 mg total) by mouth daily. Patient taking differently: Take 20 mg by mouth 2 (two) times daily before a meal.  10/11/13   Butch Penny, NP  ondansetron (ZOFRAN ODT) 4 MG disintegrating tablet Take 1 tablet (4 mg total) by mouth every 8 (eight) hours as needed for nausea or vomiting. 05/10/14   [place],  DO  promethazine (PHENERGAN) 25 MG tablet Take 1 tablet (25 mg total) by mouth every 6 (six) hours as needed. 04/22/14   [name], MD  sertraline (ZOLOFT) 50 MG tablet Take 50 mg by mouth daily. 04/11/14   Historical Provider, MD  spironolactone (ALDACTONE) 25 MG tablet Take 1 tablet (25 mg total) by mouth daily. 11/03/13   Fayrene Helper, MD  temazepam (RESTORIL) 30 MG capsule Take 1 capsule (30 mg total) by mouth at bedtime as needed for sleep. 11/29/13 05/30/14  Fayrene Helper, MD   Physical Exam: Filed Vitals:   10/28/14 0445 10/28/14 0501 10/28/14 0600 10/28/14 0711  BP: 132/71 114/77 105/68 110/67  Pulse: 71 79 75 71  Temp:    97.8 F (36.6 C)  TempSrc:    Oral  Resp:  20  18  SpO2: 95% 100% 94% 96%    Wt Readings from Last 3 Encounters:  05/30/14 68.176 kg (150 lb 4.8 oz)  05/17/14 74.39 kg (164 lb)  05/17/14 71.668 kg (158 lb)    General:  81 year old African-American man who appears slightly ill, but in no acute distress. Eyes: PERRL, normal lids, irises & conjunctiva; conjunctivae are clear and sclerae white. ENT: grossly normal hearing; oropharynx with mildly dry mucous membranes. No teeth. No posterior exudates or erythema. Neck: no LAD, masses or thyromegaly Cardiovascular: S1, S2, with occasional ectopy and soft systolic murmur. Telemetry: Sinus rhythm with occasional PVC.  Respiratory:  CTA bilaterally, no w/r/r. Normal respiratory effort. Abdomen: Positive bowel sounds, soft, minimal tenderness over the right and left lower quadrants; no masses palpated; no distention. Skin: no rash or induration seen on limited exam Musculoskeletal: grossly normal tone BUE/BLE Psychiatric: grossly normal mood and affect, speech fluent and appropriate Neurologic: grossly non-focal; cranial nerves II through XII grossly intact.           Labs on Admission:  Basic Metabolic Panel:  Recent Labs Lab 10/28/14 0400  NA 134*  K 3.3*  CL 104  CO2 24  GLUCOSE 110*  BUN 31*  CREATININE 1.79*  CALCIUM 9.0   Liver Function Tests:  Recent Labs Lab 10/28/14 0400  AST 18  ALT 10*  ALKPHOS 95  BILITOT 0.6  PROT 6.8  ALBUMIN 3.6    Recent Labs Lab 10/28/14 0400  LIPASE 27   No results for input(s): AMMONIA in the last 168 hours. CBC:  Recent Labs Lab 10/28/14 0400 10/28/14 0707  WBC 6.6 6.5  NEUTROABS 4.2  --   HGB 11.2* 9.8*  HCT 34.0* 29.8*  MCV 92.6 93.4  PLT 209 187   Cardiac Enzymes: No results for input(s): CKTOTAL, CKMB, CKMBINDEX, TROPONINI in the last 168 hours.  BNP (last 3 results)  Recent Labs  01/24/14 1235  BNP 1108.0*    ProBNP (last 3 results) No results for input(s): PROBNP in the last 8760 hours.  CBG: No results for input(s): GLUCAP in the last 168 hours.  Radiological Exams on Admission: Ct Abdomen Pelvis W Contrast  10/28/2014  CLINICAL DATA:  Low abdominal pain with large amounts of bright red blood in stools. EXAM: CT ABDOMEN AND PELVIS WITH CONTRAST TECHNIQUE: Multidetector CT imaging of the abdomen and pelvis was performed using the standard protocol following bolus administration of intravenous contrast. CONTRAST:  56mL OMNIPAQUE IOHEXOL 300 MG/ML SOLN, 45mL OMNIPAQUE IOHEXOL 300 MG/ML SOLN COMPARISON:  05/17/2014 FINDINGS: Atelectasis in the lung bases.  Diffuse cardiac enlargement. The liver, spleen, gallbladder, pancreas,  adrenal glands, inferior vena cava, and retroperitoneal  lymph nodes are unremarkable. Multiple bilateral renal cysts. Largest on the left measures 3.8 cm diameter. No hydronephrosis in either kidney. Nephrograms appear symmetrical. Calcification of the abdominal aorta and branch vessels with ectatic iliac arteries. Calcific atherosclerotic changes in the celiac axis and superior mesenteric artery although the vessels appear patent. Focal stenosis not excluded. Stomach, small bowel, and colon are not abnormally distended. There is a small umbilical hernia containing small bowel but without proximal obstruction. Diffusely stool-filled colon with scattered diverticula. No free air or free fluid in the abdomen. Pelvis: Prostate gland is mildly enlarged. Bladder wall is not thickened. No free or loculated pelvic fluid collections. No pelvic mass or lymphadenopathy. Appendix is normal. Small inguinal hernias containing fat. Degenerative changes throughout the spine. No destructive bone lesions. IMPRESSION: Small umbilical hernia containing small bowel. No bowel obstruction or inflammation. Diverticulosis throughout the colon without inflammatory change. Bilateral fat containing inguinal hernias. No specific abnormality demonstrated to explain history of rectal bleeding. Electronically Signed   By: Lucienne Capers M.D.   On: 10/28/2014 06:01    EKG: Independently reviewed.   Assessment/Plan Principal Problem:   Hematochezia Active Problems:   Automatic implantable cardioverter-defibrillator in situ   Acute blood loss anemia   GI (gastrointestinal bleed)   Nocturnal hypoxia   Nonischemic cardiomyopathy (HCC)   Chronic systolic heart failure (HCC)   Generalized abdominal cramping   Prediabetes   Essential hypertension   Stage III chronic kidney disease   1. Hematochezia/GI bleeding. Etiology unclear, but possibly secondary to diverticulosis that was seen on the admission CT of the abdomen/pelvis.  Patient underwent EGD 06/2013 per Dr. Laural Golden and it was unremarkable with exception of a small hiatal hernia. Patient had 2 witnessed large bloody stools in the ED and 1 in the ICU. His hemoglobin has fallen from 11.2-9.8. He was started on a Protonix drip empirically in the ED, will continue for now. We'll transfuse him 2 units of packed red blood cells and follow his CBC every 6 hours 36-48 hours. GI has been consulted. 2. Acute blood loss anemia. As above, will transfuse him 2 units of packed red blood cells in the setting of heart disease.. Will keep 4 units ahead. 3. Nonischemic cardiomyopathy/chronic systolic heart failure/EF 15%/ICD in situ. Patient appears to be compensated. He is treated with multiple cardiac medications including Lasix, isosorbide dinitrate, losartan, carvedilol, spironolactone, and aspirin. These medications are being held for now. Losartan and carvedilol are scheduled to be started tomorrow, but if his blood pressures still soft, would recommend holding them. He will be given 20 mg of Lasix between units of packed red blood cells. Will favor starting some of these medications tomorrow if his blood pressure is stable and following GI evaluation. Of note, will start gentle IV fluids cautiously due to his large volume bloody stools. 4. Essential hypertension. Patient is treated with medications as discussed in #3. His blood pressure is on the low end of normal, so we'll hold antihypertensive medications until tomorrow. 5. Nocturnal hypoxia. We'll continue nasal cannula oxygen. 6. Stage III to stage IV chronic kidney disease. The patient's baseline creatinine per chart review is 1.6-1.8. It appears to be at baseline. 7. Prediabetes. Patient's blood glucose is 110. We'll follow and treat with NovoLog as needed.    Code Status: Full code DVT Prophylaxis: SCDs Family Communication: Discussed with wife Disposition Plan: Discharge when clinically appropriate, likely in a few  days.  Time spent: ICU/critical care time 1 hour and 10 minutes.  Robynne Roat [facility] Pager [phone]

## 2014-10-28 NOTE — ED Notes (Signed)
Attempted report 

## 2014-10-28 NOTE — ED Notes (Signed)
Pt is alert and oriented. Wife at bedside. VSS. Received pt report from [place], Therapist, sports.

## 2014-10-28 NOTE — Op Note (Signed)
EGD PROCEDURE REPORT  PATIENT:  [name] Macias  MR#:  [id number] Birthdate:  August 20, 1932, 81 y.o., male Endoscopist:  Dr. Rogene Houston, MD Referred By:  Dr. Rexene Alberts, MD  Procedure Date: 10/28/2014  Procedure:   EGD  Indications:  Patient is 81 year old African-American male who presents with acute GI bleed. He's been experiencing epigastric pain reliever times. He's had this pain for 5 days since he lost his brother. He is undergoing diagnostic EGD with therapeutic intention.            Informed Consent:  The risks, benefits, alternatives & imponderables which include, but are not limited to, bleeding, infection, perforation, drug reaction and potential missed lesion have been reviewed.  The potential for biopsy, lesion removal, esophageal dilation, etc. have also been discussed.  Questions have been answered.  All parties agreeable.  Please see history & physical in medical record for more information.  Medications:  Demerol 20 mg IV Versed 2 mg IV Cetacaine spray topically for oropharyngeal anesthesia  Description of procedure:  The endoscope was introduced through the mouth and advanced to the second portion of the duodenum without difficulty or limitations. The mucosal surfaces were surveyed very carefully during advancement of the scope and upon withdrawal.  Findings:  Esophagus: Mucosa of the esophagus was normal. GE junction was unremarkable. GEJ:  44 cm Hiatus:  46 cm Stomach:  Stomach was empty and distended very well with insufflation. Folds in the proximal stomach were normal. Examination mucosa at gastric body, antrum, pyloric channel, angularis, fundus and cardia was normal. Duodenum:  Normal bar and post bulbar mucosa.  Therapeutic/Diagnostic Maneuvers Performed:  None  Complications:  None  EBL: None  Impression: Small sliding hiatal hernia otherwise normal EGD.  Recommendations:  Will proceed with colonoscopy tomorrow morning.  REHMAN,NAJEEB U  10/28/2014   6:05 PM  CC: Dr. Alonza Bogus, MD & Dr. Rayne Du ref. provider found

## 2014-10-28 NOTE — ED Notes (Signed)
Called to room- pt had been co increase in abdo discomfort and then radiating up into chest -- pt daiphoretic and vomited small amount-    MD informed and into room at this time to see pt -

## 2014-10-28 NOTE — ED Provider Notes (Signed)
CSN: [id number]     Arrival date & time 10/28/14  6803 History   First MD Initiated Contact with Patient 10/28/14 0348     Chief Complaint  Patient presents with  . GI Bleeding     (Consider location/radiation/quality/duration/timing/severity/associated sxs/prior Treatment) Patient is a 81 y.o. male presenting with general illness.  Illness Location:  Hematochezia Quality:  Bright red blood with some clots Severity:  Moderate Onset quality:  Sudden Duration:  1 hour Timing:  Intermittent Chronicity:  New Context:  While sleeping Relieved by:  None tried Worsened by:  None tried Ineffective treatments:  None tried Associated symptoms: abdominal pain, diarrhea and nausea   Associated symptoms: no chest pain, no cough, no fever, no shortness of breath and no vomiting     Past Medical History  Diagnosis Date  . Arthritis   . Gout   . Non-ischemic cardiomyopathy ([place])   . Hypertension   . Arthritis     left foot   . CAD (coronary artery disease) of artery bypass graft   . COPD (chronic obstructive pulmonary disease) ([place])   . History of syphilis   . Type 2 HSV infection of penis   . Erectile dysfunction   . Anxiety disorder   . Dementia   . Anxiety   . Angina   . GERD (gastroesophageal reflux disease)   . Ascending aortic aneurysm ([place])   . Lung nodule     RUL nodule noted on abdominal CT   Past Surgical History  Procedure Laterality Date  . Left ankle      Not Done by Dr. Aline Brochure   . S/p  biventricular pacemaker / aicd implantation  02/19/05  . Defribrillator implant    . Crtd upgrade  12/14/2009  . Transthoracic echocardiogram  2011  . Doppler echocardiography  2009  . Insert / replace / remove pacemaker    . Esophagogastroduodenoscopy N/A 07/01/2013    Procedure: ESOPHAGOGASTRODUODENOSCOPY (EGD);  Surgeon: Rogene Houston, MD;  Location: [facility];  Service: Endoscopy;  Laterality: N/A;  145-moved to 1245 Ann notfied pt   Family History  Problem Relation  Age of Onset  . Heart failure Father   . Prostate cancer Brother   . Diabetes Brother   . Hypertension Brother   . Heart failure Brother    Social History  Substance Use Topics  . Smoking status: Former Smoker -- 1.00 packs/day for 10 years    Types: Cigarettes    Quit date: 01/15/1963  . Smokeless tobacco: Never Used  . Alcohol Use: No    Review of Systems  Constitutional: Negative for fever.  Eyes: Negative for pain.  Respiratory: Negative for cough, shortness of breath and stridor.   Cardiovascular: Negative for chest pain.  Gastrointestinal: Positive for nausea, abdominal pain and diarrhea. Negative for vomiting, abdominal distention and anal bleeding.  Endocrine: Negative for polydipsia and polyuria.  All other systems reviewed and are negative.     Allergies  Codeine; Enalapril maleate; and Sulfonamide derivatives  Home Medications   Prior to Admission medications   Medication Sig Start Date End Date Taking? Authorizing Provider  acetaminophen (TYLENOL) 500 MG tablet Take 1,000 mg by mouth daily as needed for moderate pain.     Historical Provider, MD  allopurinol (ZYLOPRIM) 100 MG tablet Take 1 tablet (100 mg total) by mouth daily. 11/03/13   Fayrene Helper, MD  Ascorbic Acid (VITAMIN C PO) Take 1 tablet by mouth daily.    Historical Provider, MD  aspirin (ASPIRIN LOW DOSE) 81 MG EC tablet Take 81 mg by mouth daily.      Historical Provider, MD  carvedilol (COREG) 25 MG tablet Take 1 tablet (25 mg total) by mouth 2 (two) times daily with a meal. 11/03/13   Fayrene Helper, MD  Cholecalciferol (VITAMIN D PO) Take 2 tablets by mouth daily.    Historical Provider, MD  furosemide (LASIX) 40 MG tablet Take 1 tablet (40 mg total) by mouth 2 (two) times daily. 11/03/13   Fayrene Helper, MD  HYDROcodone-acetaminophen (NORCO) 5-325 MG per tablet Take 1-2 tablets by mouth every 4 (four) hours as needed. 05/17/14   Elnora Morrison, MD  HYDROcodone-acetaminophen (NORCO)  5-325 MG per tablet Take 2 tablets by mouth every 4 (four) hours as needed. 05/17/14   Elnora Morrison, MD  isosorbide dinitrate (ISORDIL) 5 MG tablet Take 5 mg by mouth 2 (two) times daily.    Historical Provider, MD  losartan (COZAAR) 25 MG tablet Take 1 tablet (25 mg total) by mouth daily. 11/03/13   Fayrene Helper, MD  mirtazapine (REMERON) 15 MG tablet TAKE 1 TABLET BY MOUTH AT BEDTIME 06/17/14   Fayrene Helper, MD  nitroGLYCERIN (NITROSTAT) 0.4 MG SL tablet Place 1 tablet (0.4 mg total) under the tongue every 5 (five) minutes as needed for chest pain. For chest pain 03/18/14   Evans Lance, MD  NON FORMULARY 2 mLs. Oxygen. Daily at night and PRN during the day.     Historical Provider, MD  omeprazole (PRILOSEC) 20 MG capsule Take 1 capsule (20 mg total) by mouth daily. Patient taking differently: Take 20 mg by mouth 2 (two) times daily before a meal.  10/11/13   Butch Penny, NP  ondansetron (ZOFRAN ODT) 4 MG disintegrating tablet Take 1 tablet (4 mg total) by mouth every 8 (eight) hours as needed for nausea or vomiting. 05/10/14   Kristen N Ward, DO  promethazine (PHENERGAN) 25 MG tablet Take 1 tablet (25 mg total) by mouth every 6 (six) hours as needed. 04/22/14   [name], MD  sertraline (ZOLOFT) 50 MG tablet Take 50 mg by mouth daily. 04/11/14   Historical Provider, MD  spironolactone (ALDACTONE) 25 MG tablet Take 1 tablet (25 mg total) by mouth daily. 11/03/13   Fayrene Helper, MD  temazepam (RESTORIL) 30 MG capsule Take 1 capsule (30 mg total) by mouth at bedtime as needed for sleep. 11/29/13 05/30/14  Fayrene Helper, MD   BP 105/68 mmHg  Pulse 75  Resp 20  SpO2 94% Physical Exam  Constitutional: He is oriented to person, place, and time. He appears well-developed and well-nourished.  HENT:  Head: Normocephalic and atraumatic.  Eyes: Conjunctivae and EOM are normal.  Neck: Normal range of motion. Neck supple.  Cardiovascular: Normal rate and regular rhythm.    Pulmonary/Chest: Effort normal. No respiratory distress.  Abdominal: Soft. He exhibits no distension. There is tenderness. There is no rebound and no guarding.  Musculoskeletal: Normal range of motion. He exhibits no edema or tenderness.  Neurological: He is alert and oriented to person, place, and time.  Skin: Skin is warm and dry.  Nursing note and vitals reviewed.   ED Course  Procedures (including critical care time) Labs Review Labs Reviewed  CBC WITH DIFFERENTIAL/PLATELET - Abnormal; Notable for the following:    RBC 3.67 (*)    Hemoglobin 11.2 (*)    HCT 34.0 (*)    All other components within normal limits  COMPREHENSIVE METABOLIC PANEL - Abnormal; Notable for the following:    Sodium 134 (*)    Potassium 3.3 (*)    Glucose, Bld 110 (*)    BUN 31 (*)    Creatinine, Ser 1.79 (*)    ALT 10 (*)    GFR calc non Af Amer 34 (*)    GFR calc Af Amer 40 (*)    All other components within normal limits  POC OCCULT BLOOD, ED - Abnormal; Notable for the following:    Fecal Occult Bld POSITIVE (*)    All other components within normal limits  LACTIC ACID, PLASMA  LIPASE, BLOOD  LACTIC ACID, PLASMA  CBC  CBC  CBC  I-STAT TROPOININ, ED  TYPE AND SCREEN  TYPE AND SCREEN    Imaging Review Ct Abdomen Pelvis W Contrast  10/28/2014  CLINICAL DATA:  Low abdominal pain with large amounts of bright red blood in stools. EXAM: CT ABDOMEN AND PELVIS WITH CONTRAST TECHNIQUE: Multidetector CT imaging of the abdomen and pelvis was performed using the standard protocol following bolus administration of intravenous contrast. CONTRAST:  56mL OMNIPAQUE IOHEXOL 300 MG/ML SOLN, 75mL OMNIPAQUE IOHEXOL 300 MG/ML SOLN COMPARISON:  05/17/2014 FINDINGS: Atelectasis in the lung bases.  Diffuse cardiac enlargement. The liver, spleen, gallbladder, pancreas, adrenal glands, inferior vena cava, and retroperitoneal lymph nodes are unremarkable. Multiple bilateral renal cysts. Largest on the left measures 3.8  cm diameter. No hydronephrosis in either kidney. Nephrograms appear symmetrical. Calcification of the abdominal aorta and branch vessels with ectatic iliac arteries. Calcific atherosclerotic changes in the celiac axis and superior mesenteric artery although the vessels appear patent. Focal stenosis not excluded. Stomach, small bowel, and colon are not abnormally distended. There is a small umbilical hernia containing small bowel but without proximal obstruction. Diffusely stool-filled colon with scattered diverticula. No free air or free fluid in the abdomen. Pelvis: Prostate gland is mildly enlarged. Bladder wall is not thickened. No free or loculated pelvic fluid collections. No pelvic mass or lymphadenopathy. Appendix is normal. Small inguinal hernias containing fat. Degenerative changes throughout the spine. No destructive bone lesions. IMPRESSION: Small umbilical hernia containing small bowel. No bowel obstruction or inflammation. Diverticulosis throughout the colon without inflammatory change. Bilateral fat containing inguinal hernias. No specific abnormality demonstrated to explain history of rectal bleeding. Electronically Signed   By: Lucienne Capers M.D.   On: 10/28/2014 06:01   I have personally reviewed and evaluated these images and lab results as part of my medical decision-making.   EKG Interpretation None      MDM   Final diagnoses:  Hematochezia  Diverticulosis of intestine with bleeding, unspecified intestinal tract location   81 yo M w/ large bloody bowel movement in the bed today prior to arrival. A couple days of abdominal pain prior to that. Exam here with some distension but no ttp or peritonitis. VS normal. Initial plan for labs and reevaluation however patient with at least 3 more large bright red blood/clot in BM's so CT scan done as well. Repeat abdominal exam unchanged. VSS. Type/screen, protonix all done. CT with diverticulosis only, no other causes of symptoms. VSS will  admit to medicine for GI consult, started on protonix infusion.  The patient appears reasonably stabilized for admission considering the current resources, flow, and capabilities available in the ED at this time, and I doubt any other [facility] requiring further screening and/or treatment in the ED prior to admission.     Merrily Pew, MD 10/28/14 0700

## 2014-10-28 NOTE — ED Notes (Signed)
Pt taken off bed pan-- large amount of bright red/dark

## 2014-10-28 NOTE — Progress Notes (Signed)
Upper Endoscopy done a bedside

## 2014-10-28 NOTE — ED Notes (Signed)
Phoned [facility] re protonix order. Pharmacy should be arriving soon, will notify pharmacy by note.

## 2014-10-29 ENCOUNTER — Encounter (HOSPITAL_COMMUNITY)
Admission: EM | Disposition: A | Discharge status: Home Health | Payer: Self-pay | Source: Home / Self Care | Attending: Pulmonary Disease

## 2014-10-29 ENCOUNTER — Encounter (HOSPITAL_COMMUNITY): Payer: Self-pay | Admitting: *Deleted

## 2014-10-29 DIAGNOSIS — K5730 Diverticulosis of large intestine without perforation or abscess without bleeding: Secondary | ICD-10-CM

## 2014-10-29 HISTORY — PX: COLONOSCOPY: SHX5424

## 2014-10-29 LAB — COMPREHENSIVE METABOLIC PANEL
ALBUMIN: 3.1 g/dL — AB (ref 3.5–5.0)
ALK PHOS: 74 U/L (ref 38–126)
ALT: 11 U/L — AB (ref 17–63)
AST: 19 U/L (ref 15–41)
Anion gap: 5 (ref 5–15)
BILIRUBIN TOTAL: 1.2 mg/dL (ref 0.3–1.2)
BUN: 28 mg/dL — AB (ref 6–20)
CO2: 24 mmol/L (ref 22–32)
CREATININE: 1.35 mg/dL — AB (ref 0.61–1.24)
Calcium: 8.8 mg/dL — ABNORMAL LOW (ref 8.9–10.3)
Chloride: 113 mmol/L — ABNORMAL HIGH (ref 101–111)
GFR calc Af Amer: 56 mL/min — ABNORMAL LOW (ref >60)
GFR calc non Af Amer: 48 mL/min — ABNORMAL LOW (ref >60)
GLUCOSE: 91 mg/dL (ref 65–99)
Potassium: 4.1 mmol/L (ref 3.5–5.1)
Sodium: 142 mmol/L (ref 135–145)
TOTAL PROTEIN: 5.8 g/dL — AB (ref 6.5–8.1)

## 2014-10-29 LAB — CBC
HEMATOCRIT: 32.4 % — AB (ref 39.0–52.0)
HEMOGLOBIN: 10.6 g/dL — AB (ref 13.0–17.0)
MCH: 29.9 pg (ref 26.0–34.0)
MCHC: 32.7 g/dL (ref 30.0–36.0)
MCV: 91.5 fL (ref 78.0–100.0)
Platelets: 169 10*3/uL (ref 150–400)
RBC: 3.54 MIL/uL — ABNORMAL LOW (ref 4.22–5.81)
RDW: 15.4 % (ref 11.5–15.5)
WBC: 9 10*3/uL (ref 4.0–10.5)

## 2014-10-29 LAB — HEMOGLOBIN A1C
HEMOGLOBIN A1C: 6.1 % — AB (ref 4.8–5.6)
MEAN PLASMA GLUCOSE: 128 mg/dL

## 2014-10-29 SURGERY — COLONOSCOPY
Anesthesia: Moderate Sedation

## 2014-10-29 MED ORDER — MEPERIDINE HCL 50 MG/ML IJ SOLN
INTRAMUSCULAR | Status: AC
  Filled 2014-10-29: qty 1

## 2014-10-29 MED ORDER — HYDROCODONE-HOMATROPINE 5-1.5 MG/5ML PO SYRP
5.0000 mL | ORAL_SOLUTION | ORAL | Status: DC | PRN
  Administered 2014-10-29: 5 mL via ORAL
  Filled 2014-10-29: qty 5

## 2014-10-29 MED ORDER — SODIUM CHLORIDE 0.9 % IV SOLN: INTRAVENOUS | Status: DC

## 2014-10-29 MED ORDER — MIDAZOLAM HCL 5 MG/5ML IJ SOLN
INTRAMUSCULAR | Status: AC
  Filled 2014-10-29: qty 10

## 2014-10-29 MED ORDER — PEG 3350-KCL-NA BICARB-NACL 420 G PO SOLR
ORAL | Status: AC
  Filled 2014-10-29: qty 4000

## 2014-10-29 MED ORDER — PANTOPRAZOLE SODIUM 40 MG PO TBEC
40.0000 mg | DELAYED_RELEASE_TABLET | Freq: Every day | ORAL | Status: DC
  Administered 2014-10-29 – 2014-10-31 (×3): 40 mg via ORAL
  Filled 2014-10-29 (×3): qty 1

## 2014-10-29 NOTE — Progress Notes (Signed)
OOB to bedside commode liquid brownish stool noted.

## 2014-10-29 NOTE — Progress Notes (Signed)
Subjective: H was admitted with GI bleeding. No definite source on EGD or colonoscopy.He otherwise feels ok. No chest pain or overt symptoms of CHF.  Objective: Vital signs in last 24 hours: Temp:  [97.5 F (36.4 C)-97.9 F (36.6 C)] 97.8 F (36.6 C) (10/15 0300) Pulse Rate:  [28-102] 92 (10/15 1200) Resp:  [13-35] 24 (10/15 1200) BP: (73-136)/(42-96) 123/65 mmHg (10/15 1200) SpO2:  [94 %-100 %] 94 % (10/15 1200) Weight:  [72.6 kg (160 lb 0.9 oz)] 72.6 kg (160 lb 0.9 oz) (10/15 0300) Weight change:  Last BM Date: 10/29/14  Intake/Output from previous day: 10/14 0701 - 10/15 0700 In: 2915.6 [I.V.:1945.6; Blood:670; IV Piggyback:300] Out: 1529 [Urine:1525; Stool:4]  PHYSICAL EXAM General appearance: alert, cooperative and no distress Resp: clear to auscultation bilaterally Cardio: regular rate and rhythm, S1, S2 normal, no murmur, click, rub or gallop GI: soft, non-tender; bowel sounds normal; no masses,  no organomegaly Extremities: extremities normal, atraumatic, no cyanosis or edema  Lab Results:  Results for orders placed or performed during the hospital encounter of 10/28/14 (from the past 48 hour(s))  POC occult blood, ED     Status: Abnormal   Collection Time: 10/28/14  3:49 AM  Result Value Ref Range   Fecal Occult Bld POSITIVE (A) NEGATIVE  CBC with Differential     Status: Abnormal   Collection Time: 10/28/14  4:00 AM  Result Value Ref Range   WBC 6.6 4.0 - 10.5 K/uL   RBC 3.67 (L) 4.22 - 5.81 MIL/uL   Hemoglobin 11.2 (L) 13.0 - 17.0 g/dL   HCT 34.0 (L) 39.0 - 52.0 %   MCV 92.6 78.0 - 100.0 fL   MCH 30.5 26.0 - 34.0 pg   MCHC 32.9 30.0 - 36.0 g/dL   RDW 14.4 11.5 - 15.5 %   Platelets 209 150 - 400 K/uL   Neutrophils Relative % 63 %   Neutro Abs 4.2 1.7 - 7.7 K/uL   Lymphocytes Relative 24 %   Lymphs Abs 1.6 0.7 - 4.0 K/uL   Monocytes Relative 9 %   Monocytes Absolute 0.6 0.1 - 1.0 K/uL   Eosinophils Relative 3 %   Eosinophils Absolute 0.2 0.0 - 0.7 K/uL    Basophils Relative 1 %   Basophils Absolute 0.0 0.0 - 0.1 K/uL  Comprehensive metabolic panel     Status: Abnormal   Collection Time: 10/28/14  4:00 AM  Result Value Ref Range   Sodium 134 (L) 135 - 145 mmol/L   Potassium 3.3 (L) 3.5 - 5.1 mmol/L   Chloride 104 101 - 111 mmol/L   CO2 24 22 - 32 mmol/L   Glucose, Bld 110 (H) 65 - 99 mg/dL   BUN 31 (H) 6 - 20 mg/dL   Creatinine, Ser 1.79 (H) 0.61 - 1.24 mg/dL   Calcium 9.0 8.9 - 10.3 mg/dL   Total Protein 6.8 6.5 - 8.1 g/dL   Albumin 3.6 3.5 - 5.0 g/dL   AST 18 15 - 41 U/L   ALT 10 (L) 17 - 63 U/L   Alkaline Phosphatase 95 38 - 126 U/L   Total Bilirubin 0.6 0.3 - 1.2 mg/dL   GFR calc non Af Amer 34 (L) >60 mL/min   GFR calc Af Amer 40 (L) >60 mL/min    Comment: (NOTE) The eGFR has been calculated using the CKD EPI equation. This calculation has not been validated in all clinical situations. eGFR's persistently <60 mL/min signify possible Chronic Kidney Disease.    Anion  gap 6 5 - 15  Lactic acid, plasma     Status: None   Collection Time: 10/28/14  4:00 AM  Result Value Ref Range   Lactic Acid, Venous 0.9 0.5 - 2.0 mmol/L  Type and screen     Status: None (Preliminary result)   Collection Time: 10/28/14  4:00 AM  Result Value Ref Range   ABO/RH(D) O POS    Antibody Screen NEG    Sample Expiration 10/31/2014    Unit Number [id number]    Blood Component Type RED CELLS,LR    Unit division 00    Status of Unit ISSUED,FINAL    Transfusion Status OK TO TRANSFUSE    Crossmatch Result Compatible    Unit Number [id number]    Blood Component Type RED CELLS,LR    Unit division 00    Status of Unit ISSUED,FINAL    Transfusion Status OK TO TRANSFUSE    Crossmatch Result Compatible    Unit Number [id number]    Blood Component Type RED CELLS,LR    Unit division 00    Status of Unit ALLOCATED    Transfusion Status OK TO TRANSFUSE    Crossmatch Result Compatible    Unit Number [id number]    Blood Component Type  RED CELLS,LR    Unit division 00    Status of Unit ALLOCATED    Transfusion Status OK TO TRANSFUSE    Crossmatch Result Compatible   Lipase, blood     Status: None   Collection Time: 10/28/14  4:00 AM  Result Value Ref Range   Lipase 27 22 - 51 U/L  Prepare RBC     Status: None   Collection Time: 10/28/14  4:00 AM  Result Value Ref Range   Order Confirmation ORDER PROCESSED BY BLOOD BANK   I-stat troponin, ED     Status: None   Collection Time: 10/28/14  5:31 AM  Result Value Ref Range   Troponin i, poc 0.06 0.00 - 0.08 ng/mL   Comment 3            Comment: Due to the release kinetics of cTnI, a negative result within the first hours of the onset of symptoms does not rule out myocardial infarction with certainty. If myocardial infarction is still suspected, repeat the test at appropriate intervals.   Lactic acid, plasma     Status: None   Collection Time: 10/28/14  7:07 AM  Result Value Ref Range   Lactic Acid, Venous 0.9 0.5 - 2.0 mmol/L  CBC     Status: Abnormal   Collection Time: 10/28/14  7:07 AM  Result Value Ref Range   WBC 6.5 4.0 - 10.5 K/uL   RBC 3.19 (L) 4.22 - 5.81 MIL/uL   Hemoglobin 9.8 (L) 13.0 - 17.0 g/dL   HCT 29.8 (L) 39.0 - 52.0 %   MCV 93.4 78.0 - 100.0 fL   MCH 30.7 26.0 - 34.0 pg   MCHC 32.9 30.0 - 36.0 g/dL   RDW 14.4 11.5 - 15.5 %   Platelets 187 150 - 400 K/uL  ABO/Rh     Status: None   Collection Time: 10/28/14  9:00 AM  Result Value Ref Range   ABO/RH(D) O POS   Protime-INR     Status: Abnormal   Collection Time: 10/28/14  9:32 AM  Result Value Ref Range   Prothrombin Time 16.6 (H) 11.6 - 15.2 seconds   INR 1.33 0.00 - 1.49  TSH     Status: None  Collection Time: 10/28/14  9:32 AM  Result Value Ref Range   TSH 1.611 0.350 - 4.500 uIU/mL  Hemoglobin A1c     Status: Abnormal   Collection Time: 10/28/14  9:32 AM  Result Value Ref Range   Hgb A1c MFr Bld 6.1 (H) 4.8 - 5.6 %    Comment: (NOTE)         Pre-diabetes: 5.7 - 6.4          Diabetes: >6.4         Glycemic control for adults with diabetes: <7.0    Mean Plasma Glucose 128 mg/dL    Comment: (NOTE) Performed At: [facility] [place], [place] [id number] Lindon Romp MD HR:[id number]   MRSA PCR Screening     Status: None   Collection Time: 10/28/14 11:31 AM  Result Value Ref Range   MRSA by PCR NEGATIVE NEGATIVE    Comment:        The GeneXpert MRSA Assay (FDA approved for NASAL specimens only), is one component of a comprehensive MRSA colonization surveillance program. It is not intended to diagnose MRSA infection nor to guide or monitor treatment for MRSA infections.   Prepare RBC     Status: None   Collection Time: 10/28/14 12:30 PM  Result Value Ref Range   Order Confirmation ORDER PROCESSED BY BLOOD BANK   CBC     Status: Abnormal   Collection Time: 10/28/14  6:31 PM  Result Value Ref Range   WBC 8.2 4.0 - 10.5 K/uL   RBC 3.76 (L) 4.22 - 5.81 MIL/uL   Hemoglobin 11.3 (L) 13.0 - 17.0 g/dL   HCT 34.2 (L) 39.0 - 52.0 %   MCV 91.0 78.0 - 100.0 fL   MCH 30.1 26.0 - 34.0 pg   MCHC 33.0 30.0 - 36.0 g/dL   RDW 15.4 11.5 - 15.5 %   Platelets 170 150 - 400 K/uL  CBC     Status: Abnormal   Collection Time: 10/28/14 10:28 PM  Result Value Ref Range   WBC 8.6 4.0 - 10.5 K/uL   RBC 3.56 (L) 4.22 - 5.81 MIL/uL   Hemoglobin 10.6 (L) 13.0 - 17.0 g/dL   HCT 32.3 (L) 39.0 - 52.0 %   MCV 90.7 78.0 - 100.0 fL   MCH 29.8 26.0 - 34.0 pg   MCHC 32.8 30.0 - 36.0 g/dL   RDW 15.6 (H) 11.5 - 15.5 %   Platelets 163 150 - 400 K/uL  CBC     Status: Abnormal   Collection Time: 10/29/14  4:49 AM  Result Value Ref Range   WBC 9.0 4.0 - 10.5 K/uL   RBC 3.54 (L) 4.22 - 5.81 MIL/uL   Hemoglobin 10.6 (L) 13.0 - 17.0 g/dL   HCT 32.4 (L) 39.0 - 52.0 %   MCV 91.5 78.0 - 100.0 fL   MCH 29.9 26.0 - 34.0 pg   MCHC 32.7 30.0 - 36.0 g/dL   RDW 15.4 11.5 - 15.5 %   Platelets 169 150 - 400 K/uL  Comprehensive metabolic panel     Status:  Abnormal   Collection Time: 10/29/14  4:49 AM  Result Value Ref Range   Sodium 142 135 - 145 mmol/L    Comment: DELTA CHECK NOTED   Potassium 4.1 3.5 - 5.1 mmol/L    Comment: DELTA CHECK NOTED   Chloride 113 (H) 101 - 111 mmol/L   CO2 24 22 - 32 mmol/L   Glucose, Bld 91 65 -  99 mg/dL   BUN 28 (H) 6 - 20 mg/dL   Creatinine, Ser 1.35 (H) 0.61 - 1.24 mg/dL   Calcium 8.8 (L) 8.9 - 10.3 mg/dL   Total Protein 5.8 (L) 6.5 - 8.1 g/dL   Albumin 3.1 (L) 3.5 - 5.0 g/dL   AST 19 15 - 41 U/L   ALT 11 (L) 17 - 63 U/L   Alkaline Phosphatase 74 38 - 126 U/L   Total Bilirubin 1.2 0.3 - 1.2 mg/dL   GFR calc non Af Amer 48 (L) >60 mL/min   GFR calc Af Amer 56 (L) >60 mL/min    Comment: (NOTE) The eGFR has been calculated using the CKD EPI equation. This calculation has not been validated in all clinical situations. eGFR's persistently <60 mL/min signify possible Chronic Kidney Disease.    Anion gap 5 5 - 15    ABGS No results for input(s): PHART, PO2ART, TCO2, HCO3 in the last 72 hours.  Invalid input(s): PCO2 CULTURES Recent Results (from the past 240 hour(s))  MRSA PCR Screening     Status: None   Collection Time: 10/28/14 11:31 AM  Result Value Ref Range Status   MRSA by PCR NEGATIVE NEGATIVE Final    Comment:        The GeneXpert MRSA Assay (FDA approved for NASAL specimens only), is one component of a comprehensive MRSA colonization surveillance program. It is not intended to diagnose MRSA infection nor to guide or monitor treatment for MRSA infections.    Studies/Results: Ct Abdomen Pelvis W Contrast  10/28/2014  CLINICAL DATA:  Low abdominal pain with large amounts of bright red blood in stools. EXAM: CT ABDOMEN AND PELVIS WITH CONTRAST TECHNIQUE: Multidetector CT imaging of the abdomen and pelvis was performed using the standard protocol following bolus administration of intravenous contrast. CONTRAST:  70m OMNIPAQUE IOHEXOL 300 MG/ML SOLN, 729mOMNIPAQUE IOHEXOL 300  MG/ML SOLN COMPARISON:  05/17/2014 FINDINGS: Atelectasis in the lung bases.  Diffuse cardiac enlargement. The liver, spleen, gallbladder, pancreas, adrenal glands, inferior vena cava, and retroperitoneal lymph nodes are unremarkable. Multiple bilateral renal cysts. Largest on the left measures 3.8 cm diameter. No hydronephrosis in either kidney. Nephrograms appear symmetrical. Calcification of the abdominal aorta and branch vessels with ectatic iliac arteries. Calcific atherosclerotic changes in the celiac axis and superior mesenteric artery although the vessels appear patent. Focal stenosis not excluded. Stomach, small bowel, and colon are not abnormally distended. There is a small umbilical hernia containing small bowel but without proximal obstruction. Diffusely stool-filled colon with scattered diverticula. No free air or free fluid in the abdomen. Pelvis: Prostate gland is mildly enlarged. Bladder wall is not thickened. No free or loculated pelvic fluid collections. No pelvic mass or lymphadenopathy. Appendix is normal. Small inguinal hernias containing fat. Degenerative changes throughout the spine. No destructive bone lesions. IMPRESSION: Small umbilical hernia containing small bowel. No bowel obstruction or inflammation. Diverticulosis throughout the colon without inflammatory change. Bilateral fat containing inguinal hernias. No specific abnormality demonstrated to explain history of rectal bleeding. Electronically Signed   By: WiLucienne Capers.D.   On: 10/28/2014 06:01    Medications:  Prior to Admission:  Prescriptions prior to admission  Medication Sig Dispense Refill Last Dose  . acetaminophen (TYLENOL) 500 MG tablet Take 1,000 mg by mouth daily as needed for moderate pain.    Past Month at Unknown time  . allopurinol (ZYLOPRIM) 100 MG tablet Take 1 tablet (100 mg total) by mouth daily. 90 tablet 1 10/27/2014 at Unknown  time  . Ascorbic Acid (VITAMIN C PO) Take 1 tablet by mouth daily.    10/27/2014 at Unknown time  . aspirin EC 81 MG tablet Take 81 mg by mouth every evening.   10/27/2014 at Unknown time  . carvedilol (COREG) 25 MG tablet Take 1 tablet (25 mg total) by mouth 2 (two) times daily with a meal. 180 tablet 1 10/27/2014 at 1800  . Cholecalciferol (VITAMIN D PO) Take 2 tablets by mouth daily.   10/27/2014 at Unknown time  . furosemide (LASIX) 40 MG tablet Take 1 tablet (40 mg total) by mouth 2 (two) times daily. 180 tablet 1 10/27/2014 at 1600  . HYDROcodone-acetaminophen (NORCO) 5-325 MG per tablet Take 2 tablets by mouth every 4 (four) hours as needed. 6 tablet 0 Past Week at Unknown time  . isosorbide dinitrate (ISORDIL) 5 MG tablet Take 5 mg by mouth 2 (two) times daily.   10/27/2014 at 1800  . losartan (COZAAR) 25 MG tablet Take 1 tablet (25 mg total) by mouth daily. 90 tablet 1 10/27/2014 at Unknown time  . nitroGLYCERIN (NITROSTAT) 0.4 MG SL tablet Place 1 tablet (0.4 mg total) under the tongue every 5 (five) minutes as needed for chest pain. For chest pain 30 tablet 6 Past Week at Unknown time  . omeprazole (PRILOSEC) 20 MG capsule Take 1 capsule (20 mg total) by mouth daily. (Patient taking differently: Take 20 mg by mouth 2 (two) times daily before a meal. ) 30 capsule 3 10/27/2014 at Unknown time  . ondansetron (ZOFRAN ODT) 4 MG disintegrating tablet Take 1 tablet (4 mg total) by mouth every 8 (eight) hours as needed for nausea or vomiting. (Patient taking differently: Take 4 mg by mouth 2 (two) times daily. ) 20 tablet 0 10/27/2014 at Unknown time  . OXYGEN Inhale 2 L into the lungs at bedtime. May use daily as needed   10/28/2014 at Unknown time  . sertraline (ZOLOFT) 50 MG tablet Take 50 mg by mouth daily.  5 10/27/2014 at Unknown time  . spironolactone (ALDACTONE) 25 MG tablet Take 1 tablet (25 mg total) by mouth daily. 90 tablet 1 10/27/2014 at Unknown time  . mirtazapine (REMERON) 15 MG tablet TAKE 1 TABLET BY MOUTH AT BEDTIME 30 tablet 2   . temazepam  (RESTORIL) 30 MG capsule Take 1 capsule (30 mg total) by mouth at bedtime as needed for sleep. 90 capsule 1 Taking   Scheduled: . allopurinol  100 mg Oral Daily  . antiseptic oral rinse  7 mL Mouth Rinse BID  . carvedilol  25 mg Oral BID WC  . isosorbide dinitrate  5 mg Oral BID  . losartan  25 mg Oral Daily  . meperidine      . midazolam      . mirtazapine  15 mg Oral QHS  . pantoprazole  40 mg Oral QPC breakfast  . sertraline  50 mg Oral Daily   Continuous: . sodium chloride    . sodium chloride    . 0.9 % NaCl with KCl 20 mEq / L 50 mL/hr (10/29/14 1143)   ZOX:WRUEAV chloride, acetaminophen **OR** acetaminophen, butamben-tetracaine-benzocaine, HYDROcodone-acetaminophen, HYDROmorphone (DILAUDID) injection, levalbuterol, meperidine, midazolam, nitroGLYCERIN, ondansetron **OR** ondansetron (ZOFRAN) IV, simethicone susp in sterile water 1000 mL irrigation  Assesment:he had GI bleeding which has now resolved. No source identified. He had acute blood loss anemia and received blood.  He has heart failure but is doing ok despite fluid and blood products.   Renal failure about the  samePrincipal Problem:   Hematochezia Active Problems:   Nocturnal hypoxia   Nonischemic cardiomyopathy (HCC)   Chronic systolic heart failure (HCC)   Automatic implantable cardioverter-defibrillator in situ   Prediabetes   Essential hypertension   Acute blood loss anemia   Stage III chronic kidney disease   GI (gastrointestinal bleed)   Generalized abdominal cramping    Plan:No change . Recheck blood in AM    LOS: 1 day   [place] Searles L 10/29/2014, 2:35 PM  2

## 2014-10-29 NOTE — Progress Notes (Signed)
Patient returned from Endoscopy alert and oriented. Wife present at bedside.

## 2014-10-29 NOTE — Op Note (Signed)
COLONOSCOPY PROCEDURE REPORT  PATIENT:  [name] Macias  MR#:  [id number] Birthdate:  10-Mar-1932, 81 y.o., male Endoscopist:  Dr. Rogene Houston, MD Referred By:  Dr. Rexene Alberts, MD Procedure Date: 10/29/2014  Procedure:   Colonoscopy  Indications:  Patient is a year-old African-American male who presents with acute GI bleed. He underwent EGD yesterday and no bleeding lesion was found. He has received 2 units of PRBCs and is hemodynamically stable.  Informed Consent:  The procedure and risks were reviewed with the patient and informed consent was obtained.  Medications:  Demerol 30 mg IV Versed 3 mg IV  Description of procedure:  After a digital rectal exam was performed, that colonoscope was advanced from the anus through the rectum and colon to the area of the cecum, ileocecal valve and appendiceal orifice. The cecum was deeply intubated. These structures were well-seen and photographed for the record. From the level of the cecum and ileocecal valve, the scope was slowly and cautiously withdrawn. The mucosal surfaces were carefully surveyed utilizing scope tip to flexion to facilitate fold flattening as needed. The scope was pulled down into the rectum where a thorough exam including retroflexion was performed.  Findings:   Blood noted throughout the colon. Bilious liquid trickling from ileocecal valve. 5 mm polyp at cecum. It was not removed because of poor approach. Numerous diverticula throughout the colon; most of the diverticula were located at hepatic flexure and sigmoid colon. Several of these diverticula would washed but none with active bleeding or ulceration. Normal rectal mucosa and anal rectal junction.   Therapeutic/Diagnostic Maneuvers Performed:   None  Complications:  None  EBL: None due to intervention.  Cecal Withdrawal Time:  50  minutes  Impression:  Examination performed to cecum. Pancolonic diverticulosis. Blood noted throughout the colon. Blood was  suctioned and washed away but none of the diverticula was actively bleeding.  Comment: Colonic diverticular bleed. Bleeding diverticulum not identified as bleeding has stopped.  Recommendations:  Full liquid diet. Change pantoprazole to oral route. If bleeding recurs will proceed with GI bleeding scan.   [name] Macias  10/29/2014 10:41 AM  CC: Dr. Alonza Bogus, MD & Dr. Rayne Du ref. provider found

## 2014-10-30 ENCOUNTER — Encounter (HOSPITAL_COMMUNITY)
Admission: EM | Disposition: A | Discharge status: Home Health | Payer: Self-pay | Source: Home / Self Care | Attending: Pulmonary Disease

## 2014-10-30 LAB — BASIC METABOLIC PANEL
Anion gap: 5 (ref 5–15)
BUN: 19 mg/dL (ref 6–20)
CALCIUM: 8.8 mg/dL — AB (ref 8.9–10.3)
CO2: 22 mmol/L (ref 22–32)
CREATININE: 1.28 mg/dL — AB (ref 0.61–1.24)
Chloride: 115 mmol/L — ABNORMAL HIGH (ref 101–111)
GFR calc non Af Amer: 51 mL/min — ABNORMAL LOW (ref >60)
GFR, EST AFRICAN AMERICAN: 59 mL/min — AB (ref >60)
Glucose, Bld: 108 mg/dL — ABNORMAL HIGH (ref 65–99)
Potassium: 3.8 mmol/L (ref 3.5–5.1)
Sodium: 142 mmol/L (ref 135–145)

## 2014-10-30 LAB — CBC WITH DIFFERENTIAL/PLATELET
BASOS PCT: 0 %
Basophils Absolute: 0 10*3/uL (ref 0.0–0.1)
EOS ABS: 0.3 10*3/uL (ref 0.0–0.7)
Eosinophils Relative: 4 %
HEMATOCRIT: 30.1 % — AB (ref 39.0–52.0)
Hemoglobin: 10 g/dL — ABNORMAL LOW (ref 13.0–17.0)
Lymphocytes Relative: 15 %
Lymphs Abs: 1.2 10*3/uL (ref 0.7–4.0)
MCH: 30.3 pg (ref 26.0–34.0)
MCHC: 33.2 g/dL (ref 30.0–36.0)
MCV: 91.2 fL (ref 78.0–100.0)
MONO ABS: 0.8 10*3/uL (ref 0.1–1.0)
MONOS PCT: 9 %
Neutro Abs: 6 10*3/uL (ref 1.7–7.7)
Neutrophils Relative %: 72 %
Platelets: 156 10*3/uL (ref 150–400)
RBC: 3.3 MIL/uL — ABNORMAL LOW (ref 4.22–5.81)
RDW: 15.7 % — AB (ref 11.5–15.5)
WBC: 8.3 10*3/uL (ref 4.0–10.5)

## 2014-10-30 SURGERY — COLONOSCOPY
Anesthesia: Moderate Sedation

## 2014-10-30 MED ORDER — LORAZEPAM 0.5 MG PO TABS: 0.5000 mg | ORAL_TABLET | Freq: Four times a day (QID) | ORAL | Status: DC | PRN

## 2014-10-30 MED ORDER — POTASSIUM CHLORIDE IN NACL 20-0.9 MEQ/L-% IV SOLN: INTRAVENOUS | Status: DC

## 2014-10-30 MED ORDER — DOXYCYCLINE HYCLATE 100 MG PO TABS
100.0000 mg | ORAL_TABLET | Freq: Two times a day (BID) | ORAL | Status: DC
  Filled 2014-10-30 (×2): qty 1

## 2014-10-30 MED ORDER — DOXYCYCLINE HYCLATE 100 MG PO TABS
100.0000 mg | ORAL_TABLET | Freq: Two times a day (BID) | ORAL | Status: DC
  Administered 2014-10-30 – 2014-10-31 (×3): 100 mg via ORAL
  Filled 2014-10-30 (×2): qty 1

## 2014-10-30 MED ORDER — DONEPEZIL HCL 5 MG PO TABS
5.0000 mg | ORAL_TABLET | Freq: Every day | ORAL | Status: DC
  Administered 2014-10-30: 5 mg via ORAL
  Filled 2014-10-30: qty 1

## 2014-10-30 MED ORDER — LORAZEPAM 0.5 MG PO TABS
0.5000 mg | ORAL_TABLET | Freq: Two times a day (BID) | ORAL | Status: DC | PRN
  Administered 2014-10-30: 0.5 mg via ORAL
  Filled 2014-10-30: qty 1

## 2014-10-30 NOTE — Progress Notes (Signed)
  Subjective:  Patient complains of feeling nervous for which Dr. Luan Pulling as ordered lorazepam. He has good appetite. He denies nausea vomiting melena or rectal bleeding. He is not having abdominal cramping anymore.  Objective: Blood pressure 131/87, pulse 104, temperature 98.2 F (36.8 C), temperature source Oral, resp. rate 24, height 5\' 8"  (1.727 m), weight 170 lb 3.1 oz (77.2 kg), SpO2 96 %. Patient is alert and in no acute distress. He is not confused and answers to questions appropriately. His wife is at bedside. Abdomen. Bowel sounds are normal. On palpation abdomen is soft and nontender without organomegaly or masses. No LE edema or clubbing noted.  Labs/studies Results:   Recent Labs  10/28/14 2228 10/29/14 0449 10/30/14 0444  WBC 8.6 9.0 8.3  HGB 10.6* 10.6* 10.0*  HCT 32.3* 32.4* 30.1*  PLT 163 169 156    BMET   Recent Labs  10/28/14 0400 10/29/14 0449 10/30/14 0444  NA 134* 142 142  K 3.3* 4.1 3.8  CL 104 113* 115*  CO2 24 24 22   GLUCOSE 110* 91 108*  BUN 31* 28* 19  CREATININE 1.79* 1.35* 1.28*  CALCIUM 9.0 8.8* 8.8*      Assessment:  #1. Colonic diverticular bleed. Patient underwent colonoscopy yesterday which time he has stopped bleeding. He had multiple colonic diverticula. Bleeding has stopped. He has not passed any blood per rectum since colonoscopy 24 hours ago and H&H is stable.  #2. Anemia. H&H is stable. Patient has received 2 units of PRBCs. #3. Anxiety and stress disorder secondary to his brother's passing away earlier this week and he was not able to attend his funeral. #4. History of nonischemic cardiomyopathy. Patient has AICD.   Recommendations:  Diet advanced a heart healthy diet. H&H in a.m. He should be able to go home tomorrow if H&H remains stable. Aspirin can be resumed in 2 weeks.

## 2014-10-30 NOTE — Progress Notes (Signed)
Patient transported to room 309 via wheelchair. Wife present.

## 2014-10-30 NOTE — Progress Notes (Signed)
Subjective: He says he feels better. He's been more anxious and agitated. He has not had any further bleeding. He says he wants to go home. His wife reports he is coughing a lot. He has also been confused and I think he probably has some element of dementia  Objective: Vital signs in last 24 hours: Temp:  [97.3 F (36.3 C)-98.2 F (36.8 C)] 98.2 F (36.8 C) (10/16 0529) Pulse Rate:  [63-104] 104 (10/16 0900) Resp:  [12-33] 24 (10/16 0900) BP: (93-131)/(51-107) 131/87 mmHg (10/16 0900) SpO2:  [93 %-100 %] 96 % (10/16 0900) Weight:  [77.2 kg (170 lb 3.1 oz)] 77.2 kg (170 lb 3.1 oz) (10/16 0529) Weight change: 5.5 kg (12 lb 2 oz) Last BM Date: 10/29/14  Intake/Output from previous day: 10/15 0701 - 10/16 0700 In: 2884.3 [P.O.:1570; I.V.:1314.3] Out: 900 [Urine:900]  PHYSICAL EXAM General appearance: alert, cooperative and mild distress Resp: clear to auscultation bilaterally Cardio: regular rate and rhythm, S1, S2 normal, no murmur, click, rub or gallop GI: soft, non-tender; bowel sounds normal; no masses,  no organomegaly Extremities: extremities normal, atraumatic, no cyanosis or edema  Lab Results:  Results for orders placed or performed during the hospital encounter of 10/28/14 (from the past 48 hour(s))  Protime-INR     Status: Abnormal   Collection Time: 10/28/14  9:32 AM  Result Value Ref Range   Prothrombin Time 16.6 (H) 11.6 - 15.2 seconds   INR 1.33 0.00 - 1.49  TSH     Status: None   Collection Time: 10/28/14  9:32 AM  Result Value Ref Range   TSH 1.611 0.350 - 4.500 uIU/mL  Hemoglobin A1c     Status: Abnormal   Collection Time: 10/28/14  9:32 AM  Result Value Ref Range   Hgb A1c MFr Bld 6.1 (H) 4.8 - 5.6 %    Comment: (NOTE)         Pre-diabetes: 5.7 - 6.4         Diabetes: >6.4         Glycemic control for adults with diabetes: <7.0    Mean Plasma Glucose 128 mg/dL    Comment: (NOTE) Performed At: [facility] [place], [place]  [id number] Lindon Romp MD ZD:[id number]   MRSA PCR Screening     Status: None   Collection Time: 10/28/14 11:31 AM  Result Value Ref Range   MRSA by PCR NEGATIVE NEGATIVE    Comment:        The GeneXpert MRSA Assay (FDA approved for NASAL specimens only), is one component of a comprehensive MRSA colonization surveillance program. It is not intended to diagnose MRSA infection nor to guide or monitor treatment for MRSA infections.   Prepare RBC     Status: None   Collection Time: 10/28/14 12:30 PM  Result Value Ref Range   Order Confirmation ORDER PROCESSED BY BLOOD BANK   CBC     Status: Abnormal   Collection Time: 10/28/14  6:31 PM  Result Value Ref Range   WBC 8.2 4.0 - 10.5 K/uL   RBC 3.76 (L) 4.22 - 5.81 MIL/uL   Hemoglobin 11.3 (L) 13.0 - 17.0 g/dL   HCT 34.2 (L) 39.0 - 52.0 %   MCV 91.0 78.0 - 100.0 fL   MCH 30.1 26.0 - 34.0 pg   MCHC 33.0 30.0 - 36.0 g/dL   RDW 15.4 11.5 - 15.5 %   Platelets 170 150 - 400 K/uL  CBC     Status:  Abnormal   Collection Time: 10/28/14 10:28 PM  Result Value Ref Range   WBC 8.6 4.0 - 10.5 K/uL   RBC 3.56 (L) 4.22 - 5.81 MIL/uL   Hemoglobin 10.6 (L) 13.0 - 17.0 g/dL   HCT 32.3 (L) 39.0 - 52.0 %   MCV 90.7 78.0 - 100.0 fL   MCH 29.8 26.0 - 34.0 pg   MCHC 32.8 30.0 - 36.0 g/dL   RDW 15.6 (H) 11.5 - 15.5 %   Platelets 163 150 - 400 K/uL  CBC     Status: Abnormal   Collection Time: 10/29/14  4:49 AM  Result Value Ref Range   WBC 9.0 4.0 - 10.5 K/uL   RBC 3.54 (L) 4.22 - 5.81 MIL/uL   Hemoglobin 10.6 (L) 13.0 - 17.0 g/dL   HCT 32.4 (L) 39.0 - 52.0 %   MCV 91.5 78.0 - 100.0 fL   MCH 29.9 26.0 - 34.0 pg   MCHC 32.7 30.0 - 36.0 g/dL   RDW 15.4 11.5 - 15.5 %   Platelets 169 150 - 400 K/uL  Comprehensive metabolic panel     Status: Abnormal   Collection Time: 10/29/14  4:49 AM  Result Value Ref Range   Sodium 142 135 - 145 mmol/L    Comment: DELTA CHECK NOTED   Potassium 4.1 3.5 - 5.1 mmol/L    Comment: DELTA CHECK NOTED    Chloride 113 (H) 101 - 111 mmol/L   CO2 24 22 - 32 mmol/L   Glucose, Bld 91 65 - 99 mg/dL   BUN 28 (H) 6 - 20 mg/dL   Creatinine, Ser 1.35 (H) 0.61 - 1.24 mg/dL   Calcium 8.8 (L) 8.9 - 10.3 mg/dL   Total Protein 5.8 (L) 6.5 - 8.1 g/dL   Albumin 3.1 (L) 3.5 - 5.0 g/dL   AST 19 15 - 41 U/L   ALT 11 (L) 17 - 63 U/L   Alkaline Phosphatase 74 38 - 126 U/L   Total Bilirubin 1.2 0.3 - 1.2 mg/dL   GFR calc non Af Amer 48 (L) >60 mL/min   GFR calc Af Amer 56 (L) >60 mL/min    Comment: (NOTE) The eGFR has been calculated using the CKD EPI equation. This calculation has not been validated in all clinical situations. eGFR's persistently <60 mL/min signify possible Chronic Kidney Disease.    Anion gap 5 5 - 15  CBC with Differential/Platelet     Status: Abnormal   Collection Time: 10/30/14  4:44 AM  Result Value Ref Range   WBC 8.3 4.0 - 10.5 K/uL   RBC 3.30 (L) 4.22 - 5.81 MIL/uL   Hemoglobin 10.0 (L) 13.0 - 17.0 g/dL   HCT 30.1 (L) 39.0 - 52.0 %   MCV 91.2 78.0 - 100.0 fL   MCH 30.3 26.0 - 34.0 pg   MCHC 33.2 30.0 - 36.0 g/dL   RDW 15.7 (H) 11.5 - 15.5 %   Platelets 156 150 - 400 K/uL   Neutrophils Relative % 72 %   Neutro Abs 6.0 1.7 - 7.7 K/uL   Lymphocytes Relative 15 %   Lymphs Abs 1.2 0.7 - 4.0 K/uL   Monocytes Relative 9 %   Monocytes Absolute 0.8 0.1 - 1.0 K/uL   Eosinophils Relative 4 %   Eosinophils Absolute 0.3 0.0 - 0.7 K/uL   Basophils Relative 0 %   Basophils Absolute 0.0 0.0 - 0.1 K/uL  Basic metabolic panel     Status: Abnormal   Collection Time:  10/30/14  4:44 AM  Result Value Ref Range   Sodium 142 135 - 145 mmol/L   Potassium 3.8 3.5 - 5.1 mmol/L   Chloride 115 (H) 101 - 111 mmol/L   CO2 22 22 - 32 mmol/L   Glucose, Bld 108 (H) 65 - 99 mg/dL   BUN 19 6 - 20 mg/dL   Creatinine, Ser 1.28 (H) 0.61 - 1.24 mg/dL   Calcium 8.8 (L) 8.9 - 10.3 mg/dL   GFR calc non Af Amer 51 (L) >60 mL/min   GFR calc Af Amer 59 (L) >60 mL/min    Comment: (NOTE) The eGFR has  been calculated using the CKD EPI equation. This calculation has not been validated in all clinical situations. eGFR's persistently <60 mL/min signify possible Chronic Kidney Disease.    Anion gap 5 5 - 15    ABGS No results for input(s): PHART, PO2ART, TCO2, HCO3 in the last 72 hours.  Invalid input(s): PCO2 CULTURES Recent Results (from the past 240 hour(s))  MRSA PCR Screening     Status: None   Collection Time: 10/28/14 11:31 AM  Result Value Ref Range Status   MRSA by PCR NEGATIVE NEGATIVE Final    Comment:        The GeneXpert MRSA Assay (FDA approved for NASAL specimens only), is one component of a comprehensive MRSA colonization surveillance program. It is not intended to diagnose MRSA infection nor to guide or monitor treatment for MRSA infections.    Studies/Results: No results found.  Medications:  Prior to Admission:  Prescriptions prior to admission  Medication Sig Dispense Refill Last Dose  . acetaminophen (TYLENOL) 500 MG tablet Take 1,000 mg by mouth daily as needed for moderate pain.    Past Month at Unknown time  . allopurinol (ZYLOPRIM) 100 MG tablet Take 1 tablet (100 mg total) by mouth daily. 90 tablet 1 10/27/2014 at Unknown time  . Ascorbic Acid (VITAMIN C PO) Take 1 tablet by mouth daily.   10/27/2014 at Unknown time  . aspirin EC 81 MG tablet Take 81 mg by mouth every evening.   10/27/2014 at Unknown time  . carvedilol (COREG) 25 MG tablet Take 1 tablet (25 mg total) by mouth 2 (two) times daily with a meal. 180 tablet 1 10/27/2014 at 1800  . Cholecalciferol (VITAMIN D PO) Take 2 tablets by mouth daily.   10/27/2014 at Unknown time  . furosemide (LASIX) 40 MG tablet Take 1 tablet (40 mg total) by mouth 2 (two) times daily. 180 tablet 1 10/27/2014 at 1600  . HYDROcodone-acetaminophen (NORCO) 5-325 MG per tablet Take 2 tablets by mouth every 4 (four) hours as needed. 6 tablet 0 Past Week at Unknown time  . isosorbide dinitrate (ISORDIL) 5 MG tablet  Take 5 mg by mouth 2 (two) times daily.   10/27/2014 at 1800  . losartan (COZAAR) 25 MG tablet Take 1 tablet (25 mg total) by mouth daily. 90 tablet 1 10/27/2014 at Unknown time  . nitroGLYCERIN (NITROSTAT) 0.4 MG SL tablet Place 1 tablet (0.4 mg total) under the tongue every 5 (five) minutes as needed for chest pain. For chest pain 30 tablet 6 Past Week at Unknown time  . omeprazole (PRILOSEC) 20 MG capsule Take 1 capsule (20 mg total) by mouth daily. (Patient taking differently: Take 20 mg by mouth 2 (two) times daily before a meal. ) 30 capsule 3 10/27/2014 at Unknown time  . ondansetron (ZOFRAN ODT) 4 MG disintegrating tablet Take 1 tablet (4 mg total)  by mouth every 8 (eight) hours as needed for nausea or vomiting. (Patient taking differently: Take 4 mg by mouth 2 (two) times daily. ) 20 tablet 0 10/27/2014 at Unknown time  . OXYGEN Inhale 2 L into the lungs at bedtime. May use daily as needed   10/28/2014 at Unknown time  . sertraline (ZOLOFT) 50 MG tablet Take 50 mg by mouth daily.  5 10/27/2014 at Unknown time  . spironolactone (ALDACTONE) 25 MG tablet Take 1 tablet (25 mg total) by mouth daily. 90 tablet 1 10/27/2014 at Unknown time  . mirtazapine (REMERON) 15 MG tablet TAKE 1 TABLET BY MOUTH AT BEDTIME 30 tablet 2   . temazepam (RESTORIL) 30 MG capsule Take 1 capsule (30 mg total) by mouth at bedtime as needed for sleep. 90 capsule 1 Taking   Scheduled: . allopurinol  100 mg Oral Daily  . antiseptic oral rinse  7 mL Mouth Rinse BID  . carvedilol  25 mg Oral BID WC  . isosorbide dinitrate  5 mg Oral BID  . losartan  25 mg Oral Daily  . mirtazapine  15 mg Oral QHS  . pantoprazole  40 mg Oral QPC breakfast  . sertraline  50 mg Oral Daily   Continuous: . sodium chloride    . 0.9 % NaCl with KCl 20 mEq / L 50 mL/hr at 10/29/14 2216   EVO:[id number] chloride, acetaminophen **OR** acetaminophen, butamben-tetracaine-benzocaine, HYDROcodone-acetaminophen, HYDROcodone-homatropine, HYDROmorphone  (DILAUDID) injection, levalbuterol, LORazepam, meperidine, midazolam, nitroGLYCERIN, ondansetron **OR** ondansetron (ZOFRAN) IV, simethicone susp in sterile water 1000 mL irrigation  Assesment: He was admitted with hematochezia which has stopped. He had acute blood loss anemia and received a blood transfusion and he is doing better. His hemoglobin level this morning is 10  He has hypertension which is pretty well controlled  He has nonischemic cardiomyopathy with chronic systolic heart failure with an AICD device which is stable  He has generalized anxiety at least partially because of the death of his last remaining brother.  He has chronic kidney disease which appears to be about the same  He may have some dementia at baseline  He has cough and congestion but his chest is actually pretty clear he is producing discolored sputum Principal Problem:   Hematochezia Active Problems:   Nocturnal hypoxia   Nonischemic cardiomyopathy (HCC)   Chronic systolic heart failure (HCC)   Automatic implantable cardioverter-defibrillator in situ   Prediabetes   Essential hypertension   Acute blood loss anemia   Stage III chronic kidney disease   GI (gastrointestinal bleed)   Generalized abdominal cramping    Plan: Transfer out of ICU. I've given him medications for anxiety. I'm going to go ahead and start Aricept. I will put him on oral doxycycline.    LOS: 2 days   Demitria Hay L 10/30/2014, 9:29 AM

## 2014-10-31 DIAGNOSIS — F03918 Unspecified dementia, unspecified severity, with other behavioral disturbance: Secondary | ICD-10-CM | POA: Diagnosis present

## 2014-10-31 DIAGNOSIS — F0391 Unspecified dementia with behavioral disturbance: Secondary | ICD-10-CM | POA: Diagnosis present

## 2014-10-31 LAB — CBC WITH DIFFERENTIAL/PLATELET
Basophils Absolute: 0 10*3/uL (ref 0.0–0.1)
Basophils Relative: 0 %
EOS PCT: 4 %
Eosinophils Absolute: 0.3 10*3/uL (ref 0.0–0.7)
HCT: 29.9 % — ABNORMAL LOW (ref 39.0–52.0)
Hemoglobin: 9.9 g/dL — ABNORMAL LOW (ref 13.0–17.0)
LYMPHS ABS: 1 10*3/uL (ref 0.7–4.0)
Lymphocytes Relative: 13 %
MCH: 30.6 pg (ref 26.0–34.0)
MCHC: 33.1 g/dL (ref 30.0–36.0)
MCV: 92.3 fL (ref 78.0–100.0)
MONOS PCT: 8 %
Monocytes Absolute: 0.6 10*3/uL (ref 0.1–1.0)
Neutro Abs: 5.8 10*3/uL (ref 1.7–7.7)
Neutrophils Relative %: 75 %
PLATELETS: 166 10*3/uL (ref 150–400)
RBC: 3.24 MIL/uL — AB (ref 4.22–5.81)
RDW: 15.3 % (ref 11.5–15.5)
WBC: 7.8 10*3/uL (ref 4.0–10.5)

## 2014-10-31 LAB — BASIC METABOLIC PANEL
Anion gap: 4 — ABNORMAL LOW (ref 5–15)
BUN: 14 mg/dL (ref 6–20)
CHLORIDE: 113 mmol/L — AB (ref 101–111)
CO2: 26 mmol/L (ref 22–32)
Calcium: 8.9 mg/dL (ref 8.9–10.3)
Creatinine, Ser: 1.14 mg/dL (ref 0.61–1.24)
GFR calc Af Amer: >60 mL/min (ref >60)
GFR, EST NON AFRICAN AMERICAN: 59 mL/min — AB (ref >60)
GLUCOSE: 104 mg/dL — AB (ref 65–99)
POTASSIUM: 4 mmol/L (ref 3.5–5.1)
Sodium: 143 mmol/L (ref 135–145)

## 2014-10-31 MED ORDER — FERROUS SULFATE 325 (65 FE) MG PO TABS: 325.0000 mg | ORAL_TABLET | Freq: Every day | ORAL | Status: AC

## 2014-10-31 MED ORDER — DOXYCYCLINE HYCLATE 100 MG PO TABS: 100.0000 mg | ORAL_TABLET | Freq: Two times a day (BID) | ORAL | Status: DC

## 2014-10-31 MED ORDER — LORAZEPAM 0.5 MG PO TABS: 0.5000 mg | ORAL_TABLET | Freq: Four times a day (QID) | ORAL | Status: DC | PRN

## 2014-10-31 MED ORDER — OMEPRAZOLE 20 MG PO CPDR: 20.0000 mg | DELAYED_RELEASE_CAPSULE | Freq: Two times a day (BID) | ORAL | Status: DC

## 2014-10-31 MED ORDER — DONEPEZIL HCL 5 MG PO TABS
5.0000 mg | ORAL_TABLET | Freq: Every day | ORAL | Status: DC
Start: 2014-10-31 — End: 2015-03-17

## 2014-10-31 MED ORDER — ONDANSETRON 4 MG PO TBDP: 4.0000 mg | ORAL_TABLET | Freq: Two times a day (BID) | ORAL | Status: DC

## 2014-10-31 NOTE — Discharge Summary (Signed)
Physician Discharge Summary  Patient ID: [name] Macias MRN: [id number] DOB/AGE: 16-Feb-1932 81 y.o. Primary Care Physician:Ashwath Lasch L, MD Admit date: 10/28/2014 Discharge date: 10/31/2014    Discharge Diagnoses:   Principal Problem:   Hematochezia Active Problems:   Nocturnal hypoxia   Nonischemic cardiomyopathy (HCC)   Chronic systolic heart failure (HCC)   Automatic implantable cardioverter-defibrillator in situ   Prediabetes   Essential hypertension   Acute blood loss anemia   Stage III chronic kidney disease   GI (gastrointestinal bleed)   Generalized abdominal cramping  Dementia   Medication List    STOP taking these medications        aspirin EC 81 MG tablet     temazepam 30 MG capsule  Commonly known as:  RESTORIL      TAKE these medications        acetaminophen 500 MG tablet  Commonly known as:  TYLENOL  Take 1,000 mg by mouth daily as needed for moderate pain.     allopurinol 100 MG tablet  Commonly known as:  ZYLOPRIM  Take 1 tablet (100 mg total) by mouth daily.     carvedilol 25 MG tablet  Commonly known as:  COREG  Take 1 tablet (25 mg total) by mouth 2 (two) times daily with a meal.     donepezil 5 MG tablet  Commonly known as:  ARICEPT  Take 1 tablet (5 mg total) by mouth at bedtime.     doxycycline 100 MG tablet  Commonly known as:  VIBRA-TABS  Take 1 tablet (100 mg total) by mouth every 12 (twelve) hours.     ferrous sulfate 325 (65 FE) MG tablet  Commonly known as:  FERROUSUL  Take 1 tablet (325 mg total) by mouth daily with breakfast.     furosemide 40 MG tablet  Commonly known as:  LASIX  Take 1 tablet (40 mg total) by mouth 2 (two) times daily.     HYDROcodone-acetaminophen 5-325 MG tablet  Commonly known as:  NORCO  Take 2 tablets by mouth every 4 (four) hours as needed.     isosorbide dinitrate 5 MG tablet  Commonly known as:  ISORDIL  Take 5 mg by mouth 2 (two) times daily.     LORazepam 0.5 MG tablet  Commonly  known as:  ATIVAN  Take 1 tablet (0.5 mg total) by mouth every 6 (six) hours as needed for anxiety.     losartan 25 MG tablet  Commonly known as:  COZAAR  Take 1 tablet (25 mg total) by mouth daily.     mirtazapine 15 MG tablet  Commonly known as:  REMERON  TAKE 1 TABLET BY MOUTH AT BEDTIME     nitroGLYCERIN 0.4 MG SL tablet  Commonly known as:  NITROSTAT  Place 1 tablet (0.4 mg total) under the tongue every 5 (five) minutes as needed for chest pain. For chest pain     omeprazole 20 MG capsule  Commonly known as:  PRILOSEC  Take 1 capsule (20 mg total) by mouth 2 (two) times daily before a meal.     ondansetron 4 MG disintegrating tablet  Commonly known as:  ZOFRAN ODT  Take 1 tablet (4 mg total) by mouth 2 (two) times daily.     OXYGEN  Inhale 2 L into the lungs at bedtime. May use daily as needed     sertraline 50 MG tablet  Commonly known as:  ZOLOFT  Take 50 mg by mouth daily.     spironolactone  25 MG tablet  Commonly known as:  ALDACTONE  Take 1 tablet (25 mg total) by mouth daily.     VITAMIN C PO  Take 1 tablet by mouth daily.     VITAMIN D PO  Take 2 tablets by mouth daily.        Discharged Condition: Improved    Consults: GI  Significant Diagnostic Studies: Ct Abdomen Pelvis W Contrast  10/28/2014  CLINICAL DATA:  Low abdominal pain with large amounts of bright red blood in stools. EXAM: CT ABDOMEN AND PELVIS WITH CONTRAST TECHNIQUE: Multidetector CT imaging of the abdomen and pelvis was performed using the standard protocol following bolus administration of intravenous contrast. CONTRAST:  14mL OMNIPAQUE IOHEXOL 300 MG/ML SOLN, 89mL OMNIPAQUE IOHEXOL 300 MG/ML SOLN COMPARISON:  05/17/2014 FINDINGS: Atelectasis in the lung bases.  Diffuse cardiac enlargement. The liver, spleen, gallbladder, pancreas, adrenal glands, inferior vena cava, and retroperitoneal lymph nodes are unremarkable. Multiple bilateral renal cysts. Largest on the left measures 3.8 cm  diameter. No hydronephrosis in either kidney. Nephrograms appear symmetrical. Calcification of the abdominal aorta and branch vessels with ectatic iliac arteries. Calcific atherosclerotic changes in the celiac axis and superior mesenteric artery although the vessels appear patent. Focal stenosis not excluded. Stomach, small bowel, and colon are not abnormally distended. There is a small umbilical hernia containing small bowel but without proximal obstruction. Diffusely stool-filled colon with scattered diverticula. No free air or free fluid in the abdomen. Pelvis: Prostate gland is mildly enlarged. Bladder wall is not thickened. No free or loculated pelvic fluid collections. No pelvic mass or lymphadenopathy. Appendix is normal. Small inguinal hernias containing fat. Degenerative changes throughout the spine. No destructive bone lesions. IMPRESSION: Small umbilical hernia containing small bowel. No bowel obstruction or inflammation. Diverticulosis throughout the colon without inflammatory change. Bilateral fat containing inguinal hernias. No specific abnormality demonstrated to explain history of rectal bleeding. Electronically Signed   By: Lucienne Capers M.D.   On: 10/28/2014 06:01    Lab Results: Basic Metabolic Panel:  Recent Labs  10/30/14 0444 10/31/14 0607  NA 142 143  K 3.8 4.0  CL 115* 113*  CO2 22 26  GLUCOSE 108* 104*  BUN 19 14  CREATININE 1.28* 1.14  CALCIUM 8.8* 8.9   Liver Function Tests:  Recent Labs  10/29/14 0449  AST 19  ALT 11*  ALKPHOS 74  BILITOT 1.2  PROT 5.8*  ALBUMIN 3.1*     CBC:  Recent Labs  10/30/14 0444 10/31/14 0607  WBC 8.3 7.8  NEUTROABS 6.0 5.8  HGB 10.0* 9.9*  HCT 30.1* 29.9*  MCV 91.2 92.3  PLT 156 166    Recent Results (from the past 240 hour(s))  MRSA PCR Screening     Status: None   Collection Time: 10/28/14 11:31 AM  Result Value Ref Range Status   MRSA by PCR NEGATIVE NEGATIVE Final    Comment:        The GeneXpert MRSA  Assay (FDA approved for NASAL specimens only), is one component of a comprehensive MRSA colonization surveillance program. It is not intended to diagnose MRSA infection nor to guide or monitor treatment for MRSA infections.      Hospital Course: This is an 82 year old who came to the emergency department because of blood in his stool. He had a significant amount of blood and he was noted to be anemic and required 2 units of packed red blood cells. He had confusion during his admission and became somewhat agitated. He is  known to have heart failure and despite fluid and blood resuscitation did not have any symptoms from that. His wife says he been having trouble with confusion and memory loss at home. He was very anxious that he relates to the death of his final remaining sibling this week. He had GI consultation and underwent EGD and colonoscopy. Although no definite bleeding site was identified he had numerous diverticuli and it was felt that he had probably had a diverticular bleed. He had no further bleeding his hemoglobin stabilized and he was ready for discharge on the 17th  Discharge Exam: Blood pressure 115/67, pulse 84, temperature 98.4 F (36.9 C), temperature source Oral, resp. rate 18, height 5\' 8"  (1.727 m), weight 75.8 kg (167 lb 1.7 oz), SpO2 100 %. He is awake and alert. He is mildly confused. His chest is clear.  Disposition: Home with home health services      Discharge Instructions    Discharge patient    Complete by:  As directed      Face-to-face encounter (required for Medicare/Medicaid patients)    Complete by:  As directed   I Merci Walthers L certify that this patient is under my care and that I, or a nurse practitioner or physician's assistant working with me, had a face-to-face encounter that meets the physician face-to-face encounter requirements with this patient on 10/31/2014. The encounter with the patient was in whole, or in part for the following medical  condition(s) which is the primary reason for home health care (List medical condition): GI bleeding/dementia  The encounter with the patient was in whole, or in part, for the following medical condition, which is the primary reason for home health care:  GI beeding/dementia  I certify that, based on my findings, the following services are medically necessary home health services:   Nursing Physical therapy    Reason for Medically Necessary Home Health Services:  Skilled Nursing- Change/Decline in Patient Status  My clinical findings support the need for the above services:  Cognitive impairments, dementia, or mental confusion  that make it unsafe to leave home  Further, I certify that my clinical findings support that this patient is homebound due to:  Mental confusion     Home Health    Complete by:  As directed   To provide the following care/treatments:   PT RN Social work    Please check cbc on 11/03/2014             Signed: Natalee Tomkiewicz L   10/31/2014, 8:57 AM

## 2014-10-31 NOTE — Progress Notes (Signed)
Subjective: He says he feels okay. He's had more confusion and his wife says that he did not recognize her last night. No further bleeding  Objective: Vital signs in last 24 hours: Temp:  [97.9 F (36.6 C)-98.8 F (37.1 C)] 98.4 F (36.9 C) (10/17 0629) Pulse Rate:  [75-104] 84 (10/17 0629) Resp:  [18-31] 18 (10/17 0629) BP: (96-131)/(50-87) 115/67 mmHg (10/17 0629) SpO2:  [93 %-100 %] 100 % (10/17 0629) Weight:  [75.8 kg (167 lb 1.7 oz)] 75.8 kg (167 lb 1.7 oz) (10/17 0629) Weight change: -1.4 kg (-3 lb 1.4 oz) Last BM Date: 10/30/14  Intake/Output from previous day: 10/16 0701 - 10/17 0700 In: 780 [P.O.:480; I.V.:300] Out: 300 [Urine:300]  PHYSICAL EXAM General appearance: alert, cooperative and no distress Resp: clear to auscultation bilaterally Cardio: regular rate and rhythm, S1, S2 normal, no murmur, click, rub or gallop GI: soft, non-tender; bowel sounds normal; no masses,  no organomegaly Extremities: extremities normal, atraumatic, no cyanosis or edema  Lab Results:  Results for orders placed or performed during the hospital encounter of 10/28/14 (from the past 48 hour(s))  CBC with Differential/Platelet     Status: Abnormal   Collection Time: 10/30/14  4:44 AM  Result Value Ref Range   WBC 8.3 4.0 - 10.5 K/uL   RBC 3.30 (L) 4.22 - 5.81 MIL/uL   Hemoglobin 10.0 (L) 13.0 - 17.0 g/dL   HCT 30.1 (L) 39.0 - 52.0 %   MCV 91.2 78.0 - 100.0 fL   MCH 30.3 26.0 - 34.0 pg   MCHC 33.2 30.0 - 36.0 g/dL   RDW 15.7 (H) 11.5 - 15.5 %   Platelets 156 150 - 400 K/uL   Neutrophils Relative % 72 %   Neutro Abs 6.0 1.7 - 7.7 K/uL   Lymphocytes Relative 15 %   Lymphs Abs 1.2 0.7 - 4.0 K/uL   Monocytes Relative 9 %   Monocytes Absolute 0.8 0.1 - 1.0 K/uL   Eosinophils Relative 4 %   Eosinophils Absolute 0.3 0.0 - 0.7 K/uL   Basophils Relative 0 %   Basophils Absolute 0.0 0.0 - 0.1 K/uL  Basic metabolic panel     Status: Abnormal   Collection Time: 10/30/14  4:44 AM  Result  Value Ref Range   Sodium 142 135 - 145 mmol/L   Potassium 3.8 3.5 - 5.1 mmol/L   Chloride 115 (H) 101 - 111 mmol/L   CO2 22 22 - 32 mmol/L   Glucose, Bld 108 (H) 65 - 99 mg/dL   BUN 19 6 - 20 mg/dL   Creatinine, Ser 1.28 (H) 0.61 - 1.24 mg/dL   Calcium 8.8 (L) 8.9 - 10.3 mg/dL   GFR calc non Af Amer 51 (L) >60 mL/min   GFR calc Af Amer 59 (L) >60 mL/min    Comment: (NOTE) The eGFR has been calculated using the CKD EPI equation. This calculation has not been validated in all clinical situations. eGFR's persistently <60 mL/min signify possible Chronic Kidney Disease.    Anion gap 5 5 - 15  CBC with Differential/Platelet     Status: Abnormal   Collection Time: 10/31/14  6:07 AM  Result Value Ref Range   WBC 7.8 4.0 - 10.5 K/uL   RBC 3.24 (L) 4.22 - 5.81 MIL/uL   Hemoglobin 9.9 (L) 13.0 - 17.0 g/dL   HCT 29.9 (L) 39.0 - 52.0 %   MCV 92.3 78.0 - 100.0 fL   MCH 30.6 26.0 - 34.0 pg   MCHC  33.1 30.0 - 36.0 g/dL   RDW 15.3 11.5 - 15.5 %   Platelets 166 150 - 400 K/uL   Neutrophils Relative % 75 %   Neutro Abs 5.8 1.7 - 7.7 K/uL   Lymphocytes Relative 13 %   Lymphs Abs 1.0 0.7 - 4.0 K/uL   Monocytes Relative 8 %   Monocytes Absolute 0.6 0.1 - 1.0 K/uL   Eosinophils Relative 4 %   Eosinophils Absolute 0.3 0.0 - 0.7 K/uL   Basophils Relative 0 %   Basophils Absolute 0.0 0.0 - 0.1 K/uL  Basic metabolic panel     Status: Abnormal   Collection Time: 10/31/14  6:07 AM  Result Value Ref Range   Sodium 143 135 - 145 mmol/L   Potassium 4.0 3.5 - 5.1 mmol/L   Chloride 113 (H) 101 - 111 mmol/L   CO2 26 22 - 32 mmol/L   Glucose, Bld 104 (H) 65 - 99 mg/dL   BUN 14 6 - 20 mg/dL   Creatinine, Ser 1.14 0.61 - 1.24 mg/dL   Calcium 8.9 8.9 - 10.3 mg/dL   GFR calc non Af Amer 59 (L) >60 mL/min   GFR calc Af Amer >60 >60 mL/min    Comment: (NOTE) The eGFR has been calculated using the CKD EPI equation. This calculation has not been validated in all clinical situations. eGFR's persistently  <60 mL/min signify possible Chronic Kidney Disease.    Anion gap 4 (L) 5 - 15    ABGS No results for input(s): PHART, PO2ART, TCO2, HCO3 in the last 72 hours.  Invalid input(s): PCO2 CULTURES Recent Results (from the past 240 hour(s))  MRSA PCR Screening     Status: None   Collection Time: 10/28/14 11:31 AM  Result Value Ref Range Status   MRSA by PCR NEGATIVE NEGATIVE Final    Comment:        The GeneXpert MRSA Assay (FDA approved for NASAL specimens only), is one component of a comprehensive MRSA colonization surveillance program. It is not intended to diagnose MRSA infection nor to guide or monitor treatment for MRSA infections.    Studies/Results: No results found.  Medications:  Scheduled: . allopurinol  100 mg Oral Daily  . carvedilol  25 mg Oral BID WC  . donepezil  5 mg Oral QHS  . doxycycline  100 mg Oral Q12H  . doxycycline  100 mg Oral Q12H  . isosorbide dinitrate  5 mg Oral BID  . losartan  25 mg Oral Daily  . mirtazapine  15 mg Oral QHS  . pantoprazole  40 mg Oral QPC breakfast  . sertraline  50 mg Oral Daily   Continuous:   Assesment: He had GI bleeding presumed to be from diverticulitis. He has a new problem which has been diagnosed this admission which is dementia and I think he'll do better at home. He has no further evidence of bleeding and I think he is ready for discharge. He has nonischemic cardiomyopathy with chronic systolic heart failure but despite his anemia and fluid resuscitation he did not develop any symptoms Principal Problem:   Hematochezia Active Problems:   Nocturnal hypoxia   Nonischemic cardiomyopathy (HCC)   Chronic systolic heart failure (HCC)   Automatic implantable cardioverter-defibrillator in situ   Prediabetes   Essential hypertension   Acute blood loss anemia   Stage III chronic kidney disease   GI (gastrointestinal bleed)   Generalized abdominal cramping    Plan: Discharge home today    LOS: 3  days    Taiwan Talcott L 10/31/2014, 8:43 AM

## 2014-10-31 NOTE — Care Management Note (Signed)
Case Management Note  Patient Details  Name: [name] Macias MRN: [id number] Date of Birth: 1932-06-04  Expected Discharge Date:  11/01/14               Expected Discharge Plan:  [place]  In-House Referral:  NA  Discharge planning Services  CM Consult  Post Acute Care Choice:  Home Health Choice offered to:  Patient  DME Arranged:    DME Agency:     HH Arranged:  RN, PT, Social Work [employer] Agency:  [place]  Status of Service:  Completed, signed off  Medicare Important Message Given:    Date Medicare IM Given:    Medicare IM give by:    Date Additional Medicare IM Given:    Additional Medicare Important Message give by:     If discussed at [place] of Stay Meetings, dates discussed:    Additional Comments: Pt is from home, lives with wife and is ind at baseline. Pt admitted with GI bleed. Pt has cane and walker at home for PRN use. Has home O2 for nighttime use.  Pt emotionally upset this morning over missing his brother's funeral on the day of admit. Pt plans to return home at DC with [facility] services. Pt has used [facility] in the past and would like them again. Romualdo Bolk, of [facility], made aware of referral and will obtain pt info from chart. Pt aware HH has 48 hours to make first visit. No further CM needs at this time.  Sherald Barge, RN 10/31/2014, 9:17 AM

## 2014-10-31 NOTE — Care Management Important Message (Signed)
Important Message  Patient Details  Name: [name] Macias MRN: [id number] Date of Birth: 09-22-1932   Medicare Important Message Given:  Yes-second notification given    Sherald Barge, RN 10/31/2014, 9:21 AM

## 2014-10-31 NOTE — Progress Notes (Signed)
Discharge instructions and prescriptions given, verbalized understanding, out in stable condition via w/c with staff. 

## 2014-11-01 ENCOUNTER — Encounter (HOSPITAL_COMMUNITY): Payer: Self-pay | Admitting: Internal Medicine

## 2014-11-01 LAB — TYPE AND SCREEN
ABO/RH(D): O POS
ANTIBODY SCREEN: NEGATIVE
UNIT DIVISION: 0
Unit division: 0
Unit division: 0
Unit division: 0

## 2014-11-03 ENCOUNTER — Other Ambulatory Visit (HOSPITAL_COMMUNITY)
Admission: RE | Admit: 2014-11-03 | Discharge: 2014-11-03 | Disposition: A | Discharge status: Home / Self Care | Payer: Commercial Managed Care - HMO | Source: Other Acute Inpatient Hospital | Attending: Pulmonary Disease | Admitting: Pulmonary Disease

## 2014-11-03 DIAGNOSIS — K921 Melena: Secondary | ICD-10-CM | POA: Diagnosis present

## 2014-11-03 LAB — CBC WITH DIFFERENTIAL/PLATELET
BASOS ABS: 0 10*3/uL (ref 0.0–0.1)
Basophils Relative: 0 %
Eosinophils Absolute: 0.2 10*3/uL (ref 0.0–0.7)
Eosinophils Relative: 3 %
HEMATOCRIT: 31.1 % — AB (ref 39.0–52.0)
HEMOGLOBIN: 10.1 g/dL — AB (ref 13.0–17.0)
LYMPHS PCT: 15 %
Lymphs Abs: 0.9 10*3/uL (ref 0.7–4.0)
MCH: 30.1 pg (ref 26.0–34.0)
MCHC: 32.5 g/dL (ref 30.0–36.0)
MCV: 92.6 fL (ref 78.0–100.0)
Monocytes Absolute: 0.4 10*3/uL (ref 0.1–1.0)
Monocytes Relative: 6 %
NEUTROS ABS: 4.7 10*3/uL (ref 1.7–7.7)
Neutrophils Relative %: 76 %
PLATELETS: 215 10*3/uL (ref 150–400)
RBC: 3.36 MIL/uL — AB (ref 4.22–5.81)
RDW: 15.3 % (ref 11.5–15.5)
WBC: 6.2 10*3/uL (ref 4.0–10.5)

## 2014-12-05 ENCOUNTER — Ambulatory Visit (INDEPENDENT_AMBULATORY_CARE_PROVIDER_SITE_OTHER): Payer: Commercial Managed Care - HMO | Admitting: Internal Medicine

## 2014-12-10 ENCOUNTER — Emergency Department (HOSPITAL_COMMUNITY): Payer: Commercial Managed Care - HMO

## 2014-12-10 ENCOUNTER — Inpatient Hospital Stay (HOSPITAL_COMMUNITY)
Admission: EM | Admit: 2014-12-10 | Discharge: 2014-12-13 | DRG: 291 | Disposition: A | Discharge status: Home Health | Payer: Commercial Managed Care - HMO | Attending: Pulmonary Disease | Admitting: Pulmonary Disease

## 2014-12-10 ENCOUNTER — Encounter (HOSPITAL_COMMUNITY): Payer: Self-pay

## 2014-12-10 DIAGNOSIS — I5023 Acute on chronic systolic (congestive) heart failure: Secondary | ICD-10-CM | POA: Diagnosis present

## 2014-12-10 DIAGNOSIS — I509 Heart failure, unspecified: Secondary | ICD-10-CM | POA: Diagnosis present

## 2014-12-10 DIAGNOSIS — M1990 Unspecified osteoarthritis, unspecified site: Secondary | ICD-10-CM | POA: Diagnosis present

## 2014-12-10 DIAGNOSIS — Z87891 Personal history of nicotine dependence: Secondary | ICD-10-CM | POA: Diagnosis not present

## 2014-12-10 DIAGNOSIS — I130 Hypertensive heart and chronic kidney disease with heart failure and stage 1 through stage 4 chronic kidney disease, or unspecified chronic kidney disease: Secondary | ICD-10-CM | POA: Diagnosis present

## 2014-12-10 DIAGNOSIS — I10 Essential (primary) hypertension: Secondary | ICD-10-CM | POA: Diagnosis not present

## 2014-12-10 DIAGNOSIS — Z833 Family history of diabetes mellitus: Secondary | ICD-10-CM | POA: Diagnosis not present

## 2014-12-10 DIAGNOSIS — J441 Chronic obstructive pulmonary disease with (acute) exacerbation: Secondary | ICD-10-CM | POA: Diagnosis present

## 2014-12-10 DIAGNOSIS — Z8546 Personal history of malignant neoplasm of prostate: Secondary | ICD-10-CM | POA: Diagnosis not present

## 2014-12-10 DIAGNOSIS — Z950 Presence of cardiac pacemaker: Secondary | ICD-10-CM | POA: Diagnosis not present

## 2014-12-10 DIAGNOSIS — Z8249 Family history of ischemic heart disease and other diseases of the circulatory system: Secondary | ICD-10-CM | POA: Diagnosis not present

## 2014-12-10 DIAGNOSIS — I472 Ventricular tachycardia: Secondary | ICD-10-CM | POA: Diagnosis present

## 2014-12-10 DIAGNOSIS — R7989 Other specified abnormal findings of blood chemistry: Secondary | ICD-10-CM | POA: Diagnosis not present

## 2014-12-10 DIAGNOSIS — F0390 Unspecified dementia without behavioral disturbance: Secondary | ICD-10-CM | POA: Diagnosis present

## 2014-12-10 DIAGNOSIS — F419 Anxiety disorder, unspecified: Secondary | ICD-10-CM | POA: Diagnosis present

## 2014-12-10 DIAGNOSIS — Z8042 Family history of malignant neoplasm of prostate: Secondary | ICD-10-CM | POA: Diagnosis not present

## 2014-12-10 DIAGNOSIS — I2510 Atherosclerotic heart disease of native coronary artery without angina pectoris: Secondary | ICD-10-CM | POA: Diagnosis present

## 2014-12-10 DIAGNOSIS — N183 Chronic kidney disease, stage 3 unspecified: Secondary | ICD-10-CM | POA: Diagnosis present

## 2014-12-10 DIAGNOSIS — K219 Gastro-esophageal reflux disease without esophagitis: Secondary | ICD-10-CM | POA: Diagnosis present

## 2014-12-10 DIAGNOSIS — R778 Other specified abnormalities of plasma proteins: Secondary | ICD-10-CM | POA: Diagnosis present

## 2014-12-10 HISTORY — DX: Chronic kidney disease, stage 3 unspecified: N18.30

## 2014-12-10 HISTORY — DX: Chronic kidney disease, stage 3 (moderate): N18.3

## 2014-12-10 LAB — CBC WITH DIFFERENTIAL/PLATELET
BASOS ABS: 0 10*3/uL (ref 0.0–0.1)
BASOS PCT: 0 %
EOS ABS: 0.2 10*3/uL (ref 0.0–0.7)
EOS PCT: 3 %
HEMATOCRIT: 33 % — AB (ref 39.0–52.0)
Hemoglobin: 11 g/dL — ABNORMAL LOW (ref 13.0–17.0)
Lymphocytes Relative: 17 %
Lymphs Abs: 1 10*3/uL (ref 0.7–4.0)
MCH: 30.3 pg (ref 26.0–34.0)
MCHC: 33.3 g/dL (ref 30.0–36.0)
MCV: 90.9 fL (ref 78.0–100.0)
MONO ABS: 0.4 10*3/uL (ref 0.1–1.0)
MONOS PCT: 7 %
Neutro Abs: 4.4 10*3/uL (ref 1.7–7.7)
Neutrophils Relative %: 73 %
PLATELETS: 207 10*3/uL (ref 150–400)
RBC: 3.63 MIL/uL — ABNORMAL LOW (ref 4.22–5.81)
RDW: 14.9 % (ref 11.5–15.5)
WBC: 6.1 10*3/uL (ref 4.0–10.5)

## 2014-12-10 LAB — TYPE AND SCREEN
ABO/RH(D): O POS
Antibody Screen: NEGATIVE

## 2014-12-10 LAB — PROTIME-INR
INR: 1.17 (ref 0.00–1.49)
Prothrombin Time: 15.1 seconds (ref 11.6–15.2)

## 2014-12-10 LAB — COMPREHENSIVE METABOLIC PANEL
ALT: 22 U/L (ref 17–63)
AST: 21 U/L (ref 15–41)
Albumin: 3.7 g/dL (ref 3.5–5.0)
Alkaline Phosphatase: 88 U/L (ref 38–126)
Anion gap: 11 (ref 5–15)
BUN: 30 mg/dL — AB (ref 6–20)
CALCIUM: 8.8 mg/dL — AB (ref 8.9–10.3)
CHLORIDE: 103 mmol/L (ref 101–111)
CO2: 24 mmol/L (ref 22–32)
CREATININE: 1.94 mg/dL — AB (ref 0.61–1.24)
GFR, EST AFRICAN AMERICAN: 36 mL/min — AB (ref >60)
GFR, EST NON AFRICAN AMERICAN: 31 mL/min — AB (ref >60)
Glucose, Bld: 106 mg/dL — ABNORMAL HIGH (ref 65–99)
POTASSIUM: 3.1 mmol/L — AB (ref 3.5–5.1)
Sodium: 138 mmol/L (ref 135–145)
TOTAL PROTEIN: 6.9 g/dL (ref 6.5–8.1)
Total Bilirubin: 0.7 mg/dL (ref 0.3–1.2)

## 2014-12-10 LAB — BRAIN NATRIURETIC PEPTIDE: B Natriuretic Peptide: 2794 pg/mL — ABNORMAL HIGH (ref 0.0–100.0)

## 2014-12-10 LAB — TROPONIN I: TROPONIN I: 0.1 ng/mL — AB (ref <0.031)

## 2014-12-10 MED ORDER — ISOSORBIDE DINITRATE 5 MG PO TABS
5.0000 mg | ORAL_TABLET | Freq: Two times a day (BID) | ORAL | Status: DC
  Administered 2014-12-10 – 2014-12-13 (×6): 5 mg via ORAL
  Filled 2014-12-10 (×8): qty 1

## 2014-12-10 MED ORDER — ONDANSETRON 4 MG PO TBDP
4.0000 mg | ORAL_TABLET | Freq: Two times a day (BID) | ORAL | Status: DC
  Administered 2014-12-10 – 2014-12-13 (×6): 4 mg via ORAL
  Filled 2014-12-10 (×6): qty 1

## 2014-12-10 MED ORDER — ALPRAZOLAM 0.25 MG PO TABS: 0.2500 mg | ORAL_TABLET | Freq: Two times a day (BID) | ORAL | Status: DC | PRN

## 2014-12-10 MED ORDER — POTASSIUM CHLORIDE CRYS ER 20 MEQ PO TBCR
40.0000 meq | EXTENDED_RELEASE_TABLET | Freq: Two times a day (BID) | ORAL | Status: DC
  Filled 2014-12-10: qty 2

## 2014-12-10 MED ORDER — SERTRALINE HCL 50 MG PO TABS
50.0000 mg | ORAL_TABLET | Freq: Every day | ORAL | Status: DC
  Administered 2014-12-11 – 2014-12-13 (×3): 50 mg via ORAL
  Filled 2014-12-10 (×3): qty 1

## 2014-12-10 MED ORDER — FERROUS SULFATE 325 (65 FE) MG PO TABS
325.0000 mg | ORAL_TABLET | Freq: Every day | ORAL | Status: DC
  Administered 2014-12-11 – 2014-12-13 (×3): 325 mg via ORAL
  Filled 2014-12-10 (×3): qty 1

## 2014-12-10 MED ORDER — ALLOPURINOL 100 MG PO TABS
100.0000 mg | ORAL_TABLET | Freq: Every day | ORAL | Status: DC
  Administered 2014-12-11 – 2014-12-13 (×3): 100 mg via ORAL
  Filled 2014-12-10 (×3): qty 1

## 2014-12-10 MED ORDER — POTASSIUM CHLORIDE 10 MEQ/100ML IV SOLN
10.0000 meq | INTRAVENOUS | Status: AC
  Administered 2014-12-10 (×4): 10 meq via INTRAVENOUS
  Filled 2014-12-10: qty 100

## 2014-12-10 MED ORDER — SODIUM CHLORIDE 0.9 % IJ SOLN: 3.0000 mL | INTRAMUSCULAR | Status: DC | PRN

## 2014-12-10 MED ORDER — ONDANSETRON HCL 4 MG/2ML IJ SOLN: 4.0000 mg | Freq: Four times a day (QID) | INTRAMUSCULAR | Status: DC | PRN

## 2014-12-10 MED ORDER — LOSARTAN POTASSIUM 50 MG PO TABS
25.0000 mg | ORAL_TABLET | Freq: Every day | ORAL | Status: DC
  Administered 2014-12-11 – 2014-12-13 (×3): 25 mg via ORAL
  Filled 2014-12-10 (×3): qty 1

## 2014-12-10 MED ORDER — SPIRONOLACTONE 25 MG PO TABS
25.0000 mg | ORAL_TABLET | Freq: Every day | ORAL | Status: DC
  Administered 2014-12-11 – 2014-12-13 (×3): 25 mg via ORAL
  Filled 2014-12-10 (×3): qty 1

## 2014-12-10 MED ORDER — HYDROCODONE-ACETAMINOPHEN 5-325 MG PO TABS
2.0000 | ORAL_TABLET | ORAL | Status: DC | PRN
  Administered 2014-12-12: 2 via ORAL
  Filled 2014-12-10: qty 2

## 2014-12-10 MED ORDER — ISOSORBIDE DINITRATE 10 MG PO TABS
ORAL_TABLET | ORAL | Status: AC
  Filled 2014-12-10: qty 1

## 2014-12-10 MED ORDER — INDOMETHACIN 25 MG PO CAPS: 25.0000 mg | ORAL_CAPSULE | Freq: Two times a day (BID) | ORAL | Status: DC | PRN

## 2014-12-10 MED ORDER — DONEPEZIL HCL 5 MG PO TABS
5.0000 mg | ORAL_TABLET | Freq: Every day | ORAL | Status: DC
  Administered 2014-12-10 – 2014-12-12 (×3): 5 mg via ORAL
  Filled 2014-12-10 (×3): qty 1

## 2014-12-10 MED ORDER — LORAZEPAM 0.5 MG PO TABS
0.5000 mg | ORAL_TABLET | Freq: Four times a day (QID) | ORAL | Status: DC | PRN
  Administered 2014-12-10 – 2014-12-12 (×3): 0.5 mg via ORAL
  Filled 2014-12-10 (×3): qty 1

## 2014-12-10 MED ORDER — ONDANSETRON HCL 4 MG/2ML IJ SOLN
4.0000 mg | Freq: Once | INTRAMUSCULAR | Status: AC
  Administered 2014-12-10: 4 mg via INTRAVENOUS
  Filled 2014-12-10: qty 2

## 2014-12-10 MED ORDER — FUROSEMIDE 10 MG/ML IJ SOLN
40.0000 mg | Freq: Two times a day (BID) | INTRAMUSCULAR | Status: DC
  Administered 2014-12-11 – 2014-12-12 (×4): 40 mg via INTRAVENOUS
  Filled 2014-12-10 (×5): qty 4

## 2014-12-10 MED ORDER — ACETAMINOPHEN 325 MG PO TABS: 650.0000 mg | ORAL_TABLET | ORAL | Status: DC | PRN

## 2014-12-10 MED ORDER — PANTOPRAZOLE SODIUM 40 MG PO TBEC
80.0000 mg | DELAYED_RELEASE_TABLET | Freq: Every day | ORAL | Status: DC
  Administered 2014-12-11 – 2014-12-13 (×3): 80 mg via ORAL
  Filled 2014-12-10 (×3): qty 2

## 2014-12-10 MED ORDER — ENOXAPARIN SODIUM 40 MG/0.4ML ~~LOC~~ SOLN
40.0000 mg | SUBCUTANEOUS | Status: DC
  Administered 2014-12-10 – 2014-12-12 (×3): 40 mg via SUBCUTANEOUS
  Filled 2014-12-10 (×3): qty 0.4

## 2014-12-10 MED ORDER — SODIUM CHLORIDE 0.9 % IJ SOLN
3.0000 mL | Freq: Two times a day (BID) | INTRAMUSCULAR | Status: DC
  Administered 2014-12-11 – 2014-12-12 (×3): 3 mL via INTRAVENOUS

## 2014-12-10 MED ORDER — ASPIRIN EC 81 MG PO TBEC
81.0000 mg | DELAYED_RELEASE_TABLET | Freq: Every day | ORAL | Status: DC
  Administered 2014-12-11 – 2014-12-13 (×3): 81 mg via ORAL
  Filled 2014-12-10 (×3): qty 1

## 2014-12-10 MED ORDER — FUROSEMIDE 10 MG/ML IJ SOLN
40.0000 mg | Freq: Once | INTRAMUSCULAR | Status: AC
  Administered 2014-12-10: 40 mg via INTRAVENOUS
  Filled 2014-12-10: qty 4

## 2014-12-10 MED ORDER — MIRTAZAPINE 15 MG PO TABS
15.0000 mg | ORAL_TABLET | Freq: Every day | ORAL | Status: DC
  Administered 2014-12-12: 15 mg via ORAL
  Filled 2014-12-10: qty 1

## 2014-12-10 MED ORDER — SODIUM CHLORIDE 0.9 % IV SOLN: 250.0000 mL | INTRAVENOUS | Status: DC | PRN

## 2014-12-10 NOTE — H&P (Signed)
History and Physical  [name] Macias [id number] DOB: [birth year] DOA: 12/10/2014  Referring physician: Dr Tomi Bamberger, ED physician PCP: Alonza Bogus, MD   Chief Complaint: Fatigue and shortness of breath  HPI: [name] Macias is a 81 y.o. male  With a history of nonischemic cardiomyopathy with ejection fraction of 15-20% on echo in 2011, ICD implantation, stage III chronic kidney disease, COPD, coronary artery disease.  The patient was hospitalized from 10/14 until 10/17 due to rectal bleeding was found to have colonic diverticular bleed. Patient had 2 units of blood blood.  He remained stable until approximately 3 days ago when he started having weakness and shortness of breath that would occur after ambulating for 50-75 feet. Shortness of breath worse with exertion and improved with rest. Symptoms are worsening. He denies any further bleeding episodes or melena. He denies any weight gain, edema, increased salt intake.   Review of Systems:   Pt denies any fevers, chills, nausea, vomiting, diarrhea, constipation, abdominal pain, cough, wheezing, palpitations, headache, vision changes, lightheadedness, dizziness, diarrhea, constipation, melena, rectal bleeding.  Review of systems are otherwise negative  Past Medical History  Diagnosis Date  . Arthritis   . Gout   . Non-ischemic cardiomyopathy ([place])   . Hypertension   . Arthritis     left foot   . CAD (coronary artery disease) of artery bypass graft   . COPD (chronic obstructive pulmonary disease) ([place])   . History of syphilis   . Type 2 HSV infection of penis   . Erectile dysfunction   . Anxiety disorder   . Dementia   . Anxiety   . Angina   . GERD (gastroesophageal reflux disease)   . Ascending aortic aneurysm ([place])   . Lung nodule     RUL nodule noted on abdominal CT  . Automatic implantable cardioverter-defibrillator in situ 07/20/2008    Qualifier: Diagnosis of  By: Lovena Le, MD, Martyn Malay   . Chronic systolic heart  failure ([place]) 11/28/2009    Qualifier: Diagnosis of  By: Lovena Le, MD, Martyn Malay   . Prostate cancer [facility] 02/03/2013    Diagnosed in 01/2013 by Dr Michela Pitcher adenocarcinoma felt to be localized, gleason score 6 with a pSA level of 8.4. No bone scan by Dr Michela Pitcher, referred to oncology, no radiation treatment offered    . Chronic kidney disease     Stage III-IV   Past Surgical History  Procedure Laterality Date  . Left ankle      Not Done by Dr. Aline Brochure   . S/p  biventricular pacemaker / aicd implantation  02/19/05  . Defribrillator implant    . Crtd upgrade  12/14/2009  . Transthoracic echocardiogram  2011  . Doppler echocardiography  2009  . Insert / replace / remove pacemaker    . Esophagogastroduodenoscopy N/A 07/01/2013    Procedure: ESOPHAGOGASTRODUODENOSCOPY (EGD);  Surgeon: Rogene Houston, MD;  [facility];  Service: Endoscopy;  Laterality: N/A;  145-moved to 1245 Ann notfied pt  . Esophagogastroduodenoscopy N/A 10/28/2014    Procedure: ESOPHAGOGASTRODUODENOSCOPY (EGD);  Surgeon: Rogene Houston, MD;  Location: [facility];  Service: Endoscopy;  Laterality: N/A;  . Colonoscopy N/A 10/29/2014    Procedure: COLONOSCOPY;  Surgeon: Rogene Houston, MD;  Location: [facility];  Service: Endoscopy;  Laterality: N/A;   Social History:  reports that he quit smoking about 51 years ago. His smoking use included Cigarettes. He has a 10 pack-year smoking history. He has never used smokeless tobacco. He  reports that he does not drink alcohol or use illicit drugs. Patient lives at home & is able to participate in activities of daily living   Allergies  Allergen Reactions  . Codeine Hives  . Enalapril Maleate Hives    hives  . Sulfonamide Derivatives Hives    Family History  Problem Relation Age of Onset  . Heart failure Father   . Prostate cancer Brother   . Diabetes Brother   . Hypertension Brother   . Heart failure Brother      Prior to Admission medications     Medication Sig Start Date End Date Taking? Authorizing Provider  acetaminophen (TYLENOL) 500 MG tablet Take 1,000 mg by mouth daily as needed for moderate pain.    Yes Historical Provider, MD  allopurinol (ZYLOPRIM) 100 MG tablet Take 1 tablet (100 mg total) by mouth daily. 11/03/13  Yes Fayrene Helper, MD  ALPRAZolam Duanne Moron) 0.25 MG tablet Take 0.25 mg by mouth 2 (two) times daily as needed for anxiety.  10/24/14  Yes Historical Provider, MD  Ascorbic Acid (VITAMIN C PO) Take 1 tablet by mouth daily.   Yes Historical Provider, MD  carvedilol (COREG) 25 MG tablet Take 1 tablet (25 mg total) by mouth 2 (two) times daily with a meal. 11/03/13  Yes Fayrene Helper, MD  Cholecalciferol (VITAMIN D PO) Take 2 tablets by mouth daily.   Yes Historical Provider, MD  donepezil (ARICEPT) 5 MG tablet Take 1 tablet (5 mg total) by mouth at bedtime. 10/31/14  Yes Sinda Du, MD  ferrous sulfate (FERROUSUL) 325 (65 FE) MG tablet Take 1 tablet (325 mg total) by mouth daily with breakfast. 10/31/14  Yes Sinda Du, MD  furosemide (LASIX) 40 MG tablet Take 1 tablet (40 mg total) by mouth 2 (two) times daily. 11/03/13  Yes Fayrene Helper, MD  HYDROcodone-acetaminophen (NORCO) 5-325 MG per tablet Take 2 tablets by mouth every 4 (four) hours as needed. Patient taking differently: Take 2 tablets by mouth every 4 (four) hours as needed for moderate pain.  05/17/14  Yes Elnora Morrison, MD  indomethacin (INDOCIN) 25 MG capsule Take 25 mg by mouth 2 (two) times daily as needed for mild pain or moderate pain.  10/29/14  Yes Historical Provider, MD  LORazepam (ATIVAN) 0.5 MG tablet Take 1 tablet (0.5 mg total) by mouth every 6 (six) hours as needed for anxiety. 10/31/14  Yes Sinda Du, MD  omeprazole (PRILOSEC) 20 MG capsule Take 1 capsule (20 mg total) by mouth 2 (two) times daily before a meal. 10/31/14  Yes Sinda Du, MD  benzonatate (TESSALON) 200 MG capsule TAKE ONE CAPSULE BY MOUTH 3 TIMES A DAY  AS NEEDED FOR COUGH 11/18/14   Historical Provider, MD  doxycycline (VIBRA-TABS) 100 MG tablet Take 1 tablet (100 mg total) by mouth every 12 (twelve) hours. Patient not taking: Reported on 12/10/2014 10/31/14   Sinda Du, MD  isosorbide dinitrate (ISORDIL) 5 MG tablet Take 5 mg by mouth 2 (two) times daily.    Historical Provider, MD  losartan (COZAAR) 25 MG tablet Take 1 tablet (25 mg total) by mouth daily. 11/03/13   Fayrene Helper, MD  mirtazapine (REMERON) 15 MG tablet TAKE 1 TABLET BY MOUTH AT BEDTIME 06/17/14   Fayrene Helper, MD  nitroGLYCERIN (NITROSTAT) 0.4 MG SL tablet Place 1 tablet (0.4 mg total) under the tongue every 5 (five) minutes as needed for chest pain. For chest pain 03/18/14   Evans Lance, MD  ondansetron (ZOFRAN ODT) 4 MG disintegrating tablet Take 1 tablet (4 mg total) by mouth 2 (two) times daily. 10/31/14   Sinda Du, MD  OXYGEN Inhale 2 L into the lungs at bedtime. May use daily as needed    Historical Provider, MD  sertraline (ZOLOFT) 50 MG tablet Take 50 mg by mouth daily. 04/11/14   Historical Provider, MD  spironolactone (ALDACTONE) 25 MG tablet Take 1 tablet (25 mg total) by mouth daily. 11/03/13   Fayrene Helper, MD    Physical Exam: BP 112/74 mmHg  Pulse 71  Temp(Src) 97.6 F (36.4 C) (Oral)  Resp 25  Ht 5\' 7"  (1.702 m)  Wt 68.04 kg (150 lb)  BMI 23.49 kg/m2  SpO2 100%  General: Elderly black male. Awake and alert and oriented x3. No acute cardiopulmonary distress.  Eyes: Pupils equal, round, reactive to light. Extraocular muscles are intact. Sclerae anicteric and noninjected.  ENT:  Moist mucosal membranes. No mucosal lesions.  Neck: Neck supple without lymphadenopathy. No carotid bruits. No masses palpated.  Cardiovascular: Regular rate with normal S1-S2 sounds. No murmurs, rubs, gallops auscultated. JVD to 8 cm.  Respiratory: Good respiratory effort with no wheezes, rales, rhonchi. Lungs clear to auscultation bilaterally.    Abdomen: Soft, nontender, nondistended. Active bowel sounds. No masses or hepatosplenomegaly  Skin: Dry, warm to touch. 2+ dorsalis pedis and radial pulses. 1+ edema in the lower shoulders bilaterally Musculoskeletal: No calf or leg pain. All major joints not erythematous nontender.  Psychiatric: Intact judgment and insight.  Neurologic: No focal neurological deficits. Cranial nerves II through XII are grossly intact.           Labs on Admission:  Basic Metabolic Panel:  Recent Labs Lab 12/10/14 1508  NA 138  K 3.1*  CL 103  CO2 24  GLUCOSE 106*  BUN 30*  CREATININE 1.94*  CALCIUM 8.8*   Liver Function Tests:  Recent Labs Lab 12/10/14 1508  AST 21  ALT 22  ALKPHOS 88  BILITOT 0.7  PROT 6.9  ALBUMIN 3.7   No results for input(s): LIPASE, AMYLASE in the last 168 hours. No results for input(s): AMMONIA in the last 168 hours. CBC:  Recent Labs Lab 12/10/14 1508  WBC 6.1  NEUTROABS 4.4  HGB 11.0*  HCT 33.0*  MCV 90.9  PLT 207   Cardiac Enzymes:  Recent Labs Lab 12/10/14 1508  TROPONINI 0.10*    BNP (last 3 results)  Recent Labs  01/24/14 1235 12/10/14 1508  BNP 1108.0* 2794.0*    ProBNP (last 3 results) No results for input(s): PROBNP in the last 8760 hours.  CBG: No results for input(s): GLUCAP in the last 168 hours.  Radiological Exams on Admission: Dg Chest 2 View  12/10/2014  CLINICAL DATA:  Fatigue and shortness of breath EXAM: CHEST  2 VIEW COMPARISON:  01/24/2014 chest radiograph. FINDINGS: Stable configuration of 3 lead left subclavian ICD with lead tips in the right atrium and right ventricular apex. Stable cardiomediastinal silhouette with moderate cardiomegaly. No pneumothorax. Trace left pleural effusion. Mild pulmonary edema. IMPRESSION: 1. Mild congestive heart failure. 2. Trace left pleural effusion. Electronically Signed   By: Ilona Sorrel M.D.   On: 12/10/2014 15:57    EKG: Independently reviewed. Sinus rhythm. Possible  right bundle branch block. No change from prior. No ST elevation or depression.  Assessment/Plan Present on Admission:  . Acute on chronic systolic heart failure ([place]) . Elevated troponin . HTN (hypertension) . Stage III chronic kidney disease  This patient was discussed with the ED physician, including pertinent vitals, physical exam findings, labs, and imaging.  We also discussed care given by the ED provider.  #1 acute on chronic systolic heart failure  Admit to telemetry  Strict I's and O's  Lasix 40 mg IV 2 times a day  Replaced potassium  Recheck creatinine in the morning  Check echocardiogram in the morning  Continue angiotensin receptor blocker  Hold beta blocker #2 elevated troponin  This appears to be chronic. #3 hypertension  Hold beta blocker as acute decompensation and hypotension #4 stage III chronic kidney disease  We'll be cautious with Lasix  Recheck creatinine in the morning #5 GERD  Protonix  DVT prophylaxis: Lovenox  Consultants: none  Code Status: full code  Family Communication: Wife in the room   Disposition Plan: Telemetry admission   Truett Mainland, DO [facility] Pager [phone]

## 2014-12-10 NOTE — ED Provider Notes (Signed)
CSN: [id number]     Arrival date & time 12/10/14  1409 History   First MD Initiated Contact with Patient 12/10/14 1455     Chief Complaint  Patient presents with  . Weakness    HPI Patient presents to the emergency room with complaints of fatigue. Patient states he is admitted to the hospital the end of October. He was just released from the hospital couple weeks ago. He was admitted for GI bleeding associated with rectal polyps. Patient required several blood transfusions. He does not feel like he has ever returned back to baseline since that hospitalization. He continues to feel weak and fatigued. He walks he becomes short of breath. He is due to follow-up with his doctor next week but today he felt worse and decided to come into the emergency room. He denies any trouble with nausea or vomiting. Has not noticed any blood in his stool. He occasionally does have pain in his lower abdomen although is not experiencing any right now. Past Medical History  Diagnosis Date  . Arthritis   . Gout   . Non-ischemic cardiomyopathy ([place])   . Hypertension   . Arthritis     left foot   . CAD (coronary artery disease) of artery bypass graft   . COPD (chronic obstructive pulmonary disease) ([place])   . History of syphilis   . Type 2 HSV infection of penis   . Erectile dysfunction   . Anxiety disorder   . Dementia   . Anxiety   . Angina   . GERD (gastroesophageal reflux disease)   . Ascending aortic aneurysm ([place])   . Lung nodule     RUL nodule noted on abdominal CT  . Automatic implantable cardioverter-defibrillator in situ 07/20/2008    Qualifier: Diagnosis of  By: Lovena Le, MD, Martyn Malay   . Chronic systolic heart failure ([place]) 11/28/2009    Qualifier: Diagnosis of  By: Lovena Le, MD, Martyn Malay   . Prostate cancer [facility]) 02/03/2013    Diagnosed in 01/2013 by Dr Michela Pitcher adenocarcinoma felt to be localized, gleason score 6 with a pSA level of 8.4. No bone scan by Dr Michela Pitcher, referred to  oncology, no radiation treatment offered    . Chronic kidney disease     Stage III-IV   Past Surgical History  Procedure Laterality Date  . Left ankle      Not Done by Dr. Aline Brochure   . S/p  biventricular pacemaker / aicd implantation  02/19/05  . Defribrillator implant    . Crtd upgrade  12/14/2009  . Transthoracic echocardiogram  2011  . Doppler echocardiography  2009  . Insert / replace / remove pacemaker    . Esophagogastroduodenoscopy N/A 07/01/2013    Procedure: ESOPHAGOGASTRODUODENOSCOPY (EGD);  Surgeon: Rogene Houston, MD;  [facility];  Service: Endoscopy;  Laterality: N/A;  145-moved to 1245 Ann notfied pt  . Esophagogastroduodenoscopy N/A 10/28/2014    Procedure: ESOPHAGOGASTRODUODENOSCOPY (EGD);  Surgeon: Rogene Houston, MD;  Location: [facility];  Service: Endoscopy;  Laterality: N/A;  . Colonoscopy N/A 10/29/2014    Procedure: COLONOSCOPY;  Surgeon: Rogene Houston, MD;  Location: [facility];  Service: Endoscopy;  Laterality: N/A;   Family History  Problem Relation Age of Onset  . Heart failure Father   . Prostate cancer Brother   . Diabetes Brother   . Hypertension Brother   . Heart failure Brother    Social History  Substance Use Topics  . Smoking status:  Former Smoker -- 1.00 packs/day for 10 years    Types: Cigarettes    Quit date: 01/15/1963  . Smokeless tobacco: Never Used  . Alcohol Use: No    Review of Systems  All other systems reviewed and are negative.     Allergies  Codeine; Enalapril maleate; and Sulfonamide derivatives  Home Medications   Prior to Admission medications   Medication Sig Start Date End Date Taking? Authorizing Provider  acetaminophen (TYLENOL) 500 MG tablet Take 1,000 mg by mouth daily as needed for moderate pain.    Yes Historical Provider, MD  allopurinol (ZYLOPRIM) 100 MG tablet Take 1 tablet (100 mg total) by mouth daily. 11/03/13  Yes Fayrene Helper, MD  ALPRAZolam Duanne Moron) 0.25 MG tablet Take 0.25  mg by mouth 2 (two) times daily as needed for anxiety.  10/24/14  Yes Historical Provider, MD  Ascorbic Acid (VITAMIN C PO) Take 1 tablet by mouth daily.   Yes Historical Provider, MD  carvedilol (COREG) 25 MG tablet Take 1 tablet (25 mg total) by mouth 2 (two) times daily with a meal. 11/03/13  Yes Fayrene Helper, MD  Cholecalciferol (VITAMIN D PO) Take 2 tablets by mouth daily.   Yes Historical Provider, MD  donepezil (ARICEPT) 5 MG tablet Take 1 tablet (5 mg total) by mouth at bedtime. 10/31/14  Yes Sinda Du, MD  ferrous sulfate (FERROUSUL) 325 (65 FE) MG tablet Take 1 tablet (325 mg total) by mouth daily with breakfast. 10/31/14  Yes Sinda Du, MD  furosemide (LASIX) 40 MG tablet Take 1 tablet (40 mg total) by mouth 2 (two) times daily. 11/03/13  Yes Fayrene Helper, MD  HYDROcodone-acetaminophen (NORCO) 5-325 MG per tablet Take 2 tablets by mouth every 4 (four) hours as needed. Patient taking differently: Take 2 tablets by mouth every 4 (four) hours as needed for moderate pain.  05/17/14  Yes Elnora Morrison, MD  indomethacin (INDOCIN) 25 MG capsule Take 25 mg by mouth 2 (two) times daily as needed for mild pain or moderate pain.  10/29/14  Yes Historical Provider, MD  benzonatate (TESSALON) 200 MG capsule TAKE ONE CAPSULE BY MOUTH 3 TIMES A DAY AS NEEDED FOR COUGH 11/18/14   Historical Provider, MD  doxycycline (VIBRA-TABS) 100 MG tablet Take 1 tablet (100 mg total) by mouth every 12 (twelve) hours. Patient not taking: Reported on 12/10/2014 10/31/14   Sinda Du, MD  isosorbide dinitrate (ISORDIL) 5 MG tablet Take 5 mg by mouth 2 (two) times daily.    Historical Provider, MD  LORazepam (ATIVAN) 0.5 MG tablet Take 1 tablet (0.5 mg total) by mouth every 6 (six) hours as needed for anxiety. 10/31/14   Sinda Du, MD  losartan (COZAAR) 25 MG tablet Take 1 tablet (25 mg total) by mouth daily. 11/03/13   Fayrene Helper, MD  mirtazapine (REMERON) 15 MG tablet TAKE 1 TABLET BY  MOUTH AT BEDTIME 06/17/14   Fayrene Helper, MD  nitroGLYCERIN (NITROSTAT) 0.4 MG SL tablet Place 1 tablet (0.4 mg total) under the tongue every 5 (five) minutes as needed for chest pain. For chest pain 03/18/14   Evans Lance, MD  omeprazole (PRILOSEC) 20 MG capsule Take 1 capsule (20 mg total) by mouth 2 (two) times daily before a meal. 10/31/14   Sinda Du, MD  ondansetron (ZOFRAN ODT) 4 MG disintegrating tablet Take 1 tablet (4 mg total) by mouth 2 (two) times daily. 10/31/14   Sinda Du, MD  OXYGEN Inhale 2 L into the  lungs at bedtime. May use daily as needed    Historical Provider, MD  sertraline (ZOLOFT) 50 MG tablet Take 50 mg by mouth daily. 04/11/14   Historical Provider, MD  spironolactone (ALDACTONE) 25 MG tablet Take 1 tablet (25 mg total) by mouth daily. 11/03/13   Fayrene Helper, MD   BP 102/64 mmHg  Pulse 69  Temp(Src) 97.6 F (36.4 C) (Oral)  Resp 25  Ht 5\' 7"  (1.702 m)  Wt 68.04 kg  BMI 23.49 kg/m2  SpO2 94% Physical Exam  Constitutional: No distress.  HENT:  Head: Normocephalic and atraumatic.  Right Ear: External ear normal.  Left Ear: External ear normal.  Eyes: Conjunctivae are normal. Right eye exhibits no discharge. Left eye exhibits no discharge. No scleral icterus.  Neck: Neck supple. No tracheal deviation present.  Cardiovascular: Normal rate, regular rhythm and intact distal pulses.   Pulmonary/Chest: Effort normal and breath sounds normal. No stridor. No respiratory distress. He has no wheezes. He has no rales.  Abdominal: Soft. Bowel sounds are normal. He exhibits no distension. There is no tenderness. There is no rebound and no guarding.  Musculoskeletal: He exhibits no edema or tenderness.  Neurological: He is alert. He has normal strength. No cranial nerve deficit (no facial droop, extraocular movements intact, no slurred speech) or sensory deficit. He exhibits normal muscle tone. He displays no seizure activity. Coordination normal.  Skin:  Skin is warm and dry. No rash noted.  Psychiatric: He has a normal mood and affect.  Nursing note and vitals reviewed.   ED Course  Procedures (including critical care time) Labs Review Labs Reviewed  COMPREHENSIVE METABOLIC PANEL - Abnormal; Notable for the following:    Potassium 3.1 (*)    Glucose, Bld 106 (*)    BUN 30 (*)    Creatinine, Ser 1.94 (*)    Calcium 8.8 (*)    GFR calc non Af Amer 31 (*)    GFR calc Af Amer 36 (*)    All other components within normal limits  CBC WITH DIFFERENTIAL/PLATELET - Abnormal; Notable for the following:    RBC 3.63 (*)    Hemoglobin 11.0 (*)    HCT 33.0 (*)    All other components within normal limits  TROPONIN I - Abnormal; Notable for the following:    Troponin I 0.10 (*)    All other components within normal limits  BRAIN NATRIURETIC PEPTIDE - Abnormal; Notable for the following:    B Natriuretic Peptide 2794.0 (*)    All other components within normal limits  PROTIME-INR  TYPE AND SCREEN    Imaging Review Dg Chest 2 View  12/10/2014  CLINICAL DATA:  Fatigue and shortness of breath EXAM: CHEST  2 VIEW COMPARISON:  01/24/2014 chest radiograph. FINDINGS: Stable configuration of 3 lead left subclavian ICD with lead tips in the right atrium and right ventricular apex. Stable cardiomediastinal silhouette with moderate cardiomegaly. No pneumothorax. Trace left pleural effusion. Mild pulmonary edema. IMPRESSION: 1. Mild congestive heart failure. 2. Trace left pleural effusion. Electronically Signed   By: Ilona Sorrel M.D.   On: 12/10/2014 15:57   I have personally reviewed and evaluated these images and lab results as part of my medical decision-making.   EKG Interpretation   Date/Time:  Saturday December 10 2014 14:44:40 EST Ventricular Rate:  69 PR Interval:  210 QRS Duration: 174 QT Interval:  536 QTC Calculation: 574 R Axis:   -75 Text Interpretation:  Sinus rhythm ?electronic ventricular pacer Multiple  premature complexes,  vent & supraven IVCD, consider atypical RBBB No  significant change since last tracing Confirmed by Trenden Hazelrigg  MD-J, Diquan Kassis  [id number]) on 12/10/2014 3:12:07 PM      MDM   Final diagnoses:  Acute on chronic congestive heart failure, unspecified congestive heart failure type ([place])    The patient's laboratory tests and x-ray are suggestive of congestive heart failure.  I suspect this is the cause the patient's fatigue and shortness of breath.  Does not have any evidence of recurrent GI bleeding. His blood count is stable.  Possible his CHF exacerbation is related to the recent blood transfusions.  Plan on IV Lasix. Consult with the medical service for admission and further treatment considering his tachypnea and mild hypoxia.    Dorie Rank, MD 12/10/14 [phone]

## 2014-12-10 NOTE — ED Notes (Signed)
Pt oxygen saturation decreased to 87%, pt placed on 2 L oxygen. Saturation now 100%

## 2014-12-10 NOTE — ED Notes (Signed)
Patient recently hospitalized for rectal pulps for which he received surgery and multiple blood transfusions. Since discharge a couple of weeks ago patient has never regained strength, pt c/o of general weakness which has affected his ability to ambulate.

## 2014-12-11 ENCOUNTER — Inpatient Hospital Stay (HOSPITAL_COMMUNITY): Payer: Commercial Managed Care - HMO

## 2014-12-11 DIAGNOSIS — I509 Heart failure, unspecified: Secondary | ICD-10-CM

## 2014-12-11 LAB — BASIC METABOLIC PANEL
ANION GAP: 5 (ref 5–15)
BUN: 31 mg/dL — ABNORMAL HIGH (ref 6–20)
CALCIUM: 8.8 mg/dL — AB (ref 8.9–10.3)
CHLORIDE: 110 mmol/L (ref 101–111)
CO2: 26 mmol/L (ref 22–32)
CREATININE: 1.68 mg/dL — AB (ref 0.61–1.24)
GFR calc non Af Amer: 37 mL/min — ABNORMAL LOW (ref >60)
GFR, EST AFRICAN AMERICAN: 43 mL/min — AB (ref >60)
Glucose, Bld: 105 mg/dL — ABNORMAL HIGH (ref 65–99)
Potassium: 3.3 mmol/L — ABNORMAL LOW (ref 3.5–5.1)
SODIUM: 141 mmol/L (ref 135–145)

## 2014-12-11 MED ORDER — DOXYCYCLINE HYCLATE 100 MG PO TABS
100.0000 mg | ORAL_TABLET | Freq: Two times a day (BID) | ORAL | Status: DC
  Administered 2014-12-11 – 2014-12-13 (×5): 100 mg via ORAL
  Filled 2014-12-11 (×5): qty 1

## 2014-12-11 MED ORDER — POTASSIUM CHLORIDE CRYS ER 20 MEQ PO TBCR
20.0000 meq | EXTENDED_RELEASE_TABLET | Freq: Two times a day (BID) | ORAL | Status: DC
  Administered 2014-12-11 – 2014-12-13 (×5): 20 meq via ORAL
  Filled 2014-12-11 (×4): qty 1

## 2014-12-11 MED ORDER — HYDROCODONE-HOMATROPINE 5-1.5 MG/5ML PO SYRP
5.0000 mL | ORAL_SOLUTION | ORAL | Status: DC | PRN
  Administered 2014-12-11 – 2014-12-12 (×6): 5 mL via ORAL
  Filled 2014-12-11 (×6): qty 5

## 2014-12-11 MED ORDER — BENZONATATE 100 MG PO CAPS
200.0000 mg | ORAL_CAPSULE | Freq: Three times a day (TID) | ORAL | Status: DC | PRN
  Administered 2014-12-11 – 2014-12-12 (×3): 200 mg via ORAL
  Filled 2014-12-11 (×3): qty 2

## 2014-12-11 NOTE — Progress Notes (Signed)
Subjective: He was admitted yesterday with CHF. He has had increasing shortness of breath particularly with exertion over the last 5 days or so. He has had significant cough and has been coughing up some mildly purulent sputum as well. He denies any chest pain.  Objective: Vital signs in last 24 hours: Temp:  [97.6 F (36.4 C)-97.7 F (36.5 C)] 97.7 F (36.5 C) (11/27 0519) Pulse Rate:  [63-89] 89 (11/27 0519) Resp:  [18-29] 18 (11/27 0519) BP: (94-120)/(51-87) 120/87 mmHg (11/27 0519) SpO2:  [91 %-100 %] 100 % (11/27 0519) Weight:  [68.04 kg (150 lb)-71.26 kg (157 lb 1.6 oz)] 71.26 kg (157 lb 1.6 oz) (11/27 0519) Weight change:  Last BM Date: 12/10/14  Intake/Output from previous day:    PHYSICAL EXAM General appearance: alert and mild distress Resp: rales bilaterally and rhonchi bilaterally Cardio: regular rate and rhythm, S1, S2 normal, no murmur, click, rub or gallop GI: soft, non-tender; bowel sounds normal; no masses,  no organomegaly Extremities: extremities normal, atraumatic, no cyanosis or edema  Lab Results:  Results for orders placed or performed during the hospital encounter of 12/10/14 (from the past 48 hour(s))  Comprehensive metabolic panel     Status: Abnormal   Collection Time: 12/10/14  3:08 PM  Result Value Ref Range   Sodium 138 135 - 145 mmol/L   Potassium 3.1 (L) 3.5 - 5.1 mmol/L   Chloride 103 101 - 111 mmol/L   CO2 24 22 - 32 mmol/L   Glucose, Bld 106 (H) 65 - 99 mg/dL   BUN 30 (H) 6 - 20 mg/dL   Creatinine, Ser 1.94 (H) 0.61 - 1.24 mg/dL   Calcium 8.8 (L) 8.9 - 10.3 mg/dL   Total Protein 6.9 6.5 - 8.1 g/dL   Albumin 3.7 3.5 - 5.0 g/dL   AST 21 15 - 41 U/L   ALT 22 17 - 63 U/L   Alkaline Phosphatase 88 38 - 126 U/L   Total Bilirubin 0.7 0.3 - 1.2 mg/dL   GFR calc non Af Amer 31 (L) >60 mL/min   GFR calc Af Amer 36 (L) >60 mL/min    Comment: (NOTE) The eGFR has been calculated using the CKD EPI equation. This calculation has not been  validated in all clinical situations. eGFR's persistently <60 mL/min signify possible Chronic Kidney Disease.    Anion gap 11 5 - 15  CBC WITH DIFFERENTIAL     Status: Abnormal   Collection Time: 12/10/14  3:08 PM  Result Value Ref Range   WBC 6.1 4.0 - 10.5 K/uL   RBC 3.63 (L) 4.22 - 5.81 MIL/uL   Hemoglobin 11.0 (L) 13.0 - 17.0 g/dL   HCT 33.0 (L) 39.0 - 52.0 %   MCV 90.9 78.0 - 100.0 fL   MCH 30.3 26.0 - 34.0 pg   MCHC 33.3 30.0 - 36.0 g/dL   RDW 14.9 11.5 - 15.5 %   Platelets 207 150 - 400 K/uL   Neutrophils Relative % 73 %   Neutro Abs 4.4 1.7 - 7.7 K/uL   Lymphocytes Relative 17 %   Lymphs Abs 1.0 0.7 - 4.0 K/uL   Monocytes Relative 7 %   Monocytes Absolute 0.4 0.1 - 1.0 K/uL   Eosinophils Relative 3 %   Eosinophils Absolute 0.2 0.0 - 0.7 K/uL   Basophils Relative 0 %   Basophils Absolute 0.0 0.0 - 0.1 K/uL  Protime-INR     Status: None   Collection Time: 12/10/14  3:08 PM  Result  Value Ref Range   Prothrombin Time 15.1 11.6 - 15.2 seconds   INR 1.17 0.00 - 1.49  Troponin I     Status: Abnormal   Collection Time: 12/10/14  3:08 PM  Result Value Ref Range   Troponin I 0.10 (H) <0.031 ng/mL    Comment:        PERSISTENTLY INCREASED TROPONIN VALUES IN THE RANGE OF 0.04-0.49 ng/mL CAN BE SEEN IN:       -UNSTABLE ANGINA       -CONGESTIVE HEART FAILURE       -MYOCARDITIS       -CHEST TRAUMA       -ARRYHTHMIAS       -LATE PRESENTING MYOCARDIAL INFARCTION       -COPD   CLINICAL FOLLOW-UP RECOMMENDED.   Brain natriuretic peptide     Status: Abnormal   Collection Time: 12/10/14  3:08 PM  Result Value Ref Range   B Natriuretic Peptide 2794.0 (H) 0.0 - 100.0 pg/mL  Type and screen     Status: None   Collection Time: 12/10/14  3:10 PM  Result Value Ref Range   ABO/RH(D) O POS    Antibody Screen NEG    Sample Expiration 33/82/5053   Basic metabolic panel     Status: Abnormal   Collection Time: 12/11/14  5:49 AM  Result Value Ref Range   Sodium 141 135 - 145  mmol/L   Potassium 3.3 (L) 3.5 - 5.1 mmol/L   Chloride 110 101 - 111 mmol/L   CO2 26 22 - 32 mmol/L   Glucose, Bld 105 (H) 65 - 99 mg/dL   BUN 31 (H) 6 - 20 mg/dL   Creatinine, Ser 1.68 (H) 0.61 - 1.24 mg/dL   Calcium 8.8 (L) 8.9 - 10.3 mg/dL   GFR calc non Af Amer 37 (L) >60 mL/min   GFR calc Af Amer 43 (L) >60 mL/min    Comment: (NOTE) The eGFR has been calculated using the CKD EPI equation. This calculation has not been validated in all clinical situations. eGFR's persistently <60 mL/min signify possible Chronic Kidney Disease.    Anion gap 5 5 - 15    ABGS No results for input(s): PHART, PO2ART, TCO2, HCO3 in the last 72 hours.  Invalid input(s): PCO2 CULTURES No results found for this or any previous visit (from the past 240 hour(s)). Studies/Results: Dg Chest 2 View  12/10/2014  CLINICAL DATA:  Fatigue and shortness of breath EXAM: CHEST  2 VIEW COMPARISON:  01/24/2014 chest radiograph. FINDINGS: Stable configuration of 3 lead left subclavian ICD with lead tips in the right atrium and right ventricular apex. Stable cardiomediastinal silhouette with moderate cardiomegaly. No pneumothorax. Trace left pleural effusion. Mild pulmonary edema. IMPRESSION: 1. Mild congestive heart failure. 2. Trace left pleural effusion. Electronically Signed   By: Ilona Sorrel M.D.   On: 12/10/2014 15:57    Medications:  Prior to Admission:  Prescriptions prior to admission  Medication Sig Dispense Refill Last Dose  . acetaminophen (TYLENOL) 500 MG tablet Take 1,000 mg by mouth daily as needed for moderate pain.    unknown  . allopurinol (ZYLOPRIM) 100 MG tablet Take 1 tablet (100 mg total) by mouth daily. 90 tablet 1 12/10/2014 at Unknown time  . ALPRAZolam (XANAX) 0.25 MG tablet Take 0.25 mg by mouth 2 (two) times daily as needed for anxiety.   2 unknown at Unknown time  . Ascorbic Acid (VITAMIN C PO) Take 1 tablet by mouth daily.   12/10/2014  at Unknown time  . carvedilol (COREG) 25 MG  tablet Take 1 tablet (25 mg total) by mouth 2 (two) times daily with a meal. 180 tablet 1 12/10/2014 at 800A  . Cholecalciferol (VITAMIN D PO) Take 2 tablets by mouth daily.   12/10/2014 at Unknown time  . donepezil (ARICEPT) 5 MG tablet Take 1 tablet (5 mg total) by mouth at bedtime. 30 tablet 12 12/09/2014 at Unknown time  . ferrous sulfate (FERROUSUL) 325 (65 FE) MG tablet Take 1 tablet (325 mg total) by mouth daily with breakfast. 30 tablet 3 12/10/2014 at Unknown time  . furosemide (LASIX) 40 MG tablet Take 1 tablet (40 mg total) by mouth 2 (two) times daily. 180 tablet 1 12/10/2014 at Unknown time  . HYDROcodone-acetaminophen (NORCO) 5-325 MG per tablet Take 2 tablets by mouth every 4 (four) hours as needed. (Patient taking differently: Take 2 tablets by mouth every 4 (four) hours as needed for moderate pain. ) 6 tablet 0 Past Week at Unknown time  . indomethacin (INDOCIN) 25 MG capsule Take 25 mg by mouth 2 (two) times daily as needed for mild pain or moderate pain.   4 12/10/2014 at Unknown time  . LORazepam (ATIVAN) 0.5 MG tablet Take 1 tablet (0.5 mg total) by mouth every 6 (six) hours as needed for anxiety. 50 tablet 5 unknown  . omeprazole (PRILOSEC) 20 MG capsule Take 1 capsule (20 mg total) by mouth 2 (two) times daily before a meal. 30 capsule 3 12/10/2014 at Unknown time  . benzonatate (TESSALON) 200 MG capsule TAKE ONE CAPSULE BY MOUTH 3 TIMES A DAY AS NEEDED FOR COUGH  1   . isosorbide dinitrate (ISORDIL) 5 MG tablet Take 5 mg by mouth 2 (two) times daily.   10/27/2014 at 1800  . losartan (COZAAR) 25 MG tablet Take 1 tablet (25 mg total) by mouth daily. 90 tablet 1 10/27/2014 at Unknown time  . mirtazapine (REMERON) 15 MG tablet TAKE 1 TABLET BY MOUTH AT BEDTIME 30 tablet 2   . nitroGLYCERIN (NITROSTAT) 0.4 MG SL tablet Place 1 tablet (0.4 mg total) under the tongue every 5 (five) minutes as needed for chest pain. For chest pain 30 tablet 6 Past Week at Unknown time  . ondansetron  (ZOFRAN ODT) 4 MG disintegrating tablet Take 1 tablet (4 mg total) by mouth 2 (two) times daily. 20 tablet 0   . OXYGEN Inhale 2 L into the lungs at bedtime. May use daily as needed   10/28/2014 at Unknown time  . sertraline (ZOLOFT) 50 MG tablet Take 50 mg by mouth daily.  5 10/27/2014 at Unknown time  . spironolactone (ALDACTONE) 25 MG tablet Take 1 tablet (25 mg total) by mouth daily. 90 tablet 1 10/27/2014 at Unknown time   Scheduled: . allopurinol  100 mg Oral Daily  . aspirin EC  81 mg Oral Daily  . donepezil  5 mg Oral QHS  . enoxaparin (LOVENOX) injection  40 mg Subcutaneous Q24H  . ferrous sulfate  325 mg Oral Q breakfast  . furosemide  40 mg Intravenous BID  . isosorbide dinitrate  5 mg Oral BID  . losartan  25 mg Oral Daily  . mirtazapine  15 mg Oral QHS  . ondansetron  4 mg Oral BID  . pantoprazole  80 mg Oral Daily  . potassium chloride  20 mEq Oral BID  . sertraline  50 mg Oral Daily  . sodium chloride  3 mL Intravenous Q12H  . spironolactone  25  mg Oral Daily   Continuous:  DHR:CBULAG chloride, acetaminophen, ALPRAZolam, benzonatate, HYDROcodone-acetaminophen, HYDROcodone-homatropine, indomethacin, LORazepam, ondansetron (ZOFRAN) IV, sodium chloride  Assesment: He was admitted with acute on chronic systolic heart failure. His troponin level is elevated but he does not appear to be having acute coronary syndrome. He is better but still having significant cough and I have ordered something for cough. Considering his purulent sputum I'm going to add an antibiotic. He does not really have a history of COPD in the past. Active Problems:   HTN (hypertension)   Stage III chronic kidney disease   Acute on chronic systolic heart failure (HCC)   Elevated troponin    Plan: Continue current medications. His potassium is somewhat low and that will be treated. Cardiology consultation in the morning. Echocardiogram has been done. Doxycycline.    LOS: 1 day   Qunicy Higinbotham  L 12/11/2014, 10:47 AM

## 2014-12-11 NOTE — Progress Notes (Signed)
*  PRELIMINARY RESULTS* Echocardiogram 2D Echocardiogram has been performed.  Beryle Beams 12/11/2014, 10:03 AM

## 2014-12-12 ENCOUNTER — Encounter (HOSPITAL_COMMUNITY): Payer: Self-pay | Admitting: Adult Health

## 2014-12-12 DIAGNOSIS — N183 Chronic kidney disease, stage 3 (moderate): Secondary | ICD-10-CM

## 2014-12-12 DIAGNOSIS — I5023 Acute on chronic systolic (congestive) heart failure: Secondary | ICD-10-CM

## 2014-12-12 DIAGNOSIS — R7989 Other specified abnormal findings of blood chemistry: Secondary | ICD-10-CM

## 2014-12-12 LAB — BASIC METABOLIC PANEL
ANION GAP: 7 (ref 5–15)
BUN: 30 mg/dL — ABNORMAL HIGH (ref 6–20)
CHLORIDE: 109 mmol/L (ref 101–111)
CO2: 25 mmol/L (ref 22–32)
CREATININE: 1.49 mg/dL — AB (ref 0.61–1.24)
Calcium: 9.1 mg/dL (ref 8.9–10.3)
GFR calc non Af Amer: 43 mL/min — ABNORMAL LOW (ref >60)
GFR, EST AFRICAN AMERICAN: 49 mL/min — AB (ref >60)
Glucose, Bld: 108 mg/dL — ABNORMAL HIGH (ref 65–99)
POTASSIUM: 3.4 mmol/L — AB (ref 3.5–5.1)
SODIUM: 141 mmol/L (ref 135–145)

## 2014-12-12 MED ORDER — POTASSIUM CHLORIDE CRYS ER 20 MEQ PO TBCR
40.0000 meq | EXTENDED_RELEASE_TABLET | Freq: Once | ORAL | Status: AC
  Administered 2014-12-12: 40 meq via ORAL
  Filled 2014-12-12: qty 2

## 2014-12-12 MED ORDER — ALUM & MAG HYDROXIDE-SIMETH 200-200-20 MG/5ML PO SUSP
30.0000 mL | ORAL | Status: DC | PRN
Start: 2014-12-12 — End: 2014-12-13
  Administered 2014-12-12: 30 mL via ORAL
  Filled 2014-12-12: qty 30

## 2014-12-12 MED ORDER — CARVEDILOL 3.125 MG PO TABS
3.1250 mg | ORAL_TABLET | Freq: Two times a day (BID) | ORAL | Status: DC
  Administered 2014-12-12 (×2): 3.125 mg via ORAL
  Filled 2014-12-12 (×3): qty 1

## 2014-12-12 NOTE — Progress Notes (Signed)
Patient has been voiding into commode independently today.  Unable to measure output.  Reminded patient and asked him to use the urinal so that we can keep up with I&O's.  Patient and wife verbalize understanding - 2 urinals at patient's bedside within reach

## 2014-12-12 NOTE — Care Management Note (Signed)
Case Management Note  Patient Details  Name: Bernadette Tirella MRN: [id number] Date of Birth: 03-12-1932  Subjective/Objective:                  Pt admitted from home with CHF. Pt lives with his wife and will return home at discharge. Pt has a quad cane and home O2 with [employer] for home use.   Action/Plan: Pt may benefit from [facility] RN at discharge. Pt would also like 3N1 from [facility]. Romualdo Bolk of [facility] is aware and will collect the pts information from the chart. 3N1 wil be delivered to pts room prior to discharge.  Expected Discharge Date:                  Expected Discharge Plan:  Home/Self Care  In-House Referral:  NA  Discharge planning Services  CM Consult  Post Acute Care Choice:  Durable Medical Equipment Choice offered to:  Patient  DME Arranged:  3-N-1 DME Agency:  [place]:    [place] Agency:     Status of Service:  In process, will continue to follow  Medicare Important Message Given:    Date Medicare IM Given:    Medicare IM give by:    Date Additional Medicare IM Given:    Additional Medicare Important Message give by:     If discussed at [place] of Stay Meetings, dates discussed:    Additional Comments:  Joylene Draft, RN 12/12/2014, 1:06 PM

## 2014-12-12 NOTE — Progress Notes (Addendum)
Subjective: He says he feels much better. He has no new complaints. No chest pain. His breathing is much better.  Objective: Vital signs in last 24 hours: Temp:  [97.9 F (36.6 C)-98.3 F (36.8 C)] 97.9 F (36.6 C) (11/28 0424) Pulse Rate:  [83-97] 91 (11/28 0424) Resp:  [20] 20 (11/28 0424) BP: (118-122)/(72-83) 118/83 mmHg (11/28 0424) SpO2:  [95 %-100 %] 99 % (11/28 0424) Weight:  [70.444 kg (155 lb 4.8 oz)] 70.444 kg (155 lb 4.8 oz) (11/28 0355) Weight change: 2.404 kg (5 lb 4.8 oz) Last BM Date: 12/10/14  Intake/Output from previous day: 11/27 0701 - 11/28 0700 In: 480 [P.O.:480] Out: 200 [Urine:200]  PHYSICAL EXAM General appearance: alert, cooperative and no distress Resp: rales bibasilar Cardio: irregularly irregular rhythm GI: soft, non-tender; bowel sounds normal; no masses,  no organomegaly Extremities: extremities normal, atraumatic, no cyanosis or edema  Lab Results:  Results for orders placed or performed during the hospital encounter of 12/10/14 (from the past 48 hour(s))  Comprehensive metabolic panel     Status: Abnormal   Collection Time: 12/10/14  3:08 PM  Result Value Ref Range   Sodium 138 135 - 145 mmol/L   Potassium 3.1 (L) 3.5 - 5.1 mmol/L   Chloride 103 101 - 111 mmol/L   CO2 24 22 - 32 mmol/L   Glucose, Bld 106 (H) 65 - 99 mg/dL   BUN 30 (H) 6 - 20 mg/dL   Creatinine, Ser 1.94 (H) 0.61 - 1.24 mg/dL   Calcium 8.8 (L) 8.9 - 10.3 mg/dL   Total Protein 6.9 6.5 - 8.1 g/dL   Albumin 3.7 3.5 - 5.0 g/dL   AST 21 15 - 41 U/L   ALT 22 17 - 63 U/L   Alkaline Phosphatase 88 38 - 126 U/L   Total Bilirubin 0.7 0.3 - 1.2 mg/dL   GFR calc non Af Amer 31 (L) >60 mL/min   GFR calc Af Amer 36 (L) >60 mL/min    Comment: (NOTE) The eGFR has been calculated using the CKD EPI equation. This calculation has not been validated in all clinical situations. eGFR's persistently <60 mL/min signify possible Chronic Kidney Disease.    Anion gap 11 5 - 15  CBC WITH  DIFFERENTIAL     Status: Abnormal   Collection Time: 12/10/14  3:08 PM  Result Value Ref Range   WBC 6.1 4.0 - 10.5 K/uL   RBC 3.63 (L) 4.22 - 5.81 MIL/uL   Hemoglobin 11.0 (L) 13.0 - 17.0 g/dL   HCT 33.0 (L) 39.0 - 52.0 %   MCV 90.9 78.0 - 100.0 fL   MCH 30.3 26.0 - 34.0 pg   MCHC 33.3 30.0 - 36.0 g/dL   RDW 14.9 11.5 - 15.5 %   Platelets 207 150 - 400 K/uL   Neutrophils Relative % 73 %   Neutro Abs 4.4 1.7 - 7.7 K/uL   Lymphocytes Relative 17 %   Lymphs Abs 1.0 0.7 - 4.0 K/uL   Monocytes Relative 7 %   Monocytes Absolute 0.4 0.1 - 1.0 K/uL   Eosinophils Relative 3 %   Eosinophils Absolute 0.2 0.0 - 0.7 K/uL   Basophils Relative 0 %   Basophils Absolute 0.0 0.0 - 0.1 K/uL  Protime-INR     Status: None   Collection Time: 12/10/14  3:08 PM  Result Value Ref Range   Prothrombin Time 15.1 11.6 - 15.2 seconds   INR 1.17 0.00 - 1.49  Troponin I  Status: Abnormal   Collection Time: 12/10/14  3:08 PM  Result Value Ref Range   Troponin I 0.10 (H) <0.031 ng/mL    Comment:        PERSISTENTLY INCREASED TROPONIN VALUES IN THE RANGE OF 0.04-0.49 ng/mL CAN BE SEEN IN:       -UNSTABLE ANGINA       -CONGESTIVE HEART FAILURE       -MYOCARDITIS       -CHEST TRAUMA       -ARRYHTHMIAS       -LATE PRESENTING MYOCARDIAL INFARCTION       -COPD   CLINICAL FOLLOW-UP RECOMMENDED.   Brain natriuretic peptide     Status: Abnormal   Collection Time: 12/10/14  3:08 PM  Result Value Ref Range   B Natriuretic Peptide 2794.0 (H) 0.0 - 100.0 pg/mL  Type and screen     Status: None   Collection Time: 12/10/14  3:10 PM  Result Value Ref Range   ABO/RH(D) O POS    Antibody Screen NEG    Sample Expiration 76/81/1572   Basic metabolic panel     Status: Abnormal   Collection Time: 12/11/14  5:49 AM  Result Value Ref Range   Sodium 141 135 - 145 mmol/L   Potassium 3.3 (L) 3.5 - 5.1 mmol/L   Chloride 110 101 - 111 mmol/L   CO2 26 22 - 32 mmol/L   Glucose, Bld 105 (H) 65 - 99 mg/dL   BUN 31  (H) 6 - 20 mg/dL   Creatinine, Ser 1.68 (H) 0.61 - 1.24 mg/dL   Calcium 8.8 (L) 8.9 - 10.3 mg/dL   GFR calc non Af Amer 37 (L) >60 mL/min   GFR calc Af Amer 43 (L) >60 mL/min    Comment: (NOTE) The eGFR has been calculated using the CKD EPI equation. This calculation has not been validated in all clinical situations. eGFR's persistently <60 mL/min signify possible Chronic Kidney Disease.    Anion gap 5 5 - 15  Basic metabolic panel     Status: Abnormal   Collection Time: 12/12/14  6:32 AM  Result Value Ref Range   Sodium 141 135 - 145 mmol/L   Potassium 3.4 (L) 3.5 - 5.1 mmol/L   Chloride 109 101 - 111 mmol/L   CO2 25 22 - 32 mmol/L   Glucose, Bld 108 (H) 65 - 99 mg/dL   BUN 30 (H) 6 - 20 mg/dL   Creatinine, Ser 1.49 (H) 0.61 - 1.24 mg/dL   Calcium 9.1 8.9 - 10.3 mg/dL   GFR calc non Af Amer 43 (L) >60 mL/min   GFR calc Af Amer 49 (L) >60 mL/min    Comment: (NOTE) The eGFR has been calculated using the CKD EPI equation. This calculation has not been validated in all clinical situations. eGFR's persistently <60 mL/min signify possible Chronic Kidney Disease.    Anion gap 7 5 - 15    ABGS No results for input(s): PHART, PO2ART, TCO2, HCO3 in the last 72 hours.  Invalid input(s): PCO2 CULTURES No results found for this or any previous visit (from the past 240 hour(s)). Studies/Results: Dg Chest 2 View  12/10/2014  CLINICAL DATA:  Fatigue and shortness of breath EXAM: CHEST  2 VIEW COMPARISON:  01/24/2014 chest radiograph. FINDINGS: Stable configuration of 3 lead left subclavian ICD with lead tips in the right atrium and right ventricular apex. Stable cardiomediastinal silhouette with moderate cardiomegaly. No pneumothorax. Trace left pleural effusion. Mild pulmonary edema. IMPRESSION: 1.  Mild congestive heart failure. 2. Trace left pleural effusion. Electronically Signed   By: Ilona Sorrel M.D.   On: 12/10/2014 15:57    Medications:  Prior to Admission:  Prescriptions  prior to admission  Medication Sig Dispense Refill Last Dose  . acetaminophen (TYLENOL) 500 MG tablet Take 1,000 mg by mouth daily as needed for moderate pain.    unknown  . allopurinol (ZYLOPRIM) 100 MG tablet Take 1 tablet (100 mg total) by mouth daily. 90 tablet 1 12/10/2014 at Unknown time  . ALPRAZolam (XANAX) 0.25 MG tablet Take 0.25 mg by mouth 2 (two) times daily as needed for anxiety.   2 unknown at Unknown time  . Ascorbic Acid (VITAMIN C PO) Take 1 tablet by mouth daily.   12/10/2014 at Unknown time  . carvedilol (COREG) 25 MG tablet Take 1 tablet (25 mg total) by mouth 2 (two) times daily with a meal. 180 tablet 1 12/10/2014 at 800A  . Cholecalciferol (VITAMIN D PO) Take 2 tablets by mouth daily.   12/10/2014 at Unknown time  . donepezil (ARICEPT) 5 MG tablet Take 1 tablet (5 mg total) by mouth at bedtime. 30 tablet 12 12/09/2014 at Unknown time  . ferrous sulfate (FERROUSUL) 325 (65 FE) MG tablet Take 1 tablet (325 mg total) by mouth daily with breakfast. 30 tablet 3 12/10/2014 at Unknown time  . furosemide (LASIX) 40 MG tablet Take 1 tablet (40 mg total) by mouth 2 (two) times daily. 180 tablet 1 12/10/2014 at Unknown time  . HYDROcodone-acetaminophen (NORCO) 5-325 MG per tablet Take 2 tablets by mouth every 4 (four) hours as needed. (Patient taking differently: Take 2 tablets by mouth every 4 (four) hours as needed for moderate pain. ) 6 tablet 0 Past Week at Unknown time  . indomethacin (INDOCIN) 25 MG capsule Take 25 mg by mouth 2 (two) times daily as needed for mild pain or moderate pain.   4 12/10/2014 at Unknown time  . LORazepam (ATIVAN) 0.5 MG tablet Take 1 tablet (0.5 mg total) by mouth every 6 (six) hours as needed for anxiety. 50 tablet 5 unknown  . omeprazole (PRILOSEC) 20 MG capsule Take 1 capsule (20 mg total) by mouth 2 (two) times daily before a meal. 30 capsule 3 12/10/2014 at Unknown time  . benzonatate (TESSALON) 200 MG capsule TAKE ONE CAPSULE BY MOUTH 3 TIMES A DAY  AS NEEDED FOR COUGH  1   . isosorbide dinitrate (ISORDIL) 5 MG tablet Take 5 mg by mouth 2 (two) times daily.   10/27/2014 at 1800  . losartan (COZAAR) 25 MG tablet Take 1 tablet (25 mg total) by mouth daily. 90 tablet 1 10/27/2014 at Unknown time  . mirtazapine (REMERON) 15 MG tablet TAKE 1 TABLET BY MOUTH AT BEDTIME 30 tablet 2   . nitroGLYCERIN (NITROSTAT) 0.4 MG SL tablet Place 1 tablet (0.4 mg total) under the tongue every 5 (five) minutes as needed for chest pain. For chest pain 30 tablet 6 Past Week at Unknown time  . ondansetron (ZOFRAN ODT) 4 MG disintegrating tablet Take 1 tablet (4 mg total) by mouth 2 (two) times daily. 20 tablet 0   . OXYGEN Inhale 2 L into the lungs at bedtime. May use daily as needed   10/28/2014 at Unknown time  . sertraline (ZOLOFT) 50 MG tablet Take 50 mg by mouth daily.  5 10/27/2014 at Unknown time  . spironolactone (ALDACTONE) 25 MG tablet Take 1 tablet (25 mg total) by mouth daily. 90 tablet 1  10/27/2014 at Unknown time   Scheduled: . allopurinol  100 mg Oral Daily  . aspirin EC  81 mg Oral Daily  . carvedilol  3.125 mg Oral BID WC  . donepezil  5 mg Oral QHS  . doxycycline  100 mg Oral Q12H  . enoxaparin (LOVENOX) injection  40 mg Subcutaneous Q24H  . ferrous sulfate  325 mg Oral Q breakfast  . furosemide  40 mg Intravenous BID  . isosorbide dinitrate  5 mg Oral BID  . losartan  25 mg Oral Daily  . mirtazapine  15 mg Oral QHS  . ondansetron  4 mg Oral BID  . pantoprazole  80 mg Oral Daily  . potassium chloride  20 mEq Oral BID  . potassium chloride  40 mEq Oral Once  . sertraline  50 mg Oral Daily  . sodium chloride  3 mL Intravenous Q12H  . spironolactone  25 mg Oral Daily   Continuous:  JSC:BIPJRP chloride, acetaminophen, ALPRAZolam, benzonatate, HYDROcodone-acetaminophen, HYDROcodone-homatropine, indomethacin, LORazepam, ondansetron (ZOFRAN) IV, sodium chloride  Assesment: He has acute on chronic systolic heart failure. He is improved. He  had significant cough and congestion yesterday and that is better. Active Problems:   HTN (hypertension)   Stage III chronic kidney disease   Acute on chronic systolic heart failure (HCC)   Elevated troponin    Plan: Cardiology consultation today. His wife mentions potential discharge later today depending on cardiology opinion. He has improved substantially. However he is still on IV treatment    LOS: 2 days   Agnes Probert L 12/12/2014, 9:04 AM

## 2014-12-12 NOTE — Consult Note (Addendum)
CARDIOLOGY CONSULT NOTE   Patient ID: [name] Macias MRN: [id number] DOB/AGE: 10-03-1932 81 y.o.  Admit Date: 12/10/2014 Referring Physician: Sinda Du MD Primary Physician: Alonza Bogus, MD Consulting Cardiologist: Rozann Lesches MD Primary Cardiologist Cristopher Peru MD Reason for Consultation: CHF  Clinical Summary [name] is a medically complex 81 y.o.male with history of NICM with LVEF [id number], Chronic Systolic CHF s/p ICD implant with upgrade in 2011 (Medtronic), non-obstructive CAD with proximal diffuse stenosis of RCA, CKD, and anemia with admission in October of 2016 with colonic diverticular bleed requiring blood transfusions.   He presented to ER with increased dyspnea, and NYHA Class II-lll symptoms. States he ate some "salty food" for Thanksgiving. He began to have dyspnea the next day with some fluid retention in the lower extremities. He denies wt gain, in fact he has been losing wt for the last few months. He denies chest pain, irregular HR, or confusion.  On arrival to ER he had a BP of 94/51, HR 63, O2 sat of 91%. Afebrile. Potassium was 3.1, Creatinine 1.94, Hgb 11.0, Hct 33.0. Pro-BNP 2,794. Troponin 0.10, (unchanged from 05/17/2014). CXR revealed CHF with trace left pleural effusion. He was treated with lasix 40 mg and Zofran. He is on IV lasix 40 mg BID. He has had little diureses based on I/O documentation. Potassium has been repleated. Wt is down 2 lbs. Last recorded wt was in Dr.Rehman's office on 10/30/2014 and was documented at 170 lbs.   He is feeling and breathing better with diuresis but still feels weakness.   Allergies  Allergen Reactions  . Codeine Hives  . Enalapril Maleate Hives    hives  . Sulfonamide Derivatives Hives    Medications Scheduled Medications: . allopurinol  100 mg Oral Daily  . aspirin EC  81 mg Oral Daily  . carvedilol  3.125 mg Oral BID WC  . donepezil  5 mg Oral QHS  . doxycycline  100 mg Oral Q12H  . enoxaparin  (LOVENOX) injection  40 mg Subcutaneous Q24H  . ferrous sulfate  325 mg Oral Q breakfast  . furosemide  40 mg Intravenous BID  . isosorbide dinitrate  5 mg Oral BID  . losartan  25 mg Oral Daily  . mirtazapine  15 mg Oral QHS  . ondansetron  4 mg Oral BID  . pantoprazole  80 mg Oral Daily  . potassium chloride  20 mEq Oral BID  . sertraline  50 mg Oral Daily  . sodium chloride  3 mL Intravenous Q12H  . spironolactone  25 mg Oral Daily    PRN Medications: sodium chloride, acetaminophen, ALPRAZolam, benzonatate, HYDROcodone-acetaminophen, HYDROcodone-homatropine, indomethacin, LORazepam, ondansetron (ZOFRAN) IV, sodium chloride   Past Medical History  Diagnosis Date  . Arthritis   . Gout   . Non-ischemic cardiomyopathy ([place])   . Hypertension   . CAD (coronary artery disease) of artery bypass graft   . COPD (chronic obstructive pulmonary disease) ([place])   . History of syphilis   . Type 2 HSV infection of penis   . Erectile dysfunction   . Anxiety disorder   . Dementia   . GERD (gastroesophageal reflux disease)   . Ascending aortic aneurysm ([place])   . Lung nodule     RUL nodule noted on abdominal CT  . Automatic implantable cardioverter-defibrillator in situ 07/20/2008  . Chronic systolic heart failure ([place])   . Prostate cancer [facility])     Diagnosed in 01/2013 by Dr Michela Pitcher adenocarcinoma felt to be localized, gleason  score 6 with a pSA level of 8.4. No bone scan by Dr Michela Pitcher, referred to oncology, no radiation treatment offered    . CKD (chronic kidney disease) stage 3, GFR 30-59 ml/min     Past Surgical History  Procedure Laterality Date  . Ankle surgery Left   . Insertion of icd  02/19/05  . Crtd upgrade  12/14/2009    Medtronic  . Esophagogastroduodenoscopy N/A 07/01/2013    Procedure: ESOPHAGOGASTRODUODENOSCOPY (EGD);  Surgeon: Rogene Houston, MD;  [facility];  Service: Endoscopy;  Laterality: N/A;  145-moved to 1245 Ann notfied pt  . Esophagogastroduodenoscopy  N/A 10/28/2014    Procedure: ESOPHAGOGASTRODUODENOSCOPY (EGD);  Surgeon: Rogene Houston, MD;  Location: [facility];  Service: Endoscopy;  Laterality: N/A;  . Colonoscopy N/A 10/29/2014    Procedure: COLONOSCOPY;  Surgeon: Rogene Houston, MD;  Location: [facility];  Service: Endoscopy;  Laterality: N/A;    Family History  Problem Relation Age of Onset  . Heart failure Father   . Prostate cancer Brother   . Diabetes Brother   . Hypertension Brother   . Heart failure Brother     Social History [name] reports that he quit smoking about 51 years ago. His smoking use included Cigarettes. He has a 10 pack-year smoking history. He has never used smokeless tobacco. [name] reports that he does not drink alcohol.  Review of Systems Complete review of systems are found to be negative unless outlined in H&P above. No fevers or chills, no cough.  Physical Examination Blood pressure 112/71, pulse 90, temperature 97.9 F (36.6 C), temperature source Oral, resp. rate 20, height 5\' 7"  (1.702 m), weight 155 lb 4.8 oz (70.444 kg), SpO2 93 %.  Intake/Output Summary (Last 24 hours) at 12/12/14 1034 Last data filed at 12/12/14 0200  Gross per 24 hour  Intake    480 ml  Output    200 ml  Net    280 ml    Telemetry: Paced rhythm with PVC';s and PAC's   GEN: Elderly male in no acute distress HEENT: Conjunctiva and lids normal, oropharynx clear. Neck: Supple, Mild elevated JVP at 90 degrees, or carotid bruits, no thyromegaly. Lungs: Bibasilar crackles are noted, R>L  Cardiac: Irregular rhythm, distant heart sounds, no S3 or significant systolic murmur, no pericardial rub. Abdomen: Soft, nontender, bowel sounds present, no guarding or rebound. Extremities: No pitting edema in dependent position, distal pulses 1+ cool extremities Skin: Warm and dry. Musculoskeletal: No kyphosis. Neuropsychiatric: Alert and oriented x3, affect grossly appropriate.  Prior Cardiac  Testing/Procedures  Echocardigram 12/11/2014 Left ventricle: The cavity size was severely dilated. Wall thickness was normal. The estimated ejection fraction was in the range of 10% to 15%. Diffuse hypokinesis. Doppler parameters are consistent with restrictive physiology, indicative of decreased left ventricular diastolic compliance and/or increased left atrial pressure. Internal dimension, ED (PLAX chordal): 82 mm. - Aortic valve: There was mild to moderate regurgitation. The AI VC is 0.4 cm. Valve area (VTI): 2.94 cm^2. Valve area (Vmax): 3.3 cm^2. Regurgitation pressure half-time: 448 ms. - Aorta: Ascending aortic diameter: 41 mm (S). The proximal ascending aorta is mildly dilated. Please note that only the proximal portion of the ascending aorta is visualized. - Mitral valve: There was moderate regurgitation. The MR VC is 0.4 cm. - Left atrium: The atrium was severely dilated. - Right ventricle: The cavity size was mildly to moderately dilated. - Right atrium: The atrium was severely dilated. - Atrial septum: No  defect or patent foramen ovale was identified. - Tricuspid valve: There was moderate regurgitation. - Pulmonary arteries: Systolic pressure was moderately increased. PA peak pressure: 64 mm Hg (S). - Inferior vena cava: The vessel was dilated. The respirophasic diameter changes were blunted (< 50%), consistent with elevated central venous pressure. - Technically adequate study.  Cardiac Cath-Per notes 2004  NSELECTIVE CORONARY ANGIOGRAPHY: 1. The left main coronary artery is a large caliber vessels. No flow-  limiting coronary artery disease 2. The left anterior descending artery is a large caliber vessel without  flow-limiting disease, including diagnosis branches. 3. The circumflex coronary artery again is a large caliber vessel with no  flow-limiting disease. 4. The right coronary artery had a proximal diffuse 40%  stenosis, somewhat  improved after injection of intracoronary nitroglycerin. There were no  other focal lesions.  RECOMMENDATIONS: Further medical therapy is appropriate for nonischemic cardiomyopathy. This would include ACE inhibitor, add digoxin and beta- blocker therapy.  Lab Results  Basic Metabolic Panel:  Recent Labs Lab 12/10/14 1508 12/11/14 0549 12/12/14 0632  NA 138 141 141  K 3.1* 3.3* 3.4*  CL 103 110 109  CO2 24 26 25   GLUCOSE 106* 105* 108*  BUN 30* 31* 30*  CREATININE 1.94* 1.68* 1.49*  CALCIUM 8.8* 8.8* 9.1    Liver Function Tests:  Recent Labs Lab 12/10/14 1508  AST 21  ALT 22  ALKPHOS 88  BILITOT 0.7  PROT 6.9  ALBUMIN 3.7    CBC:  Recent Labs Lab 12/10/14 1508  WBC 6.1  NEUTROABS 4.4  HGB 11.0*  HCT 33.0*  MCV 90.9  PLT 207    Cardiac Enzymes:  Recent Labs Lab 12/10/14 1508  TROPONINI 0.10*    Radiology: Dg Chest 2 View  12/10/2014  CLINICAL DATA:  Fatigue and shortness of breath EXAM: CHEST  2 VIEW COMPARISON:  01/24/2014 chest radiograph. FINDINGS: Stable configuration of 3 lead left subclavian ICD with lead tips in the right atrium and right ventricular apex. Stable cardiomediastinal silhouette with moderate cardiomegaly. No pneumothorax. Trace left pleural effusion. Mild pulmonary edema. IMPRESSION: 1. Mild congestive heart failure. 2. Trace left pleural effusion. Electronically Signed   By: Ilona Sorrel M.D.   On: 12/10/2014 15:57    ECG: Paced rhythm with PVC's rate of 68 bpm.   Impression and Recommendations  1. Acute on Chronic Systolic CHF: Denies medical non-compliance but admits to eating salty foods over Thanksgiving holiday. He has been losing wt, (down 15-20 lbs) since last weighed and therefore, dry wt is uncertain. He has had little diureses documented on IV lasix 40 mg BID - 2 lbs wt loss. Potassium is improved but not optimal. Will give extra dose today. Careful monitoring on spironolactone. He  is overall improved symptomatically. He is not on BB as prophylaxis for VT and heart rate control. Will add low dose coreg 3.125 BID.  2.NICM: LVEF of 10-15% per echo completed this admission. Continue medical therapy including ARB, Isordil, spironolactone. Low dose Coreg added.  3. ICD in situ: Medronic. Last interrogation in June of 2016. Denies ICD shocks.   Signed: Phill Myron. Lawrence NP [place]  12/12/2014, 10:34 AM Co-Sign MD  Attending note:  Patient seen and examined. Reviewed records and modified above note by Ms. Lawrence NP. [name] presents with generalized weakness and also a feeling of shortness of breath and abdominal bloating with transient leg edema. Although he has been more short of breath with abdominal bloating since increased sodium intake over the Thanksgiving  holiday, he states that he felt like he never quite bounced back from hospital stay back in October with GI bleeding and anemia. He reports compliance with his medications which are outlined above. He denies any palpitations or device discharges. On examination he is hemodynamically stable with systolic blood pressure in the 110 to 115 range, heart rate in the 80s with sinus rhythm, PVCs and brief NSVT noted by telemetry. He is a frail-appearing elderly male in no distress, lungs exhibit diminished breath sounds at the very base, scattered rhonchi, no wheezing. Cardiac exam reveals RRR with ectopy, soft S3, abdomen is soft and nontender, extremities exhibit trace ankle edema. He has been placed on IV Lasix, creatinine is 1.4 which is down from 1.9, BNP is 2794. Minor troponin I elevation is most consistent with CHF at this point. He has not diuresed significantly as yet. Recent echocardiogram is noted above showing LVEF 10-15% with restrictive diastolic filling pattern, also RV dysfunction with pulmonary hypertension. Agree with the addition of low-dose Coreg, continue IV Lasix for now along with Cozaar, Isordil, and  Aldactone. Follow-up BMET a.m.  Satira Sark, M.D., F.A.C.C.

## 2014-12-13 LAB — BASIC METABOLIC PANEL
ANION GAP: 7 (ref 5–15)
BUN: 33 mg/dL — AB (ref 6–20)
CALCIUM: 9.4 mg/dL (ref 8.9–10.3)
CO2: 26 mmol/L (ref 22–32)
Chloride: 108 mmol/L (ref 101–111)
Creatinine, Ser: 1.69 mg/dL — ABNORMAL HIGH (ref 0.61–1.24)
GFR calc Af Amer: 42 mL/min — ABNORMAL LOW (ref >60)
GFR calc non Af Amer: 37 mL/min — ABNORMAL LOW (ref >60)
GLUCOSE: 97 mg/dL (ref 65–99)
Potassium: 3.9 mmol/L (ref 3.5–5.1)
Sodium: 141 mmol/L (ref 135–145)

## 2014-12-13 MED ORDER — DOXYCYCLINE HYCLATE 100 MG PO TABS: 100.0000 mg | ORAL_TABLET | Freq: Two times a day (BID) | ORAL | Status: DC

## 2014-12-13 MED ORDER — CARVEDILOL 3.125 MG PO TABS: 6.2500 mg | ORAL_TABLET | Freq: Two times a day (BID) | ORAL | Status: DC

## 2014-12-13 MED ORDER — BENZONATATE 100 MG PO CAPS: 200.0000 mg | ORAL_CAPSULE | Freq: Three times a day (TID) | ORAL | Status: DC

## 2014-12-13 MED ORDER — CARVEDILOL 6.25 MG PO TABS: 6.2500 mg | ORAL_TABLET | Freq: Two times a day (BID) | ORAL | Status: DC

## 2014-12-13 NOTE — Progress Notes (Signed)
IV removed, site WNL.  Pt given d/c instructions and new prescriptions.  Discussed all home medications (when, how, and why to take), patient verbalizes understanding. Pt and wife verbalize understanding of change in dose of Coreg. Discussed home care with patient, teachback completed. F/U appointment in place, pt states they will keep appointment. Pt is stable at this time waiting on family member to pick him up.

## 2014-12-13 NOTE — Consult Note (Signed)
   [facility] CM Inpatient Consult   12/13/2014  Advait Mccoig 11-Aug-1932 [id number]   Spoke with patient and wife at bedside regarding [facility] services. Patient does not want to sign up with [facility] at this time. Of note, he is being discharged later today. Patient given [facility] brochure and contact information for future reference, requested that they call our main office line if they decided they would like to participate at a later time.  Of note, [facility] Care Management services would not replace or interfere with any services that are arranged by inpatient case management or social work.  For additional questions or referrals please contact:  Royetta Crochet. Laymond Purser, RN, BSN, [facility] Liaison [phone]

## 2014-12-13 NOTE — Care Management Important Message (Signed)
Important Message  Patient Details  Name: [name] Macias MRN: [id number] Date of Birth: February 21, 1932   Medicare Important Message Given:  Yes    Joylene Draft, RN 12/13/2014, 9:36 AM

## 2014-12-13 NOTE — Progress Notes (Signed)
Primary cardiologist: Dr. Cristopher Peru  Seen for followup: Acute on chronic systolic CHF  Subjective:    Feels better today, not short of breath or coughing, wants to go home.  Objective:   Temp:  [98 F (36.7 C)] 98 F (36.7 C) (11/29 0609) Pulse Rate:  [81-104] 104 (11/29 0609) Resp:  [20] 20 (11/29 0609) BP: (111-118)/(65-84) 118/84 mmHg (11/29 0609) SpO2:  [93 %-97 %] 97 % (11/29 0609) Weight:  [155 lb 4.8 oz (70.444 kg)] 155 lb 4.8 oz (70.444 kg) (11/29 0609) Last BM Date: 12/12/14  Filed Weights   12/11/14 0519 12/12/14 0355 12/13/14 0609  Weight: 157 lb 1.6 oz (71.26 kg) 155 lb 4.8 oz (70.444 kg) 155 lb 4.8 oz (70.444 kg)    Intake/Output Summary (Last 24 hours) at 12/13/14 [id number] Last data filed at 12/12/14 1730  Gross per 24 hour  Intake    480 ml  Output      0 ml  Net    480 ml    Telemetry: Sinus rhythm and sinus tachycardia with bursts of NSVT.  Exam:  General: Elderly male, no distress.  Lungs: Decreased breath sounds, no active wheezing.  Cardiac: RRR with ectopy, soft S3.  Extremities: No pitting edema.  Lab Results:  Basic Metabolic Panel:  Recent Labs Lab 12/11/14 0549 12/12/14 0632 12/13/14 0708  NA 141 141 141  K 3.3* 3.4* 3.9  CL 110 109 108  CO2 26 25 26   GLUCOSE 105* 108* 97  BUN 31* 30* 33*  CREATININE 1.68* 1.49* 1.69*  CALCIUM 8.8* 9.1 9.4    Liver Function Tests:  Recent Labs Lab 12/10/14 1508  AST 21  ALT 22  ALKPHOS 88  BILITOT 0.7  PROT 6.9  ALBUMIN 3.7    CBC:  Recent Labs Lab 12/10/14 1508  WBC 6.1  HGB 11.0*  HCT 33.0*  MCV 90.9  PLT 207    Cardiac Enzymes:  Recent Labs Lab 12/10/14 1508  TROPONINI 0.10*    Medications:   Scheduled Medications: . allopurinol  100 mg Oral Daily  . aspirin EC  81 mg Oral Daily  . carvedilol  3.125 mg Oral BID WC  . donepezil  5 mg Oral QHS  . doxycycline  100 mg Oral Q12H  . enoxaparin (LOVENOX) injection  40 mg Subcutaneous Q24H  . ferrous  sulfate  325 mg Oral Q breakfast  . furosemide  40 mg Intravenous BID  . isosorbide dinitrate  5 mg Oral BID  . losartan  25 mg Oral Daily  . mirtazapine  15 mg Oral QHS  . ondansetron  4 mg Oral BID  . pantoprazole  80 mg Oral Daily  . potassium chloride  20 mEq Oral BID  . sertraline  50 mg Oral Daily  . sodium chloride  3 mL Intravenous Q12H  . spironolactone  25 mg Oral Daily     PRN Medications:  sodium chloride, acetaminophen, ALPRAZolam, alum & mag hydroxide-simeth, benzonatate, HYDROcodone-acetaminophen, HYDROcodone-homatropine, indomethacin, LORazepam, ondansetron (ZOFRAN) IV, sodium chloride   Assessment:   1. Acute on chronic systolic heart failure. Intake and output inaccurate so difficult to track overall diuresis. He feels clinically better and would like to go home today. Current weight 155 pounds. We discussed sodium restriction and maintenance of regular medical therapy.  2. Nonischemic cardiomyopathy with LVEF 10-15%, status post [employer] ICD followed by Dr. Lovena Le. No device shocks. He has had brief bursts of NSVT by telemetry. Beta blocker added.  3. CKD stage  3, creatinine 1.6.  Plan/Discussion:    Discussed with patient and Dr. Luan Pulling. He is being discharged home today. Continue current medical regimen except increase Coreg to 6.25 mg twice daily. Follow-up arranged in 2 weeks for clinical reevaluation with nurse practitioner.  Satira Sark, M.D., F.A.C.C.

## 2014-12-13 NOTE — Progress Notes (Signed)
Subjective: He says he feels well. He wants to go home. His intake and output is inaccurate  Objective: Vital signs in last 24 hours: Temp:  [98 F (36.7 C)] 98 F (36.7 C) (11/29 0609) Pulse Rate:  [81-104] 104 (11/29 0609) Resp:  [20] 20 (11/29 0609) BP: (111-118)/(65-84) 118/84 mmHg (11/29 0609) SpO2:  [93 %-97 %] 97 % (11/29 0609) Weight:  [70.444 kg (155 lb 4.8 oz)] 70.444 kg (155 lb 4.8 oz) (11/29 0609) Weight change: 0 kg (0 lb) Last BM Date: 12/12/14  Intake/Output from previous day: 11/28 0701 - 11/29 0700 In: 720 [P.O.:720] Out: -   PHYSICAL EXAM General appearance: alert, cooperative and no distress Resp: clear to auscultation bilaterally Cardio: regular rate and rhythm, S1, S2 normal, no murmur, click, rub or gallop GI: soft, non-tender; bowel sounds normal; no masses,  no organomegaly Extremities: extremities normal, atraumatic, no cyanosis or edema  Lab Results:  Results for orders placed or performed during the hospital encounter of 12/10/14 (from the past 48 hour(s))  Basic metabolic panel     Status: Abnormal   Collection Time: 12/12/14  6:32 AM  Result Value Ref Range   Sodium 141 135 - 145 mmol/L   Potassium 3.4 (L) 3.5 - 5.1 mmol/L   Chloride 109 101 - 111 mmol/L   CO2 25 22 - 32 mmol/L   Glucose, Bld 108 (H) 65 - 99 mg/dL   BUN 30 (H) 6 - 20 mg/dL   Creatinine, Ser 1.49 (H) 0.61 - 1.24 mg/dL   Calcium 9.1 8.9 - 10.3 mg/dL   GFR calc non Af Amer 43 (L) >60 mL/min   GFR calc Af Amer 49 (L) >60 mL/min    Comment: (NOTE) The eGFR has been calculated using the CKD EPI equation. This calculation has not been validated in all clinical situations. eGFR's persistently <60 mL/min signify possible Chronic Kidney Disease.    Anion gap 7 5 - 15  Basic metabolic panel     Status: Abnormal   Collection Time: 12/13/14  7:08 AM  Result Value Ref Range   Sodium 141 135 - 145 mmol/L   Potassium 3.9 3.5 - 5.1 mmol/L   Chloride 108 101 - 111 mmol/L   CO2 26 22  - 32 mmol/L   Glucose, Bld 97 65 - 99 mg/dL   BUN 33 (H) 6 - 20 mg/dL   Creatinine, Ser 1.69 (H) 0.61 - 1.24 mg/dL   Calcium 9.4 8.9 - 10.3 mg/dL   GFR calc non Af Amer 37 (L) >60 mL/min   GFR calc Af Amer 42 (L) >60 mL/min    Comment: (NOTE) The eGFR has been calculated using the CKD EPI equation. This calculation has not been validated in all clinical situations. eGFR's persistently <60 mL/min signify possible Chronic Kidney Disease.    Anion gap 7 5 - 15    ABGS No results for input(s): PHART, PO2ART, TCO2, HCO3 in the last 72 hours.  Invalid input(s): PCO2 CULTURES No results found for this or any previous visit (from the past 240 hour(s)). Studies/Results: No results found.  Medications:  Prior to Admission:  Prescriptions prior to admission  Medication Sig Dispense Refill Last Dose  . acetaminophen (TYLENOL) 500 MG tablet Take 1,000 mg by mouth daily as needed for moderate pain.    unknown  . allopurinol (ZYLOPRIM) 100 MG tablet Take 1 tablet (100 mg total) by mouth daily. 90 tablet 1 12/10/2014 at Unknown time  . ALPRAZolam (XANAX) 0.25 MG tablet Take  0.25 mg by mouth 2 (two) times daily as needed for anxiety.   2 unknown at Unknown time  . Ascorbic Acid (VITAMIN C PO) Take 1 tablet by mouth daily.   12/10/2014 at Unknown time  . carvedilol (COREG) 25 MG tablet Take 1 tablet (25 mg total) by mouth 2 (two) times daily with a meal. 180 tablet 1 12/10/2014 at 800A  . Cholecalciferol (VITAMIN D PO) Take 2 tablets by mouth daily.   12/10/2014 at Unknown time  . donepezil (ARICEPT) 5 MG tablet Take 1 tablet (5 mg total) by mouth at bedtime. 30 tablet 12 12/09/2014 at Unknown time  . ferrous sulfate (FERROUSUL) 325 (65 FE) MG tablet Take 1 tablet (325 mg total) by mouth daily with breakfast. 30 tablet 3 12/10/2014 at Unknown time  . furosemide (LASIX) 40 MG tablet Take 1 tablet (40 mg total) by mouth 2 (two) times daily. 180 tablet 1 12/10/2014 at Unknown time  .  HYDROcodone-acetaminophen (NORCO) 5-325 MG per tablet Take 2 tablets by mouth every 4 (four) hours as needed. (Patient taking differently: Take 2 tablets by mouth every 4 (four) hours as needed for moderate pain. ) 6 tablet 0 Past Week at Unknown time  . indomethacin (INDOCIN) 25 MG capsule Take 25 mg by mouth 2 (two) times daily as needed for mild pain or moderate pain.   4 12/10/2014 at Unknown time  . isosorbide dinitrate (ISORDIL) 5 MG tablet Take 5 mg by mouth 2 (two) times daily.   12/11/2014 at Unknown time  . LORazepam (ATIVAN) 0.5 MG tablet Take 1 tablet (0.5 mg total) by mouth every 6 (six) hours as needed for anxiety. 50 tablet 5 unknown  . losartan (COZAAR) 25 MG tablet Take 1 tablet (25 mg total) by mouth daily. 90 tablet 1 12/11/2014 at Unknown time  . nitroGLYCERIN (NITROSTAT) 0.4 MG SL tablet Place 1 tablet (0.4 mg total) under the tongue every 5 (five) minutes as needed for chest pain. For chest pain 30 tablet 6 unknown  . omeprazole (PRILOSEC) 20 MG capsule Take 1 capsule (20 mg total) by mouth 2 (two) times daily before a meal. 30 capsule 3 12/10/2014 at Unknown time  . ondansetron (ZOFRAN ODT) 4 MG disintegrating tablet Take 1 tablet (4 mg total) by mouth 2 (two) times daily. 20 tablet 0 12/11/2014 at Unknown time  . sertraline (ZOLOFT) 50 MG tablet Take 50 mg by mouth daily.  5 12/11/2014 at Unknown time  . spironolactone (ALDACTONE) 25 MG tablet Take 1 tablet (25 mg total) by mouth daily. 90 tablet 1 12/11/2014 at Unknown time  . temazepam (RESTORIL) 30 MG capsule Take 30 mg by mouth at bedtime.   12/11/2014 at Unknown time  . mirtazapine (REMERON) 15 MG tablet TAKE 1 TABLET BY MOUTH AT BEDTIME (Patient not taking: Reported on 12/12/2014) 30 tablet 2 Not Taking at Unknown time  . OXYGEN Inhale 2 L into the lungs at bedtime. May use daily as needed   10/28/2014 at Unknown time   Scheduled: . allopurinol  100 mg Oral Daily  . aspirin EC  81 mg Oral Daily  . carvedilol  3.125 mg  Oral BID WC  . donepezil  5 mg Oral QHS  . doxycycline  100 mg Oral Q12H  . enoxaparin (LOVENOX) injection  40 mg Subcutaneous Q24H  . ferrous sulfate  325 mg Oral Q breakfast  . furosemide  40 mg Intravenous BID  . isosorbide dinitrate  5 mg Oral BID  . losartan  25 mg Oral Daily  . mirtazapine  15 mg Oral QHS  . ondansetron  4 mg Oral BID  . pantoprazole  80 mg Oral Daily  . potassium chloride  20 mEq Oral BID  . sertraline  50 mg Oral Daily  . sodium chloride  3 mL Intravenous Q12H  . spironolactone  25 mg Oral Daily   Continuous:  ZPH:XTAVWP chloride, acetaminophen, ALPRAZolam, alum & mag hydroxide-simeth, benzonatate, HYDROcodone-acetaminophen, HYDROcodone-homatropine, indomethacin, LORazepam, ondansetron (ZOFRAN) IV, sodium chloride  Assesment: He was admitted with acute on chronic systolic heart failure and COPD exacerbation. He is better. He has chronic renal failure stage III at baseline. His intake and output is not accurate. Active Problems:   HTN (hypertension)   Stage III chronic kidney disease   Acute on chronic systolic heart failure (HCC)   Elevated troponin    Plan: Potential discharge later today. I would like to be sure that his cardiologist is okay with discharge at this point    LOS: 3 days   Cayetano Mikita L 12/13/2014, 8:59 AM

## 2014-12-13 NOTE — Care Management Note (Signed)
Case Management Note  Patient Details  Name: [name] Macias MRN: [id number] Date of Birth: 1932-01-16  Subjective/Objective:                    Action/Plan:   Expected Discharge Date:                  Expected Discharge Plan:  [name]  In-House Referral:  NA  Discharge planning Services  CM Consult  Post Acute Care Choice:  Durable Medical Equipment, Resumption of Svcs/PTA Provider Choice offered to:  Patient  DME Arranged:  3-N-1 DME Agency:  [place]:  RN [facility] Agency:  [place]  Status of Service:  Completed, signed off  Medicare Important Message Given:  Yes Date Medicare IM Given:    Medicare IM give by:    Date Additional Medicare IM Given:    Additional Medicare Important Message give by:     If discussed at [place] of Stay Meetings, dates discussed:    Additional Comments: Pt is active with [facility] RN (per [facility]). Pt would like to continue at discharge. Romualdo Bolk of [facility] is aware and will collect the pts information from the chart. [place] services to resume within 48 hours of discharge. Pt has 3N1 in room delivered by [facility]. Pt and pts nurse aware of discharge arrangement. [name] [place], RN 12/13/2014, 9:43 AM

## 2014-12-17 NOTE — Discharge Summary (Signed)
Physician Discharge Summary  Patient ID: [name] Macias MRN: [id number] DOB/AGE: [birth year] 81 y.o. Primary Care Physician:Giovanna Kemmerer L, MD Admit date: 12/10/2014 Discharge date: 12/17/2014    Discharge Diagnoses:   Active Problems:   HTN (hypertension)   Stage III chronic kidney disease   Acute on chronic systolic heart failure (HCC)   Elevated troponin  dementia Anxiety COPD exacerbation    Medication List    TAKE these medications        acetaminophen 500 MG tablet  Commonly known as:  TYLENOL  Take 1,000 mg by mouth daily as needed for moderate pain.     allopurinol 100 MG tablet  Commonly known as:  ZYLOPRIM  Take 1 tablet (100 mg total) by mouth daily.     ALPRAZolam 0.25 MG tablet  Commonly known as:  XANAX  Take 0.25 mg by mouth 2 (two) times daily as needed for anxiety.     benzonatate 100 MG capsule  Commonly known as:  TESSALON PERLES  Take 2 capsules (200 mg total) by mouth 3 (three) times daily.     carvedilol 6.25 MG tablet  Commonly known as:  COREG  Take 1 tablet (6.25 mg total) by mouth 2 (two) times daily with a meal.     donepezil 5 MG tablet  Commonly known as:  ARICEPT  Take 1 tablet (5 mg total) by mouth at bedtime.     doxycycline 100 MG tablet  Commonly known as:  VIBRA-TABS  Take 1 tablet (100 mg total) by mouth every 12 (twelve) hours.     ferrous sulfate 325 (65 FE) MG tablet  Commonly known as:  FERROUSUL  Take 1 tablet (325 mg total) by mouth daily with breakfast.     furosemide 40 MG tablet  Commonly known as:  LASIX  Take 1 tablet (40 mg total) by mouth 2 (two) times daily.     HYDROcodone-acetaminophen 5-325 MG tablet  Commonly known as:  NORCO  Take 2 tablets by mouth every 4 (four) hours as needed.     indomethacin 25 MG capsule  Commonly known as:  INDOCIN  Take 25 mg by mouth 2 (two) times daily as needed for mild pain or moderate pain.     isosorbide dinitrate 5 MG tablet  Commonly known as:  ISORDIL  Take 5  mg by mouth 2 (two) times daily.     LORazepam 0.5 MG tablet  Commonly known as:  ATIVAN  Take 1 tablet (0.5 mg total) by mouth every 6 (six) hours as needed for anxiety.     losartan 25 MG tablet  Commonly known as:  COZAAR  Take 1 tablet (25 mg total) by mouth daily.     mirtazapine 15 MG tablet  Commonly known as:  REMERON  TAKE 1 TABLET BY MOUTH AT BEDTIME     nitroGLYCERIN 0.4 MG SL tablet  Commonly known as:  NITROSTAT  Place 1 tablet (0.4 mg total) under the tongue every 5 (five) minutes as needed for chest pain. For chest pain     omeprazole 20 MG capsule  Commonly known as:  PRILOSEC  Take 1 capsule (20 mg total) by mouth 2 (two) times daily before a meal.     ondansetron 4 MG disintegrating tablet  Commonly known as:  ZOFRAN ODT  Take 1 tablet (4 mg total) by mouth 2 (two) times daily.     OXYGEN  Inhale 2 L into the lungs at bedtime. May use daily as needed  sertraline 50 MG tablet  Commonly known as:  ZOLOFT  Take 50 mg by mouth daily.     spironolactone 25 MG tablet  Commonly known as:  ALDACTONE  Take 1 tablet (25 mg total) by mouth daily.     temazepam 30 MG capsule  Commonly known as:  RESTORIL  Take 30 mg by mouth at bedtime.     VITAMIN C PO  Take 1 tablet by mouth daily.     VITAMIN D PO  Take 2 tablets by mouth daily.        Discharged Condition: Improved    Consults: Cardiology  Significant Diagnostic Studies: Dg Chest 2 View  12/10/2014  CLINICAL DATA:  Fatigue and shortness of breath EXAM: CHEST  2 VIEW COMPARISON:  01/24/2014 chest radiograph. FINDINGS: Stable configuration of 3 lead left subclavian ICD with lead tips in the right atrium and right ventricular apex. Stable cardiomediastinal silhouette with moderate cardiomegaly. No pneumothorax. Trace left pleural effusion. Mild pulmonary edema. IMPRESSION: 1. Mild congestive heart failure. 2. Trace left pleural effusion. Electronically Signed   By: Ilona Sorrel M.D.   On:  12/10/2014 15:57    Lab Results: Basic Metabolic Panel: No results for input(s): NA, K, CL, CO2, GLUCOSE, BUN, CREATININE, CALCIUM, MG, PHOS in the last 72 hours. Liver Function Tests: No results for input(s): AST, ALT, ALKPHOS, BILITOT, PROT, ALBUMIN in the last 72 hours.   CBC: No results for input(s): WBC, NEUTROABS, HGB, HCT, MCV, PLT in the last 72 hours.  No results found for this or any previous visit (from the past 240 hour(s)).   Hospital Course: This is an 81 year old with a long known history of severe systolic heart failure who came to the emergency department with increasing shortness of breath. He also had cough. He was treated for heart failure and also treated for COPD exacerbation and improved. His medications were adjusted. At the time of discharge she was back to baseline. He does have dementia which is complicating his course.  Discharge Exam: Blood pressure 118/84, pulse 104, temperature 98 F (36.7 C), temperature source Oral, resp. rate 20, height 5\' 7"  (1.702 m), weight 70.444 kg (155 lb 4.8 oz), SpO2 97 %. He is awake and alert. His chest is clear. His heart is irregular. Abdomen is soft  Disposition: Home with home health services      Discharge Instructions    Discharge patient    Complete by:  As directed      Face-to-face encounter (required for Medicare/Medicaid patients)    Complete by:  As directed   I Gabriellia Rempel L certify that this patient is under my care and that I, or a nurse practitioner or physician's assistant working with me, had a face-to-face encounter that meets the physician face-to-face encounter requirements with this patient on 12/13/2014. The encounter with the patient was in whole, or in part for the following medical condition(s) which is the primary reason for home health care (List medical condition): chf/copd  The encounter with the patient was in whole, or in part, for the following medical condition, which is the primary reason  for home health care:  chf/copd  I certify that, based on my findings, the following services are medically necessary home health services:  Nursing  Reason for Medically Necessary Home Health Services:  Skilled Nursing- Change/Decline in Patient Status  My clinical findings support the need for the above services:  Shortness of breath with activity  Further, I certify that my clinical findings support  that this patient is homebound due to:  Shortness of Breath with activity     Home Health    Complete by:  As directed   To provide the following care/treatments:  RN           Follow-up Information    Follow up with Jory Sims, NP On 12/20/2014.   Specialties:  Nurse Practitioner, Radiology, Cardiology   Why:  at 1:00 pm at the [facility] office   Contact information:   [place] [address] [phone]       Follow up with [facility].   Contact information:   [place] [address] [phone]       Signed: Alonza Bogus   12/17/2014, 11:03 AM

## 2014-12-20 ENCOUNTER — Encounter: Payer: Self-pay | Admitting: Adult Health

## 2014-12-20 ENCOUNTER — Ambulatory Visit (INDEPENDENT_AMBULATORY_CARE_PROVIDER_SITE_OTHER): Payer: Commercial Managed Care - HMO | Admitting: Adult Health

## 2014-12-20 VITALS — BP 98/68 | HR 70 | Ht 67.0 in | Wt 154.6 lb

## 2014-12-20 DIAGNOSIS — I10 Essential (primary) hypertension: Secondary | ICD-10-CM

## 2014-12-20 DIAGNOSIS — I420 Dilated cardiomyopathy: Secondary | ICD-10-CM

## 2014-12-20 DIAGNOSIS — Z95810 Presence of automatic (implantable) cardiac defibrillator: Secondary | ICD-10-CM

## 2014-12-20 NOTE — Patient Instructions (Signed)
Your physician recommends that you schedule a follow-up appointment in: 3 months with Arnold Long NP  Your physician recommends that you continue on your current medications as directed. Please refer to the Current Medication list given to you today.     Please get lab work [employer] BEFORE NEXT VISIT IN [facility]   If you need a refill on your cardiac medications before your next appointment, please call your pharmacy.   Thank you for choosing [facility] !

## 2014-12-20 NOTE — Progress Notes (Signed)
Cardiology Office Note   Date:  12/20/2014   ID:  Gwenevere Ghazi, DOB 24-Mar-1932, MRN [id number]  PCP:  Alonza Bogus, MD  Cardiologist: Bryna Colander, NP   No chief complaint on file.     History of Present Illness: [name] Macias is a 81 y.o. male who presents for ongoing assessment and management of NICM, EF of 15%-20%. CHF. S/p ICD implant (Medtronic), non-obstructive CAD with proximal diffuse stenosis of the RCA. He was recently admitted for decompensated CHF in the setting of dietary non-compliance He has also been losing wt and could not tell he was retaining fluid until his breathing became worse. Discharge wt was 155 lbs.   He comes today without complaints. He is medically compliant. Denies dizziness, worsening dyspnea compared to baseline, or chest pain. He sister in law is with him, and she makes sure that he is taken care of, and his medications are picked up from the pharmacy.   Past Medical History  Diagnosis Date  . Arthritis   . Gout   . Non-ischemic cardiomyopathy ([place])   . Hypertension   . CAD (coronary artery disease) of artery bypass graft   . COPD (chronic obstructive pulmonary disease) ([place])   . History of syphilis   . Type 2 HSV infection of penis   . Erectile dysfunction   . Anxiety disorder   . Dementia   . GERD (gastroesophageal reflux disease)   . Ascending aortic aneurysm ([place])   . Lung nodule     RUL nodule noted on abdominal CT  . Automatic implantable cardioverter-defibrillator in situ 07/20/2008  . Chronic systolic heart failure ([place])   . Prostate cancer ([place])     Diagnosed in 01/2013 by Dr Michela Pitcher adenocarcinoma felt to be localized, gleason score 6 with a pSA level of 8.4. No bone scan by Dr Michela Pitcher, referred to oncology, no radiation treatment offered    . CKD (chronic kidney disease) stage 3, GFR 30-59 ml/min     Past Surgical History  Procedure Laterality Date  . Ankle surgery Left   . Insertion of icd  02/19/05  . Crtd upgrade   12/14/2009    Medtronic  . Esophagogastroduodenoscopy N/A 07/01/2013    Procedure: ESOPHAGOGASTRODUODENOSCOPY (EGD);  Surgeon: Rogene Houston, MD;  [facility];  Service: Endoscopy;  Laterality: N/A;  145-moved to 1245 Ann notfied pt  . Esophagogastroduodenoscopy N/A 10/28/2014    Procedure: ESOPHAGOGASTRODUODENOSCOPY (EGD);  Surgeon: Rogene Houston, MD;  Location[facility];  Service: Endoscopy;  Laterality: N/A;  . Colonoscopy N/A 10/29/2014    Procedure: COLONOSCOPY;  Surgeon: Rogene Houston, MD;  [facility];  Service: Endoscopy;  Laterality: N/A;     Current Outpatient Prescriptions  Medication Sig Dispense Refill  . acetaminophen (TYLENOL) 500 MG tablet Take 1,000 mg by mouth daily as needed for moderate pain.     Marland Kitchen allopurinol (ZYLOPRIM) 100 MG tablet Take 1 tablet (100 mg total) by mouth daily. 90 tablet 1  . ALPRAZolam (XANAX) 0.25 MG tablet Take 0.25 mg by mouth 2 (two) times daily as needed for anxiety.   2  . Ascorbic Acid (VITAMIN C PO) Take 1 tablet by mouth daily.    . benzonatate (TESSALON PERLES) 100 MG capsule Take 2 capsules (200 mg total) by mouth 3 (three) times daily. 60 capsule 0  . carvedilol (COREG) 6.25 MG tablet Take 1 tablet (6.25 mg total) by mouth 2 (two) times daily with a meal. 60 tablet 12  .  Cholecalciferol (VITAMIN D PO) Take 2 tablets by mouth daily.    Marland Kitchen donepezil (ARICEPT) 5 MG tablet Take 1 tablet (5 mg total) by mouth at bedtime. 30 tablet 12  . doxycycline (VIBRA-TABS) 100 MG tablet Take 1 tablet (100 mg total) by mouth every 12 (twelve) hours. 14 tablet 0  . ferrous sulfate (FERROUSUL) 325 (65 FE) MG tablet Take 1 tablet (325 mg total) by mouth daily with breakfast. 30 tablet 3  . furosemide (LASIX) 40 MG tablet Take 1 tablet (40 mg total) by mouth 2 (two) times daily. 180 tablet 1  . HYDROcodone-acetaminophen (NORCO) 5-325 MG per tablet Take 2 tablets by mouth every 4 (four) hours as needed. (Patient taking differently:  Take 2 tablets by mouth every 4 (four) hours as needed for moderate pain. ) 6 tablet 0  . indomethacin (INDOCIN) 25 MG capsule Take 25 mg by mouth 2 (two) times daily as needed for mild pain or moderate pain.   4  . isosorbide dinitrate (ISORDIL) 5 MG tablet Take 5 mg by mouth 2 (two) times daily.    Marland Kitchen LORazepam (ATIVAN) 0.5 MG tablet Take 1 tablet (0.5 mg total) by mouth every 6 (six) hours as needed for anxiety. 50 tablet 5  . losartan (COZAAR) 25 MG tablet Take 1 tablet (25 mg total) by mouth daily. 90 tablet 1  . mirtazapine (REMERON) 15 MG tablet TAKE 1 TABLET BY MOUTH AT BEDTIME (Patient not taking: Reported on 12/12/2014) 30 tablet 2  . nitroGLYCERIN (NITROSTAT) 0.4 MG SL tablet Place 1 tablet (0.4 mg total) under the tongue every 5 (five) minutes as needed for chest pain. For chest pain 30 tablet 6  . omeprazole (PRILOSEC) 20 MG capsule Take 1 capsule (20 mg total) by mouth 2 (two) times daily before a meal. 30 capsule 3  . ondansetron (ZOFRAN ODT) 4 MG disintegrating tablet Take 1 tablet (4 mg total) by mouth 2 (two) times daily. 20 tablet 0  . OXYGEN Inhale 2 L into the lungs at bedtime. May use daily as needed    . sertraline (ZOLOFT) 50 MG tablet Take 50 mg by mouth daily.  5  . spironolactone (ALDACTONE) 25 MG tablet Take 1 tablet (25 mg total) by mouth daily. 90 tablet 1  . temazepam (RESTORIL) 30 MG capsule Take 30 mg by mouth at bedtime.     No current facility-administered medications for this visit.    Allergies:   Codeine; Enalapril maleate; and Sulfonamide derivatives    Social History:  The patient  reports that he quit smoking about 51 years ago. His smoking use included Cigarettes. He has a 10 pack-year smoking history. He has never used smokeless tobacco. He reports that he does not drink alcohol or use illicit drugs.   Family History:  The patient's family history includes Diabetes in his brother; Heart failure in his brother and father; Hypertension in his brother;  Prostate cancer in his brother.    ROS: All other systems are reviewed and negative. Unless otherwise mentioned in H&P    PHYSICAL EXAM: VS:  There were no vitals taken for this visit. , BMI There is no weight on file to calculate BMI. GEN: Well nourished, well developed, in no acute distress HEENT: normal Neck: no JVD, carotid bruits, or masses Cardiac: RRR; occasional extra systole,  no murmurs, rubs, or gallops,no edema  Respiratory:  clear to auscultation bilaterally, normal work of breathing GI: soft, nontender, nondistended, + BS MS: no deformity or atrophy Skin: warm  and dry, no rash Neuro:  Strength and sensation are intact Psych: euthymic mood, full affect Recent Labs: 10/28/2014: TSH 1.611 12/10/2014: ALT 22; B Natriuretic Peptide 2794.0*; Hemoglobin 11.0*; Platelets 207 12/13/2014: BUN 33*; Creatinine, Ser 1.69*; Potassium 3.9; Sodium 141    Lipid Panel    Component Value Date/Time   CHOL 169 01/25/2014 0225   TRIG 112 01/25/2014 0225   HDL 27* 01/25/2014 0225   CHOLHDL 6.3 01/25/2014 0225   VLDL 22 01/25/2014 0225   LDLCALC 120* 01/25/2014 0225      Wt Readings from Last 3 Encounters:  12/13/14 155 lb 4.8 oz (70.444 kg)  10/31/14 167 lb 1.7 oz (75.8 kg)  05/30/14 150 lb 4.8 oz (68.176 kg)     ASSESSMENT AND PLAN:  1. Systolic CHF: He remains at baseline wt. No evidence of decompensation. He will continue his current regimen. Will see him in 3 months unless symptomatic  2. NICM: EF is 25%. BP is well controlled for this EF. Continue the carvedilol, losartan, spironolactone. I will check a BMET in 3 months prior to next appointment.   3. ICD in Situ: He is due to see Dr. Lovena Le in March for [facility] interrogation and face to face.    Current medicines are reviewed at length with the patient today.    Labs/ tests ordered today include: BMET in 3 months.  No orders of the defined types were placed in this encounter.     Disposition:   FU with 3 months   *Jory Sims, NP  12/20/2014 7:12 AM    [facility]. [address], [place], [place] [address] Phone: [phone]; Fax: [phone]

## 2014-12-20 NOTE — Progress Notes (Signed)
Name: [name] Macias    DOB: 05-12-1932  Age: 81 y.o.  MR#: [id number]       PCP:  Alonza Bogus, MD      Insurance: Payor: [employer] MEDICARE / Plan: [place] THN/NTSP / Product Type: *No Product type* /   CC:    Chief Complaint  Patient presents with  . Congestive Heart Failure  . Coronary Artery Disease    VS Filed Vitals:   12/20/14 1312  BP: 98/68  Pulse: 70  Height: 5\' 7"  (1.702 m)  Weight: 154 lb 9.6 oz (70.126 kg)  SpO2: 96%    Weights Current Weight  12/20/14 154 lb 9.6 oz (70.126 kg)  12/13/14 155 lb 4.8 oz (70.444 kg)  10/31/14 167 lb 1.7 oz (75.8 kg)    Blood Pressure  BP Readings from Last 3 Encounters:  12/20/14 98/68  12/13/14 118/84  10/31/14 115/67     Admit date:  (Not on file) Last encounter with RMR:  Visit date not found   Allergy Codeine; Enalapril maleate; and Sulfonamide derivatives  Current Outpatient Prescriptions  Medication Sig Dispense Refill  . acetaminophen (TYLENOL) 500 MG tablet Take 1,000 mg by mouth daily as needed for moderate pain.     Marland Kitchen allopurinol (ZYLOPRIM) 100 MG tablet Take 1 tablet (100 mg total) by mouth daily. 90 tablet 1  . ALPRAZolam (XANAX) 0.25 MG tablet Take 0.25 mg by mouth 2 (two) times daily as needed for anxiety.   2  . Ascorbic Acid (VITAMIN C PO) Take 1 tablet by mouth daily.    . benzonatate (TESSALON PERLES) 100 MG capsule Take 2 capsules (200 mg total) by mouth 3 (three) times daily. 60 capsule 0  . carvedilol (COREG) 3.125 MG tablet Take 3.125 mg by mouth 2 (two) times daily with a meal.    . donepezil (ARICEPT) 5 MG tablet Take 1 tablet (5 mg total) by mouth at bedtime. 30 tablet 12  . doxycycline (VIBRA-TABS) 100 MG tablet Take 1 tablet (100 mg total) by mouth every 12 (twelve) hours. 14 tablet 0  . ferrous sulfate (FERROUSUL) 325 (65 FE) MG tablet Take 1 tablet (325 mg total) by mouth daily with breakfast. 30 tablet 3  . furosemide (LASIX) 40 MG tablet Take 1 tablet (40 mg total) by mouth 2 (two)  times daily. 180 tablet 1  . HYDROcodone-acetaminophen (NORCO) 5-325 MG per tablet Take 2 tablets by mouth every 4 (four) hours as needed. (Patient taking differently: Take 2 tablets by mouth every 4 (four) hours as needed for moderate pain. ) 6 tablet 0  . indomethacin (INDOCIN) 25 MG capsule Take 25 mg by mouth 2 (two) times daily as needed for mild pain or moderate pain.   4  . isosorbide dinitrate (ISORDIL) 5 MG tablet Take 5 mg by mouth 2 (two) times daily.    Marland Kitchen LORazepam (ATIVAN) 0.5 MG tablet Take 1 tablet (0.5 mg total) by mouth every 6 (six) hours as needed for anxiety. 50 tablet 5  . losartan (COZAAR) 25 MG tablet Take 1 tablet (25 mg total) by mouth daily. 90 tablet 1  . mirtazapine (REMERON) 15 MG tablet TAKE 1 TABLET BY MOUTH AT BEDTIME 30 tablet 2  . nitroGLYCERIN (NITROSTAT) 0.4 MG SL tablet Place 1 tablet (0.4 mg total) under the tongue every 5 (five) minutes as needed for chest pain. For chest pain 30 tablet 6  . omeprazole (PRILOSEC) 20 MG capsule Take 1 capsule (20 mg total) by mouth 2 (two)  times daily before a meal. 30 capsule 3  . ondansetron (ZOFRAN ODT) 4 MG disintegrating tablet Take 1 tablet (4 mg total) by mouth 2 (two) times daily. 20 tablet 0  . OXYGEN Inhale 2 L into the lungs at bedtime. May use daily as needed    . sertraline (ZOLOFT) 50 MG tablet Take 50 mg by mouth daily.  5  . spironolactone (ALDACTONE) 25 MG tablet Take 1 tablet (25 mg total) by mouth daily. 90 tablet 1  . temazepam (RESTORIL) 30 MG capsule Take 30 mg by mouth at bedtime.     No current facility-administered medications for this visit.    Discontinued Meds:    Medications Discontinued During This Encounter  Medication Reason  . Cholecalciferol (VITAMIN D PO) Error  . carvedilol (COREG) 6.25 MG tablet Error    Patient Active Problem List   Diagnosis Date Noted  . Acute on chronic systolic heart failure ([name]) 12/10/2014  . Elevated troponin 12/10/2014  . Dementia with behavioral  disturbance 10/31/2014  . Hematochezia 10/28/2014  . Essential hypertension 10/28/2014  . Acute blood loss anemia 10/28/2014  . Stage III chronic kidney disease 10/28/2014  . GI (gastrointestinal bleed) 10/28/2014  . Generalized abdominal cramping 10/28/2014  . Special screening for malignant neoplasms, colon 01/25/2014  . Ascending aortic aneurysm ([place])   . Lung nodule   . NSTEMI (non-ST elevated myocardial infarction) ([place]) 01/24/2014  . Abdominal pain 01/24/2014  . Dyspnea   . Chest pain 12/15/2013  . Depression with anxiety 11/30/2013  . Chronic bronchitis ([place]) 11/30/2013  . GERD (gastroesophageal reflux disease) 06/15/2013  . Dermatomycosis 03/21/2013  . Prostate cancer ([place]) 02/03/2013  . Inguinal hernia 01/17/2013  . Periumbilical hernia [id number]  . Renal cyst, left 01/17/2013  . Medical non-compliance 01/17/2013  . Elevated PSA 08/04/2010  . Prediabetes 08/04/2010  . HTN (hypertension) 08/04/2010  . Chronic systolic heart failure ([place]) 11/28/2009  . Nocturnal hypoxia 09/30/2009  . INSOMNIA 12/20/2008  . GYNECOMASTIA 07/20/2008  . Automatic implantable cardioverter-defibrillator in situ 07/20/2008  . GOUT 05/20/2008  . SHOULDER, ARTHRITIS, DEGEN./OSTEO 11/26/2007  . Nonischemic cardiomyopathy ([place]) 07/20/2007    LABS    Component Value Date/Time   NA 141 12/13/2014 0708   NA 141 12/12/2014 0632   NA 141 12/11/2014 0549   K 3.9 12/13/2014 0708   K 3.4* 12/12/2014 0632   K 3.3* 12/11/2014 0549   CL 108 12/13/2014 0708   CL 109 12/12/2014 0632   CL 110 12/11/2014 0549   CO2 26 12/13/2014 0708   CO2 25 12/12/2014 0632   CO2 26 12/11/2014 0549   GLUCOSE 97 12/13/2014 0708   GLUCOSE 108* 12/12/2014 0632   GLUCOSE 105* 12/11/2014 0549   BUN 33* 12/13/2014 0708   BUN 30* 12/12/2014 0632   BUN 31* 12/11/2014 0549   CREATININE 1.69* 12/13/2014 0708   CREATININE 1.49* 12/12/2014 0632   CREATININE 1.68* 12/11/2014 0549   CREATININE 1.64* 11/30/2013 0951    CREATININE 1.87* 03/18/2013 1441   CREATININE 1.69* 12/25/2011 1452   CALCIUM 9.4 12/13/2014 0708   CALCIUM 9.1 12/12/2014 0632   CALCIUM 8.8* 12/11/2014 0549   GFRNONAA 37* 12/13/2014 0708   GFRNONAA 43* 12/12/2014 0632   GFRNONAA 37* 12/11/2014 0549   GFRNONAA 39* 11/30/2013 0951   GFRAA 42* 12/13/2014 0708   GFRAA 49* 12/12/2014 0632   GFRAA 43* 12/11/2014 0549   GFRAA 45* 11/30/2013 0951   CMP     Component Value Date/Time   NA 141 12/13/2014  0708   K 3.9 12/13/2014 0708   CL 108 12/13/2014 0708   CO2 26 12/13/2014 0708   GLUCOSE 97 12/13/2014 0708   BUN 33* 12/13/2014 0708   CREATININE 1.69* 12/13/2014 0708   CREATININE 1.64* 11/30/2013 0951   CALCIUM 9.4 12/13/2014 0708   PROT 6.9 12/10/2014 1508   ALBUMIN 3.7 12/10/2014 1508   AST 21 12/10/2014 1508   ALT 22 12/10/2014 1508   ALKPHOS 88 12/10/2014 1508   BILITOT 0.7 12/10/2014 1508   GFRNONAA 37* 12/13/2014 0708   GFRNONAA 39* 11/30/2013 0951   GFRAA 42* 12/13/2014 0708   GFRAA 45* 11/30/2013 0951       Component Value Date/Time   WBC 6.1 12/10/2014 1508   WBC 6.2 11/03/2014 1457   WBC 7.8 10/31/2014 0607   HGB 11.0* 12/10/2014 1508   HGB 10.1* 11/03/2014 1457   HGB 9.9* 10/31/2014 0607   HCT 33.0* 12/10/2014 1508   HCT 31.1* 11/03/2014 1457   HCT 29.9* 10/31/2014 0607   MCV 90.9 12/10/2014 1508   MCV 92.6 11/03/2014 1457   MCV 92.3 10/31/2014 0607    Lipid Panel     Component Value Date/Time   CHOL 169 01/25/2014 0225   TRIG 112 01/25/2014 0225   HDL 27* 01/25/2014 0225   CHOLHDL 6.3 01/25/2014 0225   VLDL 22 01/25/2014 0225   LDLCALC 120* 01/25/2014 0225    ABG    Component Value Date/Time   PHART 7.443 10/03/2009 1515   PCO2ART 36.6 10/03/2009 1515   PO2ART 82.2 10/03/2009 1515   HCO3 24.7* 10/03/2009 1515   TCO2 21 01/24/2014 1243   ACIDBASEDEF 0.8 09/05/2009 2350   O2SAT 95.7 10/03/2009 1515     Lab Results  Component Value Date   TSH 1.611 10/28/2014   BNP (last 3  results)  Recent Labs  01/24/14 1235 12/10/14 1508  BNP 1108.0* 2794.0*    ProBNP (last 3 results) No results for input(s): PROBNP in the last 8760 hours.  Cardiac Panel (last 3 results) No results for input(s): CKTOTAL, CKMB, TROPONINI, RELINDX in the last 72 hours.  Iron/TIBC/Ferritin/ %Sat    Component Value Date/Time   IRON 81 03/18/2013 1441   FERRITIN 151 03/18/2013 1441     EKG Orders placed or performed during the hospital encounter of 12/10/14  . EKG 12-Lead  . EKG 12-Lead  . EKG     Prior Assessment and Plan Problem List as of 12/20/2014      Cardiovascular and Mediastinum   Nonischemic cardiomyopathy [facility])   Last Assessment & Plan 12/15/2013 Office Visit Written 12/15/2013 12:16 PM by Imogene Burn, PA-C    Patient has a history of a nonischemic cardiomyopathy dating back to 2004. EF 15-20%. Last echo in 2011. Patient has no symptoms of congestive heart failure. Continue current therapy of Coreg, Lasix, losartan, and Aldactone.      Chronic systolic heart failure [facility])   Last Assessment & Plan 03/18/2014 Office Visit Written 03/18/2014  9:34 AM by Evans Lance, MD    His symptoms remain class 2. He will continue his current meds. I have encouraged the patient to maintain a low sodium diet.      HTN (hypertension)   Last Assessment & Plan 03/18/2014 Office Visit Written 03/18/2014  9:34 AM by Evans Lance, MD    His blood pressure is very well controlled. No change in medications. He will maintain a low sodium diet.      NSTEMI (non-ST elevated myocardial infarction) (  [place])   Ascending aortic aneurysm ([place])   Essential hypertension   Acute on chronic systolic heart failure [facility])     Respiratory   Nocturnal hypoxia   Last Assessment & Plan 11/30/2013 Office Visit Written 01/25/2014  5:22 AM by Fayrene Helper, MD    Importance of use of supplemenytal oxygen during sleep is stressed      Chronic bronchitis [facility])   Last Assessment & Plan 11/30/2013 Office  Visit Written 01/25/2014  5:13 AM by Fayrene Helper, MD    Penicillin prescribed and pt to obtain CXR which had previously been ordered        Digestive   GERD (gastroesophageal reflux disease)   Last Assessment & Plan 11/30/2013 Office Visit Written 01/25/2014  5:20 AM by Fayrene Helper, MD    Controlled, no change in medication       GI (gastrointestinal bleed)     Nervous and Auditory   Dementia with behavioral disturbance     Musculoskeletal and Integument   SHOULDER, ARTHRITIS, DEGEN./OSTEO   Last Assessment & Plan 04/17/2011 Office Visit Written 04/21/2011  9:13 PM by Fayrene Helper, MD    Tramadol at bedtime as needed for pain  To be continued      Dermatomycosis   Last Assessment & Plan 03/18/2013 Office Visit Written 03/21/2013  6:24 PM by Fayrene Helper, MD    Fungal rash on right leg, topical antifungal twice daily for 10 days        Genitourinary   Renal cyst, left   Last Assessment & Plan 03/10/2013 Office Visit Written 03/12/2013  2:38 PM by Fayrene Helper, MD    Abdominal scan in 2015 notes no significant change in size      Prostate cancer [facility])   Last Assessment & Plan 03/10/2013 Office Visit Written 03/12/2013  2:37 PM by Fayrene Helper, MD    Evaluated at oncology clinic in [place] in 01/2013, patient and his wife are opting for conservative /no treatment at this time      Stage III chronic kidney disease     Other   GOUT   Last Assessment & Plan 12/25/2011 Office Visit Written 12/29/2011 10:23 AM by Fayrene Helper, MD    No recent flare check uric acid level      [place] 03/18/2013 Office Visit Written 03/21/2013  6:25 PM by Fayrene Helper, MD    Sleep hygiene reviewed and written information offered also. Prescription sent for  medication needed.       Automatic implantable cardioverter-defibrillator in situ   Last Assessment & Plan 03/18/2014 Office Visit Written 03/18/2014  9:35 AM by Evans Lance, MD    His medtronic DDD ICD is working normally. Will recheck in several months.      Elevated PSA   Last Assessment & Plan 12/16/2012 Office Visit Written 01/17/2013  1:18 AM by Fayrene Helper, MD    Pt followed by urology, needs to keep f/u appt      Prediabetes   Last Assessment & Plan 03/10/2013 Office Visit Written 03/12/2013  2:40 PM by Fayrene Helper, MD    Updated lab needed at/ before next visit. Pt advised to follow low carb diet to reduce risk of developing diabetes      Inguinal hernia   Last Assessment & Plan 12/16/2012 Office Visit Written 01/17/2013  1:35 AM by Fayrene Helper, MD  Reports being followed by pulmonary???Pt is a high surgical risk, however,Ii need to ensure  He understands the symptoms to be aware of , in the event  he develops incarceration. Also need to see if he has ever had surgical opinion , form history provided, this does not seem to be the case      Periumbilical hernia   Last Assessment & Plan 12/16/2012 Office Visit Written 01/17/2013  1:40 AM by Fayrene Helper, MD    Pt reports this is being followed by pulmonary?   Will need to review significance of this with him and offer surgical opinion      Medical non-compliance   Last Assessment & Plan 12/16/2012 Office Visit Written 01/17/2013  1:49 AM by Fayrene Helper, MD    Still has not had colonoscopy recommended 1 year ago, importance of colon screening stressed      Depression with anxiety   Last Assessment & Plan 11/30/2013 Office Visit Edited 01/25/2014  5:21 AM by Fayrene Helper, MD    Start remron for help with sleep, depression, anxiety and appetite , f/u in 2 months, pt not suicidal or homicidal Mental staus has deteriorated sincedx of prostate cancer per spouse, no treatment at this time , just observation per urology      Chest pain   Last Assessment & Plan 12/15/2013 Office Visit Written 12/15/2013 12:15 PM by Imogene Burn, PA-C    Patient had an episode of  chest pain that awakened him from sleep relieved with one sublingual nitroglycerin. He was not wearing his oxygen at the time. This is his only episode. He is very active and has no exertional symptoms. Last cardiac catheterization was in 2004 and  showed nonobstructive CAD. He has never had a stress test. I discussed further workup with possible stress Myoview but the patient would like to hold off at this time. He says he's feeling well and is able to do everything he wants to do without any problems. He says he will call if he has any further chest pain. Follow-up with Dr. Lovena Le with a defibrillator check in March.      Abdominal pain   Dyspnea   Special screening for malignant neoplasms, colon   Last Assessment & Plan 11/30/2013 Office Visit Written 01/25/2014  5:23 AM by Fayrene Helper, MD    Rectal exam no mass and heme negative stool      Lung nodule   Hematochezia   Acute blood loss anemia   Generalized abdominal cramping   Elevated troponin       Imaging: Dg Chest 2 View  12/10/2014  CLINICAL DATA:  Fatigue and shortness of breath EXAM: CHEST  2 VIEW COMPARISON:  01/24/2014 chest radiograph. FINDINGS: Stable configuration of 3 lead left subclavian ICD with lead tips in the right atrium and right ventricular apex. Stable cardiomediastinal silhouette with moderate cardiomegaly. No pneumothorax. Trace left pleural effusion. Mild pulmonary edema. IMPRESSION: 1. Mild congestive heart failure. 2. Trace left pleural effusion. Electronically Signed   By: Ilona Sorrel M.D.   On: 12/10/2014 15:57

## 2015-01-04 ENCOUNTER — Other Ambulatory Visit: Payer: Self-pay | Admitting: Family Medicine

## 2015-01-23 ENCOUNTER — Other Ambulatory Visit: Payer: Self-pay | Admitting: Family Medicine

## 2015-02-16 ENCOUNTER — Encounter (INDEPENDENT_AMBULATORY_CARE_PROVIDER_SITE_OTHER): Payer: Self-pay | Admitting: *Deleted

## 2015-02-28 ENCOUNTER — Telehealth: Payer: Self-pay | Admitting: *Deleted

## 2015-02-28 NOTE — Telephone Encounter (Signed)
Kaylene notified of the need for an appt. States she will have pt here 2/15 at 1:45.

## 2015-02-28 NOTE — Telephone Encounter (Signed)
[facility] called and states that pt does not feel well today. Pt c/o SOB with exertion. Lower extremity swelling. Wt yesterday was 152 lb and wt today is 150 lb. Will forward to Dr. Harl Bowie (DOD)

## 2015-02-28 NOTE — Telephone Encounter (Signed)
Add him on to the NP/PA schedule for tomorrow   J Darriel Utter MD

## 2015-03-01 ENCOUNTER — Ambulatory Visit: Payer: Commercial Managed Care - HMO | Admitting: Physician Assistant

## 2015-03-08 ENCOUNTER — Ambulatory Visit (INDEPENDENT_AMBULATORY_CARE_PROVIDER_SITE_OTHER): Payer: Commercial Managed Care - HMO | Admitting: Internal Medicine

## 2015-03-09 ENCOUNTER — Encounter (INDEPENDENT_AMBULATORY_CARE_PROVIDER_SITE_OTHER): Payer: Self-pay | Admitting: Internal Medicine

## 2015-03-17 ENCOUNTER — Encounter (HOSPITAL_COMMUNITY): Payer: Self-pay | Admitting: Emergency Medicine

## 2015-03-17 ENCOUNTER — Emergency Department (HOSPITAL_COMMUNITY): Payer: Commercial Managed Care - HMO

## 2015-03-17 ENCOUNTER — Inpatient Hospital Stay (HOSPITAL_COMMUNITY)
Admission: EM | Admit: 2015-03-17 | Discharge: 2015-03-20 | DRG: 291 | Disposition: A | Discharge status: Home Health | Payer: Commercial Managed Care - HMO | Attending: Pulmonary Disease | Admitting: Pulmonary Disease

## 2015-03-17 DIAGNOSIS — Z87891 Personal history of nicotine dependence: Secondary | ICD-10-CM

## 2015-03-17 DIAGNOSIS — I712 Thoracic aortic aneurysm, without rupture: Secondary | ICD-10-CM | POA: Diagnosis present

## 2015-03-17 DIAGNOSIS — I130 Hypertensive heart and chronic kidney disease with heart failure and stage 1 through stage 4 chronic kidney disease, or unspecified chronic kidney disease: Principal | ICD-10-CM | POA: Diagnosis present

## 2015-03-17 DIAGNOSIS — K922 Gastrointestinal hemorrhage, unspecified: Secondary | ICD-10-CM | POA: Diagnosis present

## 2015-03-17 DIAGNOSIS — I429 Cardiomyopathy, unspecified: Secondary | ICD-10-CM | POA: Diagnosis not present

## 2015-03-17 DIAGNOSIS — K5900 Constipation, unspecified: Secondary | ICD-10-CM | POA: Diagnosis present

## 2015-03-17 DIAGNOSIS — I5023 Acute on chronic systolic (congestive) heart failure: Secondary | ICD-10-CM

## 2015-03-17 DIAGNOSIS — Z95810 Presence of automatic (implantable) cardiac defibrillator: Secondary | ICD-10-CM

## 2015-03-17 DIAGNOSIS — F0391 Unspecified dementia with behavioral disturbance: Secondary | ICD-10-CM | POA: Diagnosis present

## 2015-03-17 DIAGNOSIS — I472 Ventricular tachycardia: Secondary | ICD-10-CM | POA: Diagnosis present

## 2015-03-17 DIAGNOSIS — Z9981 Dependence on supplemental oxygen: Secondary | ICD-10-CM

## 2015-03-17 DIAGNOSIS — F03918 Unspecified dementia, unspecified severity, with other behavioral disturbance: Secondary | ICD-10-CM | POA: Diagnosis present

## 2015-03-17 DIAGNOSIS — Z833 Family history of diabetes mellitus: Secondary | ICD-10-CM

## 2015-03-17 DIAGNOSIS — R0602 Shortness of breath: Secondary | ICD-10-CM | POA: Diagnosis present

## 2015-03-17 DIAGNOSIS — Z79899 Other long term (current) drug therapy: Secondary | ICD-10-CM | POA: Diagnosis not present

## 2015-03-17 DIAGNOSIS — C61 Malignant neoplasm of prostate: Secondary | ICD-10-CM | POA: Diagnosis present

## 2015-03-17 DIAGNOSIS — R911 Solitary pulmonary nodule: Secondary | ICD-10-CM | POA: Diagnosis present

## 2015-03-17 DIAGNOSIS — J9611 Chronic respiratory failure with hypoxia: Secondary | ICD-10-CM | POA: Diagnosis present

## 2015-03-17 DIAGNOSIS — I509 Heart failure, unspecified: Secondary | ICD-10-CM

## 2015-03-17 DIAGNOSIS — I2510 Atherosclerotic heart disease of native coronary artery without angina pectoris: Secondary | ICD-10-CM | POA: Diagnosis present

## 2015-03-17 DIAGNOSIS — Z8249 Family history of ischemic heart disease and other diseases of the circulatory system: Secondary | ICD-10-CM

## 2015-03-17 DIAGNOSIS — G4700 Insomnia, unspecified: Secondary | ICD-10-CM | POA: Diagnosis present

## 2015-03-17 DIAGNOSIS — R7303 Prediabetes: Secondary | ICD-10-CM | POA: Diagnosis present

## 2015-03-17 DIAGNOSIS — N189 Chronic kidney disease, unspecified: Secondary | ICD-10-CM

## 2015-03-17 DIAGNOSIS — Z8042 Family history of malignant neoplasm of prostate: Secondary | ICD-10-CM

## 2015-03-17 DIAGNOSIS — D649 Anemia, unspecified: Secondary | ICD-10-CM | POA: Diagnosis present

## 2015-03-17 DIAGNOSIS — K219 Gastro-esophageal reflux disease without esophagitis: Secondary | ICD-10-CM | POA: Diagnosis present

## 2015-03-17 DIAGNOSIS — G92 Toxic encephalopathy: Secondary | ICD-10-CM | POA: Diagnosis present

## 2015-03-17 DIAGNOSIS — N183 Chronic kidney disease, stage 3 unspecified: Secondary | ICD-10-CM | POA: Diagnosis present

## 2015-03-17 DIAGNOSIS — I5021 Acute systolic (congestive) heart failure: Secondary | ICD-10-CM | POA: Diagnosis not present

## 2015-03-17 DIAGNOSIS — N179 Acute kidney failure, unspecified: Secondary | ICD-10-CM | POA: Diagnosis not present

## 2015-03-17 DIAGNOSIS — I5043 Acute on chronic combined systolic (congestive) and diastolic (congestive) heart failure: Secondary | ICD-10-CM | POA: Diagnosis present

## 2015-03-17 DIAGNOSIS — M109 Gout, unspecified: Secondary | ICD-10-CM | POA: Diagnosis present

## 2015-03-17 DIAGNOSIS — J449 Chronic obstructive pulmonary disease, unspecified: Secondary | ICD-10-CM | POA: Diagnosis present

## 2015-03-17 DIAGNOSIS — N184 Chronic kidney disease, stage 4 (severe): Secondary | ICD-10-CM | POA: Diagnosis present

## 2015-03-17 DIAGNOSIS — I5020 Unspecified systolic (congestive) heart failure: Secondary | ICD-10-CM

## 2015-03-17 DIAGNOSIS — I7121 Aneurysm of the ascending aorta, without rupture: Secondary | ICD-10-CM | POA: Diagnosis present

## 2015-03-17 HISTORY — DX: Atherosclerotic heart disease of native coronary artery without angina pectoris: I25.10

## 2015-03-17 HISTORY — DX: Essential (primary) hypertension: I10

## 2015-03-17 LAB — TROPONIN I
TROPONIN I: 1.14 ng/mL — AB (ref <0.031)
Troponin I: 1.16 ng/mL (ref <0.031)

## 2015-03-17 LAB — CBC
HCT: 33.5 % — ABNORMAL LOW (ref 39.0–52.0)
Hemoglobin: 10.8 g/dL — ABNORMAL LOW (ref 13.0–17.0)
MCH: 28.1 pg (ref 26.0–34.0)
MCHC: 32.2 g/dL (ref 30.0–36.0)
MCV: 87 fL (ref 78.0–100.0)
PLATELETS: 191 10*3/uL (ref 150–400)
RBC: 3.85 MIL/uL — AB (ref 4.22–5.81)
RDW: 15.7 % — ABNORMAL HIGH (ref 11.5–15.5)
WBC: 4.7 10*3/uL (ref 4.0–10.5)

## 2015-03-17 LAB — GLUCOSE, CAPILLARY: Glucose-Capillary: 125 mg/dL — ABNORMAL HIGH (ref 65–99)

## 2015-03-17 LAB — COMPREHENSIVE METABOLIC PANEL
ALT: 11 U/L — AB (ref 17–63)
AST: 16 U/L (ref 15–41)
Albumin: 3.8 g/dL (ref 3.5–5.0)
Alkaline Phosphatase: 58 U/L (ref 38–126)
Anion gap: 10 (ref 5–15)
BUN: 48 mg/dL — AB (ref 6–20)
CHLORIDE: 107 mmol/L (ref 101–111)
CO2: 26 mmol/L (ref 22–32)
CREATININE: 2.44 mg/dL — AB (ref 0.61–1.24)
Calcium: 9.4 mg/dL (ref 8.9–10.3)
GFR calc Af Amer: 27 mL/min — ABNORMAL LOW (ref >60)
GFR, EST NON AFRICAN AMERICAN: 23 mL/min — AB (ref >60)
Glucose, Bld: 115 mg/dL — ABNORMAL HIGH (ref 65–99)
Potassium: 4.1 mmol/L (ref 3.5–5.1)
SODIUM: 143 mmol/L (ref 135–145)
Total Bilirubin: 1.1 mg/dL (ref 0.3–1.2)
Total Protein: 7.3 g/dL (ref 6.5–8.1)

## 2015-03-17 LAB — AMMONIA: AMMONIA: 12 umol/L (ref 9–35)

## 2015-03-17 LAB — BRAIN NATRIURETIC PEPTIDE: B Natriuretic Peptide: 2690 pg/mL — ABNORMAL HIGH (ref 0.0–100.0)

## 2015-03-17 MED ORDER — SODIUM CHLORIDE 0.9% FLUSH: 3.0000 mL | INTRAVENOUS | Status: DC | PRN

## 2015-03-17 MED ORDER — SENNOSIDES-DOCUSATE SODIUM 8.6-50 MG PO TABS
1.0000 | ORAL_TABLET | Freq: Every day | ORAL | Status: DC
  Administered 2015-03-17 – 2015-03-19 (×3): 1 via ORAL
  Filled 2015-03-17 (×6): qty 1

## 2015-03-17 MED ORDER — TEMAZEPAM 15 MG PO CAPS: 30.0000 mg | ORAL_CAPSULE | Freq: Every day | ORAL | Status: DC

## 2015-03-17 MED ORDER — MIRTAZAPINE 15 MG PO TABS
15.0000 mg | ORAL_TABLET | Freq: Every day | ORAL | Status: DC
  Administered 2015-03-17 – 2015-03-19 (×3): 15 mg via ORAL
  Filled 2015-03-17 (×3): qty 1

## 2015-03-17 MED ORDER — ACETAMINOPHEN 325 MG PO TABS: 650.0000 mg | ORAL_TABLET | ORAL | Status: DC | PRN

## 2015-03-17 MED ORDER — ZOLPIDEM TARTRATE 5 MG PO TABS: 5.0000 mg | ORAL_TABLET | Freq: Every evening | ORAL | Status: DC | PRN

## 2015-03-17 MED ORDER — FUROSEMIDE 40 MG PO TABS
40.0000 mg | ORAL_TABLET | Freq: Two times a day (BID) | ORAL | Status: DC
  Administered 2015-03-17 – 2015-03-20 (×6): 40 mg via ORAL
  Filled 2015-03-17 (×6): qty 1

## 2015-03-17 MED ORDER — POLYETHYLENE GLYCOL 3350 17 G PO PACK
17.0000 g | PACK | Freq: Every day | ORAL | Status: DC
  Administered 2015-03-18 – 2015-03-20 (×3): 17 g via ORAL
  Filled 2015-03-17 (×3): qty 1

## 2015-03-17 MED ORDER — ALPRAZOLAM 0.25 MG PO TABS
0.2500 mg | ORAL_TABLET | Freq: Two times a day (BID) | ORAL | Status: DC | PRN
Start: 2015-03-17 — End: 2015-03-20

## 2015-03-17 MED ORDER — ISOSORBIDE DINITRATE 10 MG PO TABS
5.0000 mg | ORAL_TABLET | Freq: Two times a day (BID) | ORAL | Status: DC
  Administered 2015-03-18 – 2015-03-20 (×5): 5 mg via ORAL
  Filled 2015-03-17: qty 0.5
  Filled 2015-03-17 (×3): qty 1
  Filled 2015-03-17 (×3): qty 0.5
  Filled 2015-03-17 (×3): qty 1

## 2015-03-17 MED ORDER — PANTOPRAZOLE SODIUM 40 MG PO TBEC
40.0000 mg | DELAYED_RELEASE_TABLET | Freq: Every day | ORAL | Status: DC
  Administered 2015-03-17 – 2015-03-20 (×4): 40 mg via ORAL
  Filled 2015-03-17 (×4): qty 1

## 2015-03-17 MED ORDER — ENSURE ENLIVE PO LIQD
237.0000 mL | Freq: Two times a day (BID) | ORAL | Status: DC
  Administered 2015-03-18 – 2015-03-20 (×5): 237 mL via ORAL

## 2015-03-17 MED ORDER — ENOXAPARIN SODIUM 30 MG/0.3ML ~~LOC~~ SOLN
30.0000 mg | SUBCUTANEOUS | Status: DC
  Administered 2015-03-17 – 2015-03-19 (×3): 30 mg via SUBCUTANEOUS
  Filled 2015-03-17 (×3): qty 0.3

## 2015-03-17 MED ORDER — VITAMIN C 500 MG PO TABS
500.0000 mg | ORAL_TABLET | Freq: Every day | ORAL | Status: DC
  Administered 2015-03-17 – 2015-03-20 (×4): 500 mg via ORAL
  Filled 2015-03-17 (×4): qty 1

## 2015-03-17 MED ORDER — ONDANSETRON HCL 4 MG/2ML IJ SOLN: 4.0000 mg | Freq: Four times a day (QID) | INTRAMUSCULAR | Status: DC | PRN

## 2015-03-17 MED ORDER — SODIUM CHLORIDE 0.9 % IV SOLN: 250.0000 mL | INTRAVENOUS | Status: DC | PRN

## 2015-03-17 MED ORDER — FUROSEMIDE 10 MG/ML IJ SOLN
80.0000 mg | Freq: Once | INTRAMUSCULAR | Status: AC
  Administered 2015-03-17: 80 mg via INTRAVENOUS
  Filled 2015-03-17: qty 8

## 2015-03-17 MED ORDER — DONEPEZIL HCL 5 MG PO TABS
5.0000 mg | ORAL_TABLET | Freq: Every day | ORAL | Status: DC
  Administered 2015-03-17 – 2015-03-19 (×3): 5 mg via ORAL
  Filled 2015-03-17 (×3): qty 1

## 2015-03-17 MED ORDER — ONDANSETRON 4 MG PO TBDP
4.0000 mg | ORAL_TABLET | Freq: Two times a day (BID) | ORAL | Status: DC
  Administered 2015-03-17 – 2015-03-20 (×6): 4 mg via ORAL
  Filled 2015-03-17 (×6): qty 1

## 2015-03-17 MED ORDER — SODIUM CHLORIDE 0.9% FLUSH
3.0000 mL | Freq: Two times a day (BID) | INTRAVENOUS | Status: DC
  Administered 2015-03-17 – 2015-03-19 (×5): 3 mL via INTRAVENOUS

## 2015-03-17 MED ORDER — SERTRALINE HCL 50 MG PO TABS
50.0000 mg | ORAL_TABLET | Freq: Every day | ORAL | Status: DC
  Administered 2015-03-17 – 2015-03-20 (×4): 50 mg via ORAL
  Filled 2015-03-17 (×4): qty 1

## 2015-03-17 MED ORDER — FERROUS SULFATE 325 (65 FE) MG PO TABS
325.0000 mg | ORAL_TABLET | Freq: Every day | ORAL | Status: DC
  Administered 2015-03-18 – 2015-03-20 (×3): 325 mg via ORAL
  Filled 2015-03-17 (×3): qty 1

## 2015-03-17 MED ORDER — ALLOPURINOL 100 MG PO TABS
100.0000 mg | ORAL_TABLET | Freq: Every day | ORAL | Status: DC
  Administered 2015-03-17 – 2015-03-20 (×4): 100 mg via ORAL
  Filled 2015-03-17 (×4): qty 1

## 2015-03-17 NOTE — ED Notes (Addendum)
Pt from home. Pt c/o bilateral lower extremity edema, abdominal distention, and dyspnea x 2 weeks. Pt has hx of CHF. Family reports pt took an entire bottle of mag citrate last night. Pt denies any pain at this time. Pt has an implanted pacemaker/defib.

## 2015-03-17 NOTE — ED Notes (Signed)
Pt sleeping with family at bedside.

## 2015-03-17 NOTE — ED Notes (Signed)
CRITICAL VALUE ALERT  Critical value received: Troponin 1.16  Date of notification:  03/17/15  Time of notification:  [id number]  Critical value read back:Yes.    Nurse who received alert:  Laurell Josephs RN  MD notified (1st page):  [place]  Time of first page:  [phone]  MD notified (2nd page):  Time of second page:  Responding MD:  Roderic Palau  Time MD responded:  [phone]

## 2015-03-17 NOTE — H&P (Addendum)
[facility] History and Physical  Mickel Schreur IAX:[id number] DOB: Sep 11, 1932 DOA: 03/17/2015  Referring physician: Dr. Roderic Palau PCP: Alonza Bogus, MD  Specialists: Cardiology  Chief Complaint:  SOB, confusion  82 y/o ? NICM EF 15-20% ECHO 2011  Vtach s/p ICD Cath 2004 non osbt CAD Prior GI bleed 11/2014 [egd 06/2013 hiatal hernia] Bleeding in 11/2014 was Colonic diverticular bleed-trasnfused that admission Htn Stg 3-4 CKD PreDM HSV ty II Prostate cancer stage I unclear how treated H/o syphilis  Admit from ED with intermittent confusion which started overnight and has been happening for the past 4-5 days. Wife is not at bedside but per family report patient as been talking out of his head and has not been able to sleep at night because of abdominal discomfort. He states that he has also been short of breath now on and off for about 3 months with no aggravating or specific relieving factors. At baseline his functional status is poor just getting out of bed he feels short of breath but he winded however once he gets going he is able to walk maybe about 20-30 feet. He states he has some shortness of breath laying flat he does use oxygen at home family is not able to tell for how long he's been on this. He has no specific chest pain although had one episode last night no arm pain no neck pain He has no dysuria he has a mild cough with some mild productive whitish sputum 2 in the ED but no fevers no chills no rales no rhonchi He is compliant on his medications and does not eat much salt as per family When his family members daughter-in-law saw him last his legs were not swollen and uncomfortable to the emergency room they were pretty distended. He has no nausea no vomiting no dark stool no tarry stool    bun /creat 33/1.69--->48/2.44 Pro-BNP 2690, Troponin 1.16 Hb 10.8 WBC 4.7 PLT 191 Ct head stable 2 vw CXR wnl EKG RBBB   Past Medical History  Diagnosis Date  .  Arthritis   . Gout   . Non-ischemic cardiomyopathy (HCC)     LVEF 10-15%  . Essential hypertension   . CAD (coronary artery disease)     Nonobstructive 2004  . COPD (chronic obstructive pulmonary disease) ([place])   . History of syphilis   . Type 2 HSV infection of penis   . Erectile dysfunction   . Anxiety disorder   . Dementia   . GERD (gastroesophageal reflux disease)   . Ascending aortic aneurysm ([place])   . Lung nodule     RUL nodule noted on abdominal CT  . Automatic implantable cardioverter-defibrillator in situ 07/20/2008  . Chronic systolic heart failure ([place])   . Prostate cancer ([place])     Diagnosed in 01/2013 by Dr Michela Pitcher adenocarcinoma felt to be localized, gleason score 6 with a pSA level of 8.4. No bone scan by Dr Michela Pitcher, referred to oncology, no radiation treatment offered    . CKD (chronic kidney disease) stage 3, GFR 30-59 ml/min    Past Surgical History  Procedure Laterality Date  . Ankle surgery Left   . Insertion of icd  02/19/05  . Crtd upgrade  12/14/2009    [employer]  . Esophagogastroduodenoscopy N/A 07/01/2013    Procedure: ESOPHAGOGASTRODUODENOSCOPY (EGD);  Surgeon: Rogene Houston, MD;  [facility];  Service: Endoscopy;  Laterality: N/A;  145-moved to 1245 Ann notfied pt  . Esophagogastroduodenoscopy N/A 10/28/2014    Procedure:  ESOPHAGOGASTRODUODENOSCOPY (EGD);  Surgeon: Rogene Houston, MD;  Location: [facility];  Service: Endoscopy;  Laterality: N/A;  . Colonoscopy N/A 10/29/2014    Procedure: COLONOSCOPY;  Surgeon: Rogene Houston, MD;  Location: [facility];  Service: Endoscopy;  Laterality: N/A;   Social History:  Social History   Social History Narrative   Lives with his second wife currently at home   Former heavy smoker unclear when he quit   Chronically uses oxygen   Father had some diagnosis of cancer    Allergies  Allergen Reactions  . Codeine Hives  . Enalapril Maleate Hives    hives  . Sulfonamide Derivatives Hives     Family History  Problem Relation Age of Onset  . Heart failure Father   . Prostate cancer Brother   . Diabetes Brother   . Hypertension Brother   . Heart failure Brother     Prior to Admission medications   Medication Sig Start Date End Date Taking? Authorizing Provider  ALPRAZolam (XANAX) 0.25 MG tablet Take 0.25 mg by mouth 2 (two) times daily as needed for anxiety.  10/24/14  Yes Historical Provider, MD  carvedilol (COREG) 25 MG tablet Take 25 mg by mouth 2 (two) times daily with a meal.   Yes Historical Provider, MD  ferrous sulfate (FERROUSUL) 325 (65 FE) MG tablet Take 1 tablet (325 mg total) by mouth daily with breakfast. 10/31/14  Yes Sinda Du, MD  furosemide (LASIX) 40 MG tablet Take 1 tablet (40 mg total) by mouth 2 (two) times daily. 11/03/13  Yes Fayrene Helper, MD  indomethacin (INDOCIN) 25 MG capsule Take 25 mg by mouth 2 (two) times daily as needed for mild pain or moderate pain.  10/29/14  Yes Historical Provider, MD  isosorbide dinitrate (ISORDIL) 5 MG tablet Take 5 mg by mouth 2 (two) times daily.   Yes Historical Provider, MD  losartan (COZAAR) 25 MG tablet Take 1 tablet (25 mg total) by mouth daily. 11/03/13  Yes Fayrene Helper, MD  magnesium citrate SOLN Take 1 Bottle by mouth once.   Yes Historical Provider, MD  nitroGLYCERIN (NITROSTAT) 0.4 MG SL tablet Place 1 tablet (0.4 mg total) under the tongue every 5 (five) minutes as needed for chest pain. For chest pain 03/18/14  Yes Evans Lance, MD  oxyCODONE-acetaminophen (PERCOCET/ROXICET) 5-325 MG tablet Take 1 tablet by mouth 4 (four) times daily.  03/16/15  Yes Historical Provider, MD  OXYGEN Inhale 2 L into the lungs at bedtime. May use daily as needed   Yes Historical Provider, MD  pantoprazole (PROTONIX) 40 MG tablet Take 40 mg by mouth daily.  02/10/15  Yes Historical Provider, MD  sertraline (ZOLOFT) 50 MG tablet Take 50 mg by mouth daily. 04/11/14  Yes Historical Provider, MD  spironolactone  (ALDACTONE) 25 MG tablet Take 1 tablet (25 mg total) by mouth daily. 11/03/13  Yes Fayrene Helper, MD  acetaminophen (TYLENOL) 500 MG tablet Take 1,000 mg by mouth daily as needed for moderate pain.     Historical Provider, MD  allopurinol (ZYLOPRIM) 100 MG tablet Take 1 tablet (100 mg total) by mouth daily. 11/03/13   Fayrene Helper, MD  Ascorbic Acid (VITAMIN C PO) Take 1 tablet by mouth daily.    Historical Provider, MD  donepezil (ARICEPT) 5 MG tablet Take 1 tablet (5 mg total) by mouth at bedtime. 10/31/14   Sinda Du, MD  mirtazapine (REMERON) 15 MG tablet TAKE 1 TABLET BY MOUTH AT  BEDTIME 06/17/14   Fayrene Helper, MD  ondansetron (ZOFRAN ODT) 4 MG disintegrating tablet Take 1 tablet (4 mg total) by mouth 2 (two) times daily. 10/31/14   Sinda Du, MD  temazepam (RESTORIL) 30 MG capsule Take 30 mg by mouth at bedtime.    Historical Provider, MD   Physical Exam: Filed Vitals:   03/17/15 0830 03/17/15 0900 03/17/15 0930 03/17/15 1000  BP: 104/61 90/70 104/64 108/66  Pulse: 66 64 68 65  Temp:      TempSrc:      Resp: '16 17 17   ' Height:      Weight:      SpO2: 96% 92% 88% 90%    Alert pleasant oriented no apparent distress currently Current [place] and tell me where he is in terms of the hospital but cannot tell me the date season-thinks that mom is still the president No pallor no icterus, by temporalis wasting Edentulous upper teeth has some teeth at the bottom Mild JVD but 2-3 cm above clavicle no bruit S1-S2 no murmur rub or gallop Abdomen soft nontender nondistended no rebound Chest clinically clear no added sound no rales or rhonchi Grade 3-4 lower extremity edema eating pulses felt  Labs on Admission:  Basic Metabolic Panel:  Recent Labs Lab 03/17/15 0711  NA 143  K 4.1  CL 107  CO2 26  GLUCOSE 115*  BUN 48*  CREATININE 2.44*  CALCIUM 9.4   Liver Function Tests:  Recent Labs Lab 03/17/15 0711  AST 16  ALT 11*  ALKPHOS 58  BILITOT 1.1   PROT 7.3  ALBUMIN 3.8   No results for input(s): LIPASE, AMYLASE in the last 168 hours. No results for input(s): AMMONIA in the last 168 hours. CBC:  Recent Labs Lab 03/17/15 0711  WBC 4.7  HGB 10.8*  HCT 33.5*  MCV 87.0  PLT 191   Cardiac Enzymes:  Recent Labs Lab 03/17/15 0711  TROPONINI 1.16*    BNP (last 3 results)  Recent Labs  12/10/14 1508 03/17/15 0711  BNP 2794.0* 2690.0*    ProBNP (last 3 results) No results for input(s): PROBNP in the last 8760 hours.  CBG: No results for input(s): GLUCAP in the last 168 hours.  Radiological Exams on Admission: Dg Chest 2 View  03/17/2015  CLINICAL DATA:  Shortness of breath and bilateral lower extremity swelling for 2 weeks EXAM: CHEST  2 VIEW COMPARISON:  12/10/2014 FINDINGS: Cardiac shadow remains enlarged. A defibrillator is again seen and stable. No focal infiltrate or sizable effusion is noted. No bony abnormality is seen. IMPRESSION: No acute abnormality noted. Electronically Signed   By: Inez Catalina M.D.   On: 03/17/2015 08:25   Ct Head Wo Contrast  03/17/2015  CLINICAL DATA:  Altered mental status/confusion. EXAM: CT HEAD WITHOUT CONTRAST TECHNIQUE: Contiguous axial images were obtained from the base of the skull through the vertex without intravenous contrast. COMPARISON:  Head CT -05/29/2009; 12/21/2007 FINDINGS: Similar findings of mild likely age-appropriate atrophy with sulcal prominence. Scattered periventricular hypodensities compatible with microvascular ischemic disease are grossly unchanged. No CT evidence of superimposed acute large territory infarct. No intraparenchymal extra-axial mass or hemorrhage. Unchanged size and configuration of the ventricles and basilar cisterns. No midline shift. Limited visualization of the paranasal sinuses and mastoid air cells is normal. No air-fluid levels. Regional soft tissues appear normal. No displaced calvarial fracture. IMPRESSION: Similar findings of atrophy and  microvascular ischemic disease without acute intracranial process. Electronically Signed   By: Sandi Mariscal  M.D.   On: 03/17/2015 08:22    EKG: Independently reviewed. RBBB  Assessment/Plan Principal Problem:   Acute heart failure (HCC) Active Problems:   INSOMNIA   Prostate cancer (HCC)   Ascending aortic aneurysm (HCC)   Lung nodule   Stage III chronic kidney disease   GI (gastrointestinal bleed)   Dementia with behavioral disturbance   CHF (congestive heart failure) (HCC)  Decompensated acute systolic heart failure-likely secondary to poor compliance. Given Lasix IV 81 , Diuresis with double home dose of 40 mg by mouth daily of Lasix. Hold Coreg as well as ACE as well as Aldactone for now given moderate hypotension Echo performed 12/11/14 PA peak 64 , mild/moderate regurgitation , EF 10-15% with diffuse hypokinesis and moderate aortic regurg  Monitor be met daily, strict I and O, daily weights,   Toxic metabolic encephalopathy Patient seems to be close to what appears to be a baseline We will obtain an ammonia as well as LFTs to rule out any metabolic cause  V. tach with ICD placement-keep on telemetry. May need to reinstitute once decompensated CHF resolves lower dose of beta blocker.  Check a magnesium  Elevated troponin-Not having active chest pain, EKG does not show any new changes.  CAD-continue isosorbide dinitrate 5 mg twice a day  Prior GI bleed diverticular in 11/2014-keep on pantoprazole 40 daily. Still having abdominal issues with alternating constipation therefore keep on bowel regimen with MiraLAX and senna if no stool by tomorrow  Hypertension see above medication changes  Chronic kidney disease stage 3-4 cautious diuresis.  May need to consider goals of care versus discussion with nephrology if kidney function worsens  History of gout continue allopurinol 100 daily  Dementia-continue Aricept 5 daily at bedtime as well as alprazolam 0.25 twice a day when  necessary agitation, Zoloft 50 mg daily, Restoril 30 daily at bedtime, Remeron 15 daily at bedtime  Anemia of renal deficiency-continue ferrous sulfate 325 daily  Chronic hypoxic respiratory failure secondary to heart failure and potential COPD as former smoker continue home O2   Expect will need diuresis over the next 3-5 days Admit to telemetry Dr. Luan Pulling to assume care tomorrow  Discussed with family present at bedside who understand  Verlon Au [facility] Pager [phone]  If 7PM-7AM, please contact night-coverage [email or web address] Password [facility] 03/17/2015, 10:37 AM

## 2015-03-17 NOTE — Consult Note (Signed)
CARDIOLOGY CONSULT NOTE   Patient ID: [name] Macias MRN: [id number] DOB/AGE: 80/02/1932 80 y.o.  Admit Date: 03/17/2015 Referring Physician: ER Physician - Milton Ferguson, MD Primary Physician: Alonza Bogus, MD Consulting Cardiologist: Rozann Lesches MD Primary Cardiologist:Taylor, Carleene Overlie MD Reason for Consultation: LEE and positive troponin   Clinical Summary [name] Macias is an 82 y.o.male with known history of NICM, LVEF 15%-20%, CHF, ICD in situ (Medtronic 07/2008), non-obstructive CAD with proximal diffuse 40% stenosis of RCA, (per cath in 2004), CKD GFR 27, who has been noticing increasing abdominal girth, abdominal pain and LEE over the last few weeks. Family at bedside states that he has had chronic LEE, but this is worse than normal. They also states he has become more confused, staying up all night, and less oriented to time. They state this am, he was very confused with significant edema, leading his wife to call EMS and bring him to ER.   On arrival to ER, BP 115/71, HR 72, O2 Sat 100%. Hgb 10.8, Hct 33.5, Creatinine 2.44 (up from 1.69 in 11/2014). Troponin 1.16, BNP 2690. CXR negative for CHD or pneumonia, CT head negative for acute intracranial process. He was treated with lasix 80 mg IV (0945) and has now begun to diurese.   Family states that he has a HHN who sees him twice a week. He is medically compliant, but occasionally eats fast food. For the most part he does the cooking with groceries his daughter buys for him, that do not include salt.   Allergies  Allergen Reactions  . Codeine Hives  . Enalapril Maleate Hives    hives  . Sulfonamide Derivatives Hives    Home Medications No current facility-administered medications on file prior to encounter.   Current Outpatient Prescriptions on File Prior to Encounter  Medication Sig Dispense Refill  . ALPRAZolam (XANAX) 0.25 MG tablet Take 0.25 mg by mouth 2 (two) times daily as needed for anxiety.   2  . Ascorbic Acid  (VITAMIN C PO) Take 1 tablet by mouth daily.    . ferrous sulfate (FERROUSUL) 325 (65 FE) MG tablet Take 1 tablet (325 mg total) by mouth daily with breakfast. 30 tablet 3  . furosemide (LASIX) 40 MG tablet Take 1 tablet (40 mg total) by mouth 2 (two) times daily. 180 tablet 1  . indomethacin (INDOCIN) 25 MG capsule Take 25 mg by mouth 2 (two) times daily as needed for mild pain or moderate pain.   4  . isosorbide dinitrate (ISORDIL) 5 MG tablet Take 5 mg by mouth 2 (two) times daily.    Marland Kitchen losartan (COZAAR) 25 MG tablet Take 1 tablet (25 mg total) by mouth daily. 90 tablet 1  . nitroGLYCERIN (NITROSTAT) 0.4 MG SL tablet Place 1 tablet (0.4 mg total) under the tongue every 5 (five) minutes as needed for chest pain. For chest pain 30 tablet 6  . OXYGEN Inhale 2 L into the lungs at bedtime. May use daily as needed    . sertraline (ZOLOFT) 50 MG tablet Take 50 mg by mouth daily.  5  . spironolactone (ALDACTONE) 25 MG tablet Take 1 tablet (25 mg total) by mouth daily. 90 tablet 1  . acetaminophen (TYLENOL) 500 MG tablet Take 1,000 mg by mouth daily as needed for moderate pain.     Marland Kitchen allopurinol (ZYLOPRIM) 100 MG tablet Take 1 tablet (100 mg total) by mouth daily. 90 tablet 1  . donepezil (ARICEPT) 5 MG tablet Take 1 tablet (5 mg total)  by mouth at bedtime. 30 tablet 12  . mirtazapine (REMERON) 15 MG tablet TAKE 1 TABLET BY MOUTH AT BEDTIME 30 tablet 2  . temazepam (RESTORIL) 30 MG capsule Take 30 mg by mouth at bedtime.      Past Medical History  Diagnosis Date  . Arthritis   . Gout   . Non-ischemic cardiomyopathy (HCC)     LVEF 10-15%  . Essential hypertension   . CAD (coronary artery disease)     Nonobstructive 2004  . COPD (chronic obstructive pulmonary disease) ([place])   . History of syphilis   . Type 2 HSV infection of penis   . Erectile dysfunction   . Anxiety disorder   . Dementia   . GERD (gastroesophageal reflux disease)   . Ascending aortic aneurysm ([place])   . Lung nodule     RUL  nodule noted on abdominal CT  . Automatic implantable cardioverter-defibrillator in situ 07/20/2008  . Chronic systolic heart failure ([place])   . Prostate cancer ([place])     Diagnosed in 01/2013 by Dr Michela Pitcher adenocarcinoma felt to be localized, gleason score 6 with a pSA level of 8.4. No bone scan by Dr Michela Pitcher, referred to oncology, no radiation treatment offered    . CKD (chronic kidney disease) stage 3, GFR 30-59 ml/min     Past Surgical History  Procedure Laterality Date  . Ankle surgery Left   . Insertion of icd  02/19/05  . Crtd upgrade  12/14/2009    Medtronic  . Esophagogastroduodenoscopy N/A 07/01/2013    Procedure: ESOPHAGOGASTRODUODENOSCOPY (EGD);  Surgeon: Rogene Houston, MD;  Location: [facility];  Service: Endoscopy;  Laterality: N/A;  145-moved to 1245 Ann notfied pt  . Esophagogastroduodenoscopy N/A 10/28/2014    Procedure: ESOPHAGOGASTRODUODENOSCOPY (EGD);  Surgeon: Rogene Houston, MD;  Location: [facility];  Service: Endoscopy;  Laterality: N/A;  . Colonoscopy N/A 10/29/2014    Procedure: COLONOSCOPY;  Surgeon: Rogene Houston, MD;  Location: [facility];  Service: Endoscopy;  Laterality: N/A;    Family History  Problem Relation Age of Onset  . Heart failure Father   . Prostate cancer Brother   . Diabetes Brother   . Hypertension Brother   . Heart failure Brother     Social History [name] Macias reports that he quit smoking about 52 years ago. His smoking use included Cigarettes. He has a 10 pack-year smoking history. He has never used smokeless tobacco. [name] Macias reports that he does not drink alcohol.  Review of Systems Complete review of systems are found to be negative unless outlined in H&P above as as follows. Does report somewhat limited appetite. No syncope.  Physical Examination Blood pressure 113/68, pulse 69, temperature 97.6 F (36.4 C), temperature source Axillary, resp. rate 18, height 5\' 7"  (1.702 m), weight 154 lb (69.854 kg), SpO2 95 %. No intake  or output data in the 24 hours ending 03/17/15 1103  Telemetry: SR with RBBB.   GEN: Frail-appearing elderly male, no acute distress.  HEENT: Conjunctiva and lids normal, oropharynx clear. Neck: Supple, elevated JVP, no carotid bruits, no thyromegaly. Lungs: Clear to auscultation, nonlabored breathing at rest. Cardiac: Distinct PMI, RRR, soft systolic murmur, no S3, no pericardial rub. Abdomen: Mildly distended, nontender, bowel sounds present, no guarding or rebound. Extremities: 2+ pitting edema lower legs, with 1+ pretibial edema, distal pulses 2+. Skin: Warm and dry. Musculoskeletal: No kyphosis. Neuropsychiatric: Alert and oriented x3 , affect grossly appropriate. Poor memory.   Prior Cardiac Testing/Procedures  Echocardiogram 12/11/2014 Left ventricle: The cavity size was severely dilated. Wall thickness was normal. The estimated ejection fraction was in the range of 10% to 15%. Diffuse hypokinesis. Doppler parameters are consistent with restrictive physiology, indicative of decreased left ventricular diastolic compliance and/or increased left atrial pressure. Internal dimension, ED (PLAX chordal): 82 mm. - Aortic valve: There was mild to moderate regurgitation. The AI VC is 0.4 cm. Valve area (VTI): 2.94 cm^2. Valve area (Vmax): 3.3 cm^2. Regurgitation pressure half-time: 448 ms. - Aorta: Ascending aortic diameter: 41 mm (S). The proximal ascending aorta is mildly dilated. Please note that only the proximal portion of the ascending aorta is visualized. - Mitral valve: There was moderate regurgitation. The MR VC is 0.4 cm. - Left atrium: The atrium was severely dilated. - Right ventricle: The cavity size was mildly to moderately dilated. - Right atrium: The atrium was severely dilated. - Atrial septum: No defect or patent foramen ovale was identified. - Tricuspid valve: There was moderate regurgitation. - Pulmonary arteries: Systolic pressure was  moderately increased. PA peak pressure: 64 mm Hg (S). - Inferior vena cava: The vessel was dilated. The respirophasic diameter changes were blunted (< 50%), consistent with elevated central venous pressure. - Technically adequate study.  ICD Device Check 06/20/2014 ICD check in clinic. Normal device function. Thresholds and sensing consistent with previous device measurements. Impedance trends stable over time. 2 nst episodes--longest 16 beats. No mode switches. Histogram distribution appropriate for patient and level of activity. No changes made this session. Device programmed at appropriate safety margins. Device programmed to optimize intrinsic conduction. Optivol increase middle of March thru middle of May but stable at this time. Battery voltage 3.03 V. Pt enrolled in remote follow-up and Wire[employer] ordered for pt. ROV 10-06-14 unless monitor is set up and then can be cancelled. ROV in March 2017 with GT/RDS.  Cardiac Panel (last 3 results)  Recent Labs  03/17/15 0711  TROPONINI 1.16*   Lab Results  Basic Metabolic Panel:  Recent Labs Lab 03/17/15 0711  NA 143  K 4.1  CL 107  CO2 26  GLUCOSE 115*  BUN 48*  CREATININE 2.44*  CALCIUM 9.4    Liver Function Tests:  Recent Labs Lab 03/17/15 0711  AST 16  ALT 11*  ALKPHOS 58  BILITOT 1.1  PROT 7.3  ALBUMIN 3.8    CBC:  Recent Labs Lab 03/17/15 0711  WBC 4.7  HGB 10.8*  HCT 33.5*  MCV 87.0  PLT 191    Cardiac Enzymes:  Recent Labs Lab 03/17/15 0711  TROPONINI 1.16*    Radiology: Dg Chest 2 View  03/17/2015  CLINICAL DATA:  Shortness of breath and bilateral lower extremity swelling for 2 weeks EXAM: CHEST  2 VIEW COMPARISON:  12/10/2014 FINDINGS: Cardiac shadow remains enlarged. A defibrillator is again seen and stable. No focal infiltrate or sizable effusion is noted. No bony abnormality is seen. IMPRESSION: No acute abnormality noted. Electronically Signed   By: Inez Catalina M.D.   On: 03/17/2015  08:25   Ct Head Wo Contrast  03/17/2015  CLINICAL DATA:  Altered mental status/confusion. EXAM: CT HEAD WITHOUT CONTRAST TECHNIQUE: Contiguous axial images were obtained from the base of the skull through the vertex without intravenous contrast. COMPARISON:  Head CT -05/29/2009; 12/21/2007 FINDINGS: Similar findings of mild likely age-appropriate atrophy with sulcal prominence. Scattered periventricular hypodensities compatible with microvascular ischemic disease are grossly unchanged. No CT evidence of superimposed acute large territory infarct. No intraparenchymal extra-axial mass or hemorrhage. Unchanged size  and configuration of the ventricles and basilar cisterns. No midline shift. Limited visualization of the paranasal sinuses and mastoid air cells is normal. No air-fluid levels. Regional soft tissues appear normal. No displaced calvarial fracture. IMPRESSION: Similar findings of atrophy and microvascular ischemic disease without acute intracranial process. Electronically Signed   By: Sandi Mariscal M.D.   On: 03/17/2015 08:22    ECG: Paced with RBBB, rate of 71 bpm.   Impression and Recommendations  1. Elevated troponin I: Likely demand ischemia in the setting of CKD with decompensated CHF. He has denied chest pain, worsening dyspnea. Would follow trend.  2. Acute on Chronic Mixed CHF: End-stage. Severe systolic dysfunction with elevated Pro-BNP and LEE with abdominal distention, but no evidence of CHF per CXR. Question right heart failure predominant with otherwise low output. Would stop spironolactone in the setting of CKD. Continue IV lasix 40 mg BID, and consider change to torsemide on discharge for better bioavailability. Temporarily reduce carvedilol dose. On [facility] at home medication list, would not restart now. Change to hydralazine 10 mg TID.   3. CAD: History of nonobstructive disease as of 2004. Would trend cardiac markers.  4. ICD in situ: Followed by Dr. Lovena Le with next interrogation  scheduled for March 2017.   5. Dementia: Per family appears to be worsening. More confusion over the last few weeks. On Aricept as OP.   6. Anemia: Iron deficiency on replacement. CKD contributing.   Signed: Phill Myron. Lawrence NP [place]  03/17/2015, 11:03 AM Co-Sign MD  Attending note:  Patient seen and examined. Reviewed extensive records and updated his chart. I also modified above noted by Ms. Lawrence NP. Mr. Wiland presents with progressive leg edema as well as increased abdominal girth, limited appetite, intermittent chest discomfort. Family indicates progressive decline in his dementia as well. Reportedly compliant with medications, has home health nurse intermittently, but he does not specifically know the details of what he takes.  On examination he appears comfortable, mildly short of breath with talking. Heart rate in the [id number], systolic blood pressure [id number] to 115. Elevated JVP is evident, lungs are clear, cardiac exam reveals indistinct PMI with RRR and soft systolic murmur, no obvious S3. He has 2-3+ leg edema. Some abdominal protuberance. Lab work shows potassium 4.1, BUN 2.4, normal AST and ALT, BNP 2690, troponin I 1.16, hemoglobin 10.8. Chest x-ray shows no infiltrates or edema. ECG shows sinus rhythm with prolonged PR interval, right bundle branch block, and left anterior fascicular block.  Patient presents with acute on chronic combined heart failure, possibly right heart predominant and with generally low output. He is being admitted for IV diuresis. Agree with stopping Aldactone and holding Cozaar for now. Would also temporarily reduce dose of Coreg to 12.5 mg twice daily. Start low-dose hydralazine for afterload reduction. If diuresis and basic medication adjustments are not effective, may need to be considered for inotropic therapy, although this would be a temporary measure, and he is otherwise not a good candidate for advanced heart failure treatment strategies.  Satira Sark, M.D., F.A.C.C.

## 2015-03-17 NOTE — ED Notes (Signed)
Pt eating lunch currently with family at bedside.

## 2015-03-17 NOTE — Progress Notes (Signed)
Initial Nutrition Assessment  DOCUMENTATION CODES:  Not applicable  INTERVENTION:  Ensure Enlive po BID, each supplement provides 350 kcal and 20 grams of protein  NUTRITION DIAGNOSIS:  Inadequate oral intake related to frequent hospitalizations/bouts of acute illness as evidenced by per patient/family report.  GOAL:  Patient will meet greater than or equal to 90% of their needs  MONITOR:  PO intake, Supplement acceptance, Labs, Weight trends, I & O's  REASON FOR ASSESSMENT:  Malnutrition Screening Tool    ASSESSMENT:  82 y/o male PMHx Dementia, Cad, GERD, Prostate Cancer, CKD, chf, ckd3-4, Pre DM, HTN presents with intermittent confusion 4-5 days and SOB x 3 months. Admitted/treated for acute CHF exacerbation.   Spoke with pt and daughter. Per MD note, poor compliance is suspected. Went through what pt eats. They report that patient does not eat Frozen meals, canned soups, ham as he cut much of this out a while back. They report pt's favorite food is hamburger, which he eats frequently. They state it is prepared with no salt.  They did mention that he eats some bologna and cheese, but they felt pt ate these relatively little. It is difficult to truly know what pt eats because daughter does not live with pt and pt is slightly confused.   The family does report that it is possible pt has been over consuming fluids. They mention how he does drink soda, cofee (decaf) throughout day. Pt also drinks Ensure frequently (atleast 1 each day).   Pt has lost ~10 lbs in the last year. Family reports pt complains frequently of gastric pain, however this has been going on for >2 years. More suspect is his frequent acute illnesses as they report he has either been admitted or gone to the ED roughly 5x in the last year and believe these have taken a toll on pt's intake/weight. Pt stated he has had diarrhea x3 months, but family does not believe this is accurate as they state pt had just prior complained  of constipation. Family unable to quantify amount of food/meals pt consumes.   Pt was able to state his UBW is ~165 lbs. He thought he weighed 145 now.   NFPE: Mild muscle wasting,   Diet Order:  Diet Heart Room service appropriate?: Yes; Fluid consistency:: Thin  Skin:  Reviewed, no issues  Last BM:  Unknown  Height:  Ht Readings from Last 1 Encounters:  03/17/15 5\' 8"  (1.727 m)   Weight:  Wt Readings from Last 1 Encounters:  03/17/15 155 lb 4.8 oz (70.444 kg)   Wt Readings from Last 10 Encounters:  03/17/15 155 lb 4.8 oz (70.444 kg)  12/20/14 154 lb 9.6 oz (70.126 kg)  12/13/14 155 lb 4.8 oz (70.444 kg)  10/31/14 167 lb 1.7 oz (75.8 kg)  05/30/14 150 lb 4.8 oz (68.176 kg)  05/17/14 164 lb (74.39 kg)  05/17/14 158 lb (71.668 kg)  05/10/14 189 lb (85.73 kg)  04/22/14 165 lb (74.844 kg)  04/04/14 160 lb (72.576 kg)  Admit weight 154 lbs   Ideal Body Weight:  70 kg  BMI:  Body mass index is 23.62 kg/(m^2).  Estimated Nutritional Needs:  Kcal:  1600-1800 (23-26 kcal/kg bw) Protein:  42-56 g pro (.6-.8 g/kg bw) Fluid:  1.6-1.8 liters or per MD  EDUCATION NEEDS:  Education needs addressed  Burtis Junes RD, LDN Nutrition Pager: [phone] 03/17/2015 5:46 PM

## 2015-03-17 NOTE — ED Provider Notes (Signed)
CSN: [id number]     Arrival date & time 03/17/15  [id number] History   First MD Initiated Contact with Patient 03/17/15 [phone]     Chief Complaint  Patient presents with  . Leg Swelling     (Consider location/radiation/quality/duration/timing/severity/associated sxs/prior Treatment) Patient is a 82 y.o. male presenting with weakness. The history is provided by the patient (Patient complains of swelling in his legs and weakness for a few days. Occasional chest discomfort).  Weakness This is a new problem. The current episode started 3 to 5 hours ago. The problem occurs constantly. The problem has been resolved. Associated symptoms include chest pain. Pertinent negatives include no abdominal pain and no headaches. Nothing aggravates the symptoms. Nothing relieves the symptoms.    Past Medical History  Diagnosis Date  . Arthritis   . Gout   . Non-ischemic cardiomyopathy (HCC)     LVEF 10-15%  . Essential hypertension   . CAD (coronary artery disease)     Nonobstructive 2004  . COPD (chronic obstructive pulmonary disease) ([place])   . History of syphilis   . Type 2 HSV infection of penis   . Erectile dysfunction   . Anxiety disorder   . Dementia   . GERD (gastroesophageal reflux disease)   . Ascending aortic aneurysm ([place])   . Lung nodule     RUL nodule noted on abdominal CT  . Automatic implantable cardioverter-defibrillator in situ 07/20/2008  . Chronic systolic heart failure ([place])   . Prostate cancer ([place])     Diagnosed in 01/2013 by Dr Michela Pitcher adenocarcinoma felt to be localized, gleason score 6 with a pSA level of 8.4. No bone scan by Dr Michela Pitcher, referred to oncology, no radiation treatment offered    . CKD (chronic kidney disease) stage 3, GFR 30-59 ml/min    Past Surgical History  Procedure Laterality Date  . Ankle surgery Left   . Insertion of icd  02/19/05  . Crtd upgrade  12/14/2009    [employer]  . Esophagogastroduodenoscopy N/A 07/01/2013    Procedure: ESOPHAGOGASTRODUODENOSCOPY  (EGD);  Surgeon: Rogene Houston, MD;  [facility];  Service: Endoscopy;  Laterality: N/A;  145-moved to 1245 Ann notfied pt  . Esophagogastroduodenoscopy N/A 10/28/2014    Procedure: ESOPHAGOGASTRODUODENOSCOPY (EGD);  Surgeon: Rogene Houston, MD;  Location: [facility];  Service: Endoscopy;  Laterality: N/A;  . Colonoscopy N/A 10/29/2014    Procedure: COLONOSCOPY;  Surgeon: Rogene Houston, MD;  Location: [facility];  Service: Endoscopy;  Laterality: N/A;   Family History  Problem Relation Age of Onset  . Heart failure Father   . Prostate cancer Brother   . Diabetes Brother   . Hypertension Brother   . Heart failure Brother    Social History  Substance Use Topics  . Smoking status: Former Smoker -- 1.00 packs/day for 10 years    Types: Cigarettes    Quit date: 01/15/1963  . Smokeless tobacco: Never Used  . Alcohol Use: No    Review of Systems  Constitutional: Negative for appetite change and fatigue.  HENT: Negative for congestion, ear discharge and sinus pressure.   Eyes: Negative for discharge.  Respiratory: Negative for cough.   Cardiovascular: Positive for chest pain.  Gastrointestinal: Negative for abdominal pain and diarrhea.  Genitourinary: Negative for frequency and hematuria.  Musculoskeletal: Negative for back pain.  Skin: Negative for rash.  Neurological: Positive for weakness. Negative for seizures and headaches.  Psychiatric/Behavioral: Negative for hallucinations.      Allergies  Codeine; Enalapril maleate; and Sulfonamide derivatives  Home Medications   Prior to Admission medications   Medication Sig Start Date End Date Taking? Authorizing Provider  acetaminophen (TYLENOL) 500 MG tablet Take 1,000 mg by mouth daily as needed for moderate pain.     Historical Provider, MD  allopurinol (ZYLOPRIM) 100 MG tablet Take 1 tablet (100 mg total) by mouth daily. 11/03/13   Fayrene Helper, MD  ALPRAZolam Duanne Moron) 0.25 MG tablet Take 0.25 mg by  mouth 2 (two) times daily as needed for anxiety.  10/24/14   Historical Provider, MD  Ascorbic Acid (VITAMIN C PO) Take 1 tablet by mouth daily.    Historical Provider, MD  benzonatate (TESSALON PERLES) 100 MG capsule Take 2 capsules (200 mg total) by mouth 3 (three) times daily. 12/13/14   Sinda Du, MD  carvedilol (COREG) 3.125 MG tablet Take 3.125 mg by mouth 2 (two) times daily with a meal.    Historical Provider, MD  donepezil (ARICEPT) 5 MG tablet Take 1 tablet (5 mg total) by mouth at bedtime. 10/31/14   Sinda Du, MD  doxycycline (VIBRA-TABS) 100 MG tablet Take 1 tablet (100 mg total) by mouth every 12 (twelve) hours. 12/13/14   Sinda Du, MD  ferrous sulfate (FERROUSUL) 325 (65 FE) MG tablet Take 1 tablet (325 mg total) by mouth daily with breakfast. 10/31/14   Sinda Du, MD  furosemide (LASIX) 40 MG tablet Take 1 tablet (40 mg total) by mouth 2 (two) times daily. 11/03/13   Fayrene Helper, MD  HYDROcodone-acetaminophen (NORCO) 5-325 MG per tablet Take 2 tablets by mouth every 4 (four) hours as needed. Patient taking differently: Take 2 tablets by mouth every 4 (four) hours as needed for moderate pain.  05/17/14   Elnora Morrison, MD  indomethacin (INDOCIN) 25 MG capsule Take 25 mg by mouth 2 (two) times daily as needed for mild pain or moderate pain.  10/29/14   Historical Provider, MD  isosorbide dinitrate (ISORDIL) 5 MG tablet Take 5 mg by mouth 2 (two) times daily.    Historical Provider, MD  LORazepam (ATIVAN) 0.5 MG tablet Take 1 tablet (0.5 mg total) by mouth every 6 (six) hours as needed for anxiety. 10/31/14   Sinda Du, MD  losartan (COZAAR) 25 MG tablet Take 1 tablet (25 mg total) by mouth daily. 11/03/13   Fayrene Helper, MD  mirtazapine (REMERON) 15 MG tablet TAKE 1 TABLET BY MOUTH AT BEDTIME 06/17/14   Fayrene Helper, MD  nitroGLYCERIN (NITROSTAT) 0.4 MG SL tablet Place 1 tablet (0.4 mg total) under the tongue every 5 (five) minutes as needed for  chest pain. For chest pain 03/18/14   Evans Lance, MD  omeprazole (PRILOSEC) 20 MG capsule Take 1 capsule (20 mg total) by mouth 2 (two) times daily before a meal. 10/31/14   Sinda Du, MD  ondansetron (ZOFRAN ODT) 4 MG disintegrating tablet Take 1 tablet (4 mg total) by mouth 2 (two) times daily. 10/31/14   Sinda Du, MD  OXYGEN Inhale 2 L into the lungs at bedtime. May use daily as needed    Historical Provider, MD  sertraline (ZOLOFT) 50 MG tablet Take 50 mg by mouth daily. 04/11/14   Historical Provider, MD  spironolactone (ALDACTONE) 25 MG tablet Take 1 tablet (25 mg total) by mouth daily. 11/03/13   Fayrene Helper, MD  temazepam (RESTORIL) 30 MG capsule Take 30 mg by mouth at bedtime.    Historical Provider, MD   BP 90/70  mmHg  Pulse 64  Temp(Src) 97.6 F (36.4 C) (Axillary)  Resp 17  Ht 5\' 7"  (1.702 m)  Wt 154 lb (69.854 kg)  BMI 24.11 kg/m2  SpO2 92% Physical Exam  Constitutional: He is oriented to person, place, and time. He appears well-developed.  HENT:  Head: Normocephalic.  Eyes: Conjunctivae and EOM are normal. No scleral icterus.  Neck: Neck supple. No thyromegaly present.  Cardiovascular: Normal rate and regular rhythm.  Exam reveals no gallop and no friction rub.   No murmur heard. Pulmonary/Chest: No stridor. He has no wheezes. He has no rales. He exhibits no tenderness.  Abdominal: He exhibits no distension. There is no tenderness. There is no rebound.  Musculoskeletal: Normal range of motion. He exhibits edema.  Lymphadenopathy:    He has no cervical adenopathy.  Neurological: He is oriented to person, place, and time. He exhibits normal muscle tone. Coordination normal.  Skin: No rash noted. No erythema.  Psychiatric: He has a normal mood and affect. His behavior is normal.    ED Course  Procedures (including critical care time) Labs Review Labs Reviewed  CBC - Abnormal; Notable for the following:    RBC 3.85 (*)    Hemoglobin 10.8 (*)     HCT 33.5 (*)    RDW 15.7 (*)    All other components within normal limits  COMPREHENSIVE METABOLIC PANEL - Abnormal; Notable for the following:    Glucose, Bld 115 (*)    BUN 48 (*)    Creatinine, Ser 2.44 (*)    ALT 11 (*)    GFR calc non Af Amer 23 (*)    GFR calc Af Amer 27 (*)    All other components within normal limits  TROPONIN I - Abnormal; Notable for the following:    Troponin I 1.16 (*)    All other components within normal limits  BRAIN NATRIURETIC PEPTIDE - Abnormal; Notable for the following:    B Natriuretic Peptide 2690.0 (*)    All other components within normal limits    Imaging Review Dg Chest 2 View  03/17/2015  CLINICAL DATA:  Shortness of breath and bilateral lower extremity swelling for 2 weeks EXAM: CHEST  2 VIEW COMPARISON:  12/10/2014 FINDINGS: Cardiac shadow remains enlarged. A defibrillator is again seen and stable. No focal infiltrate or sizable effusion is noted. No bony abnormality is seen. IMPRESSION: No acute abnormality noted. Electronically Signed   By: Inez Catalina M.D.   On: 03/17/2015 08:25   Ct Head Wo Contrast  03/17/2015  CLINICAL DATA:  Altered mental status/confusion. EXAM: CT HEAD WITHOUT CONTRAST TECHNIQUE: Contiguous axial images were obtained from the base of the skull through the vertex without intravenous contrast. COMPARISON:  Head CT -05/29/2009; 12/21/2007 FINDINGS: Similar findings of mild likely age-appropriate atrophy with sulcal prominence. Scattered periventricular hypodensities compatible with microvascular ischemic disease are grossly unchanged. No CT evidence of superimposed acute large territory infarct. No intraparenchymal extra-axial mass or hemorrhage. Unchanged size and configuration of the ventricles and basilar cisterns. No midline shift. Limited visualization of the paranasal sinuses and mastoid air cells is normal. No air-fluid levels. Regional soft tissues appear normal. No displaced calvarial fracture. IMPRESSION: Similar  findings of atrophy and microvascular ischemic disease without acute intracranial process. Electronically Signed   By: Sandi Mariscal M.D.   On: 03/17/2015 08:22   I have personally reviewed and evaluated these images and lab results as part of my medical decision-making.   EKG Interpretation   Date/Time:  Friday March 17 2015 07:00:04 EST Ventricular Rate:  71 PR Interval:  226 QRS Duration: 170 QT Interval:  499 QTC Calculation: 542 R Axis:   -68 Text Interpretation:  Sinus rhythm Prolonged PR interval Probable left  atrial enlargement IVCD, consider atypical RBBB Abnormal T, consider  ischemia, lateral leads Confirmed by Daelynn Blower  MD, Letesha Klecker ([id number]) on  03/17/2015 7:25:18 AM      MDM   Final diagnoses:  Systolic congestive heart failure, unspecified congestive heart failure chronicity ([place])    Patient with congestive heart failure. And anasarca. Patient will be admitted for diuresis    Milton Ferguson, MD 03/17/15 [id number]

## 2015-03-18 ENCOUNTER — Inpatient Hospital Stay (HOSPITAL_COMMUNITY): Payer: Commercial Managed Care - HMO

## 2015-03-18 DIAGNOSIS — I509 Heart failure, unspecified: Secondary | ICD-10-CM

## 2015-03-18 LAB — BASIC METABOLIC PANEL
Anion gap: 9 (ref 5–15)
BUN: 47 mg/dL — AB (ref 6–20)
CALCIUM: 9.3 mg/dL (ref 8.9–10.3)
CO2: 28 mmol/L (ref 22–32)
CREATININE: 2.06 mg/dL — AB (ref 0.61–1.24)
Chloride: 108 mmol/L (ref 101–111)
GFR calc Af Amer: 33 mL/min — ABNORMAL LOW (ref >60)
GFR, EST NON AFRICAN AMERICAN: 29 mL/min — AB (ref >60)
GLUCOSE: 105 mg/dL — AB (ref 65–99)
Potassium: 3.8 mmol/L (ref 3.5–5.1)
SODIUM: 145 mmol/L (ref 135–145)

## 2015-03-18 LAB — TROPONIN I
TROPONIN I: 1.15 ng/mL — AB (ref <0.031)
Troponin I: 1.12 ng/mL (ref <0.031)

## 2015-03-18 NOTE — Progress Notes (Signed)
Subjective: [name] Macias was admitted yesterday after presenting with shortness of breath and confusion. He was seen in consultation by cardiology. CT scan of the brain revealed atrophy and small vessel changes only. He had an elevated proBNP and significant lower extremity edema but no pulmonary edema by chest x-ray. He was treated with IV Lasix. He states that he is feeling better today and denies shortness of breath at present.  Objective: Vital signs in last 24 hours: Filed Vitals:   03/17/15 1530 03/17/15 1701 03/17/15 2143 03/18/15 0540  BP: 102/66 90/56 90/44  94/54  Pulse: 60 72 70 67  Temp:  97.9 F (36.6 C) 98.1 F (36.7 C) 97.7 F (36.5 C)  TempSrc:  Oral Oral Oral  Resp: 20 20 20 18   Height:  5\' 8"  (1.727 m)    Weight:  155 lb 4.8 oz (70.444 kg)  162 lb 4.1 oz (73.6 kg)  SpO2: 100% 100% 98% 100%   Weight change:   Intake/Output Summary (Last 24 hours) at 03/18/15 0948 Last data filed at 03/17/15 2151  Gross per 24 hour  Intake      0 ml  Output    725 ml  Net   -725 ml    Physical Exam: Alert. Breathing comfortably. Lungs clear. Heart regular with a grade 1 systolic murmur. Abdomen soft and nontender. Extremities reveal 1+ edema in the lower legs. Mild confusion noted.  Lab Results:    Results for orders placed or performed during the hospital encounter of 03/17/15 (from the past 24 hour(s))  Ammonia     Status: None   Collection Time: 03/17/15 11:00 AM  Result Value Ref Range   Ammonia 12 9 - 35 umol/L  Troponin I (q 6hr x 3)     Status: Abnormal   Collection Time: 03/17/15  6:06 PM  Result Value Ref Range   Troponin I 1.14 (HH) <0.031 ng/mL  Glucose, capillary     Status: Abnormal   Collection Time: 03/17/15  8:12 PM  Result Value Ref Range   Glucose-Capillary 125 (H) 65 - 99 mg/dL  Troponin I (q 6hr x 3)     Status: Abnormal   Collection Time: 03/17/15 11:20 PM  Result Value Ref Range   Troponin I 1.15 (HH) <0.031 ng/mL  Basic metabolic panel     Status:  Abnormal   Collection Time: 03/18/15  6:23 AM  Result Value Ref Range   Sodium 145 135 - 145 mmol/L   Potassium 3.8 3.5 - 5.1 mmol/L   Chloride 108 101 - 111 mmol/L   CO2 28 22 - 32 mmol/L   Glucose, Bld 105 (H) 65 - 99 mg/dL   BUN 47 (H) 6 - 20 mg/dL   Creatinine, Ser 2.06 (H) 0.61 - 1.24 mg/dL   Calcium 9.3 8.9 - 10.3 mg/dL   GFR calc non Af Amer 29 (L) >60 mL/min   GFR calc Af Amer 33 (L) >60 mL/min   Anion gap 9 5 - 15  Troponin I (q 6hr x 3)     Status: Abnormal   Collection Time: 03/18/15  6:23 AM  Result Value Ref Range   Troponin I 1.12 (HH) <0.031 ng/mL     ABGS No results for input(s): PHART, PO2ART, TCO2, HCO3 in the last 72 hours.  Invalid input(s): PCO2 CULTURES No results found for this or any previous visit (from the past 240 hour(s)). Studies/Results: Dg Chest 2 View  03/17/2015  CLINICAL DATA:  Shortness of breath and bilateral lower  extremity swelling for 2 weeks EXAM: CHEST  2 VIEW COMPARISON:  12/10/2014 FINDINGS: Cardiac shadow remains enlarged. A defibrillator is again seen and stable. No focal infiltrate or sizable effusion is noted. No bony abnormality is seen. IMPRESSION: No acute abnormality noted. Electronically Signed   By: Inez Catalina M.D.   On: 03/17/2015 08:25   Ct Head Wo Contrast  03/17/2015  CLINICAL DATA:  Altered mental status/confusion. EXAM: CT HEAD WITHOUT CONTRAST TECHNIQUE: Contiguous axial images were obtained from the base of the skull through the vertex without intravenous contrast. COMPARISON:  Head CT -05/29/2009; 12/21/2007 FINDINGS: Similar findings of mild likely age-appropriate atrophy with sulcal prominence. Scattered periventricular hypodensities compatible with microvascular ischemic disease are grossly unchanged. No CT evidence of superimposed acute large territory infarct. No intraparenchymal extra-axial mass or hemorrhage. Unchanged size and configuration of the ventricles and basilar cisterns. No midline shift. Limited  visualization of the paranasal sinuses and mastoid air cells is normal. No air-fluid levels. Regional soft tissues appear normal. No displaced calvarial fracture. IMPRESSION: Similar findings of atrophy and microvascular ischemic disease without acute intracranial process. Electronically Signed   By: Sandi Mariscal M.D.   On: 03/17/2015 08:22   Micro Results: No results found for this or any previous visit (from the past 240 hour(s)). Studies/Results: Dg Chest 2 View  03/17/2015  CLINICAL DATA:  Shortness of breath and bilateral lower extremity swelling for 2 weeks EXAM: CHEST  2 VIEW COMPARISON:  12/10/2014 FINDINGS: Cardiac shadow remains enlarged. A defibrillator is again seen and stable. No focal infiltrate or sizable effusion is noted. No bony abnormality is seen. IMPRESSION: No acute abnormality noted. Electronically Signed   By: Inez Catalina M.D.   On: 03/17/2015 08:25   Ct Head Wo Contrast  03/17/2015  CLINICAL DATA:  Altered mental status/confusion. EXAM: CT HEAD WITHOUT CONTRAST TECHNIQUE: Contiguous axial images were obtained from the base of the skull through the vertex without intravenous contrast. COMPARISON:  Head CT -05/29/2009; 12/21/2007 FINDINGS: Similar findings of mild likely age-appropriate atrophy with sulcal prominence. Scattered periventricular hypodensities compatible with microvascular ischemic disease are grossly unchanged. No CT evidence of superimposed acute large territory infarct. No intraparenchymal extra-axial mass or hemorrhage. Unchanged size and configuration of the ventricles and basilar cisterns. No midline shift. Limited visualization of the paranasal sinuses and mastoid air cells is normal. No air-fluid levels. Regional soft tissues appear normal. No displaced calvarial fracture. IMPRESSION: Similar findings of atrophy and microvascular ischemic disease without acute intracranial process. Electronically Signed   By: Sandi Mariscal M.D.   On: 03/17/2015 08:22   Medications:   I have reviewed the patient's current medications Scheduled Meds: . allopurinol  100 mg Oral Daily  . donepezil  5 mg Oral QHS  . enoxaparin (LOVENOX) injection  30 mg Subcutaneous Q24H  . feeding supplement (ENSURE ENLIVE)  237 mL Oral BID BM  . ferrous sulfate  325 mg Oral Q breakfast  . furosemide  40 mg Oral BID  . isosorbide dinitrate  5 mg Oral BID  . mirtazapine  15 mg Oral QHS  . ondansetron  4 mg Oral BID  . pantoprazole  40 mg Oral Daily  . polyethylene glycol  17 g Oral Daily  . senna-docusate  1 tablet Oral QHS  . sertraline  50 mg Oral Daily  . sodium chloride flush  3 mL Intravenous Q12H  . vitamin C  500 mg Oral Daily   Continuous Infusions:  PRN Meds:.sodium chloride, acetaminophen, ALPRAZolam, ondansetron (ZOFRAN) IV, sodium  chloride flush, zolpidem   Assessment/Plan: #1. Acute on chronic systolic and diastolic heart failure. Previous echo reveals an EF of 15%. ICD in place. Continue Lasix. Spironolactone and losartan were discontinued on admission. He appears significantly improved after one day. #2. Chronic kidney disease stage III. BUN and creatinine are improved at 47 and 2.06. Potassium is 3.8. #3. Dementia. Continue Aricept. #4. Mild troponin elevation likely consistent with demand ischemia. Levels are stable at 1.1. #5. Anemia. Hemoglobin 10.8. Principal Problem:   Acute heart failure (HCC) Active Problems:   INSOMNIA   Prostate cancer ([place])   Ascending aortic aneurysm (HCC)   Lung nodule   Stage III chronic kidney disease   GI (gastrointestinal bleed)   Dementia with behavioral disturbance   CHF (congestive heart failure) ([place])     LOS: 1 day   [name] Macias 03/18/2015, 9:48 AM

## 2015-03-19 ENCOUNTER — Encounter (HOSPITAL_COMMUNITY): Payer: Self-pay

## 2015-03-19 LAB — BASIC METABOLIC PANEL
ANION GAP: 8 (ref 5–15)
BUN: 43 mg/dL — ABNORMAL HIGH (ref 6–20)
CHLORIDE: 109 mmol/L (ref 101–111)
CO2: 28 mmol/L (ref 22–32)
CREATININE: 1.64 mg/dL — AB (ref 0.61–1.24)
Calcium: 9.1 mg/dL (ref 8.9–10.3)
GFR calc non Af Amer: 38 mL/min — ABNORMAL LOW (ref >60)
GFR, EST AFRICAN AMERICAN: 44 mL/min — AB (ref >60)
Glucose, Bld: 90 mg/dL (ref 65–99)
POTASSIUM: 3.8 mmol/L (ref 3.5–5.1)
SODIUM: 145 mmol/L (ref 135–145)

## 2015-03-19 NOTE — Progress Notes (Signed)
Subjective: [name] Macias notes significant improvement in his shortness of breath and lower extremity swelling.  Objective: Vital signs in last 24 hours: Filed Vitals:   03/18/15 0540 03/18/15 1418 03/18/15 2159 03/19/15 0604  BP: 94/54 96/54 113/61 110/66  Pulse: 67 62 74 75  Temp: 97.7 F (36.5 C) 98.1 F (36.7 C) 98.3 F (36.8 C) 98.4 F (36.9 C)  TempSrc: Oral  Oral Oral  Resp: 18 18 18 18   Height:      Weight: 162 lb 4.1 oz (73.6 kg)   155 lb 4.8 oz (70.444 kg)  SpO2: 100% 99% 98% 100%   Weight change: 1 lb 4.8 oz (0.59 kg)  Intake/Output Summary (Last 24 hours) at 03/19/15 0924 Last data filed at 03/18/15 1700  Gross per 24 hour  Intake    480 ml  Output      0 ml  Net    480 ml    Physical Exam: Alert. Breathing comfortably. Lungs clear. Heart regular with a grade 2 systolic murmur. Abdomen soft and nontender. Extremities reveal minimal residual edema in the lower legs.  Lab Results:    Results for orders placed or performed during the hospital encounter of 03/17/15 (from the past 24 hour(s))  Basic metabolic panel     Status: Abnormal   Collection Time: 03/19/15  6:06 AM  Result Value Ref Range   Sodium 145 135 - 145 mmol/L   Potassium 3.8 3.5 - 5.1 mmol/L   Chloride 109 101 - 111 mmol/L   CO2 28 22 - 32 mmol/L   Glucose, Bld 90 65 - 99 mg/dL   BUN 43 (H) 6 - 20 mg/dL   Creatinine, Ser 1.64 (H) 0.61 - 1.24 mg/dL   Calcium 9.1 8.9 - 10.3 mg/dL   GFR calc non Af Amer 38 (L) >60 mL/min   GFR calc Af Amer 44 (L) >60 mL/min   Anion gap 8 5 - 15     ABGS No results for input(s): PHART, PO2ART, TCO2, HCO3 in the last 72 hours.  Invalid input(s): PCO2 CULTURES No results found for this or any previous visit (from the past 240 hour(s)). Studies/Results: No results found. Micro Results: No results found for this or any previous visit (from the past 240 hour(s)). Studies/Results: No results found. Medications:  I have reviewed the patient's current  medications Scheduled Meds: . allopurinol  100 mg Oral Daily  . donepezil  5 mg Oral QHS  . enoxaparin (LOVENOX) injection  30 mg Subcutaneous Q24H  . feeding supplement (ENSURE ENLIVE)  237 mL Oral BID BM  . ferrous sulfate  325 mg Oral Q breakfast  . furosemide  40 mg Oral BID  . isosorbide dinitrate  5 mg Oral BID  . mirtazapine  15 mg Oral QHS  . ondansetron  4 mg Oral BID  . pantoprazole  40 mg Oral Daily  . polyethylene glycol  17 g Oral Daily  . senna-docusate  1 tablet Oral QHS  . sertraline  50 mg Oral Daily  . sodium chloride flush  3 mL Intravenous Q12H  . vitamin C  500 mg Oral Daily   Continuous Infusions:  PRN Meds:.sodium chloride, acetaminophen, ALPRAZolam, ondansetron (ZOFRAN) IV, sodium chloride flush, zolpidem   Assessment/Plan: #1. Acute on chronic systolic heart failure. Repeat echo reveals severely decreased systolic function with an LVEF of 20-25%. Continue Lasix. #2. Chronic kidney disease stage III. BUN and creatinine are 43 and 1.64. #3. Confusion. History of dementia. He appears at baseline  neurologically. Principal Problem:   Acute heart failure (HCC) Active Problems:   INSOMNIA   Prostate cancer ([place])   Ascending aortic aneurysm (HCC)   Lung nodule   Stage III chronic kidney disease   GI (gastrointestinal bleed)   Dementia with behavioral disturbance   CHF (congestive heart failure) ([place])     LOS: 2 days   [name] Macias 03/19/2015, 9:24 AM

## 2015-03-20 ENCOUNTER — Ambulatory Visit (INDEPENDENT_AMBULATORY_CARE_PROVIDER_SITE_OTHER): Payer: Commercial Managed Care - HMO | Admitting: Internal Medicine

## 2015-03-20 LAB — BASIC METABOLIC PANEL
Anion gap: 6 (ref 5–15)
BUN: 36 mg/dL — AB (ref 6–20)
CALCIUM: 9.1 mg/dL (ref 8.9–10.3)
CHLORIDE: 108 mmol/L (ref 101–111)
CO2: 30 mmol/L (ref 22–32)
CREATININE: 1.56 mg/dL — AB (ref 0.61–1.24)
GFR calc non Af Amer: 40 mL/min — ABNORMAL LOW (ref >60)
GFR, EST AFRICAN AMERICAN: 47 mL/min — AB (ref >60)
GLUCOSE: 98 mg/dL (ref 65–99)
Potassium: 4 mmol/L (ref 3.5–5.1)
Sodium: 144 mmol/L (ref 135–145)

## 2015-03-20 MED ORDER — DONEPEZIL HCL 5 MG PO TABS: 5.0000 mg | ORAL_TABLET | Freq: Every day | ORAL | Status: DC

## 2015-03-20 MED ORDER — ALLOPURINOL 100 MG PO TABS: 100.0000 mg | ORAL_TABLET | Freq: Every day | ORAL | Status: AC

## 2015-03-20 NOTE — Discharge Summary (Signed)
Physician Discharge Summary  Patient ID: [name] Macias MRN: [id number] DOB/AGE: 80/20/1934 80 y.o. Primary Care Physician:Tyliyah Mcmeekin L, MD Admit date: 03/17/2015 Discharge date: 03/20/2015    Discharge Diagnoses:   Principal Problem:   Acute heart failure ([place]) Active Problems:   INSOMNIA   Prostate cancer ([place])   Ascending aortic aneurysm (HCC)   Lung nodule   Stage III chronic kidney disease   GI (gastrointestinal bleed)   Dementia with behavioral disturbance   Acute on chronic systolic heart failure (HCC)   CHF (congestive heart failure) ([place])  COPD   Medication List    STOP taking these medications        indomethacin 25 MG capsule  Commonly known as:  INDOCIN      TAKE these medications        acetaminophen 500 MG tablet  Commonly known as:  TYLENOL  Take 1,000 mg by mouth daily as needed for moderate pain.     allopurinol 100 MG tablet  Commonly known as:  ZYLOPRIM  Take 1 tablet (100 mg total) by mouth daily.     ALPRAZolam 0.25 MG tablet  Commonly known as:  XANAX  Take 0.25 mg by mouth 2 (two) times daily as needed for anxiety.     carvedilol 25 MG tablet  Commonly known as:  COREG  Take 25 mg by mouth 2 (two) times daily with a meal.     donepezil 5 MG tablet  Commonly known as:  ARICEPT  Take 1 tablet (5 mg total) by mouth at bedtime.     ferrous sulfate 325 (65 FE) MG tablet  Commonly known as:  FERROUSUL  Take 1 tablet (325 mg total) by mouth daily with breakfast.     furosemide 40 MG tablet  Commonly known as:  LASIX  Take 1 tablet (40 mg total) by mouth 2 (two) times daily.     isosorbide dinitrate 5 MG tablet  Commonly known as:  ISORDIL  Take 5 mg by mouth 2 (two) times daily.     losartan 25 MG tablet  Commonly known as:  COZAAR  Take 1 tablet (25 mg total) by mouth daily.     magnesium citrate Soln  Take 1 Bottle by mouth once.     nitroGLYCERIN 0.4 MG SL tablet  Commonly known as:  NITROSTAT  Place 1 tablet (0.4 mg total)  under the tongue every 5 (five) minutes as needed for chest pain. For chest pain     ondansetron 4 MG disintegrating tablet  Commonly known as:  ZOFRAN-ODT  Take 4 mg by mouth 3 (three) times daily as needed. For nausea and vomiting     oxyCODONE-acetaminophen 5-325 MG tablet  Commonly known as:  PERCOCET/ROXICET  Take 1 tablet by mouth 4 (four) times daily.     OXYGEN  Inhale 2 L into the lungs at bedtime. May use daily as needed     pantoprazole 40 MG tablet  Commonly known as:  PROTONIX  Take 40 mg by mouth daily.     sertraline 50 MG tablet  Commonly known as:  ZOLOFT  Take 50 mg by mouth daily.     spironolactone 25 MG tablet  Commonly known as:  ALDACTONE  Take 1 tablet (25 mg total) by mouth daily.     temazepam 30 MG capsule  Commonly known as:  RESTORIL  Take 30 mg by mouth at bedtime.     VITAMIN C PO  Take 1 tablet by mouth daily.  Discharged Condition: Improved    Consults: Cardiology  Significant Diagnostic Studies: Dg Chest 2 View  03/17/2015  CLINICAL DATA:  Shortness of breath and bilateral lower extremity swelling for 2 weeks EXAM: CHEST  2 VIEW COMPARISON:  12/10/2014 FINDINGS: Cardiac shadow remains enlarged. A defibrillator is again seen and stable. No focal infiltrate or sizable effusion is noted. No bony abnormality is seen. IMPRESSION: No acute abnormality noted. Electronically Signed   By: Inez Catalina M.D.   On: 03/17/2015 08:25   Ct Head Wo Contrast  03/17/2015  CLINICAL DATA:  Altered mental status/confusion. EXAM: CT HEAD WITHOUT CONTRAST TECHNIQUE: Contiguous axial images were obtained from the base of the skull through the vertex without intravenous contrast. COMPARISON:  Head CT -05/29/2009; 12/21/2007 FINDINGS: Similar findings of mild likely age-appropriate atrophy with sulcal prominence. Scattered periventricular hypodensities compatible with microvascular ischemic disease are grossly unchanged. No CT evidence of superimposed acute  large territory infarct. No intraparenchymal extra-axial mass or hemorrhage. Unchanged size and configuration of the ventricles and basilar cisterns. No midline shift. Limited visualization of the paranasal sinuses and mastoid air cells is normal. No air-fluid levels. Regional soft tissues appear normal. No displaced calvarial fracture. IMPRESSION: Similar findings of atrophy and microvascular ischemic disease without acute intracranial process. Electronically Signed   By: Sandi Mariscal M.D.   On: 03/17/2015 08:22    Lab Results: Basic Metabolic Panel:  Recent Labs  03/19/15 0606 03/20/15 0623  NA 145 144  K 3.8 4.0  CL 109 108  CO2 28 30  GLUCOSE 90 98  BUN 43* 36*  CREATININE 1.64* 1.56*  CALCIUM 9.1 9.1   Liver Function Tests: No results for input(s): AST, ALT, ALKPHOS, BILITOT, PROT, ALBUMIN in the last 72 hours.   CBC: No results for input(s): WBC, NEUTROABS, HGB, HCT, MCV, PLT in the last 72 hours.  No results found for this or any previous visit (from the past 240 hour(s)).   Hospital Course: This is an 82 year old who came to the emergency department because of increasing shortness of breath. He was found to have acute on chronic systolic heart failure. He improved over the next 72 hours and was back at baseline and ready for discharge. He also has COPD at baseline. He has dementia which complicates his situation.  Discharge Exam: Blood pressure 99/61, pulse 77, temperature 98.1 F (36.7 C), temperature source Oral, resp. rate 18, height 5\' 8"  (1.727 m), weight 70.12 kg (154 lb 9.4 oz), SpO2 100 %. He is awake and alert. His chest is clear. He is in no respiratory distress. He has no edema.  Disposition: Discharge home with home health services      Discharge Instructions    Discharge patient    Complete by:  As directed      Face-to-face encounter (required for Medicare/Medicaid patients)    Complete by:  As directed   I Robbin Escher L certify that this patient is  under my care and that I, or a nurse practitioner or physician's assistant working with me, had a face-to-face encounter that meets the physician face-to-face encounter requirements with this patient on 03/20/2015. The encounter with the patient was in whole, or in part for the following medical condition(s) which is the primary reason for home health care (List medical condition): Acute on chronic systolic heart failure  The encounter with the patient was in whole, or in part, for the following medical condition, which is the primary reason for home health care:  Acute on chronic systolic  heart failure  I certify that, based on my findings, the following services are medically necessary home health services:   Nursing Physical therapy    Reason for Medically Necessary Home Health Services:  Skilled Nursing- Change/Decline in Patient Status  My clinical findings support the need for the above services:  Shortness of breath with activity  Further, I certify that my clinical findings support that this patient is homebound due to:  Shortness of Breath with activity     Home Health    Complete by:  As directed   To provide the following care/treatments:   RN PT    Please check basic metabolic profile on [id number]             Signed: Lawren Sexson L   03/20/2015, 8:44 AM

## 2015-03-20 NOTE — Care Management Note (Signed)
Case Management Note  Patient Details  Name: [name] Macias MRN: [id number] Date of Birth: 1932-02-05  Subjective/Objective:                  Pt is from home with wife. Pt is ind with ADL's. Pt is active with [facility] for [facility] nursing services. Pt has home O2 with concentrator and portable tanks. Pt would like to resume [place] services through [facility] at DC. Pt aware HH has 48 hours to resume services.   Action/Plan: Pt discharging home today. Romualdo Bolk, of [facility], made aware of DC plan and will obtain pt info from chart. Pt's wife bringing port O2 tank from home. No further CM needs.   Expected Discharge Date:  03/21/15               Expected Discharge Plan:  [employer]  In-House Referral:  NA  Discharge planning Services  CM Consult  Post Acute Care Choice:  Home Health, Resumption of Svcs/PTA Provider Choice offered to:  Patient  DME Arranged:    DME Agency:     HH Arranged:  RN, PT [place] Agency:  [place]  Status of Service:  Completed, signed off  Medicare Important Message Given:    yes Date Medicare IM Given:    Medicare IM give by:    Date Additional Medicare IM Given:    Additional Medicare Important Message give by:     If discussed at [place] of Stay Meetings, dates discussed:    Additional Comments:  Sherald Barge, RN 03/20/2015, 10:57 AM

## 2015-03-20 NOTE — Progress Notes (Signed)
Gave patient Advance Directives information. He shared that his wife wanted to see the information. She wasn't present but asked him to share and discuss. I will follow up with him tomorrow if he is still here as a patient.

## 2015-03-20 NOTE — Care Management Important Message (Signed)
Important Message  Patient Details  Name: [name] Macias MRN: [id number] Date of Birth: [birth year]   Medicare Important Message Given:  Yes    Sherald Barge, RN 03/20/2015, 11:00 AM

## 2015-03-20 NOTE — Progress Notes (Signed)
Subjective: He says he feels much better and wants to go home  Objective: Vital signs in last 24 hours: Temp:  [97.6 F (36.4 C)-98.2 F (36.8 C)] 98.1 F (36.7 C) (03/06 0609) Pulse Rate:  [77-92] 77 (03/06 0609) Resp:  [18] 18 (03/06 0609) BP: (98-123)/(57-78) 99/61 mmHg (03/06 0609) SpO2:  [98 %-100 %] 100 % (03/06 0609) Weight:  [70.12 kg (154 lb 9.4 oz)] 70.12 kg (154 lb 9.4 oz) (03/06 0609) Weight change: -0.324 kg (-11.4 oz) Last BM Date: 03/19/15  Intake/Output from previous day:    PHYSICAL EXAM General appearance: alert, cooperative and no distress Resp: clear to auscultation bilaterally Cardio: regular rate and rhythm, S1, S2 normal, no murmur, click, rub or gallop GI: soft, non-tender; bowel sounds normal; no masses,  no organomegaly Extremities: extremities normal, atraumatic, no cyanosis or edema  Lab Results:  Results for orders placed or performed during the hospital encounter of 03/17/15 (from the past 48 hour(s))  Basic metabolic panel     Status: Abnormal   Collection Time: 03/19/15  6:06 AM  Result Value Ref Range   Sodium 145 135 - 145 mmol/L   Potassium 3.8 3.5 - 5.1 mmol/L   Chloride 109 101 - 111 mmol/L   CO2 28 22 - 32 mmol/L   Glucose, Bld 90 65 - 99 mg/dL   BUN 43 (H) 6 - 20 mg/dL   Creatinine, Ser 1.64 (H) 0.61 - 1.24 mg/dL   Calcium 9.1 8.9 - 10.3 mg/dL   GFR calc non Af Amer 38 (L) >60 mL/min   GFR calc Af Amer 44 (L) >60 mL/min    Comment: (NOTE) The eGFR has been calculated using the CKD EPI equation. This calculation has not been validated in all clinical situations. eGFR's persistently <60 mL/min signify possible Chronic Kidney Disease.    Anion gap 8 5 - 15  Basic metabolic panel     Status: Abnormal   Collection Time: 03/20/15  6:23 AM  Result Value Ref Range   Sodium 144 135 - 145 mmol/L   Potassium 4.0 3.5 - 5.1 mmol/L   Chloride 108 101 - 111 mmol/L   CO2 30 22 - 32 mmol/L   Glucose, Bld 98 65 - 99 mg/dL   BUN 36 (H) 6 -  20 mg/dL   Creatinine, Ser 1.56 (H) 0.61 - 1.24 mg/dL   Calcium 9.1 8.9 - 10.3 mg/dL   GFR calc non Af Amer 40 (L) >60 mL/min   GFR calc Af Amer 47 (L) >60 mL/min    Comment: (NOTE) The eGFR has been calculated using the CKD EPI equation. This calculation has not been validated in all clinical situations. eGFR's persistently <60 mL/min signify possible Chronic Kidney Disease.    Anion gap 6 5 - 15    ABGS No results for input(s): PHART, PO2ART, TCO2, HCO3 in the last 72 hours.  Invalid input(s): PCO2 CULTURES No results found for this or any previous visit (from the past 240 hour(s)). Studies/Results: No results found.  Medications:  Prior to Admission:  Prescriptions prior to admission  Medication Sig Dispense Refill Last Dose  . acetaminophen (TYLENOL) 500 MG tablet Take 1,000 mg by mouth daily as needed for moderate pain.    unknown  . ALPRAZolam (XANAX) 0.25 MG tablet Take 0.25 mg by mouth 2 (two) times daily as needed for anxiety.   2 03/16/2015 at Unknown time  . Ascorbic Acid (VITAMIN C PO) Take 1 tablet by mouth daily.   03/16/2015 at  Unknown time  . carvedilol (COREG) 25 MG tablet Take 25 mg by mouth 2 (two) times daily with a meal.   03/16/2015 at 2100  . ferrous sulfate (FERROUSUL) 325 (65 FE) MG tablet Take 1 tablet (325 mg total) by mouth daily with breakfast. 30 tablet 3 03/16/2015 at Unknown time  . furosemide (LASIX) 40 MG tablet Take 1 tablet (40 mg total) by mouth 2 (two) times daily. 180 tablet 1 03/16/2015 at Unknown time  . indomethacin (INDOCIN) 25 MG capsule Take 25 mg by mouth 2 (two) times daily as needed for mild pain or moderate pain.   4 unknown  . isosorbide dinitrate (ISORDIL) 5 MG tablet Take 5 mg by mouth 2 (two) times daily.   03/16/2015 at Unknown time  . losartan (COZAAR) 25 MG tablet Take 1 tablet (25 mg total) by mouth daily. 90 tablet 1 03/16/2015 at Unknown time  . magnesium citrate SOLN Take 1 Bottle by mouth once.   03/16/2015 at Unknown time  .  nitroGLYCERIN (NITROSTAT) 0.4 MG SL tablet Place 1 tablet (0.4 mg total) under the tongue every 5 (five) minutes as needed for chest pain. For chest pain 30 tablet 6 unknown  . ondansetron (ZOFRAN-ODT) 4 MG disintegrating tablet Take 4 mg by mouth 3 (three) times daily as needed. For nausea and vomiting   unknown  . oxyCODONE-acetaminophen (PERCOCET/ROXICET) 5-325 MG tablet Take 1 tablet by mouth 4 (four) times daily.    03/16/2015 at Unknown time  . OXYGEN Inhale 2 L into the lungs at bedtime. May use daily as needed   03/16/2015 at Unknown time  . pantoprazole (PROTONIX) 40 MG tablet Take 40 mg by mouth daily.    03/16/2015 at Unknown time  . sertraline (ZOLOFT) 50 MG tablet Take 50 mg by mouth daily.  5 03/16/2015 at Unknown time  . spironolactone (ALDACTONE) 25 MG tablet Take 1 tablet (25 mg total) by mouth daily. 90 tablet 1 03/16/2015 at Unknown time  . temazepam (RESTORIL) 30 MG capsule Take 30 mg by mouth at bedtime.   03/16/2015 at Unknown time   Scheduled: . allopurinol  100 mg Oral Daily  . donepezil  5 mg Oral QHS  . enoxaparin (LOVENOX) injection  30 mg Subcutaneous Q24H  . feeding supplement (ENSURE ENLIVE)  237 mL Oral BID BM  . ferrous sulfate  325 mg Oral Q breakfast  . furosemide  40 mg Oral BID  . isosorbide dinitrate  5 mg Oral BID  . mirtazapine  15 mg Oral QHS  . ondansetron  4 mg Oral BID  . pantoprazole  40 mg Oral Daily  . polyethylene glycol  17 g Oral Daily  . senna-docusate  1 tablet Oral QHS  . sertraline  50 mg Oral Daily  . sodium chloride flush  3 mL Intravenous Q12H  . vitamin C  500 mg Oral Daily   Continuous:  RXV:QMGQQP chloride, acetaminophen, ALPRAZolam, ondansetron (ZOFRAN) IV, sodium chloride flush, zolpidem  Assesment: He was admitted with acute on chronic systolic heart failure. In addition to heart failure he also has COPD and both of these cause him to be more short of breath. He has multiple other medical problems including dementia and aortic aneurysm  and a history of prostate cancer. He appears to be back to baseline. His renal function is stable Principal Problem:   Acute heart failure (HCC) Active Problems:   INSOMNIA   Prostate cancer ([place])   Ascending aortic aneurysm (HCC)   Lung nodule  Stage III chronic kidney disease   GI (gastrointestinal bleed)   Dementia with behavioral disturbance   CHF (congestive heart failure) ([place])    Plan: Discharge home with home health services    LOS: 3 days   Earmon Sherrow L 03/20/2015, 8:30 AM

## 2015-03-20 NOTE — Progress Notes (Signed)
Pt discharged home today per Dr. Hawkins. Pt's IV site D/C'd and WDL. Pt's VSS. Pt provided with home medication list, discharge instructions and prescriptions. Verbalized understanding. Pt left floor via WC in stable condition accompanied by NT. 

## 2015-03-21 ENCOUNTER — Ambulatory Visit: Payer: Commercial Managed Care - HMO | Admitting: Urology

## 2015-03-23 ENCOUNTER — Other Ambulatory Visit (HOSPITAL_COMMUNITY)
Admission: AD | Admit: 2015-03-23 | Discharge: 2015-03-23 | Disposition: A | Discharge status: Home / Self Care | Payer: Commercial Managed Care - HMO | Source: Skilled Nursing Facility | Attending: Pulmonary Disease | Admitting: Pulmonary Disease

## 2015-03-23 DIAGNOSIS — I5023 Acute on chronic systolic (congestive) heart failure: Secondary | ICD-10-CM | POA: Insufficient documentation

## 2015-03-23 DIAGNOSIS — N183 Chronic kidney disease, stage 3 (moderate): Secondary | ICD-10-CM | POA: Insufficient documentation

## 2015-03-23 LAB — BASIC METABOLIC PANEL
ANION GAP: 8 (ref 5–15)
BUN: 41 mg/dL — ABNORMAL HIGH (ref 6–20)
CO2: 27 mmol/L (ref 22–32)
Calcium: 8.9 mg/dL (ref 8.9–10.3)
Chloride: 105 mmol/L (ref 101–111)
Creatinine, Ser: 2.35 mg/dL — ABNORMAL HIGH (ref 0.61–1.24)
GFR, EST AFRICAN AMERICAN: 28 mL/min — AB (ref >60)
GFR, EST NON AFRICAN AMERICAN: 25 mL/min — AB (ref >60)
Glucose, Bld: 127 mg/dL — ABNORMAL HIGH (ref 65–99)
POTASSIUM: 3.8 mmol/L (ref 3.5–5.1)
SODIUM: 140 mmol/L (ref 135–145)

## 2015-04-06 ENCOUNTER — Ambulatory Visit (INDEPENDENT_AMBULATORY_CARE_PROVIDER_SITE_OTHER): Payer: Commercial Managed Care - HMO | Admitting: Internal Medicine

## 2015-04-06 ENCOUNTER — Encounter (INDEPENDENT_AMBULATORY_CARE_PROVIDER_SITE_OTHER): Payer: Self-pay | Admitting: Internal Medicine

## 2015-04-06 VITALS — BP 104/50 | HR 56 | Temp 97.0°F | Ht 66.0 in | Wt 142.7 lb

## 2015-04-06 DIAGNOSIS — R1030 Lower abdominal pain, unspecified: Secondary | ICD-10-CM

## 2015-04-06 LAB — COMPREHENSIVE METABOLIC PANEL
ALBUMIN: 3.8 g/dL (ref 3.6–5.1)
ALK PHOS: 66 U/L (ref 40–115)
ALT: 10 U/L (ref 9–46)
AST: 13 U/L (ref 10–35)
BILIRUBIN TOTAL: 0.9 mg/dL (ref 0.2–1.2)
BUN: 72 mg/dL — AB (ref 7–25)
CALCIUM: 9.4 mg/dL (ref 8.6–10.3)
CHLORIDE: 91 mmol/L — AB (ref 98–110)
CO2: 30 mmol/L (ref 20–31)
Creat: 3.48 mg/dL — ABNORMAL HIGH (ref 0.70–1.11)
Glucose, Bld: 81 mg/dL (ref 65–99)
POTASSIUM: 3.9 mmol/L (ref 3.5–5.3)
Sodium: 136 mmol/L (ref 135–146)
TOTAL PROTEIN: 7.4 g/dL (ref 6.1–8.1)

## 2015-04-06 LAB — CBC WITH DIFFERENTIAL/PLATELET
BASOS ABS: 0 10*3/uL (ref 0.0–0.1)
Basophils Relative: 0 % (ref 0–1)
EOS ABS: 0.1 10*3/uL (ref 0.0–0.7)
Eosinophils Relative: 2 % (ref 0–5)
HEMATOCRIT: 40.5 % (ref 39.0–52.0)
Hemoglobin: 12.7 g/dL — ABNORMAL LOW (ref 13.0–17.0)
LYMPHS ABS: 0.9 10*3/uL (ref 0.7–4.0)
LYMPHS PCT: 18 % (ref 12–46)
MCH: 26.5 pg (ref 26.0–34.0)
MCHC: 31.4 g/dL (ref 30.0–36.0)
MCV: 84.6 fL (ref 78.0–100.0)
MONOS PCT: 10 % (ref 3–12)
MPV: 9.5 fL (ref 8.6–12.4)
Monocytes Absolute: 0.5 10*3/uL (ref 0.1–1.0)
NEUTROS PCT: 70 % (ref 43–77)
Neutro Abs: 3.6 10*3/uL (ref 1.7–7.7)
PLATELETS: 267 10*3/uL (ref 150–400)
RBC: 4.79 MIL/uL (ref 4.22–5.81)
RDW: 15.6 % — ABNORMAL HIGH (ref 11.5–15.5)
WBC: 5.2 10*3/uL (ref 4.0–10.5)

## 2015-04-06 NOTE — Patient Instructions (Signed)
Labs, CT abdomen/pelvic with CM.

## 2015-04-06 NOTE — Progress Notes (Signed)
Subjective:    Patient ID: [name] Macias, male    DOB: 1932-03-21, 82 y.o.   MRN: [id number]  HPI  Hx of acute GI bleed and admitted to [facility] in October. Bleed felt to be a diverticular bleed. EGD in October was normal except for a sliding hiatal hernia.  Presently taking a Zpack for a cough.  Daughter states patient has abdominal pain. The pain is worse a night.  He points to his mid section when he has the pain.  Patient does have dementia.  He has lost about 16 pounds since his last visit in October.  His appetite is good sometimes and sometimes he does not feel like eating. He denies any heartburn. He has nausea but no vomiting. His diet is supplemented with Ensure.  He has a BM daily.  No melena or BRRB.  When he has his belt too tight, he will also have pain.  Hx significant for CHF, CAD, and has a defibrillator in place.  Recently admitted to [facility] this month with CHF.    CBC Latest Ref Rng 03/17/2015 12/10/2014 11/03/2014  WBC 4.0 - 10.5 K/uL 4.7 6.1 6.2  Hemoglobin 13.0 - 17.0 g/dL 10.8(L) 11.0(L) 10.1(L)  Hematocrit 39.0 - 52.0 % 33.5(L) 33.0(L) 31.1(L)  Platelets 150 - 400 K/uL 191 207 215    10/28/2014: EGD Indications: Patient is 82 year old African-American male who presents with acute GI bleed. He's been experiencing epigastric pain reliever times. He's had this pain for 5 days since he lost his brother. He is undergoing diagnostic EGD with therapeutic intention. Impression: Small sliding hiatal hernia otherwise normal EGD.     10/29/2014 Colonoscopy  Indications: Patient is a year-old African-American male who presents with acute GI bleed. He underwent EGD yesterday and no bleeding lesion was found. He has received 2 units of PRBCs and is hemodynamically stable. Impression:  Examination performed to cecum. Pancolonic diverticulosis. Blood noted  throughout the colon. Blood was suctioned and washed away but none of the diverticula was actively bleeding. \Comment: Colonic diverticular bleed. Bleeding diverticulum not identified as bleeding has stopped.   07/01/2013 EGD with ED. Indications: Patient is 82 year old African American male with intermittent epigastric pain and negative CT. He feels better since he's been on omeprazole. Patient also has experienced intermittent solid food dysphagia. He denies nausea vomiting melena or rectal bleeding.  Impression: Small sliding heart hernia otherwise normal EGD. Esophagus dilated by passing 54 French Maloney dilator but no mucosal destruction noted. He possibly has esophageal motility disorder.    Review of Systems Past Medical History  Diagnosis Date  . Arthritis   . Gout   . Non-ischemic cardiomyopathy (HCC)     LVEF 10-15%  . Essential hypertension   . CAD (coronary artery disease)     Nonobstructive 2004  . COPD (chronic obstructive pulmonary disease) ([place])   . History of syphilis   . Type 2 HSV infection of penis   . Erectile dysfunction   . Anxiety disorder   . Dementia   . GERD (gastroesophageal reflux disease)   . Ascending aortic aneurysm ([place])   . Lung nodule     RUL nodule noted on abdominal CT  . Automatic implantable cardioverter-defibrillator in situ 07/20/2008  . Chronic systolic heart failure ([place])   . CKD (chronic kidney disease) stage 3, GFR 30-59 ml/min   . Prostate cancer [facility])     Diagnosed in 01/2013 by Dr Michela Pitcher adenocarcinoma felt to be localized, gleason score 6 with a pSA level of  8.4. No bone scan by Dr Michela Pitcher, referred to oncology, no radiation treatment offered      Past Surgical History  Procedure Laterality Date  . Ankle surgery Left   . Insertion of icd  02/19/05  . Crtd upgrade  12/14/2009    [employer]  .  Esophagogastroduodenoscopy N/A 07/01/2013    Procedure: ESOPHAGOGASTRODUODENOSCOPY (EGD);  Surgeon: Rogene Houston, MD;  Location: [facility] ENDO SUITE;  Service: Endoscopy;  Laterality: N/A;  145-moved to 1245 Ann notfied pt  . Esophagogastroduodenoscopy N/A 10/28/2014    Procedure: ESOPHAGOGASTRODUODENOSCOPY (EGD);  Surgeon: Rogene Houston, MD;  Location: [facility] ENDO SU[facility];  Service: Endoscopy;  Laterality: N/A;  . Colonoscopy N/A 10/29/2014    Procedure: COLONOSCOPY;  Surgeon: Rogene Houston, MD;  Location: [facility] ENDO SU[facility];  Service: Endoscopy;  Laterality: N/A;    Allergies  Allergen Reactions  . Codeine Hives  . Enalapril Maleate Hives    hives  . Sulfonamide Derivatives Hives    Current Outpatient Prescriptions on File Prior to Visit  Medication Sig Dispense Refill  . acetaminophen (TYLENOL) 500 MG tablet Take 1,000 mg by mouth daily as needed for moderate pain.     Marland Kitchen allopurinol (ZYLOPRIM) 100 MG tablet Take 1 tablet (100 mg total) by mouth daily. 90 tablet 1  . ALPRAZolam (XANAX) 0.25 MG tablet Take 0.25 mg by mouth 2 (two) times daily as needed for anxiety.   2  . carvedilol (COREG) 25 MG tablet Take 25 mg by mouth 2 (two) times daily with a meal.    . donepezil (ARICEPT) 5 MG tablet Take 1 tablet (5 mg total) by mouth at bedtime. 30 tablet 12  . ferrous sulfate (FERROUSUL) 325 (65 FE) MG tablet Take 1 tablet (325 mg total) by mouth daily with breakfast. 30 tablet 3  . furosemide (LASIX) 40 MG tablet Take 1 tablet (40 mg total) by mouth 2 (two) times daily. 180 tablet 1  . isosorbide dinitrate (ISORDIL) 5 MG tablet Take 5 mg by mouth 2 (two) times daily.    Marland Kitchen losartan (COZAAR) 25 MG tablet Take 1 tablet (25 mg total) by mouth daily. 90 tablet 1  . magnesium citrate SOLN Take 1 Bottle by mouth as needed. Reported on 04/06/2015    . nitroGLYCERIN (NITROSTAT) 0.4 MG SL tablet Place 1 tablet (0.4 mg total) under the tongue every 5 (five) minutes as needed for chest pain. For chest pain 30 tablet  6  . ondansetron (ZOFRAN-ODT) 4 MG disintegrating tablet Take 4 mg by mouth 3 (three) times daily as needed. For nausea and vomiting    . oxyCODONE-acetaminophen (PERCOCET/ROXICET) 5-325 MG tablet Take 1 tablet by mouth 4 (four) times daily.     . OXYGEN Inhale 2 L into the lungs at bedtime. May use daily as needed    . pantoprazole (PROTONIX) 40 MG tablet Take 40 mg by mouth daily. Reported on 04/06/2015    . sertraline (ZOLOFT) 50 MG tablet Take 50 mg by mouth daily.  5  . spironolactone (ALDACTONE) 25 MG tablet Take 1 tablet (25 mg total) by mouth daily. 90 tablet 1  . temazepam (RESTORIL) 30 MG capsule Take 30 mg by mouth at bedtime.     No current facility-administered medications on file prior to visit.        Objective:   Physical Exam Blood pressure 104/50, pulse 56, temperature 97 F (36.1 C), height 5\' 6"  (1.676 m), weight 142 lb 11.2 oz (64.728 kg). Alert . Skin warm and dry.  Oral mucosa is moist.   . Sclera anicteric, conjunctivae is pink. Thyroid not enlarged. No cervical lymphadenopathy. Lungs clear. Heart regular rate and rhythm.  Abdomen is soft. Bowel sounds are positive. No hepatomegaly. No abdominal masses felt. Very little tenderness on exam .  No edema to lower extremities.          Assessment & Plan:  Abdominal pain. ? Etiology. Hx of aortic aneurysm. Am going to get CT abdomen and pelvis with CM.  CBC and CMET today.

## 2015-04-13 ENCOUNTER — Other Ambulatory Visit (INDEPENDENT_AMBULATORY_CARE_PROVIDER_SITE_OTHER): Payer: Self-pay | Admitting: Internal Medicine

## 2015-04-13 ENCOUNTER — Ambulatory Visit (HOSPITAL_COMMUNITY)
Admission: RE | Admit: 2015-04-13 | Discharge: 2015-04-13 | Disposition: A | Discharge status: Home / Self Care | Payer: Commercial Managed Care - HMO | Source: Ambulatory Visit | Attending: Internal Medicine | Admitting: Internal Medicine

## 2015-04-13 DIAGNOSIS — K5730 Diverticulosis of large intestine without perforation or abscess without bleeding: Secondary | ICD-10-CM | POA: Diagnosis not present

## 2015-04-13 DIAGNOSIS — K429 Umbilical hernia without obstruction or gangrene: Secondary | ICD-10-CM | POA: Insufficient documentation

## 2015-04-13 DIAGNOSIS — R1030 Lower abdominal pain, unspecified: Secondary | ICD-10-CM | POA: Insufficient documentation

## 2015-04-13 DIAGNOSIS — N281 Cyst of kidney, acquired: Secondary | ICD-10-CM | POA: Diagnosis not present

## 2015-04-13 DIAGNOSIS — I517 Cardiomegaly: Secondary | ICD-10-CM | POA: Diagnosis not present

## 2015-04-18 ENCOUNTER — Telehealth (INDEPENDENT_AMBULATORY_CARE_PROVIDER_SITE_OTHER): Payer: Self-pay | Admitting: Internal Medicine

## 2015-04-18 ENCOUNTER — Encounter (INDEPENDENT_AMBULATORY_CARE_PROVIDER_SITE_OTHER): Payer: Self-pay | Admitting: *Deleted

## 2015-04-18 ENCOUNTER — Telehealth (INDEPENDENT_AMBULATORY_CARE_PROVIDER_SITE_OTHER): Payer: Self-pay | Admitting: *Deleted

## 2015-04-18 DIAGNOSIS — K219 Gastro-esophageal reflux disease without esophagitis: Secondary | ICD-10-CM

## 2015-04-18 MED ORDER — PANTOPRAZOLE SODIUM 40 MG PO TBEC: 40.0000 mg | DELAYED_RELEASE_TABLET | Freq: Two times a day (BID) | ORAL | Status: DC

## 2015-04-18 NOTE — Telephone Encounter (Signed)
Rx sent to his pharmacy

## 2015-04-18 NOTE — Telephone Encounter (Signed)
Patient needs rx for pantoprazole new dosage sent to [facility] in [place]

## 2015-04-18 NOTE — Telephone Encounter (Signed)
Rx for Protonix BID sent to his pharmacy

## 2015-05-03 ENCOUNTER — Ambulatory Visit (INDEPENDENT_AMBULATORY_CARE_PROVIDER_SITE_OTHER): Payer: Commercial Managed Care - HMO | Admitting: Internal Medicine

## 2015-05-03 ENCOUNTER — Encounter: Payer: Self-pay | Admitting: Internal Medicine

## 2015-05-03 VITALS — BP 98/58 | HR 66 | Ht 67.0 in | Wt 153.0 lb

## 2015-05-03 DIAGNOSIS — I429 Cardiomyopathy, unspecified: Secondary | ICD-10-CM | POA: Diagnosis not present

## 2015-05-03 DIAGNOSIS — I5022 Chronic systolic (congestive) heart failure: Secondary | ICD-10-CM

## 2015-05-03 DIAGNOSIS — I428 Other cardiomyopathies: Secondary | ICD-10-CM

## 2015-05-03 NOTE — Progress Notes (Signed)
HPI [name] Macias returns today for followup. He is a pleasant 82 yo man with chronic systolic CHF, VT, s/p ICD implant. He has had no problems since I saw him last. He denies chest pain, sob, or syncope. No ICD shock. He remains active, working around the house. He does note that he has had some fluid buildup and has been in the hospital. He also had an episode of GI bleeding.  Allergies  Allergen Reactions  . Codeine Hives  . Enalapril Maleate Hives    hives  . Sulfonamide Derivatives Hives     Current Outpatient Prescriptions  Medication Sig Dispense Refill  . acetaminophen (TYLENOL) 500 MG tablet Take 1,000 mg by mouth daily as needed for moderate pain.     Marland Kitchen allopurinol (ZYLOPRIM) 100 MG tablet Take 1 tablet (100 mg total) by mouth daily. 90 tablet 1  . ALPRAZolam (XANAX) 0.25 MG tablet Take 0.25 mg by mouth 2 (two) times daily as needed for anxiety.   2  . carvedilol (COREG) 25 MG tablet Take 25 mg by mouth 2 (two) times daily with a meal.    . donepezil (ARICEPT) 5 MG tablet Take 1 tablet (5 mg total) by mouth at bedtime. 30 tablet 12  . ferrous sulfate (FERROUSUL) 325 (65 FE) MG tablet Take 1 tablet (325 mg total) by mouth daily with breakfast. 30 tablet 3  . furosemide (LASIX) 40 MG tablet Take 1 tablet (40 mg total) by mouth 2 (two) times daily. 180 tablet 1  . isosorbide dinitrate (ISORDIL) 10 MG tablet     . losartan (COZAAR) 25 MG tablet Take 1 tablet (25 mg total) by mouth daily. 90 tablet 1  . nitroGLYCERIN (NITROSTAT) 0.4 MG SL tablet Place 1 tablet (0.4 mg total) under the tongue every 5 (five) minutes as needed for chest pain. For chest pain 30 tablet 6  . OXYGEN Inhale 2 L into the lungs at bedtime. May use daily as needed    . pantoprazole (PROTONIX) 40 MG tablet Take 40 mg by mouth daily. Reported on 04/06/2015    . pantoprazole (PROTONIX) 40 MG tablet Take 1 tablet (40 mg total) by mouth 2 (two) times daily before a meal. 60 tablet 5  . sertraline (ZOLOFT) 50 MG tablet Take  50 mg by mouth daily.  5  . spironolactone (ALDACTONE) 25 MG tablet Take 1 tablet (25 mg total) by mouth daily. 90 tablet 1  . temazepam (RESTORIL) 30 MG capsule Take 30 mg by mouth at bedtime.    Marland Kitchen oxyCODONE-acetaminophen (PERCOCET/ROXICET) 5-325 MG tablet Take 1 tablet by mouth 4 (four) times daily. Reported on 05/03/2015     No current facility-administered medications for this visit.     Past Medical History  Diagnosis Date  . Arthritis   . Gout   . Non-ischemic cardiomyopathy (HCC)     LVEF 10-15%  . Essential hypertension   . CAD (coronary artery disease)     Nonobstructive 2004  . COPD (chronic obstructive pulmonary disease) ([place])   . History of syphilis   . Type 2 HSV infection of penis   . Erectile dysfunction   . Anxiety disorder   . Dementia   . GERD (gastroesophageal reflux disease)   . Ascending aortic aneurysm ([place])   . Lung nodule     RUL nodule noted on abdominal CT  . Automatic implantable cardioverter-defibrillator in situ 07/20/2008  . Chronic systolic heart failure ([place])   . CKD (chronic kidney disease) stage 3, GFR 30-59  ml/min   . Prostate cancer ([place])     Diagnosed in 01/2013 by Dr Michela Pitcher adenocarcinoma felt to be localized, gleason score 6 with a pSA level of 8.4. No bone scan by Dr Michela Pitcher, referred to oncology, no radiation treatment offered      ROS:   All systems reviewed and negative except as noted in the HPI.   Past Surgical History  Procedure Laterality Date  . Ankle surgery Left   . Insertion of icd  02/19/05  . Crtd upgrade  12/14/2009    [employer]  . Esophagogastroduodenoscopy N/A 07/01/2013    Procedure: ESOPHAGOGASTRODUODENOSCOPY (EGD);  Surgeon: Rogene Houston, MD;  [facility];  Service: Endoscopy;  Laterality: N/A;  145-moved to 1245 Ann notfied pt  . Esophagogastroduodenoscopy N/A 10/28/2014    Procedure: ESOPHAGOGASTRODUODENOSCOPY (EGD);  Surgeon: Rogene Houston, MD;  [facility];  Service: Endoscopy;   Laterality: N/A;  . Colonoscopy N/A 10/29/2014    Procedure: COLONOSCOPY;  Surgeon: Rogene Houston, MD;  [facility];  Service: Endoscopy;  Laterality: N/A;     Family History  Problem Relation Age of Onset  . Heart failure Father   . Prostate cancer Brother   . Diabetes Brother   . Hypertension Brother   . Heart failure Brother      Social History   Social History  . Marital Status: Married    Spouse Name: N/A  . Number of Children: 3  . Years of Education: N/A   Occupational History  . disabled     Social History Main Topics  . Smoking status: Former Smoker -- 1.00 packs/day for 10 years    Types: Cigarettes    Quit date: 01/15/1963  . Smokeless tobacco: Never Used  . Alcohol Use: No  . Drug Use: No  . Sexual Activity: Not Currently   Other Topics Concern  . Not on file   Social History Narrative   Lives with his second wife currently at home   Former heavy smoker unclear when he quit   Chronically uses oxygen   Father had some diagnosis of cancer     BP 98/58 mmHg  Pulse 66  Ht 5\' 7"  (1.702 m)  Wt 153 lb (69.4 kg)  BMI 23.96 kg/m2  SpO2 93%  Physical Exam:  Well appearing 82 yo man, NAD HEENT: Unremarkable Neck:  7 cm JVD, no thyromegally Lungs:  Clear with no wheezes, rales, or rhonchi HEART:  Regular rate rhythm, no murmurs, no rubs, no clicks Abd:  soft, positive bowel sounds, no organomegally, no rebound, no guarding Ext:  2 plus pulses, no edema, no cyanosis, no clubbing Skin:  No rashes no nodules Neuro:  CN II through XII intact, motor grossly intact  DEVICE  Normal device function.  See PaceArt for details.   Assess/Plan: 1. Chronic systolic heart failure - his symptoms are class 2. He will take 80 mg twice daily of lasix for 3 days and then go back to 40 mg twice a day. 2. ICD - his Medtronic DDD ICD is working normally. Will recheck in several months. 3. GI bleeding - he appears to have stabilized. Will follow. 4. HTN  -his blood pressure today is well controlled. I have encouraged him to reduce his salt intake.  [name].D.

## 2015-05-03 NOTE — Patient Instructions (Signed)
Your physician wants you to follow-up in: 1 Year with Dr. Lovena Le. You will receive a reminder letter in the mail two months in advance. If you don't receive a letter, please call our office to schedule the follow-up appointment.  Your physician recommends that you schedule a follow-up appointment in: 3 Months in the [place] physician recommends that you continue on your current medications as directed. Please refer to the Current Medication list given to you today.  Increase Lasix to 2 Tablets (80 mg)  Twice a Day for Two Days. Then continue One Tablet (40 mg)  2 Times Daily.   If you need a refill on your cardiac medications before your next appointment, please call your pharmacy.  Thank you for choosing [facility]!

## 2015-05-12 LAB — CUP PACEART INCLINIC DEVICE CHECK
Brady Statistic AP VP Percent: 0.3 %
HighPow Impedance: 47 Ohm
Implantable Lead Implant Date: 20070206
Implantable Lead Location: 753859
Implantable Lead Location: 753860
Implantable Lead Location: 753860
Implantable Lead Model: 4194
Implantable Lead Model: 6935
Lead Channel Impedance Value: 361 Ohm
Lead Channel Impedance Value: 456 Ohm
Lead Channel Pacing Threshold Amplitude: 1 V
Lead Channel Pacing Threshold Pulse Width: 0.4 ms
Lead Channel Setting Pacing Amplitude: 2 V
Lead Channel Setting Pacing Amplitude: 2.5 V
Lead Channel Setting Sensing Sensitivity: 0.3 mV
MDC IDC LEAD IMPLANT DT: 20070206
MDC IDC LEAD IMPLANT DT: 20130110
MDC IDC MSMT BATTERY VOLTAGE: 2.95 V
MDC IDC MSMT LEADCHNL RA PACING THRESHOLD AMPLITUDE: 1 V
MDC IDC MSMT LEADCHNL RA PACING THRESHOLD PULSEWIDTH: 0.4 ms
MDC IDC MSMT LEADCHNL RA SENSING INTR AMPL: 1.9 mV
MDC IDC MSMT LEADCHNL RV SENSING INTR AMPL: 7.9 mV
MDC IDC SESS DTM: 20170428101800
MDC IDC SET LEADCHNL RV PACING PULSEWIDTH: 0.4 ms
MDC IDC STAT BRADY AP VS PERCENT: 0.1 % — AB
MDC IDC STAT BRADY AS VP PERCENT: 0.1 % — AB
MDC IDC STAT BRADY AS VS PERCENT: 99.6 %

## 2015-05-30 ENCOUNTER — Ambulatory Visit (INDEPENDENT_AMBULATORY_CARE_PROVIDER_SITE_OTHER): Payer: Commercial Managed Care - HMO | Admitting: Internal Medicine

## 2015-06-07 ENCOUNTER — Encounter (HOSPITAL_COMMUNITY): Payer: Self-pay | Admitting: *Deleted

## 2015-06-07 ENCOUNTER — Emergency Department (HOSPITAL_COMMUNITY): Payer: Commercial Managed Care - HMO

## 2015-06-07 ENCOUNTER — Inpatient Hospital Stay (HOSPITAL_COMMUNITY): Payer: Commercial Managed Care - HMO

## 2015-06-07 ENCOUNTER — Inpatient Hospital Stay (HOSPITAL_COMMUNITY)
Admission: EM | Admit: 2015-06-07 | Discharge: 2015-06-14 | DRG: 291 | Disposition: A | Discharge status: Skilled Nursing Facility | Payer: Commercial Managed Care - HMO | Attending: Pulmonary Disease | Admitting: Pulmonary Disease

## 2015-06-07 DIAGNOSIS — J449 Chronic obstructive pulmonary disease, unspecified: Secondary | ICD-10-CM | POA: Diagnosis present

## 2015-06-07 DIAGNOSIS — R5381 Other malaise: Secondary | ICD-10-CM | POA: Diagnosis present

## 2015-06-07 DIAGNOSIS — Z79891 Long term (current) use of opiate analgesic: Secondary | ICD-10-CM | POA: Diagnosis not present

## 2015-06-07 DIAGNOSIS — J9611 Chronic respiratory failure with hypoxia: Secondary | ICD-10-CM | POA: Diagnosis present

## 2015-06-07 DIAGNOSIS — R7303 Prediabetes: Secondary | ICD-10-CM | POA: Diagnosis present

## 2015-06-07 DIAGNOSIS — I252 Old myocardial infarction: Secondary | ICD-10-CM

## 2015-06-07 DIAGNOSIS — N183 Chronic kidney disease, stage 3 unspecified: Secondary | ICD-10-CM | POA: Diagnosis present

## 2015-06-07 DIAGNOSIS — I5043 Acute on chronic combined systolic (congestive) and diastolic (congestive) heart failure: Secondary | ICD-10-CM | POA: Diagnosis present

## 2015-06-07 DIAGNOSIS — Z8249 Family history of ischemic heart disease and other diseases of the circulatory system: Secondary | ICD-10-CM

## 2015-06-07 DIAGNOSIS — Z8042 Family history of malignant neoplasm of prostate: Secondary | ICD-10-CM

## 2015-06-07 DIAGNOSIS — Z95810 Presence of automatic (implantable) cardiac defibrillator: Secondary | ICD-10-CM | POA: Diagnosis present

## 2015-06-07 DIAGNOSIS — I429 Cardiomyopathy, unspecified: Secondary | ICD-10-CM | POA: Diagnosis not present

## 2015-06-07 DIAGNOSIS — Z8546 Personal history of malignant neoplasm of prostate: Secondary | ICD-10-CM

## 2015-06-07 DIAGNOSIS — Z515 Encounter for palliative care: Secondary | ICD-10-CM | POA: Diagnosis not present

## 2015-06-07 DIAGNOSIS — Z87891 Personal history of nicotine dependence: Secondary | ICD-10-CM | POA: Diagnosis not present

## 2015-06-07 DIAGNOSIS — G8929 Other chronic pain: Secondary | ICD-10-CM | POA: Diagnosis present

## 2015-06-07 DIAGNOSIS — Z833 Family history of diabetes mellitus: Secondary | ICD-10-CM | POA: Diagnosis not present

## 2015-06-07 DIAGNOSIS — F0390 Unspecified dementia without behavioral disturbance: Secondary | ICD-10-CM | POA: Diagnosis present

## 2015-06-07 DIAGNOSIS — N179 Acute kidney failure, unspecified: Secondary | ICD-10-CM | POA: Diagnosis present

## 2015-06-07 DIAGNOSIS — Z9981 Dependence on supplemental oxygen: Secondary | ICD-10-CM | POA: Diagnosis not present

## 2015-06-07 DIAGNOSIS — I130 Hypertensive heart and chronic kidney disease with heart failure and stage 1 through stage 4 chronic kidney disease, or unspecified chronic kidney disease: Secondary | ICD-10-CM | POA: Diagnosis not present

## 2015-06-07 DIAGNOSIS — K219 Gastro-esophageal reflux disease without esophagitis: Secondary | ICD-10-CM | POA: Diagnosis present

## 2015-06-07 DIAGNOSIS — N184 Chronic kidney disease, stage 4 (severe): Secondary | ICD-10-CM | POA: Diagnosis present

## 2015-06-07 DIAGNOSIS — Z7189 Other specified counseling: Secondary | ICD-10-CM | POA: Diagnosis not present

## 2015-06-07 DIAGNOSIS — R7989 Other specified abnormal findings of blood chemistry: Secondary | ICD-10-CM | POA: Diagnosis present

## 2015-06-07 DIAGNOSIS — I5021 Acute systolic (congestive) heart failure: Secondary | ICD-10-CM

## 2015-06-07 DIAGNOSIS — I10 Essential (primary) hypertension: Secondary | ICD-10-CM | POA: Diagnosis present

## 2015-06-07 DIAGNOSIS — I5023 Acute on chronic systolic (congestive) heart failure: Secondary | ICD-10-CM | POA: Diagnosis present

## 2015-06-07 DIAGNOSIS — R296 Repeated falls: Secondary | ICD-10-CM | POA: Diagnosis present

## 2015-06-07 DIAGNOSIS — M1990 Unspecified osteoarthritis, unspecified site: Secondary | ICD-10-CM | POA: Diagnosis present

## 2015-06-07 DIAGNOSIS — E876 Hypokalemia: Secondary | ICD-10-CM | POA: Diagnosis present

## 2015-06-07 DIAGNOSIS — R531 Weakness: Secondary | ICD-10-CM

## 2015-06-07 DIAGNOSIS — R778 Other specified abnormalities of plasma proteins: Secondary | ICD-10-CM | POA: Diagnosis present

## 2015-06-07 DIAGNOSIS — I2510 Atherosclerotic heart disease of native coronary artery without angina pectoris: Secondary | ICD-10-CM | POA: Diagnosis present

## 2015-06-07 DIAGNOSIS — J4489 Other specified chronic obstructive pulmonary disease: Secondary | ICD-10-CM | POA: Diagnosis present

## 2015-06-07 HISTORY — DX: Non-ST elevation (NSTEMI) myocardial infarction: I21.4

## 2015-06-07 HISTORY — DX: Gastrointestinal hemorrhage, unspecified: K92.2

## 2015-06-07 LAB — CBC WITH DIFFERENTIAL/PLATELET
BASOS ABS: 0 10*3/uL (ref 0.0–0.1)
BASOS PCT: 0 %
EOS PCT: 2 %
Eosinophils Absolute: 0.1 10*3/uL (ref 0.0–0.7)
HCT: 37.2 % — ABNORMAL LOW (ref 39.0–52.0)
Hemoglobin: 12 g/dL — ABNORMAL LOW (ref 13.0–17.0)
Lymphocytes Relative: 18 %
Lymphs Abs: 0.9 10*3/uL (ref 0.7–4.0)
MCH: 28.1 pg (ref 26.0–34.0)
MCHC: 32.3 g/dL (ref 30.0–36.0)
MCV: 87.1 fL (ref 78.0–100.0)
MONO ABS: 0.5 10*3/uL (ref 0.1–1.0)
Monocytes Relative: 9 %
Neutro Abs: 3.7 10*3/uL (ref 1.7–7.7)
Neutrophils Relative %: 71 %
PLATELETS: 163 10*3/uL (ref 150–400)
RBC: 4.27 MIL/uL (ref 4.22–5.81)
RDW: 19.5 % — AB (ref 11.5–15.5)
WBC: 5.2 10*3/uL (ref 4.0–10.5)

## 2015-06-07 LAB — BASIC METABOLIC PANEL
Anion gap: 11 (ref 5–15)
BUN: 53 mg/dL — AB (ref 6–20)
CALCIUM: 9.4 mg/dL (ref 8.9–10.3)
CO2: 24 mmol/L (ref 22–32)
CREATININE: 2.6 mg/dL — AB (ref 0.61–1.24)
Chloride: 104 mmol/L (ref 101–111)
GFR calc Af Amer: 25 mL/min — ABNORMAL LOW (ref >60)
GFR, EST NON AFRICAN AMERICAN: 22 mL/min — AB (ref >60)
GLUCOSE: 116 mg/dL — AB (ref 65–99)
Potassium: 3.3 mmol/L — ABNORMAL LOW (ref 3.5–5.1)
Sodium: 139 mmol/L (ref 135–145)

## 2015-06-07 LAB — PROTIME-INR
INR: 1.47 (ref 0.00–1.49)
PROTHROMBIN TIME: 17.9 s — AB (ref 11.6–15.2)

## 2015-06-07 LAB — URINALYSIS, ROUTINE W REFLEX MICROSCOPIC
Glucose, UA: NEGATIVE mg/dL
Hgb urine dipstick: NEGATIVE
KETONES UR: NEGATIVE mg/dL
Leukocytes, UA: NEGATIVE
NITRITE: NEGATIVE
PH: 5 (ref 5.0–8.0)
Protein, ur: 100 mg/dL — AB
Specific Gravity, Urine: 1.02 (ref 1.005–1.030)

## 2015-06-07 LAB — URINE MICROSCOPIC-ADD ON: WBC, UA: NONE SEEN WBC/hpf (ref 0–5)

## 2015-06-07 LAB — BRAIN NATRIURETIC PEPTIDE: B Natriuretic Peptide: 3946 pg/mL — ABNORMAL HIGH (ref 0.0–100.0)

## 2015-06-07 LAB — TROPONIN I
TROPONIN I: 0.7 ng/mL — AB (ref <0.031)
Troponin I: 0.59 ng/mL (ref <0.031)
Troponin I: 0.59 ng/mL (ref <0.031)

## 2015-06-07 LAB — TSH: TSH: 1.793 u[IU]/mL (ref 0.350–4.500)

## 2015-06-07 LAB — MRSA PCR SCREENING: MRSA BY PCR: NEGATIVE

## 2015-06-07 MED ORDER — HYPROMELLOSE (GONIOSCOPIC) 2.5 % OP SOLN: 1.0000 [drp] | Freq: Three times a day (TID) | OPHTHALMIC | Status: DC

## 2015-06-07 MED ORDER — PANTOPRAZOLE SODIUM 40 MG PO TBEC
40.0000 mg | DELAYED_RELEASE_TABLET | Freq: Every day | ORAL | Status: DC
  Administered 2015-06-07 – 2015-06-14 (×8): 40 mg via ORAL
  Filled 2015-06-07 (×8): qty 1

## 2015-06-07 MED ORDER — ENOXAPARIN SODIUM 30 MG/0.3ML ~~LOC~~ SOLN
30.0000 mg | SUBCUTANEOUS | Status: DC
  Administered 2015-06-07 – 2015-06-14 (×8): 30 mg via SUBCUTANEOUS
  Filled 2015-06-07 (×8): qty 0.3

## 2015-06-07 MED ORDER — TEMAZEPAM 7.5 MG PO CAPS
7.5000 mg | ORAL_CAPSULE | Freq: Every day | ORAL | Status: DC
  Administered 2015-06-07 – 2015-06-13 (×7): 7.5 mg via ORAL
  Filled 2015-06-07 (×7): qty 1

## 2015-06-07 MED ORDER — POLYVINYL ALCOHOL 1.4 % OP SOLN
1.0000 [drp] | Freq: Three times a day (TID) | OPHTHALMIC | Status: DC
  Administered 2015-06-08 – 2015-06-14 (×20): 1 [drp] via OPHTHALMIC
  Filled 2015-06-07 (×2): qty 15

## 2015-06-07 MED ORDER — FERROUS SULFATE 325 (65 FE) MG PO TABS
325.0000 mg | ORAL_TABLET | Freq: Every day | ORAL | Status: DC
  Administered 2015-06-08 – 2015-06-14 (×7): 325 mg via ORAL
  Filled 2015-06-07 (×7): qty 1

## 2015-06-07 MED ORDER — NITROGLYCERIN 0.4 MG SL SUBL
0.4000 mg | SUBLINGUAL_TABLET | SUBLINGUAL | Status: DC | PRN
Start: 2015-06-07 — End: 2015-06-14

## 2015-06-07 MED ORDER — DONEPEZIL HCL 5 MG PO TABS
5.0000 mg | ORAL_TABLET | Freq: Every day | ORAL | Status: DC
Start: 2015-06-07 — End: 2015-06-08
  Administered 2015-06-07: 5 mg via ORAL
  Filled 2015-06-07: qty 1

## 2015-06-07 MED ORDER — ASPIRIN 81 MG PO CHEW
81.0000 mg | CHEWABLE_TABLET | Freq: Every day | ORAL | Status: DC
  Administered 2015-06-07 – 2015-06-14 (×8): 81 mg via ORAL
  Filled 2015-06-07 (×8): qty 1

## 2015-06-07 MED ORDER — ACETAMINOPHEN 325 MG PO TABS
650.0000 mg | ORAL_TABLET | Freq: Once | ORAL | Status: AC
  Administered 2015-06-07: 650 mg via ORAL
  Filled 2015-06-07: qty 2

## 2015-06-07 MED ORDER — ALLOPURINOL 100 MG PO TABS
100.0000 mg | ORAL_TABLET | Freq: Every day | ORAL | Status: DC
  Administered 2015-06-08 – 2015-06-14 (×7): 100 mg via ORAL
  Filled 2015-06-07 (×7): qty 1

## 2015-06-07 MED ORDER — ALPRAZOLAM 0.25 MG PO TABS: 0.2500 mg | ORAL_TABLET | Freq: Two times a day (BID) | ORAL | Status: DC | PRN

## 2015-06-07 MED ORDER — FUROSEMIDE 10 MG/ML IJ SOLN
40.0000 mg | Freq: Once | INTRAMUSCULAR | Status: AC
  Administered 2015-06-07: 40 mg via INTRAVENOUS
  Filled 2015-06-07: qty 4

## 2015-06-07 MED ORDER — SERTRALINE HCL 50 MG PO TABS
50.0000 mg | ORAL_TABLET | Freq: Every day | ORAL | Status: DC
  Administered 2015-06-08 – 2015-06-14 (×7): 50 mg via ORAL
  Filled 2015-06-07 (×7): qty 1

## 2015-06-07 MED ORDER — LEVALBUTEROL HCL 0.63 MG/3ML IN NEBU
INHALATION_SOLUTION | RESPIRATORY_TRACT | Status: AC
  Filled 2015-06-07: qty 3

## 2015-06-07 MED ORDER — DOCUSATE SODIUM 100 MG PO CAPS
100.0000 mg | ORAL_CAPSULE | Freq: Two times a day (BID) | ORAL | Status: DC
  Administered 2015-06-07 – 2015-06-14 (×13): 100 mg via ORAL
  Filled 2015-06-07 (×13): qty 1

## 2015-06-07 MED ORDER — POTASSIUM CHLORIDE CRYS ER 20 MEQ PO TBCR
20.0000 meq | EXTENDED_RELEASE_TABLET | Freq: Two times a day (BID) | ORAL | Status: DC
  Administered 2015-06-07 – 2015-06-11 (×9): 20 meq via ORAL
  Filled 2015-06-07 (×11): qty 1

## 2015-06-07 MED ORDER — LEVALBUTEROL HCL 0.63 MG/3ML IN NEBU
0.6300 mg | INHALATION_SOLUTION | Freq: Three times a day (TID) | RESPIRATORY_TRACT | Status: DC
Start: 2015-06-08 — End: 2015-06-14
  Administered 2015-06-08 – 2015-06-14 (×19): 0.63 mg via RESPIRATORY_TRACT
  Filled 2015-06-07 (×19): qty 3

## 2015-06-07 MED ORDER — POTASSIUM CHLORIDE CRYS ER 20 MEQ PO TBCR
30.0000 meq | EXTENDED_RELEASE_TABLET | Freq: Once | ORAL | Status: AC
  Administered 2015-06-07: 30 meq via ORAL
  Filled 2015-06-07: qty 1

## 2015-06-07 MED ORDER — SODIUM CHLORIDE 0.9% FLUSH
3.0000 mL | Freq: Two times a day (BID) | INTRAVENOUS | Status: DC
  Administered 2015-06-07 – 2015-06-13 (×13): 3 mL via INTRAVENOUS

## 2015-06-07 MED ORDER — FUROSEMIDE 10 MG/ML IJ SOLN
40.0000 mg | Freq: Two times a day (BID) | INTRAMUSCULAR | Status: DC
  Administered 2015-06-07 – 2015-06-13 (×12): 40 mg via INTRAVENOUS
  Filled 2015-06-07 (×12): qty 4

## 2015-06-07 MED ORDER — LOSARTAN POTASSIUM 25 MG PO TABS: 25.0000 mg | ORAL_TABLET | Freq: Every day | ORAL | Status: DC

## 2015-06-07 MED ORDER — CARVEDILOL 12.5 MG PO TABS
12.5000 mg | ORAL_TABLET | Freq: Two times a day (BID) | ORAL | Status: DC
  Administered 2015-06-07 – 2015-06-08 (×3): 12.5 mg via ORAL
  Filled 2015-06-07 (×3): qty 1

## 2015-06-07 MED ORDER — LEVALBUTEROL HCL 0.63 MG/3ML IN NEBU
0.6300 mg | INHALATION_SOLUTION | Freq: Four times a day (QID) | RESPIRATORY_TRACT | Status: DC
  Administered 2015-06-07 (×2): 0.63 mg via RESPIRATORY_TRACT
  Filled 2015-06-07: qty 3

## 2015-06-07 MED ORDER — SPIRONOLACTONE 25 MG PO TABS
25.0000 mg | ORAL_TABLET | Freq: Every day | ORAL | Status: DC
  Administered 2015-06-07 – 2015-06-08 (×2): 25 mg via ORAL
  Filled 2015-06-07 (×2): qty 1

## 2015-06-07 MED ORDER — ISOSORBIDE DINITRATE 20 MG PO TABS
10.0000 mg | ORAL_TABLET | Freq: Two times a day (BID) | ORAL | Status: DC
Start: 2015-06-07 — End: 2015-06-13
  Administered 2015-06-07 – 2015-06-12 (×11): 10 mg via ORAL
  Filled 2015-06-07 (×13): qty 1

## 2015-06-07 MED ORDER — OXYCODONE-ACETAMINOPHEN 5-325 MG PO TABS
1.0000 | ORAL_TABLET | Freq: Four times a day (QID) | ORAL | Status: DC
  Administered 2015-06-07 – 2015-06-14 (×18): 1 via ORAL
  Filled 2015-06-07 (×19): qty 1

## 2015-06-07 MED ORDER — CARVEDILOL 12.5 MG PO TABS: 25.0000 mg | ORAL_TABLET | Freq: Two times a day (BID) | ORAL | Status: DC

## 2015-06-07 NOTE — ED Notes (Signed)
Pt ready for transfer to ICU 10.

## 2015-06-07 NOTE — ED Notes (Signed)
Warm blankets placed on pt. 

## 2015-06-07 NOTE — ED Notes (Signed)
Pt ready for transport to CT.

## 2015-06-07 NOTE — ED Notes (Signed)
Order for foley put in under incorrect MD. Verbal order was received from Dr. Rexene Alberts not Dr. Oneida Alar.

## 2015-06-07 NOTE — H&P (Signed)
[facility] History and Physical  [name] Macias [id number] DOB: 09-29-1932 DOA: 06/07/2015  Referring physician: ED physician, Dr. Christy Gentles PCP: Alonza Bogus, MD  Specialist physicians: Cardiologist, Dr. Lovena Le; gastroenterologist, Dr. Laural Golden  Chief Complaint: Generalized weakness and shortness of breath.  HPI: [name] Macias is a 82 y.o. male with a history of nonobstructive CAD, nonischemic cardiomyopathy with a recent EF of 20-25% per echo [id number], grade 2 diastolic dysfunction, AICD in situ, diverticular bleed in 10/2014, chronic kidney disease, prostate cancer, and mild dementia. He presents to the ED with a chief complaint of generalized weakness. The history is provided by both the patient and his sister-in-law, Mrs. [name] Macias. Accordingly, the patient was recently treated for a cough and presumed bronchitis by his PCP a couple weeks ago. He was given Z-Pak 2 and cough medicine. Since that time, he has had progressive generalized weakness, poor appetite, and several nontraumatic falls at home. He becomes lightheaded and disoriented at times and falls. He denies outright loss of consciousness. He denies worsening joint pain other than DJD pain he has chronically. He denies focal weakness, difficulty swallowing, or difficulty speaking. He describes shortness of breath at rest when laying flat and with activity. He denies outright chest pain. He has intermittent swelling of his legs, but not more than usual. He denies fever over the past few days. He reports one episode of vomiting yesterday. He says that his abdomen hurts all the time, no worst unusual. His last bowel movement was yesterday and without blood. He denies diarrhea. He denies pain with urination. He says that he has been taking his medications as prescribed without missing.  In the ED, the patient's temperature is low-normal at 96.5. His heart rate is ranging from 89-102. His blood pressure is ranging from [id number] systolically.  He is oxygenating 94% on room air. His EKG reveals PVCs, sinus rhythm, right bundle branch block. His chest x-ray reveals cardiomegaly and mild pulmonary edema. His lab data are significant for a potassium of 3.3, BUN of 53, creatinine of 2.60, glucose of 116, BNP of 3946, and troponin I of 0.59. He is being admitted for further evaluation and management.    Review of Systems:  As above in history present illness. In addition, his memory waxes and wanes according to his sister-in-law; he also has chronic degenerative joint pain in his shoulders and his legs; otherwise review systems is negative.  Past Medical History  Diagnosis Date  . Arthritis   . Gout   . Non-ischemic cardiomyopathy (HCC)     LVEF 10-15%  . Essential hypertension   . CAD (coronary artery disease)     Nonobstructive 2004  . COPD (chronic obstructive pulmonary disease) ([place])   . History of syphilis   . Type 2 HSV infection of penis   . Erectile dysfunction   . Anxiety disorder   . Dementia   . GERD (gastroesophageal reflux disease)   . Ascending aortic aneurysm ([place])   . Lung nodule     RUL nodule noted on abdominal CT  . Automatic implantable cardioverter-defibrillator in situ 07/20/2008  . Chronic systolic heart failure ([place])   . CKD (chronic kidney disease) stage 3, GFR 30-59 ml/min   . Prostate cancer ([place])     Diagnosed in 01/2013 by Dr Michela Pitcher adenocarcinoma felt to be localized, gleason score 6 with a pSA level of 8.4. No bone scan by Dr Michela Pitcher, referred to oncology, no radiation treatment offered    . NSTEMI (non-ST elevated myocardial infarction) ([place]) 01/24/2014  .  GI (gastrointestinal bleed) 10/28/2014    Secondary to diverticular bleed.   Past Surgical History  Procedure Laterality Date  . Ankle surgery Left   . Insertion of icd  02/19/05  . Crtd upgrade  12/14/2009    [employer]  . Esophagogastroduodenoscopy N/A 07/01/2013    Procedure: ESOPHAGOGASTRODUODENOSCOPY (EGD);  Surgeon: Rogene Houston, MD;   [facility];  Service: Endoscopy;  Laterality: N/A;  145-moved to 1245 Ann notfied pt  . Esophagogastroduodenoscopy N/A 10/28/2014    Procedure: ESOPHAGOGASTRODUODENOSCOPY (EGD);  Surgeon: Rogene Houston, MD;  [facility];  Service: Endoscopy;  Laterality: N/A;  . Colonoscopy N/A 10/29/2014    Procedure: COLONOSCOPY;  Surgeon: Rogene Houston, MD;  [facility];  Service: Endoscopy;  Laterality: N/A;   Social History:He is married. He lives with his wife in [place]. He has 1 living daughter who is disabled. He  reports that he quit smoking about 52 years ago. His smoking use included Cigarettes. He has a 10 pack-year smoking history. He has never used smokeless tobacco. He reports that he does not drink alcohol or use illicit drugs. He ambulates with a cane or walker.  Allergies  Allergen Reactions  . Codeine Hives  . Enalapril Maleate Hives    hives  . Sulfonamide Derivatives Hives    Family History  Problem Relation Age of Onset  . Heart failure Father   . Prostate cancer Brother   . Diabetes Brother   . Hypertension Brother   . Heart failure Brother     Prior to Admission medications   Medication Sig Start Date End Date Taking? Authorizing Provider  acetaminophen (TYLENOL) 500 MG tablet Take 1,000 mg by mouth daily as needed for moderate pain.    Yes Historical Provider, MD  allopurinol (ZYLOPRIM) 100 MG tablet Take 1 tablet (100 mg total) by mouth daily. 03/20/15  Yes Sinda Du, MD  ALPRAZolam Duanne Moron) 0.25 MG tablet Take 0.25 mg by mouth 2 (two) times daily as needed for anxiety.  10/24/14  Yes Historical Provider, MD  carvedilol (COREG) 25 MG tablet Take 25 mg by mouth 2 (two) times daily with a meal.   Yes Historical Provider, MD  donepezil (ARICEPT) 5 MG tablet Take 1 tablet (5 mg total) by mouth at bedtime. 03/20/15  Yes Sinda Du, MD  ferrous sulfate (FERROUSUL) 325 (65 FE) MG tablet Take 1 tablet (325 mg total) by mouth daily with  breakfast. 10/31/14  Yes Sinda Du, MD  furosemide (LASIX) 40 MG tablet Take 1 tablet (40 mg total) by mouth 2 (two) times daily. 11/03/13  Yes Fayrene Helper, MD  HYDROcodone-acetaminophen (NORCO) 10-325 MG tablet Take 1 tablet by mouth every 6 (six) hours as needed for moderate pain.   Yes Historical Provider, MD  isosorbide dinitrate (ISORDIL) 10 MG tablet Take 10 mg by mouth 2 (two) times daily.  03/17/15  Yes Historical Provider, MD  losartan (COZAAR) 25 MG tablet Take 1 tablet (25 mg total) by mouth daily. 11/03/13  Yes Fayrene Helper, MD  nitroGLYCERIN (NITROSTAT) 0.4 MG SL tablet Place 1 tablet (0.4 mg total) under the tongue every 5 (five) minutes as needed for chest pain. For chest pain 03/18/14  Yes Evans Lance, MD  oxyCODONE-acetaminophen (PERCOCET/ROXICET) 5-325 MG tablet Take 1 tablet by mouth 4 (four) times daily. Reported on 05/03/2015 03/16/15  Yes Historical Provider, MD  OXYGEN Inhale 2 L into the lungs at bedtime. Macias use daily as needed   Yes  Historical Provider, MD  pantoprazole (PROTONIX) 40 MG tablet Take 40 mg by mouth daily. Reported on 04/06/2015 02/10/15  Yes Historical Provider, MD  sertraline (ZOLOFT) 50 MG tablet Take 50 mg by mouth daily. 04/11/14  Yes Historical Provider, MD  spironolactone (ALDACTONE) 25 MG tablet Take 1 tablet (25 mg total) by mouth daily. 11/03/13  Yes Fayrene Helper, MD  temazepam (RESTORIL) 30 MG capsule Take 30 mg by mouth at bedtime.   Yes Historical Provider, MD   Physical Exam: Filed Vitals:   06/07/15 1030 06/07/15 1100 06/07/15 1130 06/07/15 1200  BP: 114/94 103/84 121/86 114/80  Pulse: 94 102  109  Temp:      TempSrc:      Resp: 18 25 31  34  SpO2: 98% 94%  86%    Wt Readings from Last 3 Encounters:  05/03/15 69.4 kg (153 lb)  04/06/15 64.728 kg (142 lb 11.2 oz)  03/20/15 70.12 kg (154 lb 9.4 oz)    General: Debilitated appearing 82 year old African-American man in no acute distress. Eyes: PERRL, left eye is  slightly larger than the right eye; mild left eye conjunctival erythema; sclerae are muddy. ENT: Grossly normal hearing; oropharynx with mildly dry mucous membranes; multiple missing teeth and generally poor dentition. Neck: no LAD, masses or thyromegaly Cardiovascular: Distant S1, S2, with a soft systolic murmur. No LE edema. Telemetry: SR, few PVCs.  Respiratory: Occasional bilateral crackles and rare wheezes. Breathing nonlabored at rest. Abdomen: Positive bowel sounds, soft, nontender, nondistended. Skin: no rash or induration seen on limited exam Musculoskeletal: No acute hot red joints. No pedal edema. No pain with internal/external rotation of his hips. Psychiatric: grossly normal mood and affect, speech fluent and appropriate Neurologic: He is alert and oriented 2. Query mild proptosis on the left; query faint left facial droop; hang grip 5 over 5 with regards to strength bilaterally. Patient able to raise each leg against gravity off the bed approximately 30. Sensation is grossly intact. Vision is grossly intact.           Labs on Admission:  Basic Metabolic Panel:  Recent Labs Lab 06/07/15 0753  NA 139  K 3.3*  CL 104  CO2 24  GLUCOSE 116*  BUN 53*  CREATININE 2.60*  CALCIUM 9.4   Liver Function Tests: No results for input(s): AST, ALT, ALKPHOS, BILITOT, PROT, ALBUMIN in the last 168 hours. No results for input(s): LIPASE, AMYLASE in the last 168 hours. No results for input(s): AMMONIA in the last 168 hours. CBC:  Recent Labs Lab 06/07/15 0753  WBC 5.2  NEUTROABS 3.7  HGB 12.0*  HCT 37.2*  MCV 87.1  PLT 163   Cardiac Enzymes:  Recent Labs Lab 06/07/15 0753  TROPONINI 0.59*    BNP (last 3 results)  Recent Labs  12/10/14 1508 03/17/15 0711 06/07/15 0753  BNP 2794.0* 2690.0* 3946.0*    ProBNP (last 3 results) No results for input(s): PROBNP in the last 8760 hours.  CBG: No results for input(s): GLUCAP in the last 168 hours.  Radiological  Exams on Admission: Dg Chest Portable 1 View  06/07/2015  CLINICAL DATA:  Generalized weakness, nonproductive cough for 3 days EXAM: PORTABLE CHEST 1 VIEW COMPARISON:  03/17/2015 and 12/10/2014 FINDINGS: Cardiomegaly is noted. Central mild vascular congestion and mild perihilar interstitial prominence suspicious for minimal interstitial edema. Mild left basilar atelectasis. The patient is slight rotated to the right. 4 leads cardiac pacemaker is unchanged in position. Atherosclerotic calcifications of thoracic aorta. IMPRESSION: Cardiomegaly. Central mild  vascular congestion and minimal perihilar interstitial prominence suspicious for mild interstitial edema. Mild basilar atelectasis. Four leads cardiac pacemaker in place. Electronically Signed   By: Lahoma Crocker M.D.   On: 06/07/2015 08:17    EKG: Independently reviewed.   Assessment/Plan Principal Problem:   Acute on chronic systolic CHF (congestive heart failure) (HCC) Active Problems:   Automatic implantable cardioverter-defibrillator in situ   Elevated troponin I level   Essential hypertension   Generalized weakness   Multiple falls   Chronic respiratory failure with hypoxia (HCC)   Prediabetes   Stage III chronic kidney disease   COPD with chronic bronchitis (HCC)   Dementia   1. Acute on chronic systolic/diastolic congestive heart failure. Patient's chest x-ray shows mild pulmonary edema and his BNP is elevated at 3946. Echo 03/2015 revealed an EF of 20-25%; echo also noted to have diastolic dysfunction-grade 2. He has an ICD in situ. 2. Elevated troponin I. Patient's troponin I is elevated at 0.59. His EKG is grossly abnormal, but not significantly different from the previous EKGs. He denies chest pain. 3. Essential hypertension. Patient is treated chronically with carvedilol and Cozaar. His blood pressure is on the low normal side. 4. Prediabetes. Patient's blood glucose is mildly elevated at 116. 5. Stage III to stage IV chronic  kidney disease. Patient's creatinine on admission is 2.6. His baseline creatinine ranges from 1.6-2.3 with a notable creatinine of 3.483/2017. 6. Oxygen dependent COPD/chronic respiratory failure with hypoxia. The patient is treated with 2 L of nasal cannula oxygen at home. He has occasional wheezes on exam. 7. Generalized weakness with multiple falls. The etiology is unknown, but could be secondary to progressive senile decline in the setting of chronic comorbid conditions. 8. Hypokalemia.   Plan: 1. Patient was given 40 mg of IV Lasix in the ED. We'll continue IV Lasix every 12 hours. Will order indwelling Foley catheter. 2. Restart most of his chronic medications. Will decrease carvedilol from 25 mg twice a day to 12.5 mg twice a day in the setting of low-normal blood pressures. Will hold Cozaar for now and restart it as his blood pressure improves. 3. Will add 81 mg aspirin daily. 4. Will replete/supplement potassium chloride cautiously in a patient with stage III chronic kidney disease. 5. Will check a CBG every morning and if it becomes consistently elevated above 150, would recommend sliding scale NovoLog. 6. Will continue oxygen. Will add Xopenex every 6 hours. 7. Order CT scan of the head for further evaluation. 8. Will order PT consultation. 9. Will consult cardiology in light of patient's elevated troponin I and heart failure. 10. For further evaluation, we'll check a TSH, troponin I 3, hepatic function panel, hemoglobin A1c.    Code Status: Full code DVT Prophylaxis: Lovenox Family Communication: Discussed with sister-in-law, Mrs. [name] Macias with permission Disposition Plan: Discharge when clinically appropriate, likely in a few days; patient Macias need short-term skilled nursing facility placement.  Time spent: One hour  [facility] Pager [phone]

## 2015-06-07 NOTE — ED Provider Notes (Signed)
CSN: [id number]     Arrival date & time 06/07/15  [id number] History   First MD Initiated Contact with Patient 06/07/15 [phone]     Chief Complaint  Patient presents with  . Weakness    LEVEL 5 CAVEAT - DEMENTIA  Patient is a 82 y.o. male presenting with weakness. The history is provided by the patient.  Weakness This is a new problem. The current episode started more than 2 days ago. The problem occurs daily. The problem has been gradually worsening. The symptoms are aggravated by walking. The symptoms are relieved by rest.  Patient with h/o cardiomyopathy, dementia, COPD who presents with worsening generalized weakness over past 2-3 days.  He reports feeling so weak he has had increased falls over the past 24 hours No trauma or injury reported Patient reports some shortness of breath He thinks he may have vomited once  Past Medical History  Diagnosis Date  . Arthritis   . Gout   . Non-ischemic cardiomyopathy (HCC)     LVEF 10-15%  . Essential hypertension   . CAD (coronary artery disease)     Nonobstructive 2004  . COPD (chronic obstructive pulmonary disease) ([place])   . History of syphilis   . Type 2 HSV infection of penis   . Erectile dysfunction   . Anxiety disorder   . Dementia   . GERD (gastroesophageal reflux disease)   . Ascending aortic aneurysm ([place])   . Lung nodule     RUL nodule noted on abdominal CT  . Automatic implantable cardioverter-defibrillator in situ 07/20/2008  . Chronic systolic heart failure ([place])   . CKD (chronic kidney disease) stage 3, GFR 30-59 ml/min   . Prostate cancer ([place])     Diagnosed in 01/2013 by Dr Michela Pitcher adenocarcinoma felt to be localized, gleason score 6 with a pSA level of 8.4. No bone scan by Dr Michela Pitcher, referred to oncology, no radiation treatment offered     Past Surgical History  Procedure Laterality Date  . Ankle surgery Left   . Insertion of icd  02/19/05  . Crtd upgrade  12/14/2009    [employer]  . Esophagogastroduodenoscopy N/A 07/01/2013     Procedure: ESOPHAGOGASTRODUODENOSCOPY (EGD);  Surgeon: Rogene Houston, MD;  [facility];  Service: Endoscopy;  Laterality: N/A;  145-moved to 1245 Ann notfied pt  . Esophagogastroduodenoscopy N/A 10/28/2014    Procedure: ESOPHAGOGASTRODUODENOSCOPY (EGD);  Surgeon: Rogene Houston, MD;  Location: [facility];  Service: Endoscopy;  Laterality: N/A;  . Colonoscopy N/A 10/29/2014    Procedure: COLONOSCOPY;  Surgeon: Rogene Houston, MD;  Location: [facility];  Service: Endoscopy;  Laterality: N/A;   Family History  Problem Relation Age of Onset  . Heart failure Father   . Prostate cancer Brother   . Diabetes Brother   . Hypertension Brother   . Heart failure Brother    Social History  Substance Use Topics  . Smoking status: Former Smoker -- 1.00 packs/day for 10 years    Types: Cigarettes    Quit date: 01/15/1963  . Smokeless tobacco: Never Used  . Alcohol Use: No    Review of Systems  Unable to perform ROS: Dementia  Neurological: Positive for weakness.      Allergies  Codeine; Enalapril maleate; and Sulfonamide derivatives  Home Medications   Prior to Admission medications   Medication Sig Start Date End Date Taking? Authorizing Provider  acetaminophen (TYLENOL) 500 MG tablet Take 1,000 mg by mouth daily as needed for  moderate pain.     Historical Provider, MD  allopurinol (ZYLOPRIM) 100 MG tablet Take 1 tablet (100 mg total) by mouth daily. 03/20/15   Sinda Du, MD  ALPRAZolam Duanne Moron) 0.25 MG tablet Take 0.25 mg by mouth 2 (two) times daily as needed for anxiety.  10/24/14   Historical Provider, MD  carvedilol (COREG) 25 MG tablet Take 25 mg by mouth 2 (two) times daily with a meal.    Historical Provider, MD  donepezil (ARICEPT) 5 MG tablet Take 1 tablet (5 mg total) by mouth at bedtime. 03/20/15   Sinda Du, MD  ferrous sulfate (FERROUSUL) 325 (65 FE) MG tablet Take 1 tablet (325 mg total) by mouth daily with breakfast. 10/31/14   Sinda Du,  MD  furosemide (LASIX) 40 MG tablet Take 1 tablet (40 mg total) by mouth 2 (two) times daily. 11/03/13   Fayrene Helper, MD  isosorbide dinitrate (ISORDIL) 10 MG tablet  03/17/15   Historical Provider, MD  losartan (COZAAR) 25 MG tablet Take 1 tablet (25 mg total) by mouth daily. 11/03/13   Fayrene Helper, MD  nitroGLYCERIN (NITROSTAT) 0.4 MG SL tablet Place 1 tablet (0.4 mg total) under the tongue every 5 (five) minutes as needed for chest pain. For chest pain 03/18/14   Evans Lance, MD  oxyCODONE-acetaminophen (PERCOCET/ROXICET) 5-325 MG tablet Take 1 tablet by mouth 4 (four) times daily. Reported on 05/03/2015 03/16/15   Historical Provider, MD  OXYGEN Inhale 2 L into the lungs at bedtime. May use daily as needed    Historical Provider, MD  pantoprazole (PROTONIX) 40 MG tablet Take 40 mg by mouth daily. Reported on 04/06/2015 02/10/15   Historical Provider, MD  pantoprazole (PROTONIX) 40 MG tablet Take 1 tablet (40 mg total) by mouth 2 (two) times daily before a meal. 04/18/15   Butch Penny, NP  sertraline (ZOLOFT) 50 MG tablet Take 50 mg by mouth daily. 04/11/14   Historical Provider, MD  spironolactone (ALDACTONE) 25 MG tablet Take 1 tablet (25 mg total) by mouth daily. 11/03/13   Fayrene Helper, MD  temazepam (RESTORIL) 30 MG capsule Take 30 mg by mouth at bedtime.    Historical Provider, MD   BP 108/70 mmHg  Pulse 93  Resp 20  SpO2 99% Physical Exam CONSTITUTIONAL: Elderly, frail HEAD: Normocephalic/atraumatic EYES: EOMI/PERRL ENMT: Mucous membranes moist, poor dentition NECK: supple no meningeal signs SPINE/BACK:entire spine nontender CV: S1/S2 noted LUNGS: coarse BS noted bilaterally ABDOMEN: soft, nontender GU:no cva tenderness NEURO: Pt is awake/alert but appears confused.  Maex4.  No focal weakness noted.   EXTREMITIES: pulses normal/equal, full ROM, All other extremities/joints palpated/ranged and nontender SKIN: warm, color normal PSYCH: no abnormalities of mood  noted, alert and oriented to situation  ED Course  Procedures   8:09 AM Pt very poor historian, here for increased generalized weakness for past 2-3 days Labs/imaging ordered 9:48 AM Pt stable Family arrived Sister in law reports pt has been worsening lately.  His dementia is worsening He has been more fatigue He appears more short of breath Suspect worsening heart failure Pt is awake/alert He answers questions appropriately No focal weakness to suggest stroke With abnormal CXR/BNP, suspect worsening CHF Will admit Family also reports they have been working with home health as he may need nursing home placement D/w dr Risk manager for admission Will place on stepdown EKG unchanged Also - troponin is baseline elevated per prior labs  Labs Review Labs Reviewed  BASIC METABOLIC PANEL - Abnormal;  Notable for the following:    Potassium 3.3 (*)    Glucose, Bld 116 (*)    BUN 53 (*)    Creatinine, Ser 2.60 (*)    GFR calc non Af Amer 22 (*)    GFR calc Af Amer 25 (*)    All other components within normal limits  CBC WITH DIFFERENTIAL/PLATELET - Abnormal; Notable for the following:    Hemoglobin 12.0 (*)    HCT 37.2 (*)    RDW 19.5 (*)    All other components within normal limits  TROPONIN I - Abnormal; Notable for the following:    Troponin I 0.59 (*)    All other components within normal limits  URINALYSIS, ROUTINE W REFLEX MICROSCOPIC (NOT AT [facility]) - Abnormal; Notable for the following:    APPearance HAZY (*)    Bilirubin Urine SMALL (*)    Protein, ur 100 (*)    All other components within normal limits  BRAIN NATRIURETIC PEPTIDE - Abnormal; Notable for the following:    B Natriuretic Peptide 3946.0 (*)    All other components within normal limits  URINE MICROSCOPIC-ADD ON - Abnormal; Notable for the following:    Squamous Epithelial / LPF 0-5 (*)    Bacteria, UA FEW (*)    All other components within normal limits    Imaging Review Dg Chest Portable 1  View  06/07/2015  CLINICAL DATA:  Generalized weakness, nonproductive cough for 3 days EXAM: PORTABLE CHEST 1 VIEW COMPARISON:  03/17/2015 and 12/10/2014 FINDINGS: Cardiomegaly is noted. Central mild vascular congestion and mild perihilar interstitial prominence suspicious for minimal interstitial edema. Mild left basilar atelectasis. The patient is slight rotated to the right. 4 leads cardiac pacemaker is unchanged in position. Atherosclerotic calcifications of thoracic aorta. IMPRESSION: Cardiomegaly. Central mild vascular congestion and minimal perihilar interstitial prominence suspicious for mild interstitial edema. Mild basilar atelectasis. Four leads cardiac pacemaker in place. Electronically Signed   By: Lahoma Crocker M.D.   On: 06/07/2015 08:17   I have personally reviewed and evaluated these images and lab results as part of my medical decision-making.   EKG Interpretation   Date/Time:  Wednesday Jun 07 2015 07:53:00 EDT Ventricular Rate:  107 PR Interval:  47 QRS Duration: 172 QT Interval:  438 QTC Calculation: 584 R Axis:   -76 Text Interpretation:  rhythm indeterminate Paired ventricular premature  complexes IVCD, consider atypical RBBB Probable anterior infarct, age  indeterminate Confirmed by Christy Gentles  MD, Cyani Kallstrom ([id number]) on 06/07/2015  8:03:27 AM      Medications  furosemide (LASIX) injection 40 mg (not administered)    MDM   Final diagnoses:  Acute systolic congestive heart failure [facility])    Nursing notes including past medical history and social history reviewed and considered in documentation xrays/imaging reviewed by myself and considered during evaluation Labs/vital reviewed myself and considered during evaluation     Ripley Fraise, MD 06/07/15 [phone]

## 2015-06-07 NOTE — ED Notes (Signed)
CRITICAL VALUE ALERT  Critical value received:  Troponin 0.59  Date of notification:  06/07/15  Time of notification:  0836  Critical value read back:Yes.    Nurse who received alert:  Charmayne Sheer, RN  MD notified (1st page):  [facility]

## 2015-06-07 NOTE — ED Notes (Signed)
Patient unable to stand

## 2015-06-07 NOTE — ED Notes (Signed)
Pt comes in from home for generalized weakness starting 3 days ago. Pt is currently on acyclovir and believes he is on it for pneumonia. Pt has had non-productive cough. Pt is alert to place and time. Pt denies any pain.   CBG 140

## 2015-06-08 ENCOUNTER — Encounter (HOSPITAL_COMMUNITY): Payer: Self-pay | Admitting: Primary Care

## 2015-06-08 DIAGNOSIS — Z515 Encounter for palliative care: Secondary | ICD-10-CM

## 2015-06-08 DIAGNOSIS — R296 Repeated falls: Secondary | ICD-10-CM

## 2015-06-08 DIAGNOSIS — Z7189 Other specified counseling: Secondary | ICD-10-CM | POA: Insufficient documentation

## 2015-06-08 DIAGNOSIS — F0390 Unspecified dementia without behavioral disturbance: Secondary | ICD-10-CM

## 2015-06-08 LAB — BASIC METABOLIC PANEL
ANION GAP: 9 (ref 5–15)
BUN: 61 mg/dL — ABNORMAL HIGH (ref 6–20)
CALCIUM: 9.3 mg/dL (ref 8.9–10.3)
CO2: 26 mmol/L (ref 22–32)
Chloride: 106 mmol/L (ref 101–111)
Creatinine, Ser: 3.06 mg/dL — ABNORMAL HIGH (ref 0.61–1.24)
GFR, EST AFRICAN AMERICAN: 21 mL/min — AB (ref >60)
GFR, EST NON AFRICAN AMERICAN: 18 mL/min — AB (ref >60)
GLUCOSE: 108 mg/dL — AB (ref 65–99)
POTASSIUM: 4.3 mmol/L (ref 3.5–5.1)
SODIUM: 141 mmol/L (ref 135–145)

## 2015-06-08 LAB — CBC
HCT: 36 % — ABNORMAL LOW (ref 39.0–52.0)
Hemoglobin: 11.7 g/dL — ABNORMAL LOW (ref 13.0–17.0)
MCH: 28.5 pg (ref 26.0–34.0)
MCHC: 32.5 g/dL (ref 30.0–36.0)
MCV: 87.8 fL (ref 78.0–100.0)
PLATELETS: 170 10*3/uL (ref 150–400)
RBC: 4.1 MIL/uL — AB (ref 4.22–5.81)
RDW: 19.7 % — ABNORMAL HIGH (ref 11.5–15.5)
WBC: 7.3 10*3/uL (ref 4.0–10.5)

## 2015-06-08 LAB — GLUCOSE, CAPILLARY
GLUCOSE-CAPILLARY: 97 mg/dL (ref 65–99)
GLUCOSE-CAPILLARY: 131 mg/dL — AB (ref 65–99)

## 2015-06-08 LAB — TROPONIN I: Troponin I: 0.8 ng/mL (ref <0.031)

## 2015-06-08 LAB — HEMOGLOBIN A1C
Hgb A1c MFr Bld: 6.4 % — ABNORMAL HIGH (ref 4.8–5.6)
MEAN PLASMA GLUCOSE: 137 mg/dL

## 2015-06-08 MED ORDER — POLYVINYL ALCOHOL 1.4 % OP SOLN
OPHTHALMIC | Status: AC
  Filled 2015-06-08: qty 15

## 2015-06-08 MED ORDER — DONEPEZIL HCL 5 MG PO TABS
10.0000 mg | ORAL_TABLET | Freq: Every day | ORAL | Status: DC
  Administered 2015-06-08 – 2015-06-13 (×6): 10 mg via ORAL
  Filled 2015-06-08 (×6): qty 2

## 2015-06-08 NOTE — Progress Notes (Deleted)
**Note De-Identified  Obfuscation** ETT advanced  3cm to 27 @ lip CXR done for placement.

## 2015-06-08 NOTE — Care Management Note (Signed)
Case Management Note  Patient Details  Name: [name] Macias MRN: [id number] Date of Birth: 08-06-1932  Subjective/Objective:                  Pt is from home, lives with his wife, both are beginning stages of dementia. Pt has a cane and walker for mobility at home. Pt is has home O2 with port tank. Pt uses oxygen every night and sometimes during the day. Pt does not have a neb machine. Pt is active with [facility]. Per Marti Sleigh (pt's family) they have been working on placement from home P[facility] at the [facility] [place]. Pt has palliative consult.  CSW aware of family intent to place.   Action/Plan: Palliative and PT eval pending. Plan for DC home with [facility] vs SNF vs ALF.  Expected Discharge Date:     06/08/2015             Expected Discharge Plan:  Skilled Nursing Facility  In-House Referral:  Clinical Social Work  Discharge planning Services  CM Consult  Post Acute Care Choice:  NA Choice offered to:  NA  DME Arranged:    DME Agency:     HH Arranged:    HH Agency:     Status of Service:  In process, will continue to follow  Medicare Important Message Given:    Date Medicare IM Given:    Medicare IM give by:    Date Additional Medicare IM Given:    Additional Medicare Important Message give by:     If discussed at [place] of Stay Meetings, dates discussed:    Additional Comments:  Sherald Barge, RN 06/08/2015, 12:57 PM

## 2015-06-08 NOTE — Progress Notes (Addendum)
PT Cancellation Note  Patient Details Name: [name] Macias MRN: [id number] DOB: 04-18-1932   Cancelled Treatment:    Reason Eval/Treat Not Completed: Patient not medically ready - Chart review reveals pt with elevated troponin trending up at 0.80 with EF of 15-20%.  Will check back tomorrow to complete initial PT evaluation.    Beth Zienna Ahlin, PT, DPT X: [phone]   06/08/2015, 8:44 AM

## 2015-06-08 NOTE — Consult Note (Signed)
Consultation Note Date: 06/08/2015   Patient Name: [name] Macias  DOB: 05-23-1932  MRN: [id number]  Age / Sex: 82 y.o., male  PCP: Drue Novel, NP Referring Physician: Sinda Du, MD  Reason for Consultation: Disposition, Establishing goals of care and Psychosocial/spiritual support  HPI/Patient Profile: 82 y.o. male  with past medical history of non-obstructive CAD, non-ischemic cardiomyopathy with recent EF of 20-25%, Echo 03/2015, AICD in situ, diverticular bleed and 10/2014, chronic kidney disease, prostate cancer, and mild dementia admitted on 06/07/2015 with cardiomegly, mild pulmonary edema, acute on chronic CHF, admitted for further evaluation and management.   Clinical Assessment and Goals of Care: [name] Macias is resting quietly in bed. He makes eye contact and greets me as I enter. There is no family at bedside at this time.  He denies pain or complaints, but he does look frail and thin. He tells me that he's had several falls in the last few months. We talk about his home life; he shares that his wife's (27 years old) health is not too good either. He's tells me that he is able to do his own bathing and dressing, and he does most of the cooking at home. He smiles and states that he likes to grow a garden every year. He shares that sister-in-law [name] Macias drives them to doctors appointment and grocery shops for them from a list.  His wife calls on the phone during this time, and I speak with her. She speaks of [name] Macias falls, how she is not able to pick him up, and how they are now being charged $200 per visit when they have to call emergency services to pick him up. She shares that if possible, she would like for him to have rehab in the [place] or [facility]. She says that during this time she will be with her sister.    [name] Macias and I speak about healthcare power of attorney,  and advanced directives. As noted below.  [name] Macias agrees for me to call his sister-in-law [name] Macias to discuss advanced directives.  OTHER, [name] Macias is unable to fully make his own decisions at this time. He shares that he has not named a healthcare power of attorney. I share that his wife Davi Macias will be his decision maker if he is unable. He shares that [name] Macias's son and daughter, "will do anything for me". He also speaks of [name] Macias sister, [name] Macias, as a caregiver.   SUMMARY OF RECOMMENDATIONS   Continue with supportive measures, goal for rehab for strength training and mobility, return to home with more supportive in home services such as [facility], or [facility] aid program, if eligible.  Code Status/Advance Care Planning:  Full code - [name] shares that he would want chest compressions at this time. He shares, "only 3 rounds". We talk about intubation/ventilation, and he states, "that's what I mean, only 3 rounds". I share that he may need help with his breathing even if his heart were to be  fine. He is unable to make any decisions at this time.   Symptom Management:   per Dr. Luan Pulling at this time.  Palliative Prophylaxis:   Turn Reposition  Additional Recommendations (Limitations, Scope, Preferences):  Treat the treatable at this time, rehab if possible for strength and mobility, goal is to return to his own home.  Chest compressions , "only 3 rounds".  Psycho-social/Spiritual:   Desire for further Chaplaincy support: not discussed today  Additional Recommendations: Caregiving  Support/Resources  Prognosis:   < 6 months, likely based on decreased functional status, 3 hospitalizations in the last 6 months, chronic disease burden with heart failure.  Discharge Planning: SNF for rehab if possible, 1st Choice [name] Macias., [name] Macias 2nd choice [facility] at      Primary Diagnoses: Present on Admission:  . Acute on chronic systolic CHF (congestive heart  failure) ([place]) . Automatic implantable cardioverter-defibrillator in situ . Prediabetes . Essential hypertension . COPD with chronic bronchitis ([place]) . Elevated troponin I level . Dementia . Chronic respiratory failure with hypoxia ([place]) . Stage III chronic kidney disease  I have reviewed the medical record, interviewed the patient and family, and examined the patient. The following aspects are pertinent.  Past Medical History  Diagnosis Date  . Arthritis   . Gout   . Non-ischemic cardiomyopathy (HCC)     LVEF 10-15%  . Essential hypertension   . CAD (coronary artery disease)     Nonobstructive 2004  . COPD (chronic obstructive pulmonary disease) ([place])   . History of syphilis   . Type 2 HSV infection of penis   . Erectile dysfunction   . Anxiety disorder   . Dementia   . GERD (gastroesophageal reflux disease)   . Ascending aortic aneurysm ([place])   . Lung nodule     RUL nodule noted on abdominal CT  . Automatic implantable cardioverter-defibrillator in situ 07/20/2008  . Chronic systolic heart failure ([place])   . CKD (chronic kidney disease) stage 3, GFR 30-59 ml/min   . Prostate cancer ([place])     Diagnosed in 01/2013 by Dr Michela Pitcher adenocarcinoma felt to be localized, gleason score 6 with a pSA level of 8.4. No bone scan by Dr Michela Pitcher, referred to oncology, no radiation treatment offered    . NSTEMI (non-ST elevated myocardial infarction) ([place]) 01/24/2014  . GI (gastrointestinal bleed) 10/28/2014    Secondary to diverticular bleed.   Social History   Social History  . Marital Status: Married    Spouse Name: N/A  . Number of Children: 3  . Years of Education: N/A   Occupational History  . disabled     Social History Main Topics  . Smoking status: Former Smoker -- 1.00 packs/day for 10 years    Types: Cigarettes    Quit date: 01/15/1963  . Smokeless tobacco: Never Used  . Alcohol Use: No  . Drug Use: No  . Sexual Activity: Not Currently   Other Topics Concern  . None     Social History Narrative   Lives with his second wife currently at home   Former heavy smoker unclear when he quit   Chronically uses oxygen   Father had some diagnosis of cancer   Family History  Problem Relation Age of Onset  . Heart failure Father   . Prostate cancer Brother   . Diabetes Brother   . Hypertension Brother   . Heart failure Brother    Scheduled Meds: . allopurinol  100 mg Oral Daily  . aspirin  81 mg Oral Daily  . carvedilol  12.5 mg Oral BID WC  . docusate sodium  100 mg Oral BID  . donepezil  10 mg Oral QHS  . enoxaparin (LOVENOX) injection  30 mg Subcutaneous Q24H  . ferrous sulfate  325 mg Oral Q breakfast  . furosemide  40 mg Intravenous Q12H  . isosorbide dinitrate  10 mg Oral BID  . levalbuterol  0.63 mg Nebulization TID  . oxyCODONE-acetaminophen  1 tablet Oral QID  . pantoprazole  40 mg Oral Daily  . polyvinyl alcohol  1 drop Both Eyes TID  . potassium chloride  20 mEq Oral BID  . sertraline  50 mg Oral Daily  . sodium chloride flush  3 mL Intravenous Q12H  . spironolactone  25 mg Oral Daily  . temazepam  7.5 mg Oral QHS   Continuous Infusions:  PRN Meds:.ALPRAZolam, nitroGLYCERIN Medications Prior to Admission:  Prior to Admission medications   Medication Sig Start Date End Date Taking? Authorizing Provider  acetaminophen (TYLENOL) 500 MG tablet Take 1,000 mg by mouth daily as needed for moderate pain.    Yes Historical Provider, MD  allopurinol (ZYLOPRIM) 100 MG tablet Take 1 tablet (100 mg total) by mouth daily. 03/20/15  Yes Sinda Du, MD  ALPRAZolam Duanne Moron) 0.25 MG tablet Take 0.25 mg by mouth 2 (two) times daily as needed for anxiety.  10/24/14  Yes Historical Provider, MD  carvedilol (COREG) 25 MG tablet Take 25 mg by mouth 2 (two) times daily with a meal.   Yes Historical Provider, MD  donepezil (ARICEPT) 5 MG tablet Take 1 tablet (5 mg total) by mouth at bedtime. 03/20/15  Yes Sinda Du, MD  ferrous sulfate (FERROUSUL) 325 (65  FE) MG tablet Take 1 tablet (325 mg total) by mouth daily with breakfast. 10/31/14  Yes Sinda Du, MD  furosemide (LASIX) 40 MG tablet Take 1 tablet (40 mg total) by mouth 2 (two) times daily. 11/03/13  Yes Fayrene Helper, MD  HYDROcodone-acetaminophen (NORCO) 10-325 MG tablet Take 1 tablet by mouth every 6 (six) hours as needed for moderate pain.   Yes Historical Provider, MD  isosorbide dinitrate (ISORDIL) 10 MG tablet Take 10 mg by mouth 2 (two) times daily.  03/17/15  Yes Historical Provider, MD  losartan (COZAAR) 25 MG tablet Take 1 tablet (25 mg total) by mouth daily. 11/03/13  Yes Fayrene Helper, MD  nitroGLYCERIN (NITROSTAT) 0.4 MG SL tablet Place 1 tablet (0.4 mg total) under the tongue every 5 (five) minutes as needed for chest pain. For chest pain 03/18/14  Yes Evans Lance, MD  oxyCODONE-acetaminophen (PERCOCET/ROXICET) 5-325 MG tablet Take 1 tablet by mouth 4 (four) times daily. Reported on 05/03/2015 03/16/15  Yes Historical Provider, MD  OXYGEN Inhale 2 L into the lungs at bedtime. May use daily as needed   Yes Historical Provider, MD  pantoprazole (PROTONIX) 40 MG tablet Take 40 mg by mouth daily. Reported on 04/06/2015 02/10/15  Yes Historical Provider, MD  sertraline (ZOLOFT) 50 MG tablet Take 50 mg by mouth daily. 04/11/14  Yes Historical Provider, MD  spironolactone (ALDACTONE) 25 MG tablet Take 1 tablet (25 mg total) by mouth daily. 11/03/13  Yes Fayrene Helper, MD  temazepam (RESTORIL) 30 MG capsule Take 30 mg by mouth at bedtime.   Yes Historical Provider, MD   Allergies  Allergen Reactions  . Codeine Hives  . Enalapril Maleate Hives    hives  . Sulfonamide Derivatives Hives   Review of  Systems  Unable to perform ROS: Dementia    Physical Exam  Constitutional: No distress.  Thin and frail  HENT:  Head: Normocephalic and atraumatic.  Cardiovascular: Normal rate.   Pulmonary/Chest: Effort normal.  Chronic use of O2, some work of breathing noted with  lengthy conversation. Course breath sounds  Abdominal: Soft. Bowel sounds are normal. There is no guarding.  Neurological: He is alert.  Oriented times 2, unable to tell me the year or the month  Skin: Skin is warm and dry.  Psychiatric: He has a normal mood and affect.  Nursing note and vitals reviewed.   Vital Signs: BP 120/72 mmHg  Pulse 69  Temp(Src) 97.8 F (36.6 C) (Axillary)  Resp 15  Ht 5\' 6"  (1.676 m)  Wt 68 kg (149 lb 14.6 oz)  BMI 24.21 kg/m2  SpO2 94% Pain Assessment: No/denies pain   Pain Score: 0-No pain   SpO2: SpO2: 94 % O2 Device:SpO2: 94 % O2 Flow Rate: .O2 Flow Rate (L/min): 2 L/min  IO: Intake/output summary:  Intake/Output Summary (Last 24 hours) at 06/08/15 1440 Last data filed at 06/08/15 1400  Gross per 24 hour  Intake    720 ml  Output    850 ml  Net   -130 ml    LBM: Last BM Date: 06/07/15 Baseline Weight: Weight: 68.3 kg (150 lb 9.2 oz) Most recent weight: Weight: 68 kg (149 lb 14.6 oz)     Palliative Assessment/Data:   Flowsheet Rows        Most Recent Value   Intake Tab    Referral Department  Pulmonary   Unit at Time of Referral  ICU   Palliative Care Primary Diagnosis  Pulmonary   Date Notified  06/08/15   Palliative Care Type  New Palliative care   Reason for referral  Clarify Goals of Care   Date of Admission  06/07/15   Date first seen by Palliative Care  06/08/15   # of days Palliative referral response time  0 Day(s)   # of days IP prior to Palliative referral  1   Clinical Assessment    Palliative Performance Scale Score  40%   Pain Max last 24 hours  Not able to report   Pain Min Last 24 hours  Not able to report   Dyspnea Max Last 24 Hours  Not able to report   Dyspnea Min Last 24 hours  Not able to report   Psychosocial & Spiritual Assessment    Palliative Care Outcomes    Patient/Family meeting held?  Yes   Who was at the meeting?  patient only, briefly wife on phone   Palliative Care Outcomes  Clarified goals  of care, Provided advance care planning, Provided psychosocial or spiritual support   Palliative Care follow-up planned  -- [follow up at [facility]]      Time In: 1340 Time Out: 1430 Time Total: 50 minutes Greater than 50%  of this time was spent counseling and coordinating care related to the above assessment and plan.  Signed by: Drue Novel, NP   Please contact Palliative Medicine Team phone at [phone] for questions and concerns.  For individual provider: See Shea Evans

## 2015-06-08 NOTE — Consult Note (Signed)
   [facility] CM Inpatient Consult   06/08/2015  [name] Macias 1932/03/21 [id number]  Stopped by patient room, patient sleeping and no family present. Patient screened for potential [facility] services. Patient is eligible for [address], however, after review of electronic medical record reveals patient's discharge plan is skilled nursing home, there were no identifiable [facility] care management needs at this time. [facility] Care Management services not appropriate at this time.   If patient's post hospital needs change please place a [facility] Care Management consult.  Of note, [facility] program services would not interfere or replace any services arranged by inpatient care management or social work. For questions please contact:   Royetta Crochet. Laymond Purser, RN, BSN, [place] [phone]) Business Cell  [phone]) Toll Free Office

## 2015-06-08 NOTE — Progress Notes (Signed)
Subjective: He came in with acute on chronic heart failure. He's had several admissions to the hospital with 3 in the last 6 months. He has very severe congestive heart failure with cardiac ejection fraction somewhere between 15 and 20% he says he feels better. He is lying flat right now.  Objective: Vital signs in last 24 hours: Temp:  [96.1 F (35.6 C)-98.1 F (36.7 C)] 96.4 F (35.8 C) (05/25 0813) Pulse Rate:  [69-111] 69 (05/25 0800) Resp:  [9-34] 9 (05/25 0800) BP: (92-154)/(60-128) 100/68 mmHg (05/25 0800) SpO2:  [86 %-100 %] 100 % (05/25 0800) Weight:  [68 kg (149 lb 14.6 oz)-68.3 kg (150 lb 9.2 oz)] 68 kg (149 lb 14.6 oz) (05/25 0600) Weight change:  Last BM Date: 06/07/15  Intake/Output from previous day: 05/24 0701 - 05/25 0700 In: 240 [P.O.:240] Out: 500 [Urine:500]  PHYSICAL EXAM General appearance: alert, cooperative and mild distress Resp: He still has some rales in the bases Cardio: His heart is regular and I don't hear an S3 gallop now GI: soft, non-tender; bowel sounds normal; no masses,  no organomegaly Extremities: Pretibial edema is better  Lab Results:  Results for orders placed or performed during the hospital encounter of 06/07/15 (from the past 48 hour(s))  Basic metabolic panel     Status: Abnormal   Collection Time: 06/07/15  7:53 AM  Result Value Ref Range   Sodium 139 135 - 145 mmol/L   Potassium 3.3 (L) 3.5 - 5.1 mmol/L   Chloride 104 101 - 111 mmol/L   CO2 24 22 - 32 mmol/L   Glucose, Bld 116 (H) 65 - 99 mg/dL   BUN 53 (H) 6 - 20 mg/dL   Creatinine, Ser 2.60 (H) 0.61 - 1.24 mg/dL   Calcium 9.4 8.9 - 10.3 mg/dL   GFR calc non Af Amer 22 (L) >60 mL/min   GFR calc Af Amer 25 (L) >60 mL/min    Comment: (NOTE) The eGFR has been calculated using the CKD EPI equation. This calculation has not been validated in all clinical situations. eGFR's persistently <60 mL/min signify possible Chronic Kidney Disease.    Anion gap 11 5 - 15  CBC with  Differential/Platelet     Status: Abnormal   Collection Time: 06/07/15  7:53 AM  Result Value Ref Range   WBC 5.2 4.0 - 10.5 K/uL   RBC 4.27 4.22 - 5.81 MIL/uL   Hemoglobin 12.0 (L) 13.0 - 17.0 g/dL   HCT 37.2 (L) 39.0 - 52.0 %   MCV 87.1 78.0 - 100.0 fL   MCH 28.1 26.0 - 34.0 pg   MCHC 32.3 30.0 - 36.0 g/dL   RDW 19.5 (H) 11.5 - 15.5 %   Platelets 163 150 - 400 K/uL   Neutrophils Relative % 71 %   Neutro Abs 3.7 1.7 - 7.7 K/uL   Lymphocytes Relative 18 %   Lymphs Abs 0.9 0.7 - 4.0 K/uL   Monocytes Relative 9 %   Monocytes Absolute 0.5 0.1 - 1.0 K/uL   Eosinophils Relative 2 %   Eosinophils Absolute 0.1 0.0 - 0.7 K/uL   Basophils Relative 0 %   Basophils Absolute 0.0 0.0 - 0.1 K/uL  Troponin I     Status: Abnormal   Collection Time: 06/07/15  7:53 AM  Result Value Ref Range   Troponin I 0.59 (HH) <0.031 ng/mL    Comment: CRITICAL RESULT CALLED TO, READ BACK BY AND VERIFIED WITH: ROBINSON,L AT 8:35AM ON 06/07/15 BY Derrill Memo  POSSIBLE MYOCARDIAL ISCHEMIA. SERIAL TESTING RECOMMENDED.   Brain natriuretic peptide     Status: Abnormal   Collection Time: 06/07/15  7:53 AM  Result Value Ref Range   B Natriuretic Peptide 3946.0 (H) 0.0 - 100.0 pg/mL  Urinalysis, Routine w reflex microscopic (not at [facility])     Status: Abnormal   Collection Time: 06/07/15  8:55 AM  Result Value Ref Range   Color, Urine YELLOW YELLOW   APPearance HAZY (A) CLEAR   Specific Gravity, Urine 1.020 1.005 - 1.030   pH 5.0 5.0 - 8.0   Glucose, UA NEGATIVE NEGATIVE mg/dL   Hgb urine dipstick NEGATIVE NEGATIVE   Bilirubin Urine SMALL (A) NEGATIVE   Ketones, ur NEGATIVE NEGATIVE mg/dL   Protein, ur 100 (A) NEGATIVE mg/dL   Nitrite NEGATIVE NEGATIVE   Leukocytes, UA NEGATIVE NEGATIVE  Urine microscopic-add on     Status: Abnormal   Collection Time: 06/07/15  8:55 AM  Result Value Ref Range   Squamous Epithelial / LPF 0-5 (A) NONE SEEN   WBC, UA NONE SEEN 0 - 5 WBC/hpf   RBC / HPF 0-5 0 - 5  RBC/hpf   Bacteria, UA FEW (A) NONE SEEN  TSH     Status: None   Collection Time: 06/07/15 12:20 PM  Result Value Ref Range   TSH 1.793 0.350 - 4.500 uIU/mL  Protime-INR     Status: Abnormal   Collection Time: 06/07/15 12:20 PM  Result Value Ref Range   Prothrombin Time 17.9 (H) 11.6 - 15.2 seconds   INR 1.47 0.00 - 1.49  Troponin I     Status: Abnormal   Collection Time: 06/07/15 12:20 PM  Result Value Ref Range   Troponin I 0.59 (HH) <0.031 ng/mL    Comment: DELTA CHECK NOTED CRITICAL RESULT CALLED TO, READ BACK BY AND VERIFIED WITH: MCDANEILS,M AT 1350 ON 06/07/2015 BY ISLEY,B        POSSIBLE MYOCARDIAL ISCHEMIA. SERIAL TESTING RECOMMENDED.   Hemoglobin A1c     Status: Abnormal   Collection Time: 06/07/15 12:30 PM  Result Value Ref Range   Hgb A1c MFr Bld 6.4 (H) 4.8 - 5.6 %    Comment: (NOTE)         Pre-diabetes: 5.7 - 6.4         Diabetes: >6.4         Glycemic control for adults with diabetes: <7.0    Mean Plasma Glucose 137 mg/dL    Comment: (NOTE) Performed At: [facility] [place], [place] [id number] Lindon Romp MD [id number]   MRSA PCR Screening     Status: None   Collection Time: 06/07/15  1:55 PM  Result Value Ref Range   MRSA by PCR NEGATIVE NEGATIVE    Comment:        The GeneXpert MRSA Assay (FDA approved for NASAL specimens only), is one component of a comprehensive MRSA colonization surveillance program. It is not intended to diagnose MRSA infection nor to guide or monitor treatment for MRSA infections.   Troponin I     Status: Abnormal   Collection Time: 06/07/15  5:25 PM  Result Value Ref Range   Troponin I 0.70 (HH) <0.031 ng/mL    Comment:        POSSIBLE MYOCARDIAL ISCHEMIA. SERIAL TESTING RECOMMENDED. CRITICAL VALUE NOTED.  VALUE IS CONSISTENT WITH PREVIOUSLY REPORTED AND CALLED VALUE.   Troponin I     Status: Abnormal   Collection Time: 06/07/15 11:58 PM  Result Value Ref Range   Troponin I 0.80  (HH) <0.031 ng/mL    Comment:        POSSIBLE MYOCARDIAL ISCHEMIA. SERIAL TESTING RECOMMENDED. CRITICAL VALUE NOTED.  VALUE IS CONSISTENT WITH PREVIOUSLY REPORTED AND CALLED VALUE.   CBC     Status: Abnormal   Collection Time: 06/08/15  5:24 AM  Result Value Ref Range   WBC 7.3 4.0 - 10.5 K/uL   RBC 4.10 (L) 4.22 - 5.81 MIL/uL   Hemoglobin 11.7 (L) 13.0 - 17.0 g/dL   HCT 36.0 (L) 39.0 - 52.0 %   MCV 87.8 78.0 - 100.0 fL   MCH 28.5 26.0 - 34.0 pg   MCHC 32.5 30.0 - 36.0 g/dL   RDW 19.7 (H) 11.5 - 15.5 %   Platelets 170 150 - 400 K/uL  Basic metabolic panel     Status: Abnormal   Collection Time: 06/08/15  5:24 AM  Result Value Ref Range   Sodium 141 135 - 145 mmol/L   Potassium 4.3 3.5 - 5.1 mmol/L    Comment: DELTA CHECK NOTED   Chloride 106 101 - 111 mmol/L   CO2 26 22 - 32 mmol/L   Glucose, Bld 108 (H) 65 - 99 mg/dL   BUN 61 (H) 6 - 20 mg/dL   Creatinine, Ser 3.06 (H) 0.61 - 1.24 mg/dL   Calcium 9.3 8.9 - 10.3 mg/dL   GFR calc non Af Amer 18 (L) >60 mL/min   GFR calc Af Amer 21 (L) >60 mL/min    Comment: (NOTE) The eGFR has been calculated using the CKD EPI equation. This calculation has not been validated in all clinical situations. eGFR's persistently <60 mL/min signify possible Chronic Kidney Disease.    Anion gap 9 5 - 15  Glucose, capillary     Status: None   Collection Time: 06/08/15  7:54 AM  Result Value Ref Range   Glucose-Capillary 97 65 - 99 mg/dL   Comment 1 Notify RN    Comment 2 Document in Chart     ABGS No results for input(s): PHART, PO2ART, TCO2, HCO3 in the last 72 hours.  Invalid input(s): PCO2 CULTURES Recent Results (from the past 240 hour(s))  MRSA PCR Screening     Status: None   Collection Time: 06/07/15  1:55 PM  Result Value Ref Range Status   MRSA by PCR NEGATIVE NEGATIVE Final    Comment:        The GeneXpert MRSA Assay (FDA approved for NASAL specimens only), is one component of a comprehensive MRSA  colonization surveillance program. It is not intended to diagnose MRSA infection nor to guide or monitor treatment for MRSA infections.    Studies/Results: Ct Head Wo Contrast  06/07/2015  CLINICAL DATA:  Generalized weakness EXAM: CT HEAD WITHOUT CONTRAST TECHNIQUE: Contiguous axial images were obtained from the base of the skull through the vertex without intravenous contrast. COMPARISON:  03/17/2015 FINDINGS: Bony calvarium is intact. No gross soft tissue abnormality is noted. Mild atrophic changes are again identified. Chronic white matter ischemic change is seen as well. No findings to suggest acute hemorrhage, acute infarction or space-occupying mass lesion are noted. IMPRESSION: Chronic atrophic and ischemic changes without acute intracranial abnormality. Electronically Signed   By: Inez Catalina M.D.   On: 06/07/2015 12:46   Dg Chest Portable 1 View  06/07/2015  CLINICAL DATA:  Generalized weakness, nonproductive cough for 3 days EXAM: PORTABLE CHEST 1 VIEW COMPARISON:  03/17/2015 and 12/10/2014 FINDINGS: Cardiomegaly is noted.  Central mild vascular congestion and mild perihilar interstitial prominence suspicious for minimal interstitial edema. Mild left basilar atelectasis. The patient is slight rotated to the right. 4 leads cardiac pacemaker is unchanged in position. Atherosclerotic calcifications of thoracic aorta. IMPRESSION: Cardiomegaly. Central mild vascular congestion and minimal perihilar interstitial prominence suspicious for mild interstitial edema. Mild basilar atelectasis. Four leads cardiac pacemaker in place. Electronically Signed   By: Lahoma Crocker M.D.   On: 06/07/2015 08:17    Medications:  Prior to Admission:  Prescriptions prior to admission  Medication Sig Dispense Refill Last Dose  . acetaminophen (TYLENOL) 500 MG tablet Take 1,000 mg by mouth daily as needed for moderate pain.    Past Week at Unknown time  . allopurinol (ZYLOPRIM) 100 MG tablet Take 1 tablet (100 mg  total) by mouth daily. 90 tablet 1 06/06/2015 at Unknown time  . ALPRAZolam (XANAX) 0.25 MG tablet Take 0.25 mg by mouth 2 (two) times daily as needed for anxiety.   2 06/06/2015 at Unknown time  . carvedilol (COREG) 25 MG tablet Take 25 mg by mouth 2 (two) times daily with a meal.   06/06/2015 at Unknown time  . donepezil (ARICEPT) 5 MG tablet Take 1 tablet (5 mg total) by mouth at bedtime. 30 tablet 12 06/06/2015 at Unknown time  . ferrous sulfate (FERROUSUL) 325 (65 FE) MG tablet Take 1 tablet (325 mg total) by mouth daily with breakfast. 30 tablet 3 06/06/2015 at Unknown time  . furosemide (LASIX) 40 MG tablet Take 1 tablet (40 mg total) by mouth 2 (two) times daily. 180 tablet 1 06/06/2015 at Unknown time  . HYDROcodone-acetaminophen (NORCO) 10-325 MG tablet Take 1 tablet by mouth every 6 (six) hours as needed for moderate pain.   06/06/2015 at Unknown time  . isosorbide dinitrate (ISORDIL) 10 MG tablet Take 10 mg by mouth 2 (two) times daily.    06/06/2015 at Unknown time  . losartan (COZAAR) 25 MG tablet Take 1 tablet (25 mg total) by mouth daily. 90 tablet 1 06/06/2015 at Unknown time  . nitroGLYCERIN (NITROSTAT) 0.4 MG SL tablet Place 1 tablet (0.4 mg total) under the tongue every 5 (five) minutes as needed for chest pain. For chest pain 30 tablet 6 unknown  . oxyCODONE-acetaminophen (PERCOCET/ROXICET) 5-325 MG tablet Take 1 tablet by mouth 4 (four) times daily. Reported on 05/03/2015   Past Week at Unknown time  . OXYGEN Inhale 2 L into the lungs at bedtime. May use daily as needed   continuous  . pantoprazole (PROTONIX) 40 MG tablet Take 40 mg by mouth daily. Reported on 04/06/2015   06/06/2015 at Unknown time  . sertraline (ZOLOFT) 50 MG tablet Take 50 mg by mouth daily.  5 06/06/2015 at Unknown time  . spironolactone (ALDACTONE) 25 MG tablet Take 1 tablet (25 mg total) by mouth daily. 90 tablet 1 06/06/2015 at Unknown time  . temazepam (RESTORIL) 30 MG capsule Take 30 mg by mouth at bedtime.    06/06/2015 at Unknown time   Scheduled: . allopurinol  100 mg Oral Daily  . aspirin  81 mg Oral Daily  . carvedilol  12.5 mg Oral BID WC  . docusate sodium  100 mg Oral BID  . donepezil  5 mg Oral QHS  . enoxaparin (LOVENOX) injection  30 mg Subcutaneous Q24H  . ferrous sulfate  325 mg Oral Q breakfast  . furosemide  40 mg Intravenous Q12H  . isosorbide dinitrate  10 mg Oral BID  . levalbuterol  0.63 mg Nebulization TID  . oxyCODONE-acetaminophen  1 tablet Oral QID  . pantoprazole  40 mg Oral Daily  . polyvinyl alcohol  1 drop Both Eyes TID  . potassium chloride  20 mEq Oral BID  . sertraline  50 mg Oral Daily  . sodium chloride flush  3 mL Intravenous Q12H  . spironolactone  25 mg Oral Daily  . temazepam  7.5 mg Oral QHS   Continuous:  [id number], nitroGLYCERIN  Assesment: He was admitted with acute on chronic systolic heart failure. He's better after diuresis. However he is only down about 250 mL according to intake and output. He is able to lie flat.  He has had multiple falls. I had evaluated him and had him set up to go to a skilled care facility from my office within his family changed their mind and did not have him go. He may need to be evaluated for this again  He has some dementia at baseline and has had significant issues with confusion while he's been in the hospital in the past  He has chronic hypoxic respiratory failure from COPD  He has elevated troponin level probably related to his chronic kidney disease and his chronic heart failure. Principal Problem:   Acute on chronic systolic CHF (congestive heart failure) (HCC) Active Problems:   Automatic implantable cardioverter-defibrillator in situ   Prediabetes   Essential hypertension   Stage III chronic kidney disease   COPD with chronic bronchitis (HCC)   Elevated troponin I level   Generalized weakness   Multiple falls   Dementia   Chronic respiratory failure with hypoxia (HCC)    Plan: Continue  diuresis. I will request palliative care consultation for goal setting. PT consultation to see if he's a candidate for skilled care rehabilitation    LOS: 1 day   Obera Stauch L 06/08/2015, 8:31 AM

## 2015-06-08 NOTE — Progress Notes (Signed)
Pharmacist Heart Failure Core Measure Documentation  Assessment: [name] Macias has an EF documented as 20-25% on 03/2015 by echo.  Rationale: Heart failure patients with left ventricular systolic dysfunction (LVSD) and an EF < 40% should be prescribed an angiotensin converting enzyme inhibitor (ACEI) or angiotensin receptor blocker (ARB) at discharge unless a contraindication is documented in the medical record.  This patient is not currently on an ACEI or ARB for HF.  This note is being placed in the record in order to provide documentation that a contraindication to the use of these agents is present for this encounter.  ACE Inhibitor or Angiotensin Receptor Blocker is contraindicated (specify all that apply)  []   ACEI allergy AND ARB allergy []   Angioedema []   Moderate or severe aortic stenosis []   Hyperkalemia []   Hypotension []   Renal artery stenosis [x]   Worsening renal function, preexisting renal disease or dysfunction   [name] Macias A 06/08/2015 2:12 PM

## 2015-06-09 DIAGNOSIS — R7989 Other specified abnormal findings of blood chemistry: Secondary | ICD-10-CM

## 2015-06-09 DIAGNOSIS — N183 Chronic kidney disease, stage 3 (moderate): Secondary | ICD-10-CM

## 2015-06-09 DIAGNOSIS — I429 Cardiomyopathy, unspecified: Secondary | ICD-10-CM

## 2015-06-09 DIAGNOSIS — I5023 Acute on chronic systolic (congestive) heart failure: Secondary | ICD-10-CM

## 2015-06-09 LAB — BASIC METABOLIC PANEL
Anion gap: 8 (ref 5–15)
BUN: 67 mg/dL — ABNORMAL HIGH (ref 6–20)
CO2: 26 mmol/L (ref 22–32)
CREATININE: 2.96 mg/dL — AB (ref 0.61–1.24)
Calcium: 9.2 mg/dL (ref 8.9–10.3)
Chloride: 105 mmol/L (ref 101–111)
GFR calc non Af Amer: 18 mL/min — ABNORMAL LOW (ref >60)
GFR, EST AFRICAN AMERICAN: 21 mL/min — AB (ref >60)
Glucose, Bld: 106 mg/dL — ABNORMAL HIGH (ref 65–99)
Potassium: 4.7 mmol/L (ref 3.5–5.1)
SODIUM: 139 mmol/L (ref 135–145)

## 2015-06-09 LAB — URINE CULTURE: CULTURE: NO GROWTH

## 2015-06-09 LAB — GLUCOSE, CAPILLARY: GLUCOSE-CAPILLARY: 95 mg/dL (ref 65–99)

## 2015-06-09 MED ORDER — CARVEDILOL 3.125 MG PO TABS
9.3750 mg | ORAL_TABLET | Freq: Two times a day (BID) | ORAL | Status: DC
  Administered 2015-06-09 – 2015-06-14 (×9): 9.375 mg via ORAL
  Filled 2015-06-09 (×9): qty 3

## 2015-06-09 MED ORDER — SPIRONOLACTONE 25 MG PO TABS
12.5000 mg | ORAL_TABLET | Freq: Every day | ORAL | Status: DC
  Administered 2015-06-09 – 2015-06-11 (×3): 12.5 mg via ORAL
  Filled 2015-06-09 (×5): qty 1

## 2015-06-09 NOTE — Clinical Social Work Note (Signed)
Clinical Social Work Assessment  Patient Details  Name: [name] Macias MRN: [id number] Date of Birth: 1932-07-22  Date of referral:  06/09/15               Reason for consult:  Facility Placement                Permission sought to share information with:    Permission granted to share information::     Name::        Agency::     Relationship::     Contact Information:     Housing/Transportation Living arrangements for the past 2 months:  Single Family Home Source of Information:  Patient, Adult Children, Spouse Patient Interpreter Needed:  None Criminal Activity/Legal Involvement Pertinent to Current Situation/Hospitalization:  No - Comment as needed Significant Relationships:  Spouse, Adult Children Lives with:  Spouse Do you feel safe going back to the place where you live?  Yes Need for family participation in patient care:  Yes (Comment)  Care giving concerns:  Patient has had some HH. Wife also provides assistance.    Social Worker assessment / plan:  Patient's wife and step son, [name], were at  Bedside.  Mrs. Luckett advised that at baseline patient walks with a walker or by holding on to her for assistance.  She stated that he also has a four prong cane.  She stated that he bathes himself.  Mrs. Romito advised that she and patient live in the home together.  Dr. Javier Glazier stated that they have had to take the car keys from patient and that he is going to have to unplug the stove as patient was leaving it on while cooking. Dr. Javier Glazier also discussed patient's has had frequent falls.  CSW discussed the possibility for patient to go to SNF based on the upcoming PT recommendation.  Patient was agreeable and stated that he would go to rehab.  Mrs. Baksh advised that her first pick would be [facility] and her second choice would be [facility]. CSW and family also discussed long term placement options should patient need it.    Employment status:  Retired Programmer, applications PT Recommendations:  Not assessed at this time Information / Referral to community resources:  [place]  Patient/Family's Response to care:  Patient and family are agreeable for patient to go to SNF.   Patient/Family's Understanding of and Emotional Response to Diagnosis, Current Treatment, and Prognosis:  Family is understanding of his diagnosis, treatment and prognosis and are exploring all options.   Emotional Assessment Appearance:  Developmentally appropriate Attitude/Demeanor/Rapport:   (Cooperative) Affect (typically observed):  Calm, Accepting Orientation:  Oriented to Self, Oriented to Place, Oriented to Situation Alcohol / Substance use:  Not Applicable Psych involvement (Current and /or in the community):     Discharge Needs  Concerns to be addressed:  Discharge Planning Concerns Readmission within the last 30 days:  No Current discharge risk:  None Barriers to Discharge:  No Barriers Identified   [name], LCSW 06/09/2015, 1:56 PM

## 2015-06-09 NOTE — Care Management Important Message (Signed)
Important Message  Patient Details  Name: [name] Macias MRN: [id number] Date of Birth: 1932-09-29   Medicare Important Message Given:  Yes    Sherald Barge, RN 06/09/2015, 12:56 PM

## 2015-06-09 NOTE — Progress Notes (Signed)
Daily Progress Note   Patient Name: [name] Macias       Date: 06/09/2015 DOB: December 18, 1932  Age: 82 y.o. MRN#: [id number] Attending Physician: Sinda Du, MD Primary Care Physician: Rozann Lesches, MD Admit Date: 06/07/2015  Reason for Consultation/Follow-up: Disposition, Establishing goals of care and Psychosocial/spiritual support  Subjective: Mr. Boettcher is lying quietly in bed, he makes eye contact and greets me as I enter. At bedside today, are wife Judeth Porch and son Rita Ohara.  Present at this time is Herbalist.  We talk about Mr. Hawkey's rehab potential, and their choices for rehab facilities. We talk about leaning on the facility to help with the discharge planning when/if the time is appropriate.   We talk about Mr. Hensch's chronic health diseases including heart failure. We talk about his labs, with increased troponin, and also heart failure, and heart failure management.   We also talk about healthcare power of attorney and advanced directives. Family shares that they are in agreement with Mr. Masiello wishes to not have extended CPR.  I share the chronic illness trajectory, and both Mrs. Lawrie and John agree that this is been the progression of disease for Mr. Voce. I share that Mr. Whyte told me yesterday that quality was more important to him than quantity. They share his love of being outside and gardening and their concerns about his decreased mobility and functional status.  We discuss reevaluating goals and wishes after every hospitalization. I share that some people choose to do not rehospitalized, let nature take its course. I share that any decision they make out of love will be the right decision for Mr. Lemons.  I share that I will continue to follow them during this  hospitalization.  Length of Stay: 2  Current Medications: Scheduled Meds:  . allopurinol  100 mg Oral Daily  . aspirin  81 mg Oral Daily  . carvedilol  9.375 mg Oral BID WC  . docusate sodium  100 mg Oral BID  . donepezil  10 mg Oral QHS  . enoxaparin (LOVENOX) injection  30 mg Subcutaneous Q24H  . ferrous sulfate  325 mg Oral Q breakfast  . furosemide  40 mg Intravenous Q12H  . isosorbide dinitrate  10 mg Oral BID  . levalbuterol  0.63 mg Nebulization TID  . oxyCODONE-acetaminophen  1 tablet Oral QID  . pantoprazole  40 mg Oral Daily  . polyvinyl alcohol  1 drop Both Eyes TID  . potassium chloride  20 mEq Oral BID  . sertraline  50 mg Oral Daily  . sodium chloride flush  3 mL Intravenous Q12H  . spironolactone  12.5 mg Oral Daily  . temazepam  7.5 mg Oral QHS    Continuous Infusions:    PRN Meds: ALPRAZolam, nitroGLYCERIN  Physical Exam  Constitutional: No distress.  Elderly, frail  HENT:  Head: Normocephalic and atraumatic.  Cardiovascular: Normal rate and regular rhythm.   Pulmonary/Chest: Effort normal. No respiratory distress.  Abdominal: Soft. He exhibits no distension. There is no guarding.  Neurological: He is alert.  Oriented to person and place  Skin: Skin is warm and dry.  Psychiatric: He has a normal mood and affect.  Nursing note and vitals reviewed.           Vital Signs: BP 97/66 mmHg  Pulse 76  Temp(Src) 96.5 F (35.8 C) (Axillary)  Resp 22  Ht 5\' 6"  (1.676 m)  Wt 68.3 kg (150 lb 9.2 oz)  BMI 24.31 kg/m2  SpO2 100% SpO2: SpO2: 100 % O2 Device: O2 Device: Nasal Cannula O2 Flow Rate: O2 Flow Rate (L/min): 2 L/min  Intake/output summary:  Intake/Output Summary (Last 24 hours) at 06/09/15 1135 Last data filed at 06/09/15 [id number]  Gross per 24 hour  Intake    360 ml  Output    525 ml  Net   -165 ml   LBM: Last BM Date: 06/07/15 Baseline Weight: Weight: 68.3 kg (150 lb 9.2 oz) Most recent weight: Weight: 68.3 kg (150 lb 9.2 oz)         Palliative Assessment/Data:    Flowsheet Rows        Most Recent Value   Intake Tab    Referral Department  Pulmonary   Unit at Time of Referral  ICU   Palliative Care Primary Diagnosis  Pulmonary   Date Notified  06/08/15   Palliative Care Type  New Palliative care   Reason for referral  Clarify Goals of Care   Date of Admission  06/07/15   Date first seen by Palliative Care  06/08/15   # of days Palliative referral response time  0 Day(s)   # of days IP prior to Palliative referral  1   Clinical Assessment    Palliative Performance Scale Score  40%   Pain Max last 24 hours  Not able to report   Pain Min Last 24 hours  Not able to report   Dyspnea Max Last 24 Hours  Not able to report   Dyspnea Min Last 24 hours  Not able to report   Psychosocial & Spiritual Assessment    Palliative Care Outcomes    Patient/Family meeting held?  Yes   Who was at the meeting?  patient only, briefly wife on phone   Palliative Care Outcomes  Clarified goals of care, Provided advance care planning, Provided psychosocial or spiritual support   Palliative Care follow-up planned  -- [follow up at [facility]      Patient Active Problem List   Diagnosis Date Noted  . Goals of care, counseling/discussion   . Palliative care encounter   . DNR (do not resuscitate) discussion   . Acute on chronic systolic CHF (congestive heart failure) ([place]) 06/07/2015  . COPD with chronic bronchitis ([place]) 06/07/2015  . Elevated troponin I level 06/07/2015  . Generalized  weakness 06/07/2015  . Multiple falls 06/07/2015  . Dementia 06/07/2015  . Chronic respiratory failure with hypoxia ([place]) 06/07/2015  . CHF (congestive heart failure) ([place]) 03/17/2015  . Acute heart failure ([place]) 03/17/2015  . Acute on chronic systolic heart failure ([place]) 12/10/2014  . Elevated troponin 12/10/2014  . Dementia with behavioral disturbance 10/31/2014  . Hematochezia 10/28/2014  . Essential hypertension 10/28/2014  . Acute blood loss  anemia 10/28/2014  . Stage III chronic kidney disease 10/28/2014  . GI (gastrointestinal bleed) 10/28/2014  . Generalized abdominal cramping 10/28/2014  . Special screening for malignant neoplasms, colon 01/25/2014  . Ascending aortic aneurysm ([place])   . Lung nodule   . NSTEMI (non-ST elevated myocardial infarction) ([place]) 01/24/2014  . Abdominal pain 01/24/2014  . Dyspnea   . Chest pain 12/15/2013  . Depression with anxiety 11/30/2013  . Chronic bronchitis ([place]) 11/30/2013  . GERD (gastroesophageal reflux disease) 06/15/2013  . Dermatomycosis 03/21/2013  . Prostate cancer ([place]) 02/03/2013  . Inguinal hernia 01/17/2013  . Periumbilical hernia [id number]  . Renal cyst, left 01/17/2013  . Medical non-compliance 01/17/2013  . Elevated PSA 08/04/2010  . Prediabetes 08/04/2010  . HTN (hypertension) 08/04/2010  . Chronic systolic heart failure ([place]) 11/28/2009  . Nocturnal hypoxia 09/30/2009  . INSOMNIA 12/20/2008  . GYNECOMASTIA 07/20/2008  . Automatic implantable cardioverter-defibrillator in situ 07/20/2008  . GOUT 05/20/2008  . SHOULDER, ARTHRITIS, DEGEN./OSTEO 11/26/2007  . Nonischemic cardiomyopathy ([place]) 07/20/2007    Palliative Care Assessment & Plan   Patient Profile: Mr. Dalia is an 82 year old male patient with a past medical history inclusive for non-obstructive coronary artery disease, nonischemic cardiomyopathy within EF of 20 to 25% in March 0000000, grade 2 diastolic heart dysfunction, a ICD in situ, diverticular bleed and October 2016, see KD, history of prostate cancer, and mild dementia. He has been living in his own home with his wife (age 82), and while he has been independent with ADLs, he and his wife have assistance with IADLs from her sister Alvester Chou.  He has had several nontraumatic falls at home, he has had abdomen pain that, "hurts all the time", and is noted to have a heart failure exacerbation (BNP 3946) and increased creatinine of 2.6.  Assessment: As  above  Recommendations/Plan: Continue with supportive measures, goal for rehab for strength training and mobility, return to home with more supportive in home services such as [facility], or CAP aid program, if eligible.  Goals of Care and Additional Recommendations:  Limitations on Scope of Treatment: Limited CPR, chest compressions for 3 rounds only. Family in agreement. we talk about intubation/ventilation, no decisions at this time.  Code Status:    Code Status Orders        Start     Ordered   06/07/15 1150  Full code   Continuous     06/07/15 1152    Code Status History    Date Active Date Inactive Code Status Order ID Comments User Context   03/17/2015  5:33 PM 03/20/2015  4:34 PM Full Code [id number]  Nita Sells, MD Inpatient   12/10/2014  6:55 PM 12/13/2014  3:55 PM Full Code [id number]  Truett Mainland, DO Inpatient   10/28/2014  8:43 AM 10/31/2014  3:55 PM Full Code [id number]  Rexene Alberts, MD Inpatient   01/24/2014  7:41 PM 01/25/2014  7:02 PM Full Code [id number]  Brett Canales, PA-C Inpatient       Prognosis:   < 6 months, likely based on decreased functional status,  3 hospitalizations in the last 6 months, chronic disease burden with heart failure.  Discharge Planning:  SNF for rehab if possible, 1st Choice [facility], 2nd choice [place] was discussed with Nursing staff, case manager, social worker, and Dr. Luan Pulling on next rounds.  Thank you for allowing the Palliative Medicine Team to assist in the care of this patient.   Time In: 1205 Time Out: 1240 Total Time 35 minutes Prolonged Time Billed  no       Greater than 50%  of this time was spent counseling and coordinating care related to the above assessment and plan.  Dove,Tasha A, NP  Please contact Palliative Medicine Team phone at [phone] for questions and concerns.

## 2015-06-09 NOTE — NC FL2 (Deleted)
[place] MEDICAID FL2 LEVEL OF CARE SCREENING TOOL     IDENTIFICATION  Patient Name: [name] Macias Birthdate: May 12, 1932 Sex: male Admission Date (Current Location): 06/07/2015  [facility] and [place] Number:  [employer] and Address:  [place] [address] [place]      Provider Number: [id number]  Attending Physician Name and Address:  Sinda Du, MD  Relative Name and Phone Number:       Current Level of Care: Hospital Recommended Level of Care: [place] Prior Approval Number:    Date Approved/Denied:   PASRR Number:  ([id number])  Discharge Plan:      Current Diagnoses: Patient Active Problem List   Diagnosis Date Noted  . Goals of care, counseling/discussion   . Palliative care encounter   . DNR (do not resuscitate) discussion   . Acute on chronic systolic CHF (congestive heart failure) ([place]) 06/07/2015  . COPD with chronic bronchitis ([place]) 06/07/2015  . Elevated troponin I level 06/07/2015  . Generalized weakness 06/07/2015  . Multiple falls 06/07/2015  . Dementia 06/07/2015  . Chronic respiratory failure with hypoxia ([place]) 06/07/2015  . CHF (congestive heart failure) ([place]) 03/17/2015  . Acute heart failure ([place]) 03/17/2015  . Acute on chronic systolic heart failure ([place]) 12/10/2014  . Elevated troponin 12/10/2014  . Dementia with behavioral disturbance 10/31/2014  . Hematochezia 10/28/2014  . Essential hypertension 10/28/2014  . Acute blood loss anemia 10/28/2014  . Stage III chronic kidney disease 10/28/2014  . GI (gastrointestinal bleed) 10/28/2014  . Generalized abdominal cramping 10/28/2014  . Special screening for malignant neoplasms, colon 01/25/2014  . Ascending aortic aneurysm ([place])   . Lung nodule   . NSTEMI (non-ST elevated myocardial infarction) ([place]) 01/24/2014  . Abdominal pain 01/24/2014  . Dyspnea   . Chest pain 12/15/2013  . Depression with anxiety 11/30/2013  . Chronic  bronchitis ([place]) 11/30/2013  . GERD (gastroesophageal reflux disease) 06/15/2013  . Dermatomycosis 03/21/2013  . Prostate cancer ([place]) 02/03/2013  . Inguinal hernia 01/17/2013  . Periumbilical hernia [id number]  . Renal cyst, left 01/17/2013  . Medical non-compliance 01/17/2013  . Elevated PSA 08/04/2010  . Prediabetes 08/04/2010  . HTN (hypertension) 08/04/2010  . Chronic systolic heart failure ([place]) 11/28/2009  . Nocturnal hypoxia 09/30/2009  . INSOMNIA 12/20/2008  . GYNECOMASTIA 07/20/2008  . Automatic implantable cardioverter-defibrillator in situ 07/20/2008  . GOUT 05/20/2008  . SHOULDER, ARTHRITIS, DEGEN./OSTEO 11/26/2007  . Nonischemic cardiomyopathy ([place]) 07/20/2007    Orientation RESPIRATION BLADDER Height & Weight     Self, Situation, Place  O2 (2L) Continent Weight: 150 lb 9.2 oz (68.3 kg) Height:  5\' 6"  (167.6 cm)  BEHAVIORAL SYMPTOMS/MOOD NEUROLOGICAL BOWEL NUTRITION STATUS      Continent Diet (Heart Healthy)  AMBULATORY STATUS COMMUNICATION OF NEEDS Skin   Limited Assist Verbally Normal                       Personal Care Assistance Level of Assistance  Bathing, Dressing Bathing Assistance: Limited assistance   Dressing Assistance: Limited assistance     Functional Limitations Info  Sight, Hearing, Speech Sight Info: Adequate Hearing Info: Adequate Speech Info: Adequate    SPECIAL CARE FACTORS FREQUENCY  PT (By licensed PT)     PT Frequency: eval pending              Contractures      Additional Factors Info  Allergies, Psychotropic   Allergies Info: Codeine, Enalapril Maleate, Sulfonamide  Derivatives,  Psychotropic Info: Xanax         Current Medications (06/09/2015):  This is the current hospital active medication list Current Facility-Administered Medications  Medication Dose Route Frequency Provider Last Rate Last Dose  . allopurinol (ZYLOPRIM) tablet 100 mg  100 mg Oral Daily Rexene Alberts, MD   100 mg at 06/09/15 0930  .  ALPRAZolam Duanne Moron) tablet 0.25 mg  0.25 mg Oral BID PRN Rexene Alberts, MD      . aspirin chewable tablet 81 mg  81 mg Oral Daily Rexene Alberts, MD   81 mg at 06/09/15 0929  . carvedilol (COREG) tablet 9.375 mg  9.375 mg Oral BID WC Satira Sark, MD      . docusate sodium (COLACE) capsule 100 mg  100 mg Oral BID Rexene Alberts, MD   100 mg at 06/09/15 0930  . donepezil (ARICEPT) tablet 10 mg  10 mg Oral QHS Sinda Du, MD   10 mg at 06/08/15 2138  . enoxaparin (LOVENOX) injection 30 mg  30 mg Subcutaneous Q24H Rexene Alberts, MD   30 mg at 06/09/15 1249  . ferrous sulfate tablet 325 mg  325 mg Oral Q breakfast Rexene Alberts, MD   325 mg at 06/09/15 0850  . furosemide (LASIX) injection 40 mg  40 mg Intravenous Q12H Rexene Alberts, MD   40 mg at 06/09/15 [id number]  . isosorbide dinitrate (ISORDIL) tablet 10 mg  10 mg Oral BID Rexene Alberts, MD   10 mg at 06/09/15 0930  . levalbuterol (XOPENEX) nebulizer solution 0.63 mg  0.63 mg Nebulization TID Sinda Du, MD   0.63 mg at 06/09/15 0817  . nitroGLYCERIN (NITROSTAT) SL tablet 0.4 mg  0.4 mg Sublingual Q5 min PRN Rexene Alberts, MD      . oxyCODONE-acetaminophen (PERCOCET/ROXICET) 5-325 MG per tablet 1 tablet  1 tablet Oral QID Rexene Alberts, MD   Stopped at 06/09/15 0932  . pantoprazole (PROTONIX) EC tablet 40 mg  40 mg Oral Daily Rexene Alberts, MD   40 mg at 06/09/15 0930  . polyvinyl alcohol (LIQUIFILM TEARS) 1.4 % ophthalmic solution 1 drop  1 drop Both Eyes TID Rexene Alberts, MD   1 drop at 06/09/15 0932  . potassium chloride SA (K-DUR,KLOR-CON) CR tablet 20 mEq  20 mEq Oral BID Rexene Alberts, MD   20 mEq at 06/09/15 0929  . sertraline (ZOLOFT) tablet 50 mg  50 mg Oral Daily Rexene Alberts, MD   50 mg at 06/09/15 0932  . sodium chloride flush (NS) 0.9 % injection 3 mL  3 mL Intravenous Q12H Rexene Alberts, MD   3 mL at 06/09/15 0932  . spironolactone (ALDACTONE) tablet 12.5 mg  12.5 mg Oral Daily Satira Sark, MD   12.5 mg at 06/09/15 0936   . temazepam (RESTORIL) capsule 7.5 mg  7.5 mg Oral QHS Rexene Alberts, MD   7.5 mg at 06/08/15 2137     Discharge Medications: Please see discharge summary for a list of discharge medications.  Relevant Imaging Results:  Relevant Lab Results:   Additional Information    Dragan Tamburrino, Clydene Pugh, LCSW

## 2015-06-09 NOTE — Progress Notes (Signed)
PT Cancellation Note  Patient Details Name: [name] Macias MRN: [id number] DOB: August 18, 1932   Cancelled Treatment:    Reason Eval/Treat Not Completed: Patient not medically ready. Chart reviewed. Holding PT evaluation at this time. Per [facility] policy, pts with up-trending Tr-I are contraindicated for PT session. No new Tr-I values since yesterday. Pt will need to have demonstrated stable or down-trending troponin values prior to PT eval.      10:24 AM, 06/09/2015 Etta Grandchild, PT, DPT PRN Physical Therapist - [place] License # [id number] [phone] (mobile)

## 2015-06-09 NOTE — Progress Notes (Signed)
Subjective: Patient resting. He is diuresing well. His breathing is better.  Objective: Vital signs in last 24 hours: Temp:  [96 F (35.6 C)-97.9 F (36.6 C)] 97.4 F (36.3 C) (05/26 1200) Pulse Rate:  [26-76] 71 (05/26 1400) Resp:  [11-28] 24 (05/26 1400) BP: (78-104)/(54-82) 99/67 mmHg (05/26 1400) SpO2:  [90 %-100 %] 98 % (05/26 1400) Weight:  [68.3 kg (150 lb 9.2 oz)] 68.3 kg (150 lb 9.2 oz) (05/26 0400) Weight change: 0 kg (0 lb) Last BM Date: 06/07/15  Intake/Output from previous day: 05/25 0701 - 05/26 0700 In: 600 [P.O.:600] Out: 700 [Urine:700]  PHYSICAL EXAM General appearance: alert and no distress Resp: clear to auscultation bilaterally Cardio: S1, S2 normal GI: soft, non-tender; bowel sounds normal; no masses,  no organomegaly Extremities: 2++ leg edema  Lab Results:  Results for orders placed or performed during the hospital encounter of 06/07/15 (from the past 48 hour(s))  Troponin I     Status: Abnormal   Collection Time: 06/07/15  5:25 PM  Result Value Ref Range   Troponin I 0.70 (HH) <0.031 ng/mL    Comment:        POSSIBLE MYOCARDIAL ISCHEMIA. SERIAL TESTING RECOMMENDED. CRITICAL VALUE NOTED.  VALUE IS CONSISTENT WITH PREVIOUSLY REPORTED AND CALLED VALUE.   Troponin I     Status: Abnormal   Collection Time: 06/07/15 11:58 PM  Result Value Ref Range   Troponin I 0.80 (HH) <0.031 ng/mL    Comment:        POSSIBLE MYOCARDIAL ISCHEMIA. SERIAL TESTING RECOMMENDED. CRITICAL VALUE NOTED.  VALUE IS CONSISTENT WITH PREVIOUSLY REPORTED AND CALLED VALUE.   CBC     Status: Abnormal   Collection Time: 06/08/15  5:24 AM  Result Value Ref Range   WBC 7.3 4.0 - 10.5 K/uL   RBC 4.10 (L) 4.22 - 5.81 MIL/uL   Hemoglobin 11.7 (L) 13.0 - 17.0 g/dL   HCT 36.0 (L) 39.0 - 52.0 %   MCV 87.8 78.0 - 100.0 fL   MCH 28.5 26.0 - 34.0 pg   MCHC 32.5 30.0 - 36.0 g/dL   RDW 19.7 (H) 11.5 - 15.5 %   Platelets 170 150 - 400 K/uL  Basic metabolic panel     Status:  Abnormal   Collection Time: 06/08/15  5:24 AM  Result Value Ref Range   Sodium 141 135 - 145 mmol/L   Potassium 4.3 3.5 - 5.1 mmol/L    Comment: DELTA CHECK NOTED   Chloride 106 101 - 111 mmol/L   CO2 26 22 - 32 mmol/L   Glucose, Bld 108 (H) 65 - 99 mg/dL   BUN 61 (H) 6 - 20 mg/dL   Creatinine, Ser 3.06 (H) 0.61 - 1.24 mg/dL   Calcium 9.3 8.9 - 10.3 mg/dL   GFR calc non Af Amer 18 (L) >60 mL/min   GFR calc Af Amer 21 (L) >60 mL/min    Comment: (NOTE) The eGFR has been calculated using the CKD EPI equation. This calculation has not been validated in all clinical situations. eGFR's persistently <60 mL/min signify possible Chronic Kidney Disease.    Anion gap 9 5 - 15  Glucose, capillary     Status: None   Collection Time: 06/08/15  7:54 AM  Result Value Ref Range   Glucose-Capillary 97 65 - 99 mg/dL   Comment 1 Notify RN    Comment 2 Document in Chart   Glucose, capillary     Status: Abnormal   Collection Time: 06/08/15  8:26 PM  Result Value Ref Range   Glucose-Capillary 131 (H) 65 - 99 mg/dL  Basic metabolic panel     Status: Abnormal   Collection Time: 06/09/15  4:45 AM  Result Value Ref Range   Sodium 139 135 - 145 mmol/L   Potassium 4.7 3.5 - 5.1 mmol/L   Chloride 105 101 - 111 mmol/L   CO2 26 22 - 32 mmol/L   Glucose, Bld 106 (H) 65 - 99 mg/dL   BUN 67 (H) 6 - 20 mg/dL   Creatinine, Ser 2.96 (H) 0.61 - 1.24 mg/dL   Calcium 9.2 8.9 - 10.3 mg/dL   GFR calc non Af Amer 18 (L) >60 mL/min   GFR calc Af Amer 21 (L) >60 mL/min    Comment: (NOTE) The eGFR has been calculated using the CKD EPI equation. This calculation has not been validated in all clinical situations. eGFR's persistently <60 mL/min signify possible Chronic Kidney Disease.    Anion gap 8 5 - 15  Glucose, capillary     Status: None   Collection Time: 06/09/15  7:44 AM  Result Value Ref Range   Glucose-Capillary 95 65 - 99 mg/dL    ABGS No results for input(s): PHART, PO2ART, TCO2, HCO3 in the last  72 hours.  Invalid input(s): PCO2 CULTURES Recent Results (from the past 240 hour(s))  Urine culture     Status: None   Collection Time: 06/07/15  8:55 AM  Result Value Ref Range Status   Specimen Description URINE, CATHETERIZED  Final   Special Requests NONE  Final   Culture NO GROWTH Performed at [facility]   Final   Report Status 06/09/2015 FINAL  Final  MRSA PCR Screening     Status: None   Collection Time: 06/07/15  1:55 PM  Result Value Ref Range Status   MRSA by PCR NEGATIVE NEGATIVE Final    Comment:        The GeneXpert MRSA Assay (FDA approved for NASAL specimens only), is one component of a comprehensive MRSA colonization surveillance program. It is not intended to diagnose MRSA infection nor to guide or monitor treatment for MRSA infections.    Studies/Results: No results found.  Medications: I have reviewed the patient's current medications.  Assesment:   Principal Problem:   Acute on chronic systolic CHF (congestive heart failure) (HCC) Active Problems:   Automatic implantable cardioverter-defibrillator in situ   Prediabetes   Essential hypertension   Stage III chronic kidney disease   COPD with chronic bronchitis (HCC)   Elevated troponin I level   Generalized weakness   Multiple falls   Dementia   Chronic respiratory failure with hypoxia (HCC)   Goals of care, counseling/discussion   Palliative care encounter   DNR (do not resuscitate) discussion    Plan:  Medications reviewed  Cardiology recommendation noted Will continue current treatment Will continue current treatment Will monitor cBC     LOS: 2 days   Teagan Heidrick 06/09/2015, 2:40 PM

## 2015-06-09 NOTE — Consult Note (Signed)
Requesting provider: Dr. Rexene Alberts Primary cardiologist: Dr. Cristopher Peru Consulting cardiologist: Dr. Satira Sark  Reason for consultation: Abnormal troponin I  Clinical Summary [name] is a medically complex 82 y.o.male patient of [name] Macias last seen in April of this year. I reviewed the prior office note. He is currently admitted to the hospital with acute on chronic combined heart failure symptoms, also possibly recent bronchitis. He explains that he has had limited appetite, some leg edema as well. Reports compliance with his medications. No chest pain, palpitations, or device shocks.  He has been placed on IV Lasix with mild diuresis overall. Weight is stable around 150 pounds. Blood pressure low normal. He has concurrent chronic kidney disease with creatinine around 3.0 at this time. Troponin I levels have been mildly increased at 0.59, 0.70, and 0.80. This is most likely a reflection of his cardiomyopathy and heart failure rather than representing ACS.  Allergies  Allergen Reactions  . Codeine Hives  . Enalapril Maleate Hives    hives  . Sulfonamide Derivatives Hives    Medications Scheduled Medications: . allopurinol  100 mg Oral Daily  . aspirin  81 mg Oral Daily  . carvedilol  12.5 mg Oral BID WC  . docusate sodium  100 mg Oral BID  . donepezil  10 mg Oral QHS  . enoxaparin (LOVENOX) injection  30 mg Subcutaneous Q24H  . ferrous sulfate  325 mg Oral Q breakfast  . furosemide  40 mg Intravenous Q12H  . isosorbide dinitrate  10 mg Oral BID  . levalbuterol  0.63 mg Nebulization TID  . oxyCODONE-acetaminophen  1 tablet Oral QID  . pantoprazole  40 mg Oral Daily  . polyvinyl alcohol  1 drop Both Eyes TID  . potassium chloride  20 mEq Oral BID  . sertraline  50 mg Oral Daily  . sodium chloride flush  3 mL Intravenous Q12H  . spironolactone  25 mg Oral Daily  . temazepam  7.5 mg Oral QHS    PRN Medications: ALPRAZolam, nitroGLYCERIN   Past Medical  History  Diagnosis Date  . Arthritis   . Gout   . Non-ischemic cardiomyopathy (HCC)     LVEF 10-15%  . Essential hypertension   . CAD (coronary artery disease)     Nonobstructive 2004  . COPD (chronic obstructive pulmonary disease) ([name])   . History of syphilis   . Type 2 HSV infection of penis   . Erectile dysfunction   . Anxiety disorder   . Dementia   . GERD (gastroesophageal reflux disease)   . Ascending aortic aneurysm ([place])   . Lung nodule     RUL nodule noted on abdominal CT  . Automatic implantable cardioverter-defibrillator in situ 07/20/2008  . Chronic systolic heart failure ([place])   . CKD (chronic kidney disease) stage 3, GFR 30-59 ml/min   . Prostate cancer ([place])     Diagnosed in 01/2013 by Dr Michela Macias adenocarcinoma felt to be localized, gleason score 6 with a pSA level of 8.4. No bone scan by Dr Michela Macias, referred to oncology, no radiation treatment offered    . NSTEMI (non-ST elevated myocardial infarction) ([place]) 01/24/2014  . GI (gastrointestinal bleed) 10/28/2014    Secondary to diverticular bleed.    Past Surgical History  Procedure Laterality Date  . Ankle surgery Left   . Insertion of icd  02/19/05  . Crtd upgrade  12/14/2009    [employer]  . Esophagogastroduodenoscopy N/A 07/01/2013    Procedure: ESOPHAGOGASTRODUODENOSCOPY (EGD);  Surgeon: [name] Houston, MD;  [facility]: [facility];  Service: Endoscopy;  Laterality: N/A;  145-moved to 1245 Ann notfied pt  . Esophagogastroduodenoscopy N/A 10/28/2014    Procedure: ESOPHAGOGASTRODUODENOSCOPY (EGD);  Surgeon: [name] Houston, MD;  Location: [facility];  Service: Endoscopy;  Laterality: N/A;  . Colonoscopy N/A 10/29/2014    Procedure: COLONOSCOPY;  Surgeon: [name] Houston, MD;  Location: [facility];  Service: Endoscopy;  Laterality: N/A;    Family History  Problem Relation Age of Onset  . Heart failure Father   . Prostate cancer Brother   . Diabetes Brother   . Hypertension Brother   . Heart failure  Brother     Social History [name] Macias reports that he quit smoking about 52 years ago. His smoking use included Cigarettes. He has a 10 pack-year smoking history. He has never used smokeless tobacco. [name] Macias reports that he does not drink alcohol.  Review of Systems Complete review of systems negative except as otherwise outlined in the clinical summary and also the following. Generalized fatigue. No syncope.  Physical Examination Blood pressure 97/67, pulse 73, temperature 96.5 F (35.8 C), temperature source Axillary, resp. rate 23, height 5\' 6"  (1.676 m), weight 150 lb 9.2 oz (68.3 kg), SpO2 97 %.  Intake/Output Summary (Last 24 hours) at 06/09/15 0918 Last data filed at 06/09/15 0520  Gross per 24 hour  Intake    360 ml  Output    525 ml  Net   -165 ml   Telemetry: Sinus rhythm.  Gen.: Frail-appearing elderly male in no distress. HEENT: Conjunctiva and lids normal, oropharynx clear. Neck: Supple, elevated JVP present, no carotid bruits, no thyromegaly. Lungs: Clear to auscultation with diminished breath sounds, nonlabored breathing at rest. Cardiac: Regular rate and rhythm with ectopy, no S3, 2/6 apical systolic murmur, no pericardial rub. Abdomen: Soft, nontender, bowel sounds present, no guarding or rebound. Extremities: No pitting edema, distal pulses 2+. Skin: Warm and dry. Musculoskeletal: No kyphosis. Neuropsychiatric: Alert and oriented x3, affect grossly appropriate.  Lab Results  Basic Metabolic Panel:  Recent Labs Lab 06/07/15 0753 06/08/15 0524 06/09/15 0445  NA 139 141 139  K 3.3* 4.3 4.7  CL 104 106 105  CO2 24 26 26   GLUCOSE 116* 108* 106*  BUN 53* 61* 67*  CREATININE 2.60* 3.06* 2.96*  CALCIUM 9.4 9.3 9.2    CBC:  Recent Labs Lab 06/07/15 0753 06/08/15 0524  WBC 5.2 7.3  NEUTROABS 3.7  --   HGB 12.0* 11.7*  HCT 37.2* 36.0*  MCV 87.1 87.8  PLT 163 170    Cardiac Enzymes:  Recent Labs Lab 06/07/15 0753 06/07/15 1220  06/07/15 1725 06/07/15 2358  TROPONINI 0.59* 0.59* 0.70* 0.80*    BNP: 3946  ECG Sinus rhythm with atrial run, right bundle branch block, PVC.  Imaging Chest x-ray 06/07/2015: FINDINGS: Bony calvarium is intact. No gross soft tissue abnormality is noted. Mild atrophic changes are again identified. Chronic white matter ischemic change is seen as well. No findings to suggest acute hemorrhage, acute infarction or space-occupying mass lesion are noted.  IMPRESSION: Chronic atrophic and ischemic changes without acute intracranial abnormality.  Echocardiogram 03/18/2015: Study Conclusions  - Left ventricle: The cavity size was severely dilated. Wall  thickness was normal. Systolic function was severely reduced. The  estimated ejection fraction was in the range of 20% to 25%.  Diffuse hypokinesis. There is akinesis of the entireinferior and  inferoseptal myocardium. Features are consistent with a  pseudonormal left ventricular filling pattern, with concomitant  abnormal relaxation and increased filling pressure (grade 2  diastolic dysfunction). - Aortic valve: Trileaflet; mildly thickened, mildly calcified  leaflets. There was moderate regurgitation. - Aorta: Aortic root dimension: 40 mm (ED). - Ascending aorta: The ascending aorta was mildly dilated. - Mitral valve: There was mild to moderate regurgitation. - Left atrium: The atrium was severely dilated. - Right atrium: The atrium was severely dilated. - Tricuspid valve: There was severe regurgitation. - Pulmonary arteries: Systolic pressure was severely increased. PA  peak pressure: 66 mm Hg (S). - Pericardium, extracardiac: A trivial pericardial effusion was  identified posterior to the heart and along the left ventricular  free wall.  Impressions:  - Compared to the prior study, there has been no significant  interval change.  Impression  1. Abnormal troponin I level most consistent with patient's  known history of cardiomyopathy with acute on chronic combined heart failure rather than ACS. He has had no chest pain symptoms.  2. History of nonischemic cardiomyopathy with LVEF 20-25% by echocardiogram in March of this year. He looks to have low output heart failure symptoms, although is probably not a good candidate for advanced heart failure modalities at this point.  3. History of nonobstructive CAD as of 2004.  4. Medtronic ICD in place, followed by [name] Macias with device interrogation from April showing episodes of NSVT, however no sustained events or device shocks.  5. CKD stage III-IV.  Recommendations  Reviewed current medications. Continue IV Lasix for now. Reduce Aldactone to 12.5 mg daily. He is not on ACE inhibitor or ARB with CKD stage III-IV. Reduce Coreg dose to 9.375 mg BID. Continue nitrate. Agree with Palliative care consultation.  Satira Sark, M.D., F.A.C.C.

## 2015-06-10 LAB — GLUCOSE, CAPILLARY: Glucose-Capillary: 119 mg/dL — ABNORMAL HIGH (ref 65–99)

## 2015-06-10 LAB — CBC
HEMATOCRIT: 38.6 % — AB (ref 39.0–52.0)
Hemoglobin: 12.4 g/dL — ABNORMAL LOW (ref 13.0–17.0)
MCH: 28.4 pg (ref 26.0–34.0)
MCHC: 32.1 g/dL (ref 30.0–36.0)
MCV: 88.3 fL (ref 78.0–100.0)
Platelets: 177 10*3/uL (ref 150–400)
RBC: 4.37 MIL/uL (ref 4.22–5.81)
RDW: 19.5 % — ABNORMAL HIGH (ref 11.5–15.5)
WBC: 8.6 10*3/uL (ref 4.0–10.5)

## 2015-06-10 LAB — BASIC METABOLIC PANEL
ANION GAP: 9 (ref 5–15)
BUN: 71 mg/dL — ABNORMAL HIGH (ref 6–20)
CALCIUM: 9.5 mg/dL (ref 8.9–10.3)
CHLORIDE: 106 mmol/L (ref 101–111)
CO2: 23 mmol/L (ref 22–32)
Creatinine, Ser: 2.71 mg/dL — ABNORMAL HIGH (ref 0.61–1.24)
GFR calc Af Amer: 24 mL/min — ABNORMAL LOW (ref >60)
GFR calc non Af Amer: 20 mL/min — ABNORMAL LOW (ref >60)
GLUCOSE: 108 mg/dL — AB (ref 65–99)
POTASSIUM: 5 mmol/L (ref 3.5–5.1)
Sodium: 138 mmol/L (ref 135–145)

## 2015-06-10 NOTE — Progress Notes (Signed)
Subjective: Patient feels better. He is overall  Improving.  Objective: Vital signs in last 24 hours: Temp:  [96.7 F (35.9 C)-97.4 F (36.3 C)] 96.7 F (35.9 C) (05/27 0757) Pulse Rate:  [63-94] 78 (05/27 0900) Resp:  [10-28] 28 (05/27 0900) BP: (92-117)/(66-81) 109/75 mmHg (05/27 0900) SpO2:  [90 %-100 %] 100 % (05/27 0900) Weight:  [66.9 kg (147 lb 7.8 oz)] 66.9 kg (147 lb 7.8 oz) (05/27 0400) Weight change: -1.4 kg (-3 lb 1.4 oz) Last BM Date: 06/07/15  Intake/Output from previous day: 05/26 0701 - 05/27 0700 In: 240 [P.O.:240] Out: 900 [Urine:900]  PHYSICAL EXAM General appearance: alert and no distress Resp: clear to auscultation bilaterally Cardio: S1, S2 normal GI: soft, non-tender; bowel sounds normal; no masses,  no organomegaly Extremities: 2++ leg edema  Lab Results:  Results for orders placed or performed during the hospital encounter of 06/07/15 (from the past 48 hour(s))  Glucose, capillary     Status: Abnormal   Collection Time: 06/08/15  8:26 PM  Result Value Ref Range   Glucose-Capillary 131 (H) 65 - 99 mg/dL  Basic metabolic panel     Status: Abnormal   Collection Time: 06/09/15  4:45 AM  Result Value Ref Range   Sodium 139 135 - 145 mmol/L   Potassium 4.7 3.5 - 5.1 mmol/L   Chloride 105 101 - 111 mmol/L   CO2 26 22 - 32 mmol/L   Glucose, Bld 106 (H) 65 - 99 mg/dL   BUN 67 (H) 6 - 20 mg/dL   Creatinine, Ser 2.96 (H) 0.61 - 1.24 mg/dL   Calcium 9.2 8.9 - 10.3 mg/dL   GFR calc non Af Amer 18 (L) >60 mL/min   GFR calc Af Amer 21 (L) >60 mL/min    Comment: (NOTE) The eGFR has been calculated using the CKD EPI equation. This calculation has not been validated in all clinical situations. eGFR's persistently <60 mL/min signify possible Chronic Kidney Disease.    Anion gap 8 5 - 15  Glucose, capillary     Status: None   Collection Time: 06/09/15  7:44 AM  Result Value Ref Range   Glucose-Capillary 95 65 - 99 mg/dL  Basic metabolic panel      Status: Abnormal   Collection Time: 06/10/15  5:19 AM  Result Value Ref Range   Sodium 138 135 - 145 mmol/L   Potassium 5.0 3.5 - 5.1 mmol/L   Chloride 106 101 - 111 mmol/L   CO2 23 22 - 32 mmol/L   Glucose, Bld 108 (H) 65 - 99 mg/dL   BUN 71 (H) 6 - 20 mg/dL   Creatinine, Ser 2.71 (H) 0.61 - 1.24 mg/dL   Calcium 9.5 8.9 - 10.3 mg/dL   GFR calc non Af Amer 20 (L) >60 mL/min   GFR calc Af Amer 24 (L) >60 mL/min    Comment: (NOTE) The eGFR has been calculated using the CKD EPI equation. This calculation has not been validated in all clinical situations. eGFR's persistently <60 mL/min signify possible Chronic Kidney Disease.    Anion gap 9 5 - 15  CBC     Status: Abnormal   Collection Time: 06/10/15  5:19 AM  Result Value Ref Range   WBC 8.6 4.0 - 10.5 K/uL   RBC 4.37 4.22 - 5.81 MIL/uL   Hemoglobin 12.4 (L) 13.0 - 17.0 g/dL   HCT 38.6 (L) 39.0 - 52.0 %   MCV 88.3 78.0 - 100.0 fL   MCH 28.4 26.0 -  34.0 pg   MCHC 32.1 30.0 - 36.0 g/dL   RDW 19.5 (H) 11.5 - 15.5 %   Platelets 177 150 - 400 K/uL  Glucose, capillary     Status: Abnormal   Collection Time: 06/10/15  7:51 AM  Result Value Ref Range   Glucose-Capillary 119 (H) 65 - 99 mg/dL   Comment 1 Notify RN    Comment 2 Document in Chart     ABGS No results for input(s): PHART, PO2ART, TCO2, HCO3 in the last 72 hours.  Invalid input(s): PCO2 CULTURES Recent Results (from the past 240 hour(s))  Urine culture     Status: None   Collection Time: 06/07/15  8:55 AM  Result Value Ref Range Status   Specimen Description URINE, CATHETERIZED  Final   Special Requests NONE  Final   Culture NO GROWTH Performed at [facility]   Final   Report Status 06/09/2015 FINAL  Final  MRSA PCR Screening     Status: None   Collection Time: 06/07/15  1:55 PM  Result Value Ref Range Status   MRSA by PCR NEGATIVE NEGATIVE Final    Comment:        The GeneXpert MRSA Assay (FDA approved for NASAL specimens only), is one component  of a comprehensive MRSA colonization surveillance program. It is not intended to diagnose MRSA infection nor to guide or monitor treatment for MRSA infections.    Studies/Results: No results found.  Medications: I have reviewed the patient's current medications.  Assesment:   Principal Problem:   Acute on chronic systolic CHF (congestive heart failure) (HCC) Active Problems:   Automatic implantable cardioverter-defibrillator in situ   Prediabetes   Essential hypertension   Stage III chronic kidney disease   COPD with chronic bronchitis (HCC)   Elevated troponin I level   Generalized weakness   Multiple falls   Dementia   Chronic respiratory failure with hypoxia (HCC)   Goals of care, counseling/discussion   Palliative care encounter   DNR (do not resuscitate) discussion    Plan:  Medications reviewed Continue iv diuretics Will monitor BMp      LOS: 3 days   [name] Macias 06/10/2015, 9:23 AM

## 2015-06-11 LAB — BASIC METABOLIC PANEL
Anion gap: 9 (ref 5–15)
Anion gap: 9 (ref 5–15)
BUN: 72 mg/dL — AB (ref 6–20)
BUN: 73 mg/dL — AB (ref 6–20)
CHLORIDE: 104 mmol/L (ref 101–111)
CHLORIDE: 104 mmol/L (ref 101–111)
CO2: 24 mmol/L (ref 22–32)
CO2: 24 mmol/L (ref 22–32)
CREATININE: 2.61 mg/dL — AB (ref 0.61–1.24)
CREATININE: 2.59 mg/dL — AB (ref 0.61–1.24)
Calcium: 9.3 mg/dL (ref 8.9–10.3)
Calcium: 9.4 mg/dL (ref 8.9–10.3)
GFR calc Af Amer: 25 mL/min — ABNORMAL LOW (ref >60)
GFR calc Af Amer: 25 mL/min — ABNORMAL LOW (ref >60)
GFR calc non Af Amer: 21 mL/min — ABNORMAL LOW (ref >60)
GFR calc non Af Amer: 22 mL/min — ABNORMAL LOW (ref >60)
Glucose, Bld: 120 mg/dL — ABNORMAL HIGH (ref 65–99)
Glucose, Bld: 117 mg/dL — ABNORMAL HIGH (ref 65–99)
POTASSIUM: 5 mmol/L (ref 3.5–5.1)
Potassium: 4.5 mmol/L (ref 3.5–5.1)
Sodium: 137 mmol/L (ref 135–145)
Sodium: 137 mmol/L (ref 135–145)

## 2015-06-11 LAB — GLUCOSE, CAPILLARY
Glucose-Capillary: 121 mg/dL — ABNORMAL HIGH (ref 65–99)
Glucose-Capillary: 136 mg/dL — ABNORMAL HIGH (ref 65–99)

## 2015-06-11 MED ORDER — HYDROCOD POLST-CPM POLST ER 10-8 MG/5ML PO SUER: 5.0000 mL | Freq: Two times a day (BID) | ORAL | Status: DC | PRN

## 2015-06-11 NOTE — Progress Notes (Signed)
Subjective: Patient iis progressively improving . He is diuresing well Objective: Vital signs in last 24 hours: Temp:  [96.6 F (35.9 C)-97 F (36.1 C)] 97 F (36.1 C) (05/28 0400) Pulse Rate:  [61-79] 62 (05/28 0800) Resp:  [10-35] 30 (05/28 0800) BP: (88-119)/(60-102) 108/78 mmHg (05/28 0800) SpO2:  [94 %-100 %] 100 % (05/28 0800) Weight:  [67 kg (147 lb 11.3 oz)] 67 kg (147 lb 11.3 oz) (05/28 0500) Weight change: 0.1 kg (3.5 oz) Last BM Date: 06/10/15  Intake/Output from previous day: 05/27 0701 - 05/28 0700 In: 480 [P.O.:480] Out: 1300 [Urine:1300]  PHYSICAL EXAM General appearance: alert and no distress Resp: clear to auscultation bilaterally Cardio: S1, S2 normal GI: soft, non-tender; bowel sounds normal; no masses,  no organomegaly Extremities: 2++ leg edema  Lab Results:  Results for orders placed or performed during the hospital encounter of 06/07/15 (from the past 48 hour(s))  Basic metabolic panel     Status: Abnormal   Collection Time: 06/10/15  5:19 AM  Result Value Ref Range   Sodium 138 135 - 145 mmol/L   Potassium 5.0 3.5 - 5.1 mmol/L   Chloride 106 101 - 111 mmol/L   CO2 23 22 - 32 mmol/L   Glucose, Bld 108 (H) 65 - 99 mg/dL   BUN 71 (H) 6 - 20 mg/dL   Creatinine, Ser 2.71 (H) 0.61 - 1.24 mg/dL   Calcium 9.5 8.9 - 10.3 mg/dL   GFR calc non Af Amer 20 (L) >60 mL/min   GFR calc Af Amer 24 (L) >60 mL/min    Comment: (NOTE) The eGFR has been calculated using the CKD EPI equation. This calculation has not been validated in all clinical situations. eGFR's persistently <60 mL/min signify possible Chronic Kidney Disease.    Anion gap 9 5 - 15  CBC     Status: Abnormal   Collection Time: 06/10/15  5:19 AM  Result Value Ref Range   WBC 8.6 4.0 - 10.5 K/uL   RBC 4.37 4.22 - 5.81 MIL/uL   Hemoglobin 12.4 (L) 13.0 - 17.0 g/dL   HCT 38.6 (L) 39.0 - 52.0 %   MCV 88.3 78.0 - 100.0 fL   MCH 28.4 26.0 - 34.0 pg   MCHC 32.1 30.0 - 36.0 g/dL   RDW 19.5 (H)  11.5 - 15.5 %   Platelets 177 150 - 400 K/uL  Glucose, capillary     Status: Abnormal   Collection Time: 06/10/15  7:51 AM  Result Value Ref Range   Glucose-Capillary 119 (H) 65 - 99 mg/dL   Comment 1 Notify RN    Comment 2 Document in Chart   Basic metabolic panel     Status: Abnormal   Collection Time: 06/11/15 12:28 AM  Result Value Ref Range   Sodium 137 135 - 145 mmol/L   Potassium 4.5 3.5 - 5.1 mmol/L   Chloride 104 101 - 111 mmol/L   CO2 24 22 - 32 mmol/L   Glucose, Bld 120 (H) 65 - 99 mg/dL   BUN 72 (H) 6 - 20 mg/dL   Creatinine, Ser 2.61 (H) 0.61 - 1.24 mg/dL   Calcium 9.3 8.9 - 10.3 mg/dL   GFR calc non Af Amer 21 (L) >60 mL/min   GFR calc Af Amer 25 (L) >60 mL/min    Comment: (NOTE) The eGFR has been calculated using the CKD EPI equation. This calculation has not been validated in all clinical situations. eGFR's persistently <60 mL/min signify possible Chronic Kidney  Disease.    Anion gap 9 5 - 15  Basic metabolic panel     Status: Abnormal   Collection Time: 06/11/15  5:12 AM  Result Value Ref Range   Sodium 137 135 - 145 mmol/L   Potassium 5.0 3.5 - 5.1 mmol/L   Chloride 104 101 - 111 mmol/L   CO2 24 22 - 32 mmol/L   Glucose, Bld 117 (H) 65 - 99 mg/dL   BUN 73 (H) 6 - 20 mg/dL   Creatinine, Ser 2.59 (H) 0.61 - 1.24 mg/dL   Calcium 9.4 8.9 - 10.3 mg/dL   GFR calc non Af Amer 22 (L) >60 mL/min   GFR calc Af Amer 25 (L) >60 mL/min    Comment: (NOTE) The eGFR has been calculated using the CKD EPI equation. This calculation has not been validated in all clinical situations. eGFR's persistently <60 mL/min signify possible Chronic Kidney Disease.    Anion gap 9 5 - 15  Glucose, capillary     Status: Abnormal   Collection Time: 06/11/15  7:31 AM  Result Value Ref Range   Glucose-Capillary 121 (H) 65 - 99 mg/dL   Comment 1 Notify RN    Comment 2 Document in Chart     ABGS No results for input(s): PHART, PO2ART, TCO2, HCO3 in the last 72 hours.  Invalid  input(s): PCO2 CULTURES Recent Results (from the past 240 hour(s))  Urine culture     Status: None   Collection Time: 06/07/15  8:55 AM  Result Value Ref Range Status   Specimen Description URINE, CATHETERIZED  Final   Special Requests NONE  Final   Culture NO GROWTH Performed at [facility]   Final   Report Status 06/09/2015 FINAL  Final  MRSA PCR Screening     Status: None   Collection Time: 06/07/15  1:55 PM  Result Value Ref Range Status   MRSA by PCR NEGATIVE NEGATIVE Final    Comment:        The GeneXpert MRSA Assay (FDA approved for NASAL specimens only), is one component of a comprehensive MRSA colonization surveillance program. It is not intended to diagnose MRSA infection nor to guide or monitor treatment for MRSA infections.    Studies/Results: No results found.  Medications: I have reviewed the patient's current medications.  Assesment:   Principal Problem:   Acute on chronic systolic CHF (congestive heart failure) (HCC) Active Problems:   Automatic implantable cardioverter-defibrillator in situ   Prediabetes   Essential hypertension   Stage III chronic kidney disease   COPD with chronic bronchitis (HCC)   Elevated troponin I level   Generalized weakness   Multiple falls   Dementia   Chronic respiratory failure with hypoxia (HCC)   Goals of care, counseling/discussion   Palliative care encounter   DNR (do not resuscitate) discussion    Plan:  Medications reviewed Continue iv diuretics Will monitor BMp Will transfer to the floor.     LOS: 4 days   Jayceion Lisenby 06/11/2015, 9:19 AM

## 2015-06-11 NOTE — Progress Notes (Signed)
Patient requesting Tussinex for cough, as he takes at home. Dr. Legrand Rams notified. New order for Tussinex BID PRN.

## 2015-06-11 NOTE — Progress Notes (Signed)
Report call to unit 300. No distress noted.

## 2015-06-12 DIAGNOSIS — R531 Weakness: Secondary | ICD-10-CM

## 2015-06-12 LAB — BASIC METABOLIC PANEL
ANION GAP: 9 (ref 5–15)
BUN: 69 mg/dL — AB (ref 6–20)
CHLORIDE: 106 mmol/L (ref 101–111)
CO2: 23 mmol/L (ref 22–32)
Calcium: 9.4 mg/dL (ref 8.9–10.3)
Creatinine, Ser: 2.12 mg/dL — ABNORMAL HIGH (ref 0.61–1.24)
GFR calc Af Amer: 32 mL/min — ABNORMAL LOW (ref >60)
GFR calc non Af Amer: 28 mL/min — ABNORMAL LOW (ref >60)
GLUCOSE: 109 mg/dL — AB (ref 65–99)
POTASSIUM: 5.2 mmol/L — AB (ref 3.5–5.1)
Sodium: 138 mmol/L (ref 135–145)

## 2015-06-12 LAB — GLUCOSE, CAPILLARY
Glucose-Capillary: 100 mg/dL — ABNORMAL HIGH (ref 65–99)
Glucose-Capillary: 133 mg/dL — ABNORMAL HIGH (ref 65–99)

## 2015-06-12 MED ORDER — BISACODYL 10 MG RE SUPP
10.0000 mg | Freq: Every day | RECTAL | Status: DC | PRN
  Administered 2015-06-12: 10 mg via RECTAL
  Filled 2015-06-12: qty 1

## 2015-06-12 NOTE — Progress Notes (Signed)
Daily Progress Note   Patient Name: Rushton Divan       Date: 06/12/2015 DOB: [birth year]  Age: 82 y.o. MRN#: [id number] Attending Physician: Sinda Du, MD Primary Care Physician: Rozann Lesches, MD Admit Date: 06/07/2015  Reason for Consultation/Follow-up: Establishing goals of care, Non pain symptom management and Psychosocial/spiritual support  Subjective: Mr. Nicolosi is sitting Quietly in bed, he makes eye contact and greets me as I enter. There are no family of bedside at this time. He tells me that he is having stomach pains/cramps. I ask about bowel movements and he tells me that he has been several days since he has had a bowel movement. He agrees to try suppository. We discuss continuing with rehab for strength/balance training.  he has no questions at this time.   Talk with nursing staff, they share that Mr. Albro is documented to have had a BM yesterday. Mr. Hauber has continued ongoing debate complaints of stomach paying/cramps. He has a history of diverticulitis.  Length of Stay: 5  Current Medications: Scheduled Meds:  . allopurinol  100 mg Oral Daily  . aspirin  81 mg Oral Daily  . carvedilol  9.375 mg Oral BID WC  . docusate sodium  100 mg Oral BID  . donepezil  10 mg Oral QHS  . enoxaparin (LOVENOX) injection  30 mg Subcutaneous Q24H  . ferrous sulfate  325 mg Oral Q breakfast  . furosemide  40 mg Intravenous Q12H  . isosorbide dinitrate  10 mg Oral BID  . levalbuterol  0.63 mg Nebulization TID  . oxyCODONE-acetaminophen  1 tablet Oral QID  . pantoprazole  40 mg Oral Daily  . polyvinyl alcohol  1 drop Both Eyes TID  . potassium chloride  20 mEq Oral BID  . sertraline  50 mg Oral Daily  . sodium chloride flush  3 mL Intravenous Q12H  . temazepam  7.5 mg Oral QHS     Continuous Infusions:    PRN Meds: ALPRAZolam, bisacodyl, chlorpheniramine-HYDROcodone, nitroGLYCERIN  Physical Exam  Constitutional: No distress.  Cardiovascular: Normal rate and regular rhythm.   Pulmonary/Chest: Effort normal.  Abdominal: Soft. He exhibits no distension. There is guarding.  Neurological: He is alert.  Skin: Skin is warm and dry.  Nursing note and vitals reviewed.  Vital Signs: BP 112/68 mmHg  Pulse 80  Temp(Src) 97.7 F (36.5 C) (Oral)  Resp 16  Ht 5\' 6"  (1.676 m)  Wt 65.6 kg (144 lb 10 oz)  BMI 23.35 kg/m2  SpO2 95% SpO2: SpO2: 95 % O2 Device: O2 Device: Nasal Cannula O2 Flow Rate: O2 Flow Rate (L/min): 2 L/min  Intake/output summary:  Intake/Output Summary (Last 24 hours) at 06/12/15 1147 Last data filed at 06/12/15 1000  Gross per 24 hour  Intake    483 ml  Output   1600 ml  Net  -1117 ml   LBM: Last BM Date: 06/11/15 Baseline Weight: Weight: 68.3 kg (150 lb 9.2 oz) Most recent weight: Weight: 65.6 kg (144 lb 10 oz)       Palliative Assessment/Data:    Flowsheet Rows        Most Recent Value   Intake Tab    Referral Department  Pulmonary   Unit at Time of Referral  ICU   Palliative Care Primary Diagnosis  Pulmonary   Date Notified  06/08/15   Palliative Care Type  New Palliative care   Reason for referral  Clarify Goals of Care   Date of Admission  06/07/15   Date first seen by Palliative Care  06/08/15   # of days Palliative referral response time  0 Day(s)   # of days IP prior to Palliative referral  1   Clinical Assessment    Palliative Performance Scale Score  40%   Pain Max last 24 hours  Not able to report   Pain Min Last 24 hours  Not able to report   Dyspnea Max Last 24 Hours  Not able to report   Dyspnea Min Last 24 hours  Not able to report   Psychosocial & Spiritual Assessment    Palliative Care Outcomes    Patient/Family meeting held?  Yes   Who was at the meeting?  patient only, briefly wife on  phone   Palliative Care Outcomes  Clarified goals of care, Provided advance care planning, Provided psychosocial or spiritual support   Palliative Care follow-up planned  -- [follow up at [facility]      Patient Active Problem List   Diagnosis Date Noted  . Goals of care, counseling/discussion   . Palliative care encounter   . DNR (do not resuscitate) discussion   . Acute on chronic systolic CHF (congestive heart failure) ([place]) 06/07/2015  . COPD with chronic bronchitis ([place]) 06/07/2015  . Elevated troponin I level 06/07/2015  . Generalized weakness 06/07/2015  . Multiple falls 06/07/2015  . Dementia 06/07/2015  . Chronic respiratory failure with hypoxia ([place]) 06/07/2015  . CHF (congestive heart failure) ([place]) 03/17/2015  . Acute heart failure ([place]) 03/17/2015  . Acute on chronic systolic heart failure ([place]) 12/10/2014  . Elevated troponin 12/10/2014  . Dementia with behavioral disturbance 10/31/2014  . Hematochezia 10/28/2014  . Essential hypertension 10/28/2014  . Acute blood loss anemia 10/28/2014  . Stage III chronic kidney disease 10/28/2014  . GI (gastrointestinal bleed) 10/28/2014  . Generalized abdominal cramping 10/28/2014  . Special screening for malignant neoplasms, colon 01/25/2014  . Ascending aortic aneurysm ([place])   . Lung nodule   . NSTEMI (non-ST elevated myocardial infarction) ([place]) 01/24/2014  . Abdominal pain 01/24/2014  . Dyspnea   . Chest pain 12/15/2013  . Depression with anxiety 11/30/2013  . Chronic bronchitis ([place]) 11/30/2013  . GERD (gastroesophageal reflux disease) 06/15/2013  . Dermatomycosis 03/21/2013  . Prostate cancer ([place]) 02/03/2013  .  Inguinal hernia 01/17/2013  . Periumbilical hernia [id number]  . Renal cyst, left 01/17/2013  . Medical non-compliance 01/17/2013  . Elevated PSA 08/04/2010  . Prediabetes 08/04/2010  . HTN (hypertension) 08/04/2010  . Chronic systolic heart failure ([place]) 11/28/2009  . Nocturnal hypoxia 09/30/2009  .  INSOMNIA 12/20/2008  . GYNECOMASTIA 07/20/2008  . Automatic implantable cardioverter-defibrillator in situ 07/20/2008  . GOUT 05/20/2008  . SHOULDER, ARTHRITIS, DEGEN./OSTEO 11/26/2007  . Nonischemic cardiomyopathy ([place]) 07/20/2007    Palliative Care Assessment & Plan   Patient Profile: Mr. Scholler is an 82 year old male patient with a past medical history inclusive for non-obstructive coronary artery disease, nonischemic cardiomyopathy within EF of 20 to 25% in March 0000000, grade 2 diastolic heart dysfunction, a ICD in situ, diverticular bleed and October 2016, see KD, history of prostate cancer, and mild dementia. He has been living in his own home with his wife (age 38), and while he has been independent with ADLs, he and his wife have assistance with IADLs from her sister Alvester Chou. He has had several nontraumatic falls at home, he has had abdomen pain that, "hurts all the time", and is noted to have a heart failure exacerbation (BNP 3946) and increased creatinine of 2.6.  Assessment: As above  Recommendations/Plan: Continue with supportive measures, goal for rehab for strength training and mobility, return to home with more supportive in home services such as [facility], or CAP aid program, if eligible.  Goals of Care and Additional Recommendations:  Limitations on Scope of Treatment: Limited CPR, chest compressions for 3 rounds only. Family in agreement. we talk about intubation/ventilation, no decisions at this time.  Code Status:    Code Status Orders        Start     Ordered   06/07/15 1150  Full code   Continuous     06/07/15 1152    Code Status History    Date Active Date Inactive Code Status Order ID Comments User Context   03/17/2015  5:33 PM 03/20/2015  4:34 PM Full Code [id number]  Nita Sells, MD Inpatient   12/10/2014  6:55 PM 12/13/2014  3:55 PM Full Code [id number]  Truett Mainland, DO Inpatient   10/28/2014  8:43 AM 10/31/2014  3:55 PM Full Code [id number]   Rexene Alberts, MD Inpatient   01/24/2014  7:41 PM 01/25/2014  7:02 PM Full Code [id number]  Brett Canales, PA-C Inpatient       Prognosis:   < 6 months < 6 months, likely based on decreased functional status, 3 hospitalizations in the last 6 months, chronic disease burden with heart failure.  Discharge Planning:  SNF for rehab if possible, 1st Choice [facility], 2nd choice [facility] was discussed with nursing staff, CM, and Dr. Anastasio Champion.   Thank you for allowing the Palliative Medicine Team to assist in the care of this patient.   Time In: 0920 Time Out: 0945 Total Time 25 minutes Prolonged Time Billed  no       Greater than 50%  of this time was spent counseling and coordinating care related to the above assessment and plan.  Dove,Tasha A, NP  Please contact Palliative Medicine Team phone at [phone] for questions and concerns.

## 2015-06-12 NOTE — Progress Notes (Signed)
  [name] Macias [id number] DOB: 08-Jun-1932 DOA: 06/07/2015 PCP: Rozann Lesches, MD   Subjective: He has no major complaints. He wants to go home. He denies any significant dyspnea.           Physical Exam: Blood pressure 112/68, pulse 80, temperature 97.7 F (36.5 C), temperature source Oral, resp. rate 16, height 5\' 6"  (1.676 m), weight 65.6 kg (144 lb 10 oz), SpO2 95 %. He'll systemically well. He is hemodynamically stable. Lung fields are rectally clear. Heart sounds are present without gallop rhythm. There is no peripheral pitting edema.   Investigations:  Recent Results (from the past 240 hour(s))  Urine culture     Status: None   Collection Time: 06/07/15  8:55 AM  Result Value Ref Range Status   Specimen Description URINE, CATHETERIZED  Final   Special Requests NONE  Final   Culture NO GROWTH Performed at [facility]   Final   Report Status 06/09/2015 FINAL  Final  MRSA PCR Screening     Status: None   Collection Time: 06/07/15  1:55 PM  Result Value Ref Range Status   MRSA by PCR NEGATIVE NEGATIVE Final    Comment:        The GeneXpert MRSA Assay (FDA approved for NASAL specimens only), is one component of a comprehensive MRSA colonization surveillance program. It is not intended to diagnose MRSA infection nor to guide or monitor treatment for MRSA infections.      Basic Metabolic Panel:  Recent Labs  06/11/15 0512 06/12/15 0449  NA 137 138  K 5.0 5.2*  CL 104 106  CO2 24 23  GLUCOSE 117* 109*  BUN 73* 69*  CREATININE 2.59* 2.12*  CALCIUM 9.4 9.4   Liver Function Tests: No results for input(s): AST, ALT, ALKPHOS, BILITOT, PROT, ALBUMIN in the last 72 hours.   CBC:  Recent Labs  06/10/15 0519  WBC 8.6  HGB 12.4*  HCT 38.6*  MCV 88.3  PLT 177    No results found.    Medications: I have reviewed the patient's current medications.  Impression: 1. Acute on chronic systolic congestive heart failure. This seems to be  stabilizing and is probably compensated now. 2. COPD. This is stable. 3. Dementia, stable.     Plan: 1. Discontinue Spironolactone  in view of hyperkalemia. I will convert intravenous Lasix and oral Lasix now. Monitor renal function closely. 2. Discharge planning. I appreciate palliative care consultation.  Consultants:  Palliative care.   Procedures:  None.   Antibiotics:  None.                   Code Status: Limited code.    Disposition Plan: Will likely need to go to rehabilitation.  Time spent: 15 minutes.   LOS: 5 days   Lemhi C   06/12/2015, 10:29 AM

## 2015-06-12 NOTE — Progress Notes (Signed)
Pt potassium 5.2. PO potassium due at 2200. MD notified and verbal order to discontinue medication. Oswald Hillock c

## 2015-06-12 NOTE — Progress Notes (Signed)
Patient had 13 beat run of Vtach at 0745 this morning. Vitals stable. Pt alert and oriented, denied pain or palpitations. Pt had a 9 beat run of Vtach during assessment. Notified Dr. Anastasio Champion who is on call for Dr. Luan Pulling. Donavan Foil, RN

## 2015-06-12 NOTE — Care Management Important Message (Signed)
Important Message  Patient Details  Name: [name] Macias MRN: [id number] Date of Birth: 06-Jan-1933   Medicare Important Message Given:  Yes    Alvie Heidelberg, RN 06/12/2015, 9:20 AM

## 2015-06-12 NOTE — Evaluation (Signed)
Physical Therapy Evaluation Patient Details Name: [name] Macias MRN: [id number] DOB: [birth year] Today's Date: 06/12/2015   History of Present Illness  : [name] Macias is a 82 y.o. male with a history of nonobstructive CAD, nonischemic cardiomyopathy with a recent EF of 20-25% per echo [id number], grade 2 diastolic dysfunction, AICD in situ, diverticular bleed in 10/2014, chronic kidney disease, prostate cancer, and mild dementia. He presents to the ED with a chief complaint of generalized weakness. The history is provided by both the patient and his sister-in-law, [name]. Accordingly, the patient was recently treated for a cough and presumed bronchitis by his PCP a couple weeks ago. He was given Z-Pak 2 and cough medicine. Since that time, he has had progressive generalized weakness, poor appetite, and several nontraumatic falls at home. He becomes lightheaded and disoriented at times and falls. He denies outright loss of consciousness. He denies worsening joint pain other than DJD pain he has chronically. He denies focal weakness, difficulty swallowing, or difficulty speaking. He describes shortness of breath at rest when laying flat and with activity. He denies outright chest pain. He has intermittent swelling of his legs, but not more than usual. He denies fever over the past few days. He reports one episode of vomiting yesterday. He says that his abdomen hurts all the time, no worst unusual. His last bowel movement was yesterday and without blood. He denies diarrhea. He denies pain with urination. He says that he has been taking his medications as prescribed without missing.  Clinical Impression  Due to lack of updated lab values of Troponin and pt with noted PVCs on telemetry, eval was limited and symptoms were monitored. Pt in his bed and willing to participate in PT eval upon arrival. He was oriented to person only, but pleasant throughout session. Therapist noting overall weakness with increased time  needed to complete supine to sit with HOB elevated. Noting his sitting balance was good as he was able to maintain for some time without LOB or noted fatigue. He did require ModA to stand with RW, noting poor use of RW and attempting to pull onto himself. Pt was able to take side steps along the edge of his bed with CGA and Moderate cues for correct foot placement. Pt demos poor safety awareness as he crashes onto his bed during stand to sit transfer. RN was in the room at the end of his session and PT relayed info regarding his performance. He would benefit from skilled PT to address his limitations and monitor his functional status as well as level of assistance available at home for appropriate POC development. If pt is currently performing at baseline function, he could benefit from long term living placement to decrease caregiver burden and improve his overall safety.      Follow Up Recommendations Supervision/Assistance - 24 hour;Other (comment) (possible long term placement necessary considering pt's medical/cognitive status)    Equipment Recommendations  None recommended by PT    Recommendations for Other Services       Precautions / Restrictions Restrictions Weight Bearing Restrictions: No      Mobility  Bed Mobility Overal bed mobility: Modified Independent                Transfers Overall transfer level: Needs assistance Equipment used: Rolling walker (2 wheeled) Transfers: Sit to/from Stand Sit to Stand: Mod assist         General transfer comment: unsafe use of RW   Ambulation/Gait Ambulation/Gait assistance: Min guard Ambulation Distance (  Feet): 2 Feet Assistive device: Rolling walker (2 wheeled) Gait Pattern/deviations: Decreased step length - right;Decreased step length - left;Trunk flexed;Wide base of support   Gait velocity interpretation: <1.8 ft/sec, indicative of risk for recurrent falls General Gait Details: Pt taking side steps at EOB and therapist  cues to correctly complete  Stairs            Wheelchair Mobility    Modified Rankin (Stroke Patients Only)       Balance Overall balance assessment: Needs assistance (Overall good(-) sitting; fair standing balance with RW)                                           Pertinent Vitals/Pain Pain Assessment: 0-10 (unable to rate pain reported in his abdomen)    Home Living Family/patient expects to be discharged to:: Private residence (per pt) Living Arrangements: Spouse/significant other Available Help at Discharge: Other (Comment) (unsure of level of assistance available at home) Type of Home: House Home Access: Level entry     Home Layout: One level Home Equipment: Walker - 2 wheels;Cane - single point;Wheelchair - manual Additional Comments: All info provided by pt    Prior Function                 Hand Dominance        Extremity/Trunk Assessment               Lower Extremity Assessment: Generalized weakness         Communication      Cognition Arousal/Alertness: Awake/alert Behavior During Therapy: WFL for tasks assessed/performed Overall Cognitive Status: No family/caregiver present to determine baseline cognitive functioning                      General Comments      Exercises        Assessment/Plan    PT Assessment Patient needs continued PT services  PT Diagnosis Difficulty walking;Abnormality of gait;Generalized weakness   PT Problem List Decreased strength;Decreased activity tolerance;Decreased balance;Decreased safety awareness;Decreased knowledge of use of DME;Decreased mobility  PT Treatment Interventions Gait training;Functional mobility training;Therapeutic activities;Therapeutic exercise;Patient/family education;Balance training   PT Goals (Current goals can be found in the Care Plan section) Acute Rehab PT Goals PT Goal Formulation: Patient unable to participate in goal setting    Frequency  Min 2X/week   Barriers to discharge Decreased caregiver support Unsure of pt's wife's ability to provide necessary care/assistance pt would need.     Co-evaluation               End of Session Equipment Utilized During Treatment: Gait belt Activity Tolerance: Treatment limited secondary to medical complications (Comment) Patient left: in bed;with nursing/sitter in room Nurse Communication: Mobility status         Time: 1220-1250 PT Time Calculation (min) (ACUTE ONLY): 30 min   Charges:   PT Evaluation $PT Eval Moderate Complexity: 1 Procedure PT Treatments $Therapeutic Activity: 23-37 mins   PT G Codes:        1:29 PM,07-07-2015 Elly Modena PT, DPT [facility] [phone]

## 2015-06-12 NOTE — Progress Notes (Signed)
Patient c/o pain to his right side of abdomen this morning. Given percocet as scheduled. Notified Dr. Anastasio Champion of his report of abdominal pain as well. No new orders at this time. Will monitor. Dr. Anastasio Champion stated plans to change Lasix to po. Stated patient to keep foley catheter in place for now. Donavan Foil, RN

## 2015-06-13 LAB — BASIC METABOLIC PANEL
ANION GAP: 7 (ref 5–15)
BUN: 61 mg/dL — ABNORMAL HIGH (ref 6–20)
CALCIUM: 9.5 mg/dL (ref 8.9–10.3)
CO2: 29 mmol/L (ref 22–32)
Chloride: 106 mmol/L (ref 101–111)
Creatinine, Ser: 1.79 mg/dL — ABNORMAL HIGH (ref 0.61–1.24)
GFR, EST AFRICAN AMERICAN: 39 mL/min — AB (ref >60)
GFR, EST NON AFRICAN AMERICAN: 34 mL/min — AB (ref >60)
GLUCOSE: 102 mg/dL — AB (ref 65–99)
POTASSIUM: 4 mmol/L (ref 3.5–5.1)
SODIUM: 142 mmol/L (ref 135–145)

## 2015-06-13 LAB — GLUCOSE, CAPILLARY: GLUCOSE-CAPILLARY: 111 mg/dL — AB (ref 65–99)

## 2015-06-13 MED ORDER — FUROSEMIDE 10 MG/ML IJ SOLN
40.0000 mg | Freq: Three times a day (TID) | INTRAMUSCULAR | Status: DC
  Administered 2015-06-13 – 2015-06-14 (×3): 40 mg via INTRAVENOUS
  Filled 2015-06-13 (×3): qty 4

## 2015-06-13 MED ORDER — ISOSORBIDE DINITRATE 20 MG PO TABS
20.0000 mg | ORAL_TABLET | Freq: Two times a day (BID) | ORAL | Status: DC
  Administered 2015-06-13 – 2015-06-14 (×3): 20 mg via ORAL
  Filled 2015-06-13 (×3): qty 1

## 2015-06-13 NOTE — Progress Notes (Signed)
Daily Progress Note   Patient Name: [name] Macias       Date: 06/13/2015 DOB: [birth year]  Age: 82 y.o. MRN#: [id number] Attending Physician: Sinda Du, MD Primary Care Physician: Rozann Lesches, MD Admit Date: 06/07/2015  Reason for Consultation/Follow-up: Disposition, Establishing goals of care, Non pain symptom management and Psychosocial/spiritual support  Subjective: [name] Macias is resting quietly in bed. He opens his eyes, looks at me and greets me as I enter. He tells me that he continues to have pain in his abdomen, I share that Dr. Luan Pulling has changed some medications that may help with this. He has to empty containers of nutritional supplements on his bedside table and and I assist him in finishing his milk.   I ask how he did with physical therapy yesterday, and he states, "it wore me out". I share that he is scheduled to go to [facility] for rehab. He asks me where he will be going and I restate [facility] in [place]. He asks how long he will be there. I share that we expect him to be there several weeks. He looks disappointed, but he thought I had said 7 weeks.  He agrees for me to call his wife and share information with her.  Call to [facility], Left voicemail with brief update regarding [name] Macias disposition.  Length of Stay: 6  Current Medications: Scheduled Meds:  . allopurinol  100 mg Oral Daily  . aspirin  81 mg Oral Daily  . carvedilol  9.375 mg Oral BID WC  . docusate sodium  100 mg Oral BID  . donepezil  10 mg Oral QHS  . enoxaparin (LOVENOX) injection  30 mg Subcutaneous Q24H  . ferrous sulfate  325 mg Oral Q breakfast  . furosemide  40 mg Intravenous Q8H  . isosorbide dinitrate  20 mg Oral BID  . levalbuterol  0.63 mg Nebulization TID  .  oxyCODONE-acetaminophen  1 tablet Oral QID  . pantoprazole  40 mg Oral Daily  . polyvinyl alcohol  1 drop Both Eyes TID  . sertraline  50 mg Oral Daily  . sodium chloride flush  3 mL Intravenous Q12H  . temazepam  7.5 mg Oral QHS    Continuous Infusions:    PRN Meds: ALPRAZolam, bisacodyl, chlorpheniramine-HYDROcodone, nitroGLYCERIN  Physical Exam  Constitutional: No  distress.  Frail and thin, chronically ill appearing  HENT:  Head: Normocephalic and atraumatic.  Cardiovascular: Normal rate.   Pulmonary/Chest: Effort normal. No respiratory distress.  Abdominal: Soft. He exhibits no distension. There is no guarding.  Neurological: He is alert.  Oriented to person only  Skin: Skin is warm and dry.  Nursing note and vitals reviewed.           Vital Signs: BP 107/68 mmHg  Pulse 85  Temp(Src) 97.7 F (36.5 C) (Oral)  Resp 16  Ht 5\' 6"  (1.676 m)  Wt 63.3 kg (139 lb 8.8 oz)  BMI 22.53 kg/m2  SpO2 98% SpO2: SpO2: 98 % O2 Device: O2 Device: Nasal Cannula O2 Flow Rate: O2 Flow Rate (L/min): 3 L/min  Intake/output summary:  Intake/Output Summary (Last 24 hours) at 06/13/15 1207 Last data filed at 06/13/15 1106  Gross per 24 hour  Intake    963 ml  Output   2000 ml  Net  -1037 ml   LBM: Last BM Date: 06/11/15 Baseline Weight: Weight: 68.3 kg (150 lb 9.2 oz) Most recent weight: Weight: 63.3 kg (139 lb 8.8 oz)       Palliative Assessment/Data:    Flowsheet Rows        Most Recent Value   Intake Tab    Referral Department  Pulmonary   Unit at Time of Referral  ICU   Palliative Care Primary Diagnosis  Pulmonary   Date Notified  06/08/15   Palliative Care Type  New Palliative care   Reason for referral  Clarify Goals of Care   Date of Admission  06/07/15   Date first seen by Palliative Care  06/08/15   # of days Palliative referral response time  0 Day(s)   # of days IP prior to Palliative referral  1   Clinical Assessment    Palliative Performance Scale Score   40%   Pain Max last 24 hours  Not able to report   Pain Min Last 24 hours  Not able to report   Dyspnea Max Last 24 Hours  Not able to report   Dyspnea Min Last 24 hours  Not able to report   Psychosocial & Spiritual Assessment    Palliative Care Outcomes    Patient/Family meeting held?  Yes   Who was at the meeting?  patient only, briefly wife on phone   Palliative Care Outcomes  Clarified goals of care, Provided advance care planning, Provided psychosocial or spiritual support   Palliative Care follow-up planned  -- [follow up at [place]      Patient Active Problem List   Diagnosis Date Noted  . Goals of care, counseling/discussion   . Palliative care encounter   . DNR (do not resuscitate) discussion   . Acute on chronic systolic CHF (congestive heart failure) ([place]) 06/07/2015  . COPD with chronic bronchitis ([place]) 06/07/2015  . Elevated troponin I level 06/07/2015  . Generalized weakness 06/07/2015  . Multiple falls 06/07/2015  . Dementia 06/07/2015  . Chronic respiratory failure with hypoxia ([place]) 06/07/2015  . CHF (congestive heart failure) ([place]) 03/17/2015  . Acute heart failure ([place]) 03/17/2015  . Acute on chronic systolic heart failure ([place]) 12/10/2014  . Elevated troponin 12/10/2014  . Dementia with behavioral disturbance 10/31/2014  . Hematochezia 10/28/2014  . Essential hypertension 10/28/2014  . Acute blood loss anemia 10/28/2014  . Stage III chronic kidney disease 10/28/2014  . GI (gastrointestinal bleed) 10/28/2014  . Generalized abdominal cramping 10/28/2014  .  Special screening for malignant neoplasms, colon 01/25/2014  . Ascending aortic aneurysm ([place])   . Lung nodule   . NSTEMI (non-ST elevated myocardial infarction) ([place]) 01/24/2014  . Abdominal pain 01/24/2014  . Dyspnea   . Chest pain 12/15/2013  . Depression with anxiety 11/30/2013  . Chronic bronchitis ([place]) 11/30/2013  . GERD (gastroesophageal reflux disease) 06/15/2013  . Dermatomycosis 03/21/2013    . Prostate cancer ([place]) 02/03/2013  . Inguinal hernia 01/17/2013  . Periumbilical hernia [id number]  . Renal cyst, left 01/17/2013  . Medical non-compliance 01/17/2013  . Elevated PSA 08/04/2010  . Prediabetes 08/04/2010  . HTN (hypertension) 08/04/2010  . Chronic systolic heart failure ([place]) 11/28/2009  . Nocturnal hypoxia 09/30/2009  . INSOMNIA 12/20/2008  . GYNECOMASTIA 07/20/2008  . Automatic implantable cardioverter-defibrillator in situ 07/20/2008  . GOUT 05/20/2008  . SHOULDER, ARTHRITIS, DEGEN./OSTEO 11/26/2007  . Nonischemic cardiomyopathy ([place]) 07/20/2007    Palliative Care Assessment & Plan   Patient Profile: Mr. Smiling is an 82 year old male patient with a past medical history inclusive for non-obstructive coronary artery disease, nonischemic cardiomyopathy within EF of 20 to 25% in March 0000000, grade 2 diastolic heart dysfunction, a ICD in situ, diverticular bleed and October 2016, see KD, history of prostate cancer, and mild dementia. He has been living in his own home with his wife (age 34), and while he has been independent with ADLs, he and his wife have assistance with IADLs from her sister Alvester Chou. He has had several nontraumatic falls at home, he has had abdomen pain that, "hurts all the time", and is noted to have a heart failure exacerbation (BNP 3946) and increased creatinine of 2.6.  Assessment: As above  Recommendations/Plan: Continue with supportive measures, goal for rehab for strength training and mobility, return to home with more supportive in home services such as [facility], or CAP aid program, if eligible.  Goals of Care and Additional Recommendations:  Limitations on Scope of Treatment: Limited CPR, chest compressions for 3 rounds only. Family in agreement. we talk about intubation/ventilation, no decisions at this time.  Code Status:    Code Status Orders        Start     Ordered   06/07/15 1150  Full code   Continuous     06/07/15 1152     Code Status History    Date Active Date Inactive Code Status Order ID Comments User Context   03/17/2015  5:33 PM 03/20/2015  4:34 PM Full Code [id number]  Nita Sells, MD Inpatient   12/10/2014  6:55 PM 12/13/2014  3:55 PM Full Code [id number]  Truett Mainland, DO Inpatient   10/28/2014  8:43 AM 10/31/2014  3:55 PM Full Code [id number]  Rexene Alberts, MD Inpatient   01/24/2014  7:41 PM 01/25/2014  7:02 PM Full Code [id number]  Brett Canales, PA-C Inpatient       Prognosis:   < 6 months, likely based on decreased functional status, 3 hospitalizations in the last 6 months, chronic disease burden with heart failure.  Discharge Planning:  [facility] for rehab with Palliative care service follow-up.  [facility] [place] plan was discussed with Nursing staff, case manager, social worker, and Dr. Luan Pulling on next rounds.  Thank you for allowing the Palliative Medicine Team to assist in the care of this patient.   Time In: 0925 Time Out: 0100 Total Time 35 minutes Prolonged Time Billed  no       Greater than 50%  of this  time was spent counseling and coordinating care related to the above assessment and plan.  Regginald Pask A, NP  Please contact Palliative Medicine Team phone at [phone] for questions and concerns.

## 2015-06-13 NOTE — Progress Notes (Signed)
Patient ID: [name] Macias, male   DOB: 1932/02/21, 82 y.o.   MRN: [id number]     Subjective:    + SOB. Some RUQ pain this AM  Objective:   Temp:  [97.5 F (36.4 C)-97.7 F (36.5 C)] 97.7 F (36.5 C) (05/30 0657) Pulse Rate:  [68-85] 85 (05/30 0657) Resp:  [16] 16 (05/30 0657) BP: (90-107)/(57-68) 107/68 mmHg (05/30 0657) SpO2:  [95 %-99 %] 98 % (05/30 0754) Weight:  [139 lb 8.8 oz (63.3 kg)] 139 lb 8.8 oz (63.3 kg) (05/30 0726) Last BM Date: 06/11/15  Filed Weights   06/11/15 0500 06/12/15 0647 06/13/15 0726  Weight: 147 lb 11.3 oz (67 kg) 144 lb 10 oz (65.6 kg) 139 lb 8.8 oz (63.3 kg)    Intake/Output Summary (Last 24 hours) at 06/13/15 0834 Last data filed at 06/13/15 0531  Gross per 24 hour  Intake    723 ml  Output   2000 ml  Net  -1277 ml    Telemetry:SR, occasional PVCs  Exam:  General: NAD  HEENT: sclera clear, throat clear  Resp: crackles bilateral bases  Cardiac: RRR, no m/r/g, JVD just below angle of jaw  GI: abdmen soft,mild RUQ tenderness to palpation  MSK: trace bilateral edema  Neuro: no focal deficits  Psych: appropriate affect  Lab Results:  Basic Metabolic Panel:  Recent Labs Lab 06/11/15 0512 06/12/15 0449 06/13/15 0642  NA 137 138 142  K 5.0 5.2* 4.0  CL 104 106 106  CO2 24 23 29   GLUCOSE 117* 109* 102*  BUN 73* 69* 61*  CREATININE 2.59* 2.12* 1.79*  CALCIUM 9.4 9.4 9.5    Liver Function Tests: No results for input(s): AST, ALT, ALKPHOS, BILITOT, PROT, ALBUMIN in the last 168 hours.  CBC:  Recent Labs Lab 06/07/15 0753 06/08/15 0524 06/10/15 0519  WBC 5.2 7.3 8.6  HGB 12.0* 11.7* 12.4*  HCT 37.2* 36.0* 38.6*  MCV 87.1 87.8 88.3  PLT 163 170 177    Cardiac Enzymes:  Recent Labs Lab 06/07/15 1220 06/07/15 1725 06/07/15 2358  TROPONINI 0.59* 0.70* 0.80*    BNP: No results for input(s): PROBNP in the last 8760 hours.  Coagulation:  Recent Labs Lab 06/07/15 1220  INR 1.47    ECG:   Medications:    Scheduled Medications: . allopurinol  100 mg Oral Daily  . aspirin  81 mg Oral Daily  . carvedilol  9.375 mg Oral BID WC  . docusate sodium  100 mg Oral BID  . donepezil  10 mg Oral QHS  . enoxaparin (LOVENOX) injection  30 mg Subcutaneous Q24H  . ferrous sulfate  325 mg Oral Q breakfast  . furosemide  40 mg Intravenous Q12H  . isosorbide dinitrate  10 mg Oral BID  . levalbuterol  0.63 mg Nebulization TID  . oxyCODONE-acetaminophen  1 tablet Oral QID  . pantoprazole  40 mg Oral Daily  . polyvinyl alcohol  1 drop Both Eyes TID  . sertraline  50 mg Oral Daily  . sodium chloride flush  3 mL Intravenous Q12H  . temazepam  7.5 mg Oral QHS     Infusions:     PRN Medications:  ALPRAZolam, bisacodyl, chlorpheniramine-HYDROcodone, nitroGLYCERIN     Assessment/Plan    1. Acute on chronic systolic HF - [id number] echo LVEF 20-25%, diffuse hypokinesis, grade II diastolic dysfunction, mod AI, mild to mod MR, PASP 66 - negative 1 liter yesterday, negative 3.7 liters since admission. He is on lasix 40mg  IV bid. Downtrend  in Cr and BUN consistent with venous congestion and CHF. Continue IV diuretics today - other medical therapy with coreg. No ACE/ARB/Aldactone given poor renal function.  - still with some SOB this AM. Some tenderness in RUQ that may be related to hepatic congestion - increase lasix to 40mg  IV tid. Agree with palliative care involvement, overall advanced heart failure poor candidate for advanced therapies. Hopefully with further diuresis symptoms will improve in the short term, long term prognosis overall remains poor.         Carlyle Dolly, M.D.,

## 2015-06-13 NOTE — Clinical Social Work Note (Signed)
CSW spoke with Sharyn Lull at [facility] and requested that she review patient for SNF PT.  Sharyn Lull stated that she would review patient's information and contact CSW with authorization status.  Dashay Giesler, Clydene Pugh, LCSW

## 2015-06-13 NOTE — NC FL2 (Signed)
[place] MEDICAID FL2 LEVEL OF CARE SCREENING TOOL     IDENTIFICATION  Patient Name: [name] Macias Birthdate: May 09, 1932 Sex: male Admission Date (Current Location): 06/07/2015  [facility] and [place] Number:  [employer] and Address:  [facility] [address], [place]      Provider Number: [id number]  Attending Physician Name and Address:  Sinda Du, MD  Relative Name and Phone Number:       Current Level of Care: Hospital Recommended Level of Care: [place] Prior Approval Number:    Date Approved/Denied:   PASRR Number:  ([id number])  Discharge Plan:      Current Diagnoses: Patient Active Problem List   Diagnosis Date Noted  . Goals of care, counseling/discussion   . Palliative care encounter   . DNR (do not resuscitate) discussion   . Acute on chronic systolic CHF (congestive heart failure) ([place]) 06/07/2015  . COPD with chronic bronchitis ([place]) 06/07/2015  . Elevated troponin I level 06/07/2015  . Generalized weakness 06/07/2015  . Multiple falls 06/07/2015  . Dementia 06/07/2015  . Chronic respiratory failure with hypoxia ([place]) 06/07/2015  . CHF (congestive heart failure) ([place]) 03/17/2015  . Acute heart failure ([place]) 03/17/2015  . Acute on chronic systolic heart failure ([place]) 12/10/2014  . Elevated troponin 12/10/2014  . Dementia with behavioral disturbance 10/31/2014  . Hematochezia 10/28/2014  . Essential hypertension 10/28/2014  . Acute blood loss anemia 10/28/2014  . Stage III chronic kidney disease 10/28/2014  . GI (gastrointestinal bleed) 10/28/2014  . Generalized abdominal cramping 10/28/2014  . Special screening for malignant neoplasms, colon 01/25/2014  . Ascending aortic aneurysm ([place])   . Lung nodule   . NSTEMI (non-ST elevated myocardial infarction) ([place]) 01/24/2014  . Abdominal pain 01/24/2014  . Dyspnea   . Chest pain 12/15/2013  . Depression with anxiety 11/30/2013  . Chronic  bronchitis ([place]) 11/30/2013  . GERD (gastroesophageal reflux disease) 06/15/2013  . Dermatomycosis 03/21/2013  . Prostate cancer ([place]) 02/03/2013  . Inguinal hernia 01/17/2013  . Periumbilical hernia [id number]  . Renal cyst, left 01/17/2013  . Medical non-compliance 01/17/2013  . Elevated PSA 08/04/2010  . Prediabetes 08/04/2010  . HTN (hypertension) 08/04/2010  . Chronic systolic heart failure ([place]) 11/28/2009  . Nocturnal hypoxia 09/30/2009  . INSOMNIA 12/20/2008  . GYNECOMASTIA 07/20/2008  . Automatic implantable cardioverter-defibrillator in situ 07/20/2008  . GOUT 05/20/2008  . SHOULDER, ARTHRITIS, DEGEN./OSTEO 11/26/2007  . Nonischemic cardiomyopathy ([place]) 07/20/2007    Orientation RESPIRATION BLADDER Height & Weight     Self, Situation, Place  O2 (2L) Continent Weight: 139 lb 8.8 oz (63.3 kg) Height:  5\' 6"  (167.6 cm)  BEHAVIORAL SYMPTOMS/MOOD NEUROLOGICAL BOWEL NUTRITION STATUS      Continent Diet (Heart Healthy)  AMBULATORY STATUS COMMUNICATION OF NEEDS Skin   Limited Assist Verbally Normal                       Personal Care Assistance Level of Assistance  Bathing, Dressing Bathing Assistance: Limited assistance   Dressing Assistance: Limited assistance     Functional Limitations Info  Sight, Hearing, Speech Sight Info: Adequate Hearing Info: Adequate Speech Info: Adequate    SPECIAL CARE FACTORS FREQUENCY  PT (By licensed PT)     PT Frequency: eval pending              Contractures      Additional Factors Info  Allergies, Psychotropic   Allergies Info: Codeine, Enalapril Maleate, Sulfonamide  Derivatives,  Psychotropic Info: Xanax         Current Medications (06/13/2015):  This is the current hospital active medication list Current Facility-Administered Medications  Medication Dose Route Frequency Provider Last Rate Last Dose  . allopurinol (ZYLOPRIM) tablet 100 mg  100 mg Oral Daily Rexene Alberts, MD   100 mg at 06/13/15 1105  .  ALPRAZolam Duanne Moron) tablet 0.25 mg  0.25 mg Oral BID PRN Rexene Alberts, MD      . aspirin chewable tablet 81 mg  81 mg Oral Daily Rexene Alberts, MD   81 mg at 06/13/15 1105  . bisacodyl (DULCOLAX) suppository 10 mg  10 mg Rectal Daily PRN Drue Novel, NP   10 mg at 06/12/15 1253  . carvedilol (COREG) tablet 9.375 mg  9.375 mg Oral BID WC Satira Sark, MD   9.375 mg at 06/13/15 1104  . chlorpheniramine-HYDROcodone (TUSSIONEX) 10-8 MG/5ML suspension 5 mL  5 mL Oral BID PRN Rosita Fire, MD      . docusate sodium (COLACE) capsule 100 mg  100 mg Oral BID Rexene Alberts, MD   100 mg at 06/13/15 1105  . donepezil (ARICEPT) tablet 10 mg  10 mg Oral QHS Sinda Du, MD   10 mg at 06/12/15 2049  . enoxaparin (LOVENOX) injection 30 mg  30 mg Subcutaneous Q24H Rexene Alberts, MD   30 mg at 06/13/15 1105  . ferrous sulfate tablet 325 mg  325 mg Oral Q breakfast Rexene Alberts, MD   325 mg at 06/13/15 1105  . furosemide (LASIX) injection 40 mg  40 mg Intravenous Q8H Arnoldo Lenis, MD      . isosorbide dinitrate (ISORDIL) tablet 20 mg  20 mg Oral BID Sinda Du, MD   20 mg at 06/13/15 1105  . levalbuterol (XOPENEX) nebulizer solution 0.63 mg  0.63 mg Nebulization TID Sinda Du, MD   0.63 mg at 06/13/15 0752  . nitroGLYCERIN (NITROSTAT) SL tablet 0.4 mg  0.4 mg Sublingual Q5 min PRN Rexene Alberts, MD      . oxyCODONE-acetaminophen (PERCOCET/ROXICET) 5-325 MG per tablet 1 tablet  1 tablet Oral QID Rexene Alberts, MD   1 tablet at 06/13/15 1105  . pantoprazole (PROTONIX) EC tablet 40 mg  40 mg Oral Daily Rexene Alberts, MD   40 mg at 06/13/15 1105  . polyvinyl alcohol (LIQUIFILM TEARS) 1.4 % ophthalmic solution 1 drop  1 drop Both Eyes TID Rexene Alberts, MD   1 drop at 06/12/15 2056  . sertraline (ZOLOFT) tablet 50 mg  50 mg Oral Daily Rexene Alberts, MD   50 mg at 06/13/15 1105  . sodium chloride flush (NS) 0.9 % injection 3 mL  3 mL Intravenous Q12H Rexene Alberts, MD   3 mL at 06/13/15 1106  .  temazepam (RESTORIL) capsule 7.5 mg  7.5 mg Oral QHS Rexene Alberts, MD   7.5 mg at 06/12/15 2049     Discharge Medications: Please see discharge summary for a list of discharge medications.  Relevant Imaging Results:  Relevant Lab Results:   Additional Information    Dalilah Curlin, Clydene Pugh, LCSW

## 2015-06-13 NOTE — Progress Notes (Signed)
Subjective: He continues to complain of abdominal discomfort. This has been diagnosed as mesenteric ischemia based on his congestive heart failure and ischemic heart disease. He had initially improved with the use of long-acting nitrates. He says he feels a little better. He has no new complaints. His renal function has improved.  Objective: Vital signs in last 24 hours: Temp:  [97.5 F (36.4 C)-97.7 F (36.5 C)] 97.7 F (36.5 C) (05/30 0657) Pulse Rate:  [68-85] 85 (05/30 0657) Resp:  [16] 16 (05/30 0657) BP: (90-107)/(57-68) 107/68 mmHg (05/30 0657) SpO2:  [95 %-99 %] 98 % (05/30 0754) Weight:  [63.3 kg (139 lb 8.8 oz)] 63.3 kg (139 lb 8.8 oz) (05/30 0726) Weight change:  Last BM Date: 06/11/15  Intake/Output from previous day: 05/29 0701 - 05/30 0700 In: 963 [P.O.:960; I.V.:3] Out: 2000 [Urine:2000]  PHYSICAL EXAM General appearance: alert, cooperative and mild distress Resp: clear to auscultation bilaterally Cardio: regular rate and rhythm, S1, S2 normal, no murmur, click, rub or gallop GI: Mildly diffusely tender Extremities: extremities normal, atraumatic, no cyanosis or edema  Lab Results:  Results for orders placed or performed during the hospital encounter of 06/07/15 (from the past 48 hour(s))  Glucose, capillary     Status: Abnormal   Collection Time: 06/11/15  8:25 PM  Result Value Ref Range   Glucose-Capillary 136 (H) 65 - 99 mg/dL   Comment 1 Notify RN    Comment 2 Document in Chart   Basic metabolic panel     Status: Abnormal   Collection Time: 06/12/15  4:49 AM  Result Value Ref Range   Sodium 138 135 - 145 mmol/L   Potassium 5.2 (H) 3.5 - 5.1 mmol/L   Chloride 106 101 - 111 mmol/L   CO2 23 22 - 32 mmol/L   Glucose, Bld 109 (H) 65 - 99 mg/dL   BUN 69 (H) 6 - 20 mg/dL   Creatinine, Ser 2.12 (H) 0.61 - 1.24 mg/dL   Calcium 9.4 8.9 - 10.3 mg/dL   GFR calc non Af Amer 28 (L) >60 mL/min   GFR calc Af Amer 32 (L) >60 mL/min    Comment: (NOTE) The eGFR has  been calculated using the CKD EPI equation. This calculation has not been validated in all clinical situations. eGFR's persistently <60 mL/min signify possible Chronic Kidney Disease.    Anion gap 9 5 - 15  Glucose, capillary     Status: Abnormal   Collection Time: 06/12/15  7:59 AM  Result Value Ref Range   Glucose-Capillary 100 (H) 65 - 99 mg/dL  Glucose, capillary     Status: Abnormal   Collection Time: 06/12/15  9:08 PM  Result Value Ref Range   Glucose-Capillary 133 (H) 65 - 99 mg/dL  Basic metabolic panel     Status: Abnormal   Collection Time: 06/13/15  6:42 AM  Result Value Ref Range   Sodium 142 135 - 145 mmol/L   Potassium 4.0 3.5 - 5.1 mmol/L    Comment: DELTA CHECK NOTED   Chloride 106 101 - 111 mmol/L   CO2 29 22 - 32 mmol/L   Glucose, Bld 102 (H) 65 - 99 mg/dL   BUN 61 (H) 6 - 20 mg/dL   Creatinine, Ser 1.79 (H) 0.61 - 1.24 mg/dL   Calcium 9.5 8.9 - 10.3 mg/dL   GFR calc non Af Amer 34 (L) >60 mL/min   GFR calc Af Amer 39 (L) >60 mL/min    Comment: (NOTE) The eGFR has been  calculated using the CKD EPI equation. This calculation has not been validated in all clinical situations. eGFR's persistently <60 mL/min signify possible Chronic Kidney Disease.    Anion gap 7 5 - 15  Glucose, capillary     Status: Abnormal   Collection Time: 06/13/15  7:43 AM  Result Value Ref Range   Glucose-Capillary 111 (H) 65 - 99 mg/dL   Comment 1 Notify RN     ABGS No results for input(s): PHART, PO2ART, TCO2, HCO3 in the last 72 hours.  Invalid input(s): PCO2 CULTURES Recent Results (from the past 240 hour(s))  Urine culture     Status: None   Collection Time: 06/07/15  8:55 AM  Result Value Ref Range Status   Specimen Description URINE, CATHETERIZED  Final   Special Requests NONE  Final   Culture NO GROWTH Performed at [facility]   Final   Report Status 06/09/2015 FINAL  Final  MRSA PCR Screening     Status: None   Collection Time: 06/07/15  1:55 PM  Result  Value Ref Range Status   MRSA by PCR NEGATIVE NEGATIVE Final    Comment:        The GeneXpert MRSA Assay (FDA approved for NASAL specimens only), is one component of a comprehensive MRSA colonization surveillance program. It is not intended to diagnose MRSA infection nor to guide or monitor treatment for MRSA infections.    Studies/Results: No results found.  Medications:  Prior to Admission:  Prescriptions prior to admission  Medication Sig Dispense Refill Last Dose  . acetaminophen (TYLENOL) 500 MG tablet Take 1,000 mg by mouth daily as needed for moderate pain.    Past Week at Unknown time  . allopurinol (ZYLOPRIM) 100 MG tablet Take 1 tablet (100 mg total) by mouth daily. 90 tablet 1 06/06/2015 at Unknown time  . ALPRAZolam (XANAX) 0.25 MG tablet Take 0.25 mg by mouth 2 (two) times daily as needed for anxiety.   2 06/06/2015 at Unknown time  . carvedilol (COREG) 25 MG tablet Take 25 mg by mouth 2 (two) times daily with a meal.   06/06/2015 at Unknown time  . donepezil (ARICEPT) 5 MG tablet Take 1 tablet (5 mg total) by mouth at bedtime. 30 tablet 12 06/06/2015 at Unknown time  . ferrous sulfate (FERROUSUL) 325 (65 FE) MG tablet Take 1 tablet (325 mg total) by mouth daily with breakfast. 30 tablet 3 06/06/2015 at Unknown time  . furosemide (LASIX) 40 MG tablet Take 1 tablet (40 mg total) by mouth 2 (two) times daily. 180 tablet 1 06/06/2015 at Unknown time  . HYDROcodone-acetaminophen (NORCO) 10-325 MG tablet Take 1 tablet by mouth every 6 (six) hours as needed for moderate pain.   06/06/2015 at Unknown time  . isosorbide dinitrate (ISORDIL) 10 MG tablet Take 10 mg by mouth 2 (two) times daily.    06/06/2015 at Unknown time  . losartan (COZAAR) 25 MG tablet Take 1 tablet (25 mg total) by mouth daily. 90 tablet 1 06/06/2015 at Unknown time  . nitroGLYCERIN (NITROSTAT) 0.4 MG SL tablet Place 1 tablet (0.4 mg total) under the tongue every 5 (five) minutes as needed for chest pain. For chest  pain 30 tablet 6 unknown  . oxyCODONE-acetaminophen (PERCOCET/ROXICET) 5-325 MG tablet Take 1 tablet by mouth 4 (four) times daily. Reported on 05/03/2015   Past Week at Unknown time  . OXYGEN Inhale 2 L into the lungs at bedtime. May use daily as needed   continuous  .  pantoprazole (PROTONIX) 40 MG tablet Take 40 mg by mouth daily. Reported on 04/06/2015   06/06/2015 at Unknown time  . sertraline (ZOLOFT) 50 MG tablet Take 50 mg by mouth daily.  5 06/06/2015 at Unknown time  . spironolactone (ALDACTONE) 25 MG tablet Take 1 tablet (25 mg total) by mouth daily. 90 tablet 1 06/06/2015 at Unknown time  . temazepam (RESTORIL) 30 MG capsule Take 30 mg by mouth at bedtime.   06/06/2015 at Unknown time   Scheduled: . allopurinol  100 mg Oral Daily  . aspirin  81 mg Oral Daily  . carvedilol  9.375 mg Oral BID WC  . docusate sodium  100 mg Oral BID  . donepezil  10 mg Oral QHS  . enoxaparin (LOVENOX) injection  30 mg Subcutaneous Q24H  . ferrous sulfate  325 mg Oral Q breakfast  . furosemide  40 mg Intravenous Q12H  . isosorbide dinitrate  10 mg Oral BID  . levalbuterol  0.63 mg Nebulization TID  . oxyCODONE-acetaminophen  1 tablet Oral QID  . pantoprazole  40 mg Oral Daily  . polyvinyl alcohol  1 drop Both Eyes TID  . sertraline  50 mg Oral Daily  . sodium chloride flush  3 mL Intravenous Q12H  . temazepam  7.5 mg Oral QHS   Continuous:  PVV:ZSMOLMBEML, bisacodyl, chlorpheniramine-HYDROcodone, nitroGLYCERIN  Assesment: He has acute on chronic heart failure. He has acute on chronic kidney disease and that is better. He has COPD and chronic bronchitis. He has presumed mesenteric ischemia. He is still having trouble with that. He has mild dementia at baseline. Principal Problem:   Acute on chronic systolic CHF (congestive heart failure) (HCC) Active Problems:   Automatic implantable cardioverter-defibrillator in situ   Prediabetes   Essential hypertension   Stage III chronic kidney disease   COPD  with chronic bronchitis (HCC)   Elevated troponin I level   Generalized weakness   Multiple falls   Dementia   Chronic respiratory failure with hypoxia (HCC)   Goals of care, counseling/discussion   Palliative care encounter   DNR (do not resuscitate) discussion    Plan: Increase his nitrates. Continue his other treatments.    LOS: 6 days   Garreth Burnsworth L 06/13/2015, 8:40 AM

## 2015-06-14 LAB — BASIC METABOLIC PANEL
ANION GAP: 8 (ref 5–15)
BUN: 62 mg/dL — ABNORMAL HIGH (ref 6–20)
CHLORIDE: 102 mmol/L (ref 101–111)
CO2: 31 mmol/L (ref 22–32)
Calcium: 9.3 mg/dL (ref 8.9–10.3)
Creatinine, Ser: 1.6 mg/dL — ABNORMAL HIGH (ref 0.61–1.24)
GFR calc Af Amer: 45 mL/min — ABNORMAL LOW (ref >60)
GFR calc non Af Amer: 39 mL/min — ABNORMAL LOW (ref >60)
GLUCOSE: 98 mg/dL (ref 65–99)
POTASSIUM: 3.7 mmol/L (ref 3.5–5.1)
Sodium: 141 mmol/L (ref 135–145)

## 2015-06-14 LAB — GLUCOSE, CAPILLARY: Glucose-Capillary: 108 mg/dL — ABNORMAL HIGH (ref 65–99)

## 2015-06-14 MED ORDER — LEVALBUTEROL HCL 0.63 MG/3ML IN NEBU: 0.6300 mg | INHALATION_SOLUTION | Freq: Three times a day (TID) | RESPIRATORY_TRACT | Status: AC

## 2015-06-14 MED ORDER — TEMAZEPAM 7.5 MG PO CAPS: 7.5000 mg | ORAL_CAPSULE | Freq: Every day | ORAL | Status: AC

## 2015-06-14 MED ORDER — ASPIRIN 81 MG PO CHEW
81.0000 mg | CHEWABLE_TABLET | Freq: Every day | ORAL | Status: AC
Start: 2015-06-14 — End: ?

## 2015-06-14 MED ORDER — CARVEDILOL 3.125 MG PO TABS: 9.3750 mg | ORAL_TABLET | Freq: Two times a day (BID) | ORAL | Status: AC

## 2015-06-14 MED ORDER — BISACODYL 10 MG RE SUPP: 10.0000 mg | Freq: Every day | RECTAL | Status: AC | PRN

## 2015-06-14 MED ORDER — DONEPEZIL HCL 10 MG PO TABS: 10.0000 mg | ORAL_TABLET | Freq: Every day | ORAL | Status: AC

## 2015-06-14 MED ORDER — ISOSORBIDE DINITRATE 20 MG PO TABS: 20.0000 mg | ORAL_TABLET | Freq: Two times a day (BID) | ORAL | Status: AC

## 2015-06-14 MED ORDER — DOCUSATE SODIUM 100 MG PO CAPS
100.0000 mg | ORAL_CAPSULE | Freq: Two times a day (BID) | ORAL | Status: AC
Start: 2015-06-14 — End: ?

## 2015-06-14 NOTE — Clinical Social Work Placement (Signed)
   CLINICAL SOCIAL WORK PLACEMENT  NOTE  Date:  06/14/2015  Patient Details  Name: [name] Macias MRN: [id number] Date of Birth: [birth year]  Clinical Social Work is seeking post-discharge placement for this patient at the [place] level of care (*CSW will initial, date and re-position this form in  chart as items are completed):  Yes   Patient/family provided with [place] Work Department's list of facilities offering this level of care within the geographic area requested by the patient (or if unable, by the patient's family).  Yes   Patient/family informed of their freedom to choose among providers that offer the needed level of care, that participate in Medicare, Medicaid or managed care program needed by the patient, have an available bed and are willing to accept the patient.  Yes   Patient/family informed of [place]'s ownership interest in [facility] and [facility], as well as of the fact that they are under no obligation to receive care at these facilities.  PASRR submitted to EDS on       PASRR number received on       Existing PASRR number confirmed on 06/09/15     FL2 transmitted to all facilities in geographic area requested by pt/family on 06/09/15     FL2 transmitted to all facilities within larger geographic area on       Patient informed that his/her managed care company has contracts with or will negotiate with certain facilities, including the following:        Yes   Patient/family informed of bed offers received.  Patient chooses bed at [facility]     Physician recommends and patient chooses bed at      Patient to be transferred to [facility] on 06/14/15.  Patient to be transferred to facility by Sister in law     Patient family notified on 06/14/15 of transfer.  Name of family member notified:  Rita Ohara, son     PHYSICIAN       Additional Comment:     _______________________________________________ [name], LCSW 06/14/2015, 12:43 PM

## 2015-06-14 NOTE — Progress Notes (Signed)
He says he feels substantially better today. He has no new complaints. His abdominal pain is better. His renal function is better. I think he can go to the skilled care facility today

## 2015-06-14 NOTE — Clinical Social Work Note (Signed)
CSW notified [name] at [facility] that patient was being discharged today and would be transported to the facility.   CSW left a voicemail message on patient's home phone for his wife advising that patient would be discharged today.  CSW spoke with patient's son, [name], and advised that patient was being discharged today and would be tranpsorted [facility].  Dr. Javier Glazier advised that he would contact his mother as she was staying at his sister's home due to patient not being in the home.  He stated when he could not come from [facility] to stay with [name], she would be staying with her daughter.   CSW arranged transport.  CSW signing off.   [name], [place] [phone]

## 2015-06-14 NOTE — Care Management Note (Signed)
Case Management Note  Patient Details  Name: [name] Macias MRN: [id number] Date of Birth: 17-Apr-1932  Subjective/Objective:                    Action/Plan:Discharging today to [facility] in [place], [place] following.  Expected Discharge Date:                  Expected Discharge Plan:  Skilled Nursing Facility  In-House Referral:  Clinical Social Work  Discharge planning Services  CM Consult  Post Acute Care Choice:  NA Choice offered to:  NA  DME Arranged:    DME Agency:     HH Arranged:    [place] Agency:     Status of Service:  In process, will continue to follow  Medicare Important Message Given:  Yes Date Medicare IM Given:    Medicare IM give by:    Date Additional Medicare IM Given:    Additional Medicare Important Message give by:     If discussed at [place] of Stay Meetings, dates discussed:    Additional Comments:  Alvie Heidelberg, RN 06/14/2015, 9:26 AM

## 2015-06-14 NOTE — Progress Notes (Signed)
Physical Therapy Treatment Patient Details Name: [name] Macias MRN: [id number] DOB: [birth year] Today's Date: 06/14/2015    History of Present Illness : [name] Macias is a 82 y.o. male with a history of nonobstructive CAD, nonischemic cardiomyopathy with a recent EF of 20-25% per echo [id number], grade 2 diastolic dysfunction, AICD in situ, diverticular bleed in 10/2014, chronic kidney disease, prostate cancer, and mild dementia. He presents to the ED with a chief complaint of generalized weakness. The history is provided by both the patient and his sister-in-law, [name]. Accordingly, the patient was recently treated for a cough and presumed bronchitis by his PCP a couple weeks ago. He was given Z-Pak 2 and cough medicine. Since that time, he has had progressive generalized weakness, poor appetite, and several nontraumatic falls at home. He becomes lightheaded and disoriented at times and falls. He denies outright loss of consciousness. He denies worsening joint pain other than DJD pain he has chronically. He denies focal weakness, difficulty swallowing, or difficulty speaking. He describes shortness of breath at rest when laying flat and with activity. He denies outright chest pain. He has intermittent swelling of his legs, but not more than usual. He denies fever over the past few days. He reports one episode of vomiting yesterday. He says that his abdomen hurts all the time, no worst unusual. His last bowel movement was yesterday and without blood. He denies diarrhea. He denies pain with urination. He says that he has been taking his medications as prescribed without missing.    PT Comments    Pt received in bed, family present, and pt was agreeable to PT tx.  Pt demonstrated supine<>sit with Mod (I), and ambulated 88ft with RW and Min guard.  Recomnmend SNF due to need for increased level of assistance and to progress pt's strength, and endurance.     Follow Up Recommendations  SNF     Equipment  Recommendations  None recommended by PT    Recommendations for Other Services       Precautions / Restrictions Precautions Precautions: Fall Restrictions Weight Bearing Restrictions: No    Mobility  Bed Mobility Overal bed mobility: Modified Independent                Transfers Overall transfer level: Needs assistance Equipment used: Rolling walker (2 wheeled) Transfers: Sit to/from Stand Sit to Stand: Min assist         General transfer comment: Pt demonstrated safe hand placement today, however upon pushing up, he did have 1 LOB, which required Min A from therapist to prevent him from going back onto the bed.   Ambulation/Gait Ambulation/Gait assistance: Min guard Ambulation Distance (Feet): 20 Feet Assistive device: Rolling walker (2 wheeled) Gait Pattern/deviations: Step-through pattern     General Gait Details: B shortened strides.     Stairs            Wheelchair Mobility    Modified Rankin (Stroke Patients Only)       Balance           Standing balance support: Bilateral upper extremity supported Standing balance-Leahy Scale: Fair                      Cognition Arousal/Alertness: Awake/alert Behavior During Therapy: WFL for tasks assessed/performed Overall Cognitive Status: Within Functional Limits for tasks assessed                      Exercises      General  Comments        Pertinent Vitals/Pain Pain Assessment: No/denies pain    Home Living                      Prior Function            PT Goals (current goals can now be found in the care plan section) Progress towards PT goals: Progressing toward goals    Frequency       PT Plan Current plan remains appropriate    Co-evaluation             End of Session Equipment Utilized During Treatment: Gait belt Activity Tolerance: Patient tolerated treatment well Patient left: in chair;with call bell/phone within reach;with chair  alarm set;with nursing/sitter in room;with family/visitor present     Time: 1125-1136 PT Time Calculation (min) (ACUTE ONLY): 11 min  Charges:  $Gait Training: 8-22 mins                    G Codes:  Functional Assessment Tool Used: [employer] "6-clicks"  Functional Limitation: Mobility: Walking and moving around Mobility: Walking and Moving Around Current Status [phone]): At least 40 percent but less than 60 percent impaired, limited or restricted Mobility: Walking and Moving Around Goal Status [phone]): At least 20 percent but less than 40 percent impaired, limited or restricted   Eustaquio Maize Tiernan Suto, PT, DPT X: [id number]   06/14/2015, 11:42 AM

## 2015-06-14 NOTE — Discharge Summary (Addendum)
Physician Discharge Summary  Patient ID: [name] Macias MRN: [id number] DOB/AGE: 1932-02-13 82 y.o. Primary Care Physician:Samuel Domenic Polite, MD Admit date: 06/07/2015 Discharge date: 06/14/2015    Discharge Diagnoses:   Principal Problem:   Acute on chronic systolic CHF (congestive heart failure) (HCC) Active Problems:   Automatic implantable cardioverter-defibrillator in situ   Prediabetes   Essential hypertension   Stage III chronic kidney disease   COPD with chronic bronchitis (HCC)   Elevated troponin I level   Generalized weakness   Multiple falls   Dementia   Chronic respiratory failure with hypoxia (HCC)   Goals of care, counseling/discussion   Palliative care encounter   DNR (do not resuscitate) discussion     Medication List    STOP taking these medications        oxyCODONE-acetaminophen 5-325 MG tablet  Commonly known as:  PERCOCET/ROXICET      TAKE these medications        acetaminophen 500 MG tablet  Commonly known as:  TYLENOL  Take 1,000 mg by mouth daily as needed for moderate pain.     allopurinol 100 MG tablet  Commonly known as:  ZYLOPRIM  Take 1 tablet (100 mg total) by mouth daily.     ALPRAZolam 0.25 MG tablet  Commonly known as:  XANAX  Take 0.25 mg by mouth 2 (two) times daily as needed for anxiety.     aspirin 81 MG chewable tablet  Chew 1 tablet (81 mg total) by mouth daily.     bisacodyl 10 MG suppository  Commonly known as:  DULCOLAX  Place 1 suppository (10 mg total) rectally daily as needed for moderate constipation.     carvedilol 3.125 MG tablet  Commonly known as:  COREG  Take 3 tablets (9.375 mg total) by mouth 2 (two) times daily with a meal.     docusate sodium 100 MG capsule  Commonly known as:  COLACE  Take 1 capsule (100 mg total) by mouth 2 (two) times daily.     donepezil 10 MG tablet  Commonly known as:  ARICEPT  Take 1 tablet (10 mg total) by mouth at bedtime.     ferrous sulfate 325 (65 FE) MG tablet   Commonly known as:  FERROUSUL  Take 1 tablet (325 mg total) by mouth daily with breakfast.     furosemide 40 MG tablet  Commonly known as:  LASIX  Take 1 tablet (40 mg total) by mouth 2 (two) times daily.     HYDROcodone-acetaminophen 10-325 MG tablet  Commonly known as:  NORCO  Take 1 tablet by mouth every 6 (six) hours as needed for moderate pain.     isosorbide dinitrate 20 MG tablet  Commonly known as:  ISORDIL  Take 1 tablet (20 mg total) by mouth 2 (two) times daily.     levalbuterol 0.63 MG/3ML nebulizer solution  Commonly known as:  XOPENEX  Take 3 mLs (0.63 mg total) by nebulization 3 (three) times daily.     losartan 25 MG tablet  Commonly known as:  COZAAR  Take 1 tablet (25 mg total) by mouth daily.     nitroGLYCERIN 0.4 MG SL tablet  Commonly known as:  NITROSTAT  Place 1 tablet (0.4 mg total) under the tongue every 5 (five) minutes as needed for chest pain. For chest pain     OXYGEN  Inhale 2 L into the lungs at bedtime. May use daily as needed     pantoprazole 40 MG tablet  Commonly known  as:  PROTONIX  Take 40 mg by mouth daily. Reported on 04/06/2015     sertraline 50 MG tablet  Commonly known as:  ZOLOFT  Take 50 mg by mouth daily.     spironolactone 25 MG tablet  Commonly known as:  ALDACTONE  Take 1 tablet (25 mg total) by mouth daily.     temazepam 7.5 MG capsule  Commonly known as:  RESTORIL  Take 1 capsule (7.5 mg total) by mouth at bedtime.        Discharged Condition:Improved    Consults: Cardiology/palliative care  Significant Diagnostic Studies: Ct Head Wo Contrast  06/07/2015  CLINICAL DATA:  Generalized weakness EXAM: CT HEAD WITHOUT CONTRAST TECHNIQUE: Contiguous axial images were obtained from the base of the skull through the vertex without intravenous contrast. COMPARISON:  03/17/2015 FINDINGS: Bony calvarium is intact. No gross soft tissue abnormality is noted. Mild atrophic changes are again identified. Chronic white matter  ischemic change is seen as well. No findings to suggest acute hemorrhage, acute infarction or space-occupying mass lesion are noted. IMPRESSION: Chronic atrophic and ischemic changes without acute intracranial abnormality. Electronically Signed   By: Inez Catalina M.D.   On: 06/07/2015 12:46   Dg Chest Portable 1 View  06/07/2015  CLINICAL DATA:  Generalized weakness, nonproductive cough for 3 days EXAM: PORTABLE CHEST 1 VIEW COMPARISON:  03/17/2015 and 12/10/2014 FINDINGS: Cardiomegaly is noted. Central mild vascular congestion and mild perihilar interstitial prominence suspicious for minimal interstitial edema. Mild left basilar atelectasis. The patient is slight rotated to the right. 4 leads cardiac pacemaker is unchanged in position. Atherosclerotic calcifications of thoracic aorta. IMPRESSION: Cardiomegaly. Central mild vascular congestion and minimal perihilar interstitial prominence suspicious for mild interstitial edema. Mild basilar atelectasis. Four leads cardiac pacemaker in place. Electronically Signed   By: Lahoma Crocker M.D.   On: 06/07/2015 08:17    Lab Results: Basic Metabolic Panel:  Recent Labs  06/13/15 0642 06/14/15 0426  NA 142 141  K 4.0 3.7  CL 106 102  CO2 29 31  GLUCOSE 102* 98  BUN 61* 62*  CREATININE 1.79* 1.60*  CALCIUM 9.5 9.3   Liver Function Tests: No results for input(s): AST, ALT, ALKPHOS, BILITOT, PROT, ALBUMIN in the last 72 hours.   CBC: No results for input(s): WBC, NEUTROABS, HGB, HCT, MCV, PLT in the last 72 hours.  Recent Results (from the past 240 hour(s))  Urine culture     Status: None   Collection Time: 06/07/15  8:55 AM  Result Value Ref Range Status   Specimen Description URINE, CATHETERIZED  Final   Special Requests NONE  Final   Culture NO GROWTH Performed at [facility]   Final   Report Status 06/09/2015 FINAL  Final  MRSA PCR Screening     Status: None   Collection Time: 06/07/15  1:55 PM  Result Value Ref Range Status    MRSA by PCR NEGATIVE NEGATIVE Final    Comment:        The GeneXpert MRSA Assay (FDA approved for NASAL specimens only), is one component of a comprehensive MRSA colonization surveillance program. It is not intended to diagnose MRSA infection nor to guide or monitor treatment for MRSA infections.      Hospital Course: This is an 82 year old who came to the hospital with acute on chronic heart failure. He was initially admitted to the intensive care unit was treated with intravenous Lasix. He had acute on chronic renal failure during his hospitalization but that  has improved. He has chronic abdominal pain thought to be related to mesenteric ischemia and his nitrates were increased and he is better with that. He had PT evaluation and it was felt that he was going to require skilled care facility for rehabilitation. He is much improved not short of breath now no edema renal function has improved and he is ready for discharge  Discharge Exam: Blood pressure 104/66, pulse 86, temperature 97.1 F (36.2 C), temperature source Oral, resp. rate 18, height 5\' 6"  (1.676 m), weight 65 kg (143 lb 4.8 oz), SpO2 95 %. He is awake and alert. His chest is clear. He has no edema.  Disposition: To skilled care facility for rehabilitation. He will need PT OT and speech if needed. He will be on heart healthy diet. He will be on oxygen at 2 L and this can be adjusted depending on his oxygen saturation. He will need to have daily weights while at the facility and increase in dose of his furosemide if his weight goes up as much is 3 pounds in a day      Discharge Instructions    Discharge to SNF when bed available    Complete by:  As directed              Signed: Eleyna Brugh L   06/14/2015, 9:08 AM

## 2015-06-14 NOTE — Progress Notes (Signed)
Pt to Discharge to [facility] in [place].  Pt IV, foley and telemetry removed, tolerated well.

## 2015-06-14 NOTE — Clinical Social Work Note (Signed)
CSW received patient's insurance authorization from [facility], [phone]). Patient is authorized for seven days, Josem Kaufmann number is [id number]. CSW provided auth information to [place] at [employer].    Deuntae Kocsis, Clydene Pugh, LCSW

## 2015-06-14 NOTE — Progress Notes (Signed)
Report given to [name] Macias at the [facility], [place].  Answered all questions at this time.  Family will transport pt to facility.

## 2015-06-14 NOTE — Progress Notes (Addendum)
Subjective:  Feeling better  Objective:  Vital Signs in the last 24 hours: Temp:  [97.1 F (36.2 C)-98.2 F (36.8 C)] 97.1 F (36.2 C) (05/31 0517) Pulse Rate:  [83-92] 86 (05/31 0517) Resp:  [16-18] 18 (05/31 0517) BP: (104-112)/(64-76) 104/66 mmHg (05/31 0517) SpO2:  [92 %-97 %] 95 % (05/31 0745) Weight:  [143 lb 4.8 oz (65 kg)-146 lb 13.2 oz (66.6 kg)] 143 lb 4.8 oz (65 kg) (05/31 0636)  Intake/Output from previous day: 05/30 0701 - 05/31 0700 In: 483 [P.O.:480; I.V.:3] Out: 2650 [Urine:2650] Intake/Output from this shift:    Physical Exam: NECK: slight increase JVD, HJR, no bruit LUNGS: Decreased breath sounds but Clear anterior, posterior, lateral HEART: Regular rate and rhythm, no murmur, gallop, rub, bruit, thrill, or heave EXTREMITIES: Without cyanosis, clubbing, or edema   Lab Results: No results for input(s): WBC, HGB, PLT in the last 72 hours.  Recent Labs  06/13/15 0642 06/14/15 0426  NA 142 141  K 4.0 3.7  CL 106 102  CO2 29 31  GLUCOSE 102* 98  BUN 61* 62*  CREATININE 1.79* 1.60*   No results for input(s): TROPONINI in the last 72 hours.  Invalid input(s): CK, MB Hepatic Function Panel No results for input(s): PROT, ALBUMIN, AST, ALT, ALKPHOS, BILITOT, BILIDIR, IBILI in the last 72 hours. No results for input(s): CHOL in the last 72 hours. No results for input(s): PROTIME in the last 72 hours.    Cardiac Studies:  Assessment/Plan:  1. Acute on chronic systolic HF - [id number] echo LVEF 20-25%, diffuse hypokinesis, grade II diastolic dysfunction, mod AI, mild to mod MR, PASP 66 - negative 2.1 liter yesterday, negative 5.9 liters since admission.  lasix increased yest 40mg  IV tid with excellent diuresis. Downtrend in Cr and BUN consistent with venous congestion and CHF. Continue IV diuretics today - other medical therapy with coreg. No ACE/ARB/Aldactone given poor renal function.   . Agree with palliative care involvement, overall advanced heart  failure poor candidate for advanced therapies. Hopefully with further diuresis symptoms will improve in the short term, long term prognosis overall remains poor.    Being discharged today to skilled nursing facility. Was on lasix 40 mg BID prior to admission. F/u with Dr. Hall Busing. Device check in 2 months.   2. ICD-just checked by Dr. Lovena Le 04/2015  3. Hx GI bleeding  HTN  LOS: 7 days    [name] Macias 06/14/2015, 9:02 AM   Patient seen and discussed with NP Bonnell Public, I agree with her documentation. Negative 2.1 liters yesterday, negative 6 liters since admission. Cr and BUN continue to trend down with diuresis consistent with venous congestion and CHF. He is on coreg, no ACE/ARB/Aldactone given poor renal function. There are plans for discharge today, would recommend lasix oral 60mg  bid.   Zandra Abts MD

## 2015-06-14 NOTE — Care Management Important Message (Signed)
Important Message  Patient Details  Name: [name] Macias MRN: [id number] Date of Birth: 11-Sep-1932   Medicare Important Message Given:  Yes    Alvie Heidelberg, RN 06/14/2015, 10:38 AM

## 2015-11-16 ENCOUNTER — Other Ambulatory Visit (HOSPITAL_COMMUNITY): Payer: Self-pay | Admitting: Pulmonary Disease

## 2015-11-16 ENCOUNTER — Encounter (HOSPITAL_COMMUNITY): Payer: Self-pay

## 2015-11-16 ENCOUNTER — Ambulatory Visit (HOSPITAL_COMMUNITY)
Admission: RE | Admit: 2015-11-16 | Discharge: 2015-11-16 | Disposition: A | Discharge status: Home / Self Care | Payer: Commercial Managed Care - HMO | Source: Ambulatory Visit | Attending: Pulmonary Disease | Admitting: Pulmonary Disease

## 2015-11-16 DIAGNOSIS — R29810 Facial weakness: Secondary | ICD-10-CM | POA: Insufficient documentation

## 2015-12-29 ENCOUNTER — Telehealth: Payer: Self-pay | Admitting: Cardiology

## 2015-12-29 NOTE — Telephone Encounter (Signed)
Spoke with Kateri Mc, nurse at [facility] [place].  She gave a verbal order to disable ICD tachy therapies from Dr. Sinda Du.  Tina's cell number is [phone], office number [phone] (use after 4pm).  Cleora Fleet that if patient's death is imminent, they should apply a magnet to inhibit detection/therapies.  Tina verbalizes understanding.  Advised I will make [employer] rep aware and she will contact Tina to set up date and time to reprogram device.  Otila Kluver is appreciative and denies additional questions or concerns at this time.  Tomi Bamberger, [employer] rep, aware of order.

## 2015-12-29 NOTE — Telephone Encounter (Signed)
Pt son called and stated that pt is now on hospice and he wants his device turned off. Pt son wanted to know what he needs to do to have the ICD turned off. I informed him that the hospice needs to send an order to have the device turned off. Pt son said he would have the hospice nurse call and send the order.

## 2016-01-01 NOTE — Telephone Encounter (Signed)
Spoke with Tomi Bamberger, [employer] rep.  Plan to disable therapies today at 1:00pm.  Noted in [place].  Routed to Dr. Lovena Le for review.

## 2016-02-15 DEATH — deceased

## 2017-07-03 IMAGING — CT CT HEAD W/O CM
2 series · 16 of 30 positions shown, 20 images · non-contrast
Comparison: 03/17/2015

CLINICAL DATA: Generalized weakness

EXAM:
CT HEAD WITHOUT CONTRAST
TECHNIQUE: Contiguous axial images were obtained from the base of the skull
through the vertex without intravenous contrast.

[Series 2: head w/o · axial · non-contrast · 0.42mm/px · z∈[+44,+180]mm · 13 of 33 slices shown, 17 images]
[im 3/33  brain]
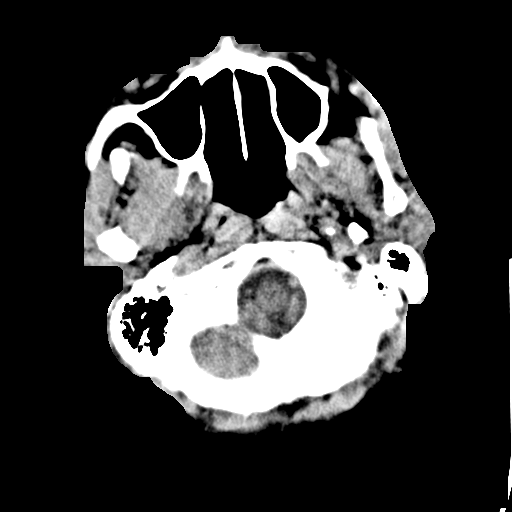
[im 3/33  bone]
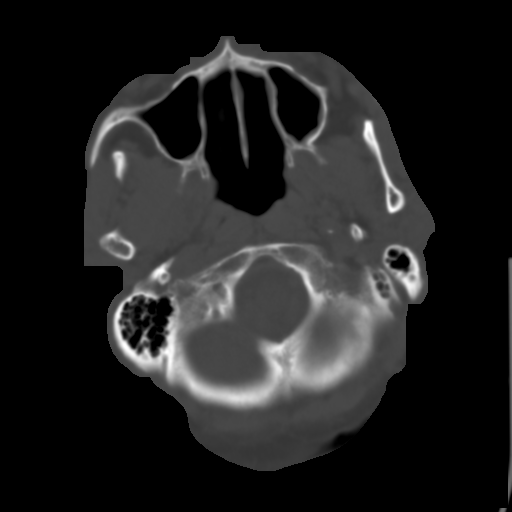
[im 5/33  brain]
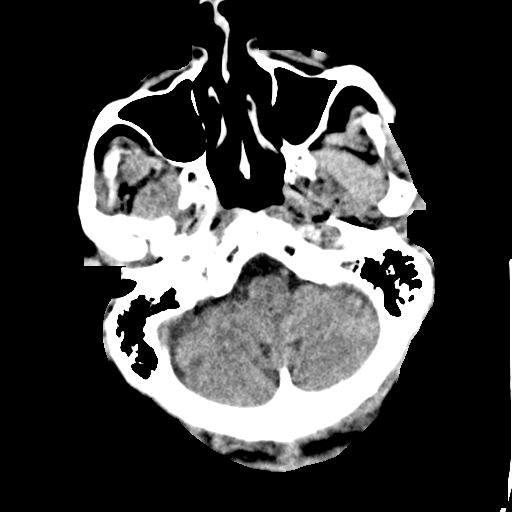
[im 7/33  brain]
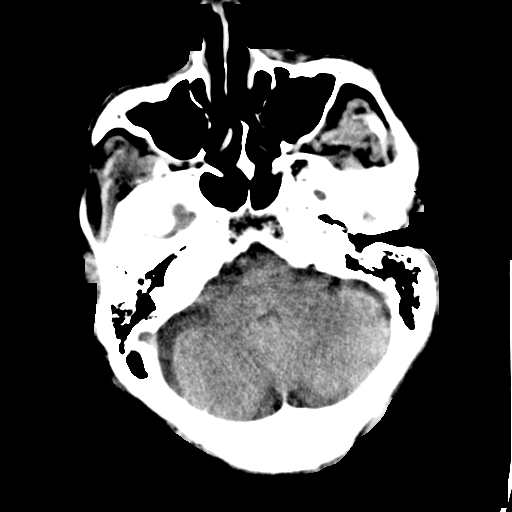
[im 10/33  brain]
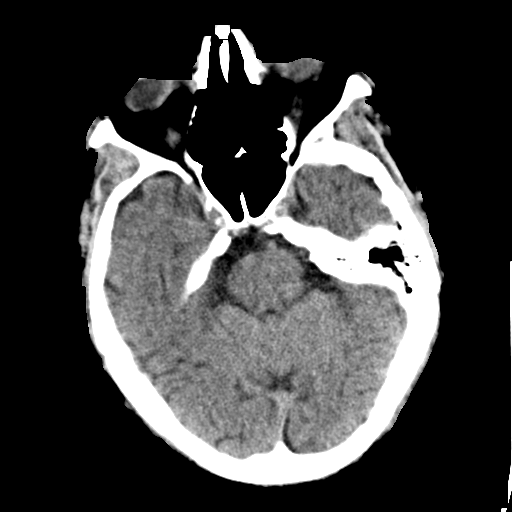
[im 12/33  brain]
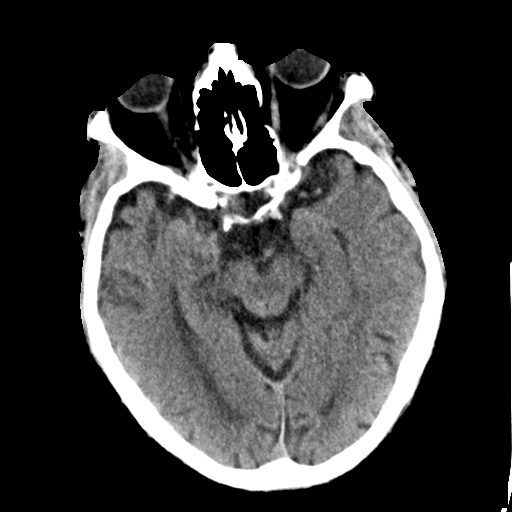
[im 12/33  bone]
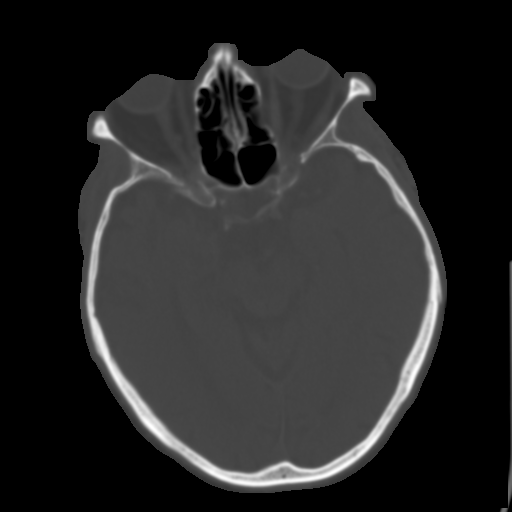
[im 14/33  brain]
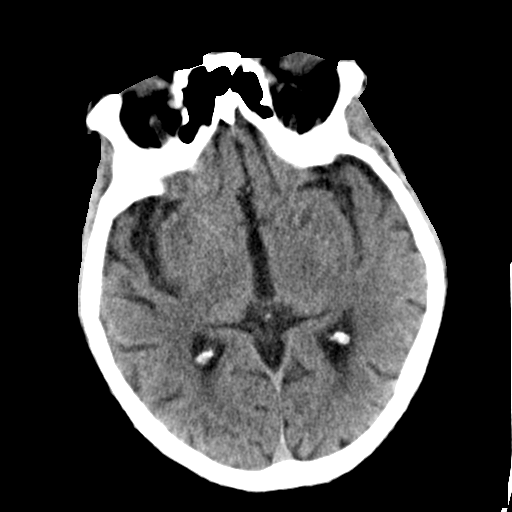
[im 17/33  brain]
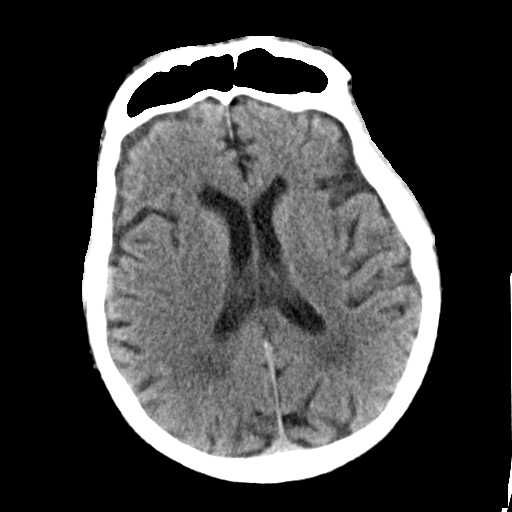
[im 19/33  brain]
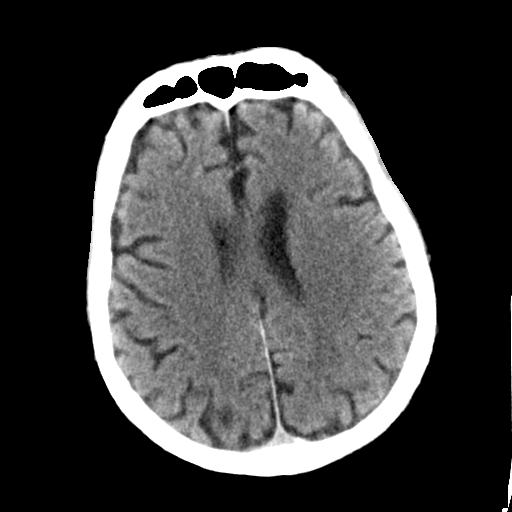
[im 21/33  brain]
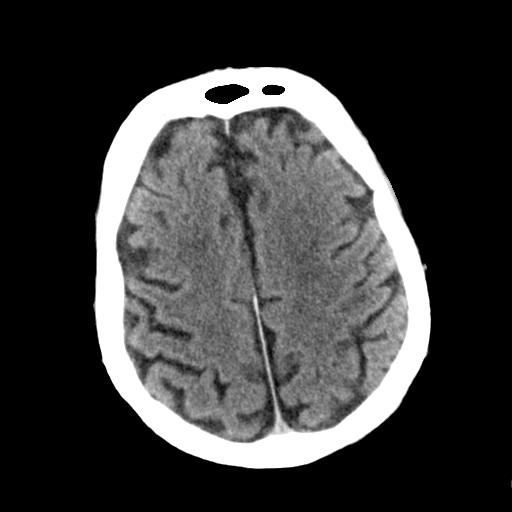
[im 21/33  bone]
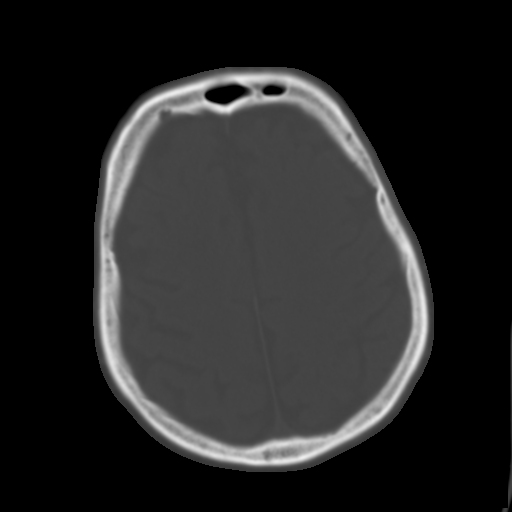
[im 23/33  brain]
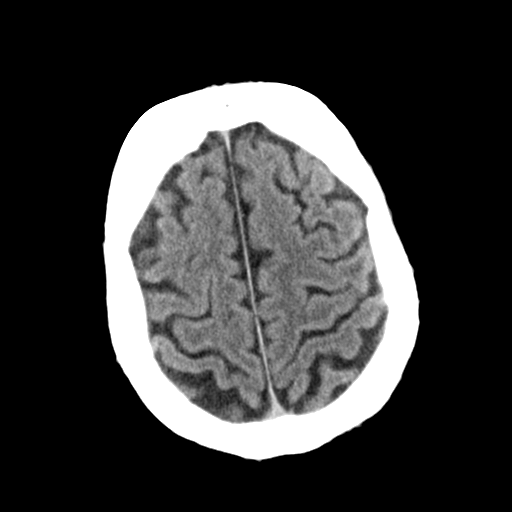
[im 26/33  brain]
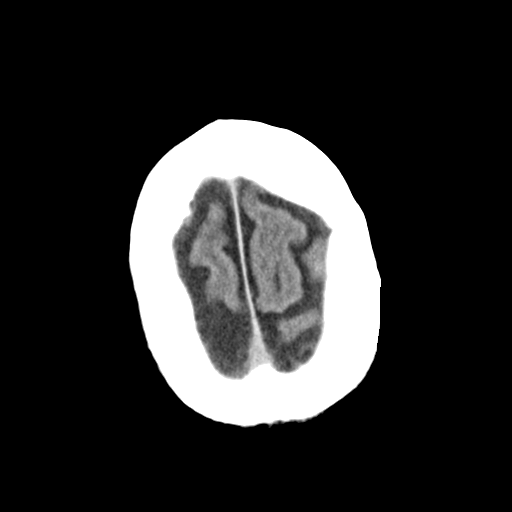
[im 28/33  brain]
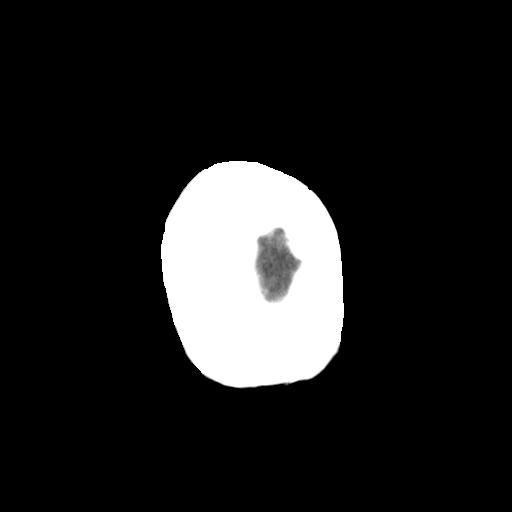
[im 30/33  brain]
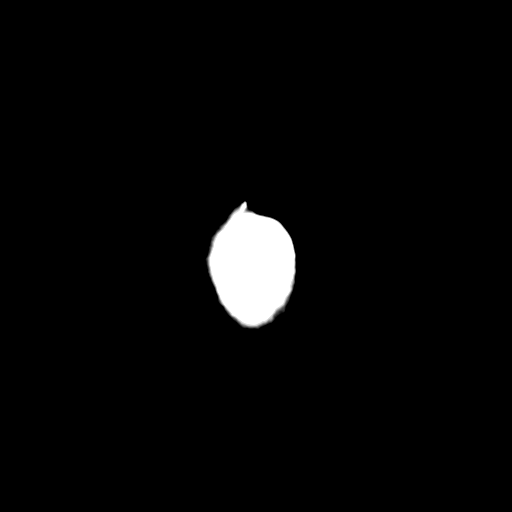
[im 30/33  bone]
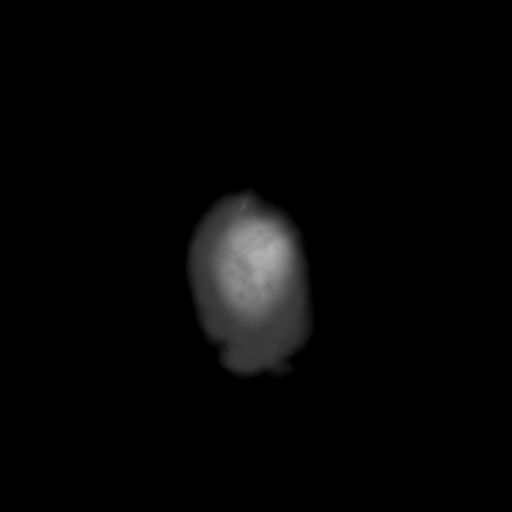

[Series 3: head bone · axial · 0.42mm/px · z∈[+44,+90]mm · 3 of 33 slices shown]
[im 3/33  bone]
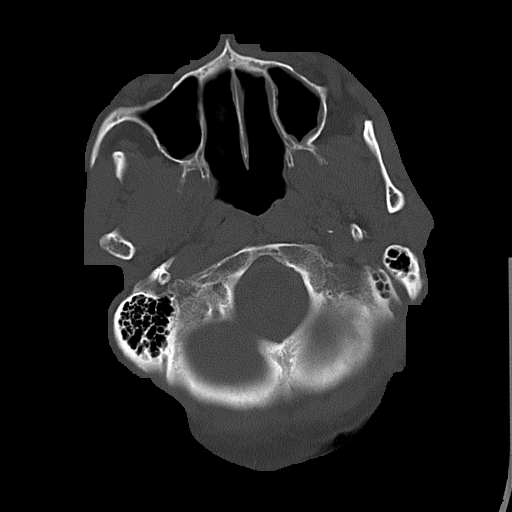
[im 7/33  bone]
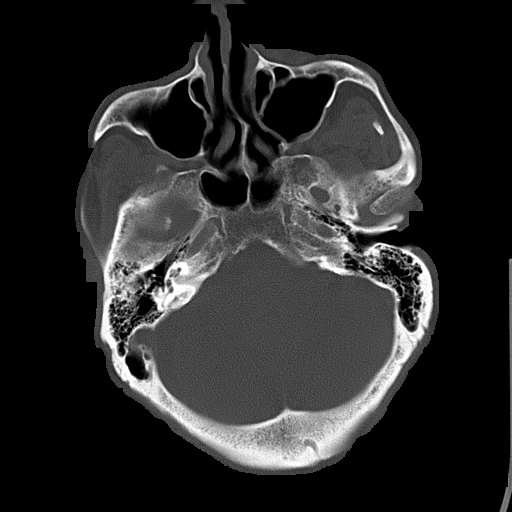
[im 12/33  bone]
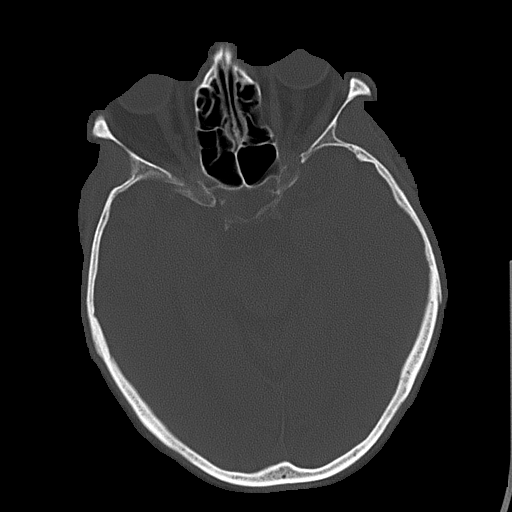

[16 of 30 positions shown; findings below may reference images not displayed]

FINDINGS: Bony calvarium is intact. No gross soft tissue abnormality is noted.
Mild atrophic changes are again identified. Chronic white matter
ischemic change is seen as well. No findings to suggest acute
hemorrhage, acute infarction or space-occupying mass lesion are
noted.
IMPRESSION: Chronic atrophic and ischemic changes without acute intracranial
abnormality.
